# Patient Record
Sex: Female | Born: 1960 | Race: Black or African American | Hispanic: No | Marital: Single | Smoking: Light tobacco smoker
Health system: Southern US, Community
[De-identification: ages and names within clinical notes are randomized; demographics above are authoritative.]

## PROBLEM LIST (undated history)

## (undated) DIAGNOSIS — G8929 Other chronic pain: Secondary | ICD-10-CM

## (undated) DIAGNOSIS — M1990 Unspecified osteoarthritis, unspecified site: Secondary | ICD-10-CM

## (undated) DIAGNOSIS — M5136 Other intervertebral disc degeneration, lumbar region: Secondary | ICD-10-CM

## (undated) DIAGNOSIS — I10 Essential (primary) hypertension: Secondary | ICD-10-CM

## (undated) DIAGNOSIS — E119 Type 2 diabetes mellitus without complications: Secondary | ICD-10-CM

## (undated) DIAGNOSIS — K859 Acute pancreatitis without necrosis or infection, unspecified: Secondary | ICD-10-CM

## (undated) DIAGNOSIS — R350 Frequency of micturition: Secondary | ICD-10-CM

## (undated) DIAGNOSIS — G4730 Sleep apnea, unspecified: Secondary | ICD-10-CM

## (undated) DIAGNOSIS — F419 Anxiety disorder, unspecified: Secondary | ICD-10-CM

## (undated) DIAGNOSIS — K759 Inflammatory liver disease, unspecified: Secondary | ICD-10-CM

## (undated) DIAGNOSIS — I2510 Atherosclerotic heart disease of native coronary artery without angina pectoris: Secondary | ICD-10-CM

## (undated) DIAGNOSIS — E079 Disorder of thyroid, unspecified: Secondary | ICD-10-CM

## (undated) DIAGNOSIS — R32 Unspecified urinary incontinence: Secondary | ICD-10-CM

## (undated) DIAGNOSIS — F32A Depression, unspecified: Secondary | ICD-10-CM

## (undated) DIAGNOSIS — R079 Chest pain, unspecified: Secondary | ICD-10-CM

## (undated) DIAGNOSIS — J449 Chronic obstructive pulmonary disease, unspecified: Secondary | ICD-10-CM

## (undated) DIAGNOSIS — R600 Localized edema: Secondary | ICD-10-CM

## (undated) DIAGNOSIS — R011 Cardiac murmur, unspecified: Secondary | ICD-10-CM

## (undated) DIAGNOSIS — Q210 Ventricular septal defect: Secondary | ICD-10-CM

## (undated) DIAGNOSIS — A6000 Herpesviral infection of urogenital system, unspecified: Secondary | ICD-10-CM

## (undated) DIAGNOSIS — E785 Hyperlipidemia, unspecified: Secondary | ICD-10-CM

## (undated) DIAGNOSIS — R12 Heartburn: Secondary | ICD-10-CM

## (undated) DIAGNOSIS — F329 Major depressive disorder, single episode, unspecified: Secondary | ICD-10-CM

## (undated) DIAGNOSIS — R3129 Other microscopic hematuria: Secondary | ICD-10-CM

## (undated) DIAGNOSIS — I519 Heart disease, unspecified: Secondary | ICD-10-CM

## (undated) DIAGNOSIS — E11319 Type 2 diabetes mellitus with unspecified diabetic retinopathy without macular edema: Secondary | ICD-10-CM

## (undated) DIAGNOSIS — G629 Polyneuropathy, unspecified: Secondary | ICD-10-CM

## (undated) DIAGNOSIS — M549 Dorsalgia, unspecified: Secondary | ICD-10-CM

## (undated) DIAGNOSIS — M51369 Other intervertebral disc degeneration, lumbar region without mention of lumbar back pain or lower extremity pain: Secondary | ICD-10-CM

## (undated) DIAGNOSIS — N189 Chronic kidney disease, unspecified: Secondary | ICD-10-CM

## (undated) DIAGNOSIS — L0100 Impetigo, unspecified: Secondary | ICD-10-CM

## (undated) DIAGNOSIS — J45909 Unspecified asthma, uncomplicated: Secondary | ICD-10-CM

## (undated) DIAGNOSIS — Z9981 Dependence on supplemental oxygen: Secondary | ICD-10-CM

## (undated) HISTORY — DX: Heartburn: R12

## (undated) HISTORY — DX: Polyneuropathy, unspecified: G62.9

## (undated) HISTORY — DX: Disorder of thyroid, unspecified: E07.9

## (undated) HISTORY — DX: Cardiac murmur, unspecified: R01.1

## (undated) HISTORY — DX: Chronic kidney disease, unspecified: N18.9

## (undated) HISTORY — DX: Inflammatory liver disease, unspecified: K75.9

## (undated) HISTORY — DX: Depression, unspecified: F32.A

## (undated) HISTORY — PX: CARDIAC SURGERY: SHX584

## (undated) HISTORY — DX: Chest pain, unspecified: R07.9

## (undated) HISTORY — DX: Hyperlipidemia, unspecified: E78.5

## (undated) HISTORY — DX: Unspecified urinary incontinence: R32

## (undated) HISTORY — DX: Anxiety disorder, unspecified: F41.9

## (undated) HISTORY — DX: Other intervertebral disc degeneration, lumbar region: M51.36

## (undated) HISTORY — PX: WRIST SURGERY: SHX841

## (undated) HISTORY — DX: Type 2 diabetes mellitus without complications: E11.9

## (undated) HISTORY — DX: Other intervertebral disc degeneration, lumbar region without mention of lumbar back pain or lower extremity pain: M51.369

## (undated) HISTORY — DX: Ventricular septal defect: Q21.0

## (undated) HISTORY — DX: Major depressive disorder, single episode, unspecified: F32.9

## (undated) HISTORY — PX: CYST EXCISION: SHX5701

## (undated) HISTORY — DX: Impetigo, unspecified: L01.00

## (undated) HISTORY — DX: Heart disease, unspecified: I51.9

## (undated) HISTORY — DX: Localized edema: R60.0

## (undated) HISTORY — DX: Morbid (severe) obesity due to excess calories: E66.01

## (undated) HISTORY — DX: Dorsalgia, unspecified: M54.9

## (undated) HISTORY — DX: Other microscopic hematuria: R31.29

## (undated) HISTORY — DX: Frequency of micturition: R35.0

## (undated) HISTORY — DX: Unspecified osteoarthritis, unspecified site: M19.90

## (undated) HISTORY — PX: LUMBAR DISC SURGERY: SHX700

## (undated) HISTORY — DX: Herpesviral infection of urogenital system, unspecified: A60.00

## (undated) HISTORY — DX: Other chronic pain: G89.29

## (undated) HISTORY — DX: Type 2 diabetes mellitus with unspecified diabetic retinopathy without macular edema: E11.319

## (undated) HISTORY — DX: Atherosclerotic heart disease of native coronary artery without angina pectoris: I25.10

---

## 2001-06-01 DIAGNOSIS — I10 Essential (primary) hypertension: Secondary | ICD-10-CM | POA: Insufficient documentation

## 2003-10-05 ENCOUNTER — Other Ambulatory Visit: Payer: Self-pay

## 2003-11-02 ENCOUNTER — Other Ambulatory Visit: Payer: Self-pay

## 2003-11-03 ENCOUNTER — Other Ambulatory Visit: Payer: Self-pay

## 2004-02-09 ENCOUNTER — Emergency Department: Payer: Self-pay | Admitting: Emergency Medicine

## 2004-02-09 IMAGING — CT CT HEAD WITHOUT CONTRAST
1 series · 16 of 27 positions shown, 20 images · non-contrast
Comparison: none

REASON FOR EXAM: Headache
COMMENTS:

[Series 2: without · axial · non-contrast · 0.38mm/px · z∈[+830,+950]mm · 16 of 27 slices shown, 20 images]
[im 2/27  brain]
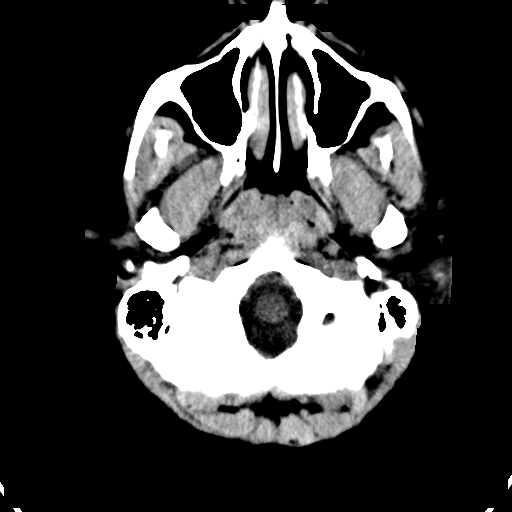
[im 2/27  bone]
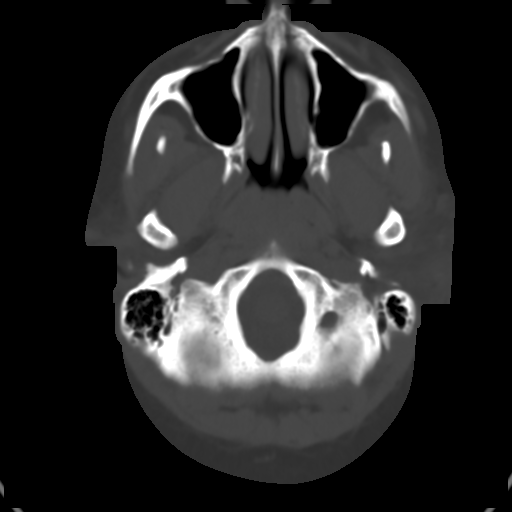
[im 4/27  brain]
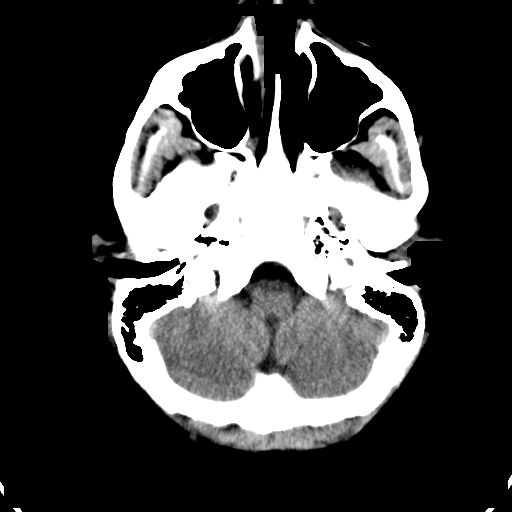
[im 5/27  brain]
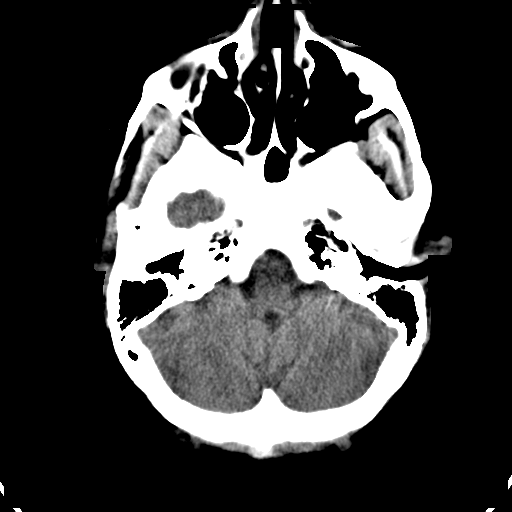
[im 7/27  brain]
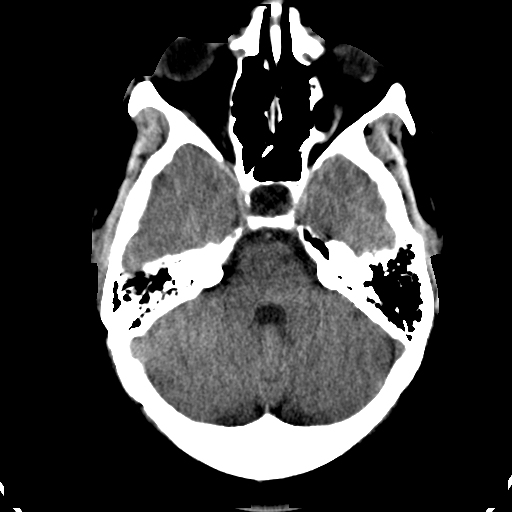
[im 9/27  brain]
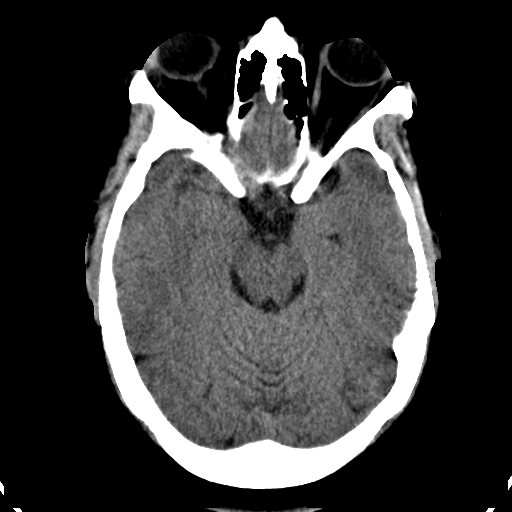
[im 9/27  bone]
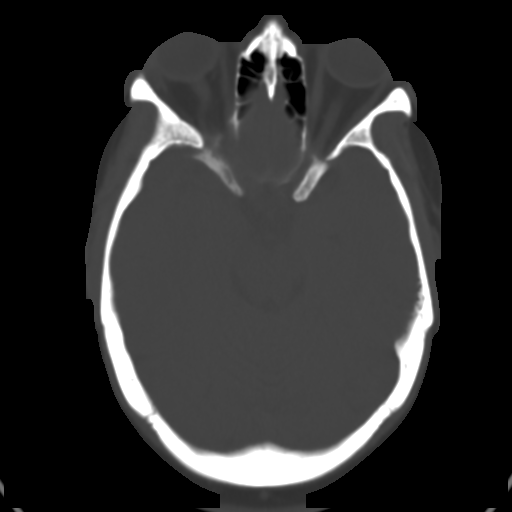
[im 10/27  brain]
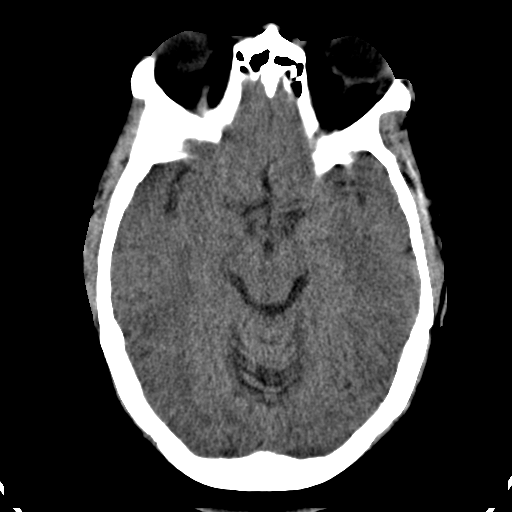
[im 12/27  brain]
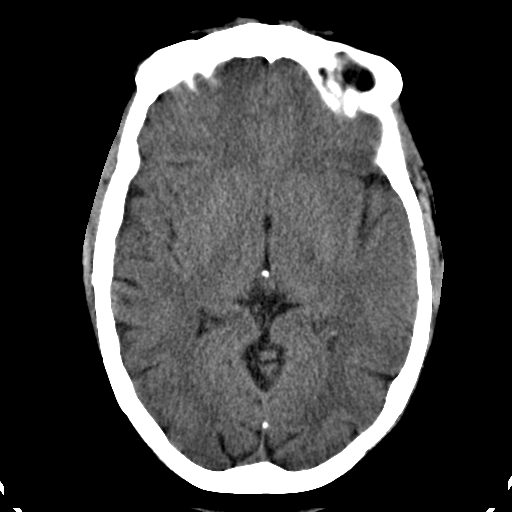
[im 13/27  brain]
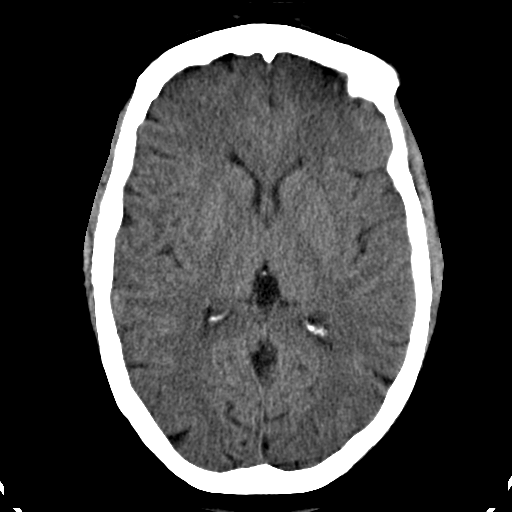
[im 15/27  brain]
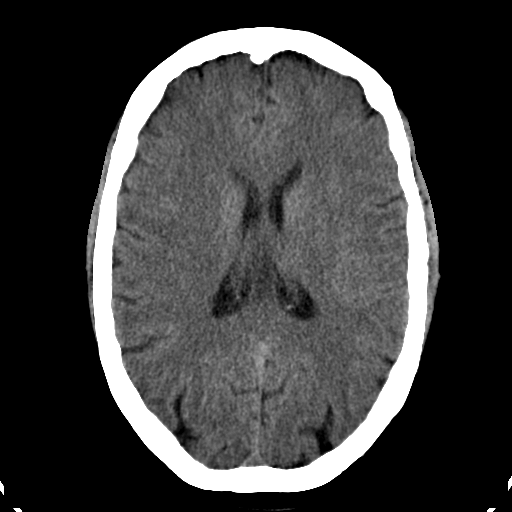
[im 15/27  bone]
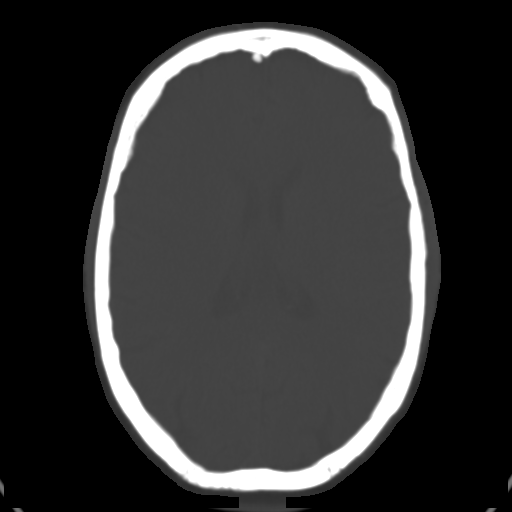
[im 16/27  brain]
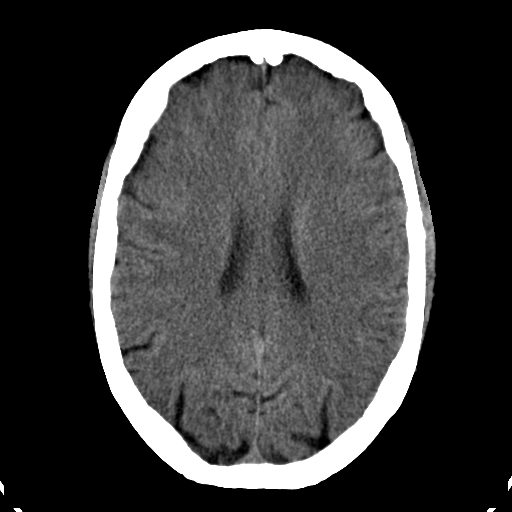
[im 18/27  brain]
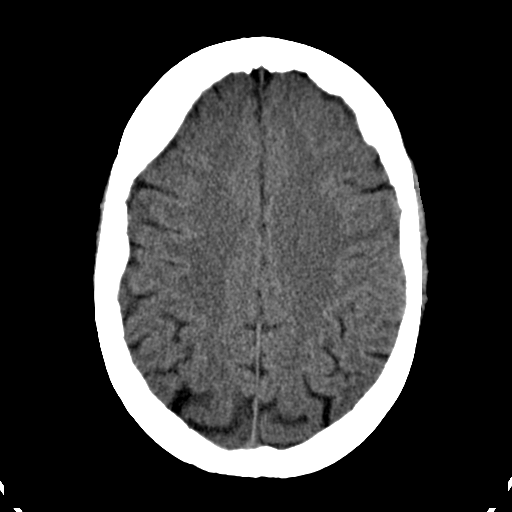
[im 19/27  brain]
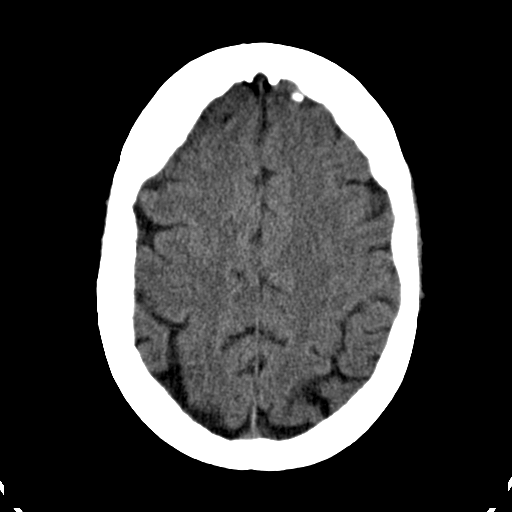
[im 21/27  brain]
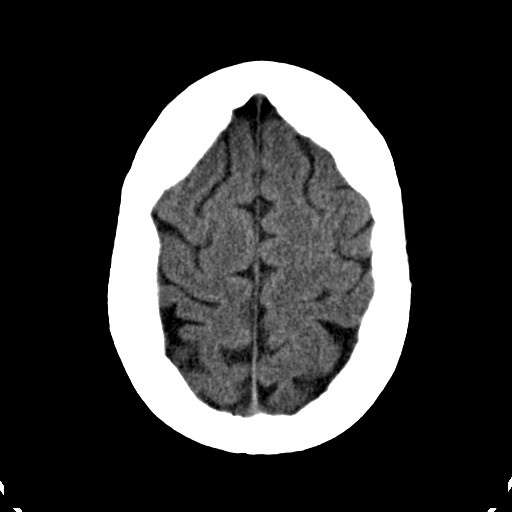
[im 21/27  bone]
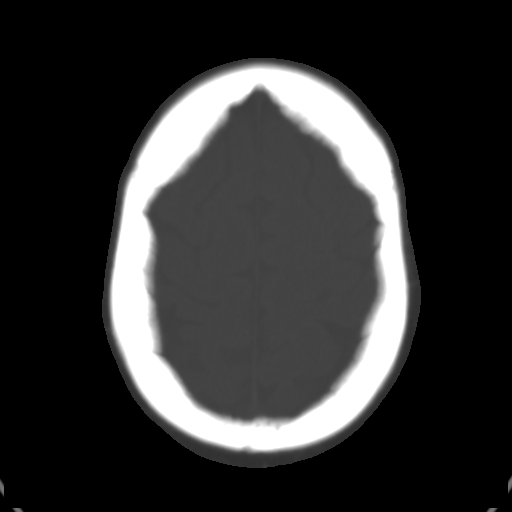
[im 23/27  brain]
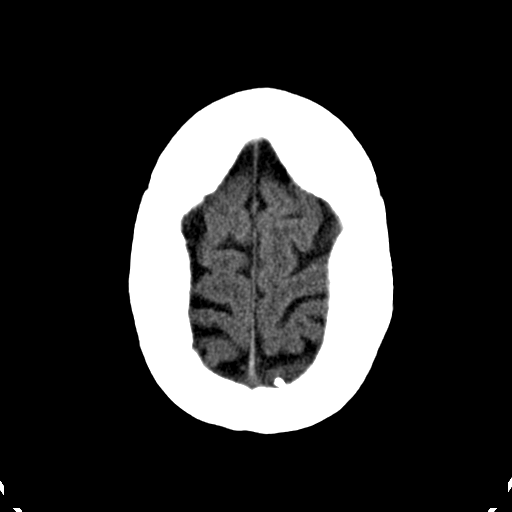
[im 24/27  brain]
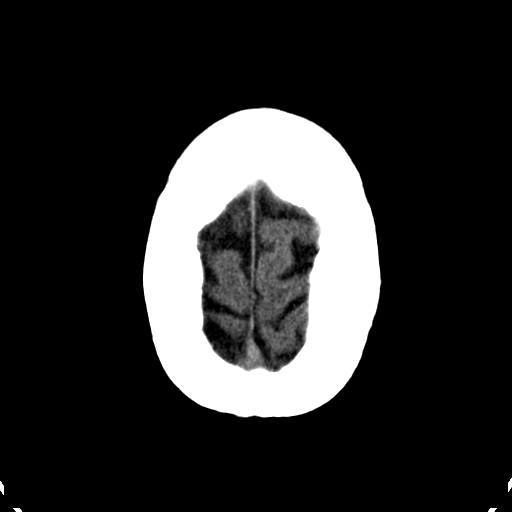
[im 26/27  brain]
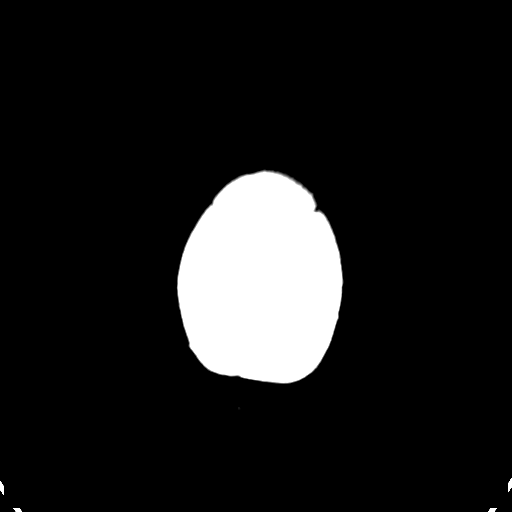

[16 of 27 positions shown; findings below may reference images not displayed]

PROCEDURE:     CT  - CT HEAD WITHOUT CONTRAST  - [DATE]  [DATE]

RESULT:     Noncontrast emergent CT scan of the brain is compared to the
prior similar study dated [DATE].  The ventricles and sulci appear to be
within normal limits.  There is no evidence of hemorrhage.  There is no mass
effect or midline shift.  There is no extraaxial hematoma.  The visualized
paranasal sinuses are normally aerated.
IMPRESSION: No CT evidence of an acute intracranial abnormality.  Findings were phoned
to the [HOSPITAL] the completion of the study.

## 2004-03-10 ENCOUNTER — Emergency Department: Payer: Self-pay | Admitting: Unknown Physician Specialty

## 2004-03-11 ENCOUNTER — Other Ambulatory Visit: Payer: Self-pay

## 2004-06-26 ENCOUNTER — Emergency Department: Payer: Self-pay | Admitting: Unknown Physician Specialty

## 2004-08-26 ENCOUNTER — Emergency Department: Payer: Self-pay | Admitting: Unknown Physician Specialty

## 2004-08-26 ENCOUNTER — Other Ambulatory Visit: Payer: Self-pay

## 2004-08-26 IMAGING — CR DG CHEST 2V
1 series · 2 of 2 positions shown · non-contrast
Comparison: none

REASON FOR EXAM: Facial numbness
COMMENTS:

[Series 1: view not recorded · 0.17mm/px · 2 of 2 slices shown]
[im 1/2]
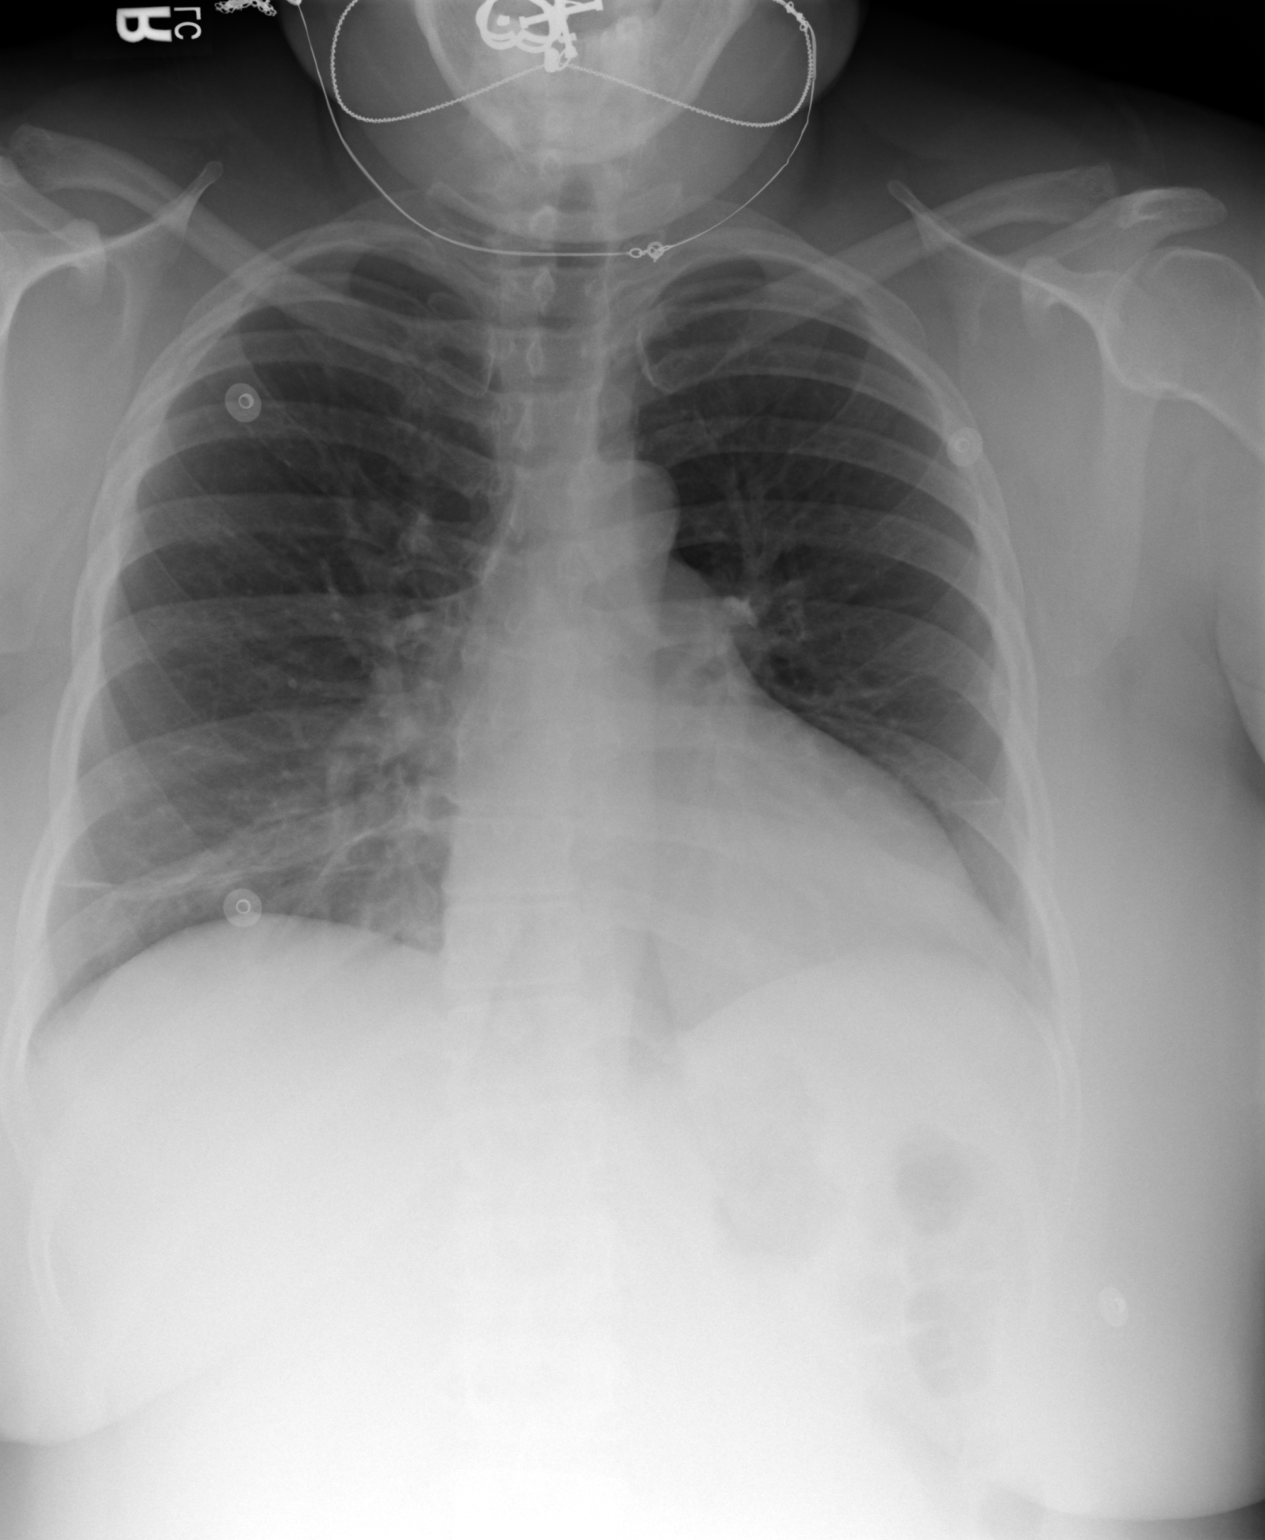
[im 2/2]
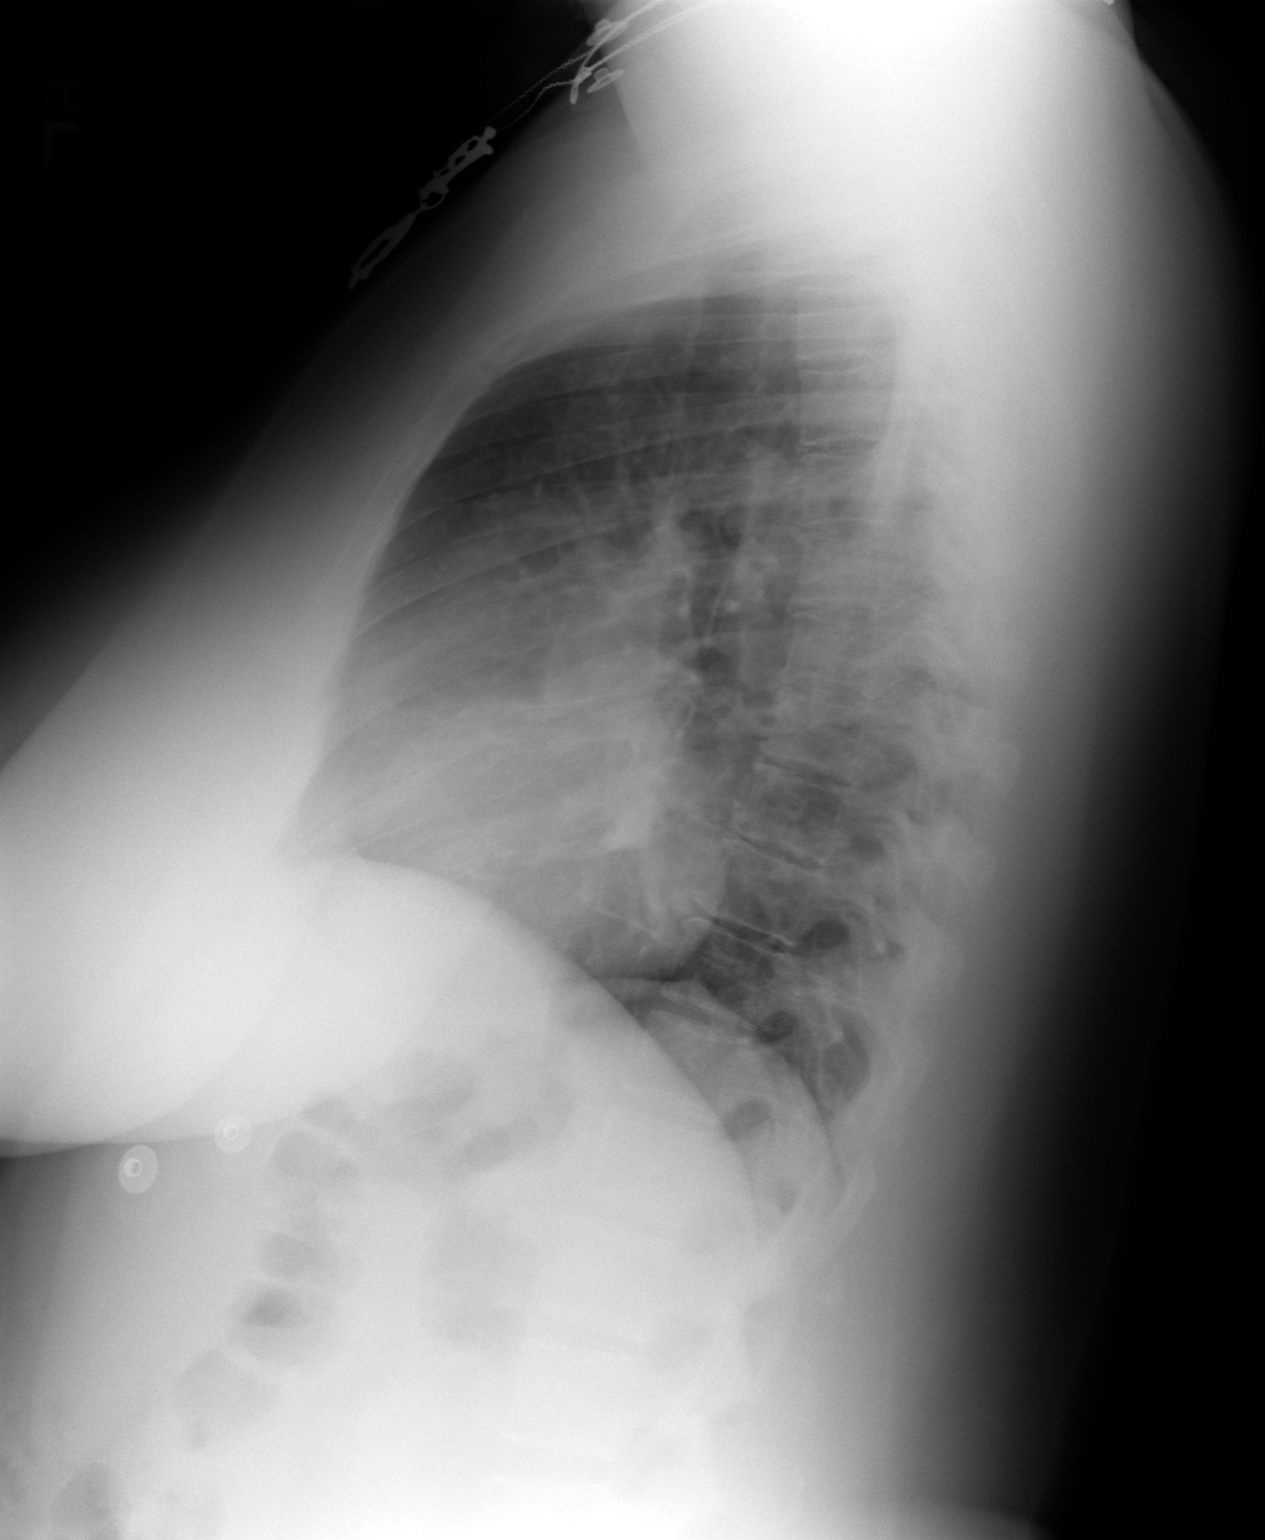

[2 of 2 positions shown; findings below may reference images not displayed]

PROCEDURE:     DXR - DXR CHEST PA (OR AP) AND LATERAL  - [DATE]  [DATE]

RESULT:     PA and lateral views of the chest show new subsegmental
atelectasis in the RIGHT lung base.  I do not see evidence of a focal
pneumonia or pleural effusion.  Lungs are otherwise clear and well expanded.
 Bony structures are normal.
IMPRESSION: When compared to the prior study of [DATE] there has been the development
of subsegmental atelectasis in the RIGHT lung base.  Follow-up study is
recommended.

No focal pneumonia or pleural effusion.

Normal bony structures.

## 2004-08-26 IMAGING — CT CT HEAD WITHOUT CONTRAST
1 series · 16 of 28 positions shown, 20 images · non-contrast
Comparison: none

REASON FOR EXAM: Facial numbness
COMMENTS:

PROCEDURE:     CT  - CT HEAD WITHOUT CONTRAST  - [DATE]  [DATE]
RESULT:     Unenhanced CT scan of the brain was performed and shows no
evidence of acute hemorrhage, midline shift, or mass effect.  There is no
skull fracture or subgaleal hematoma.

[Series 2: without · axial · non-contrast · 0.38mm/px · z∈[+858,+984]mm · 16 of 28 slices shown, 20 images]
[im 2/28  brain]
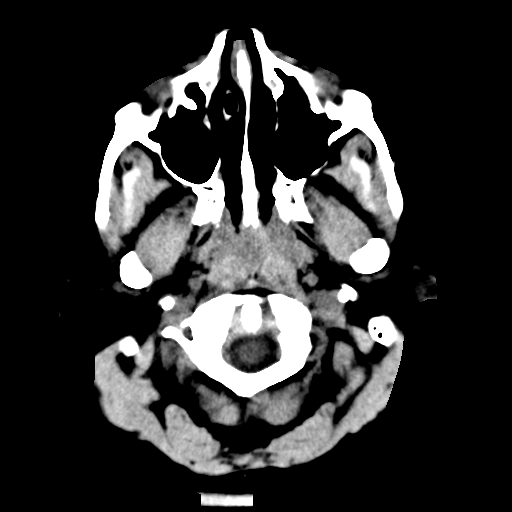
[im 2/28  bone]
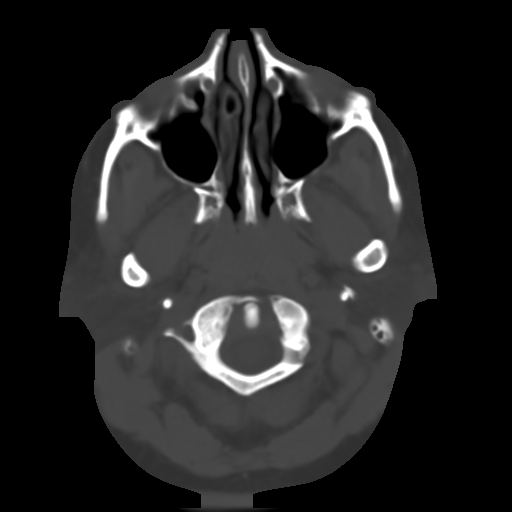
[im 4/28  brain]
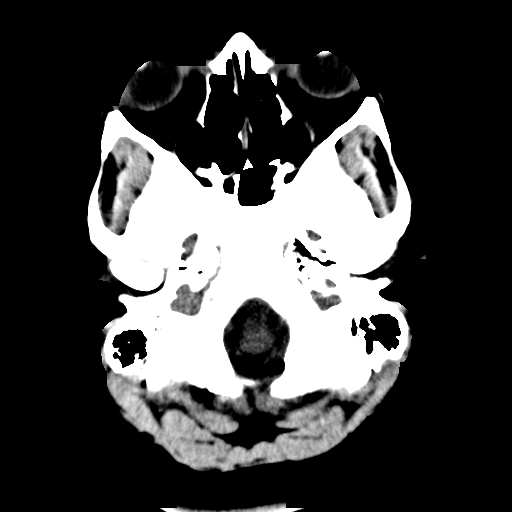
[im 6/28  brain]
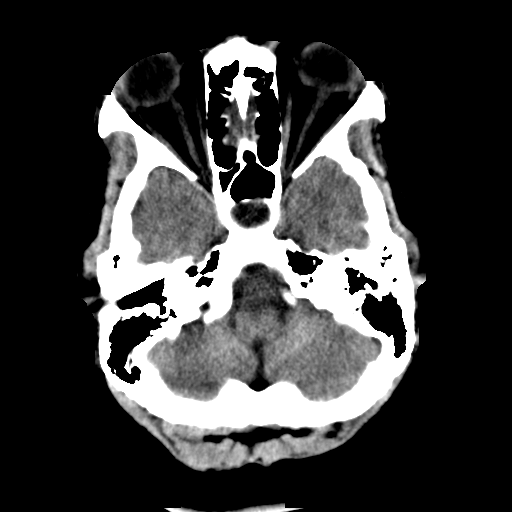
[im 7/28  brain]
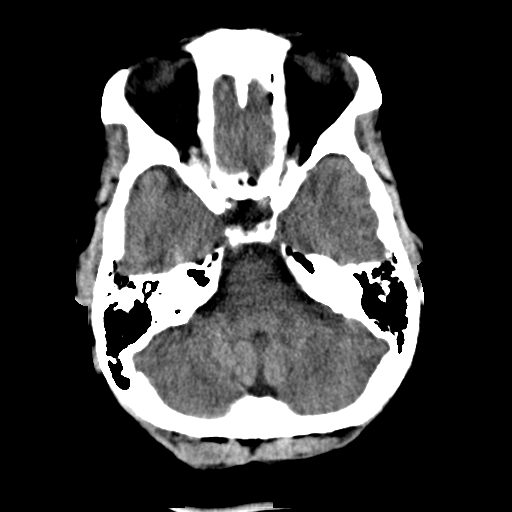
[im 9/28  brain]
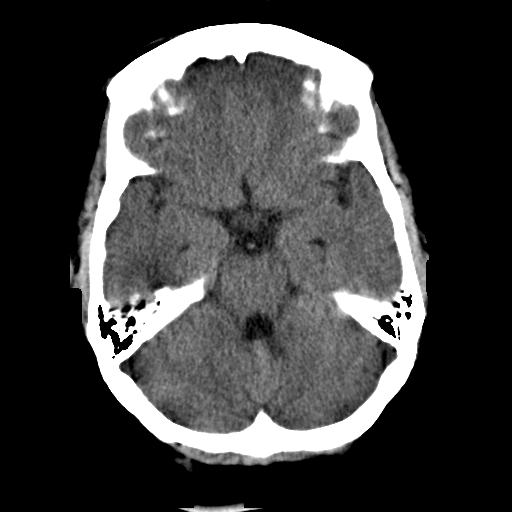
[im 9/28  bone]
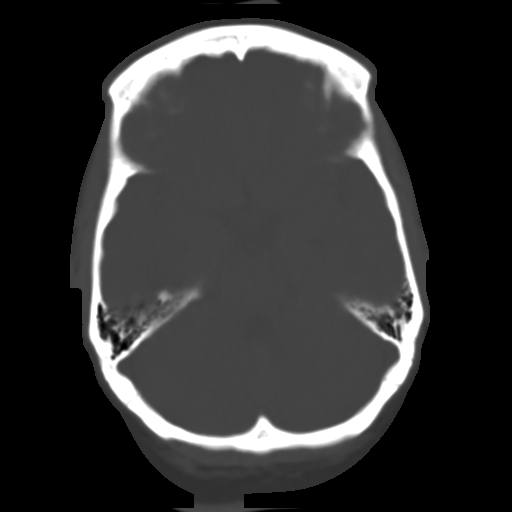
[im 10/28  brain]
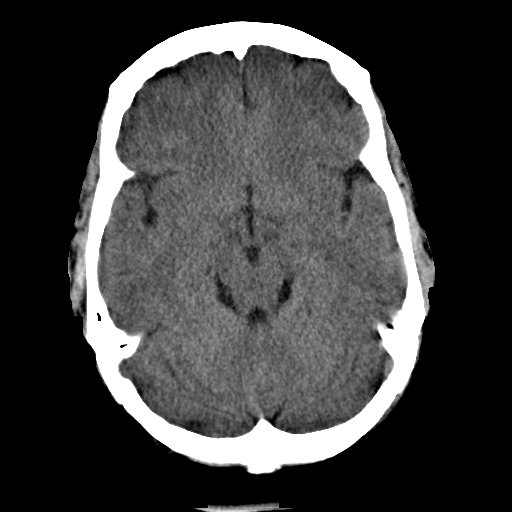
[im 12/28  brain]
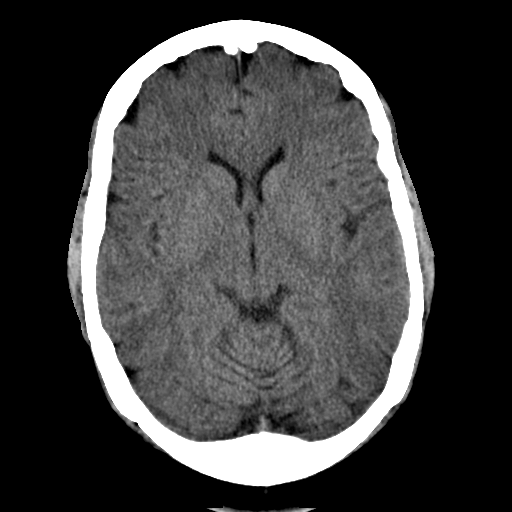
[im 14/28  brain]
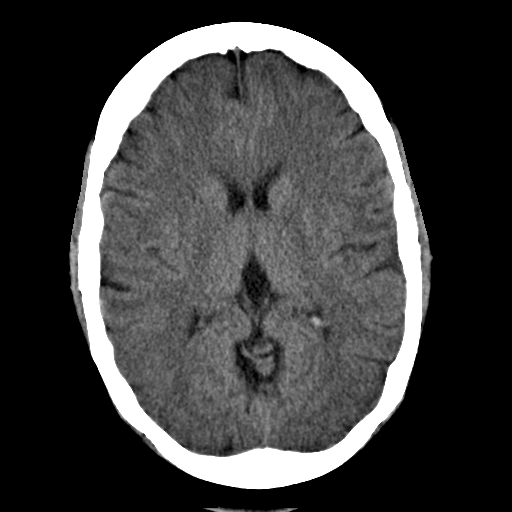
[im 15/28  brain]
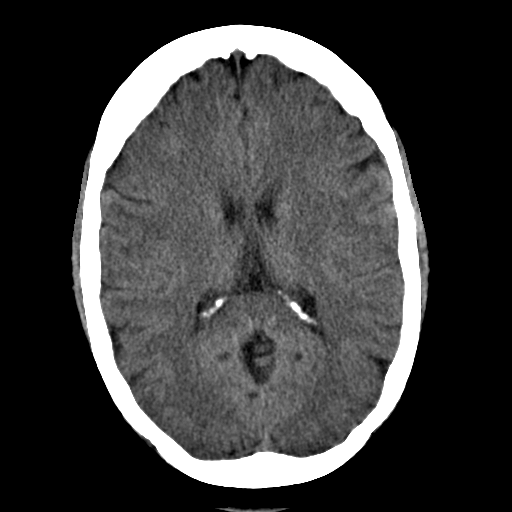
[im 15/28  bone]
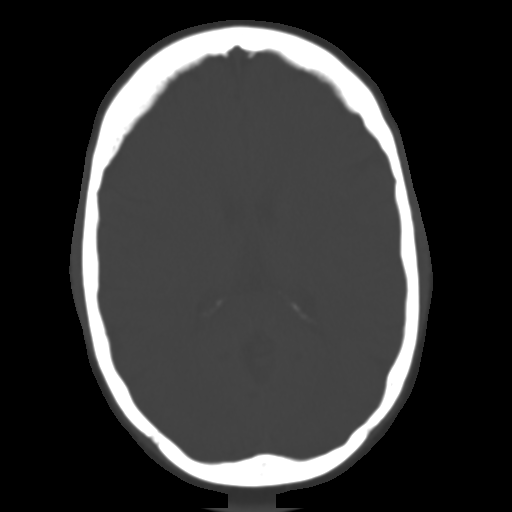
[im 17/28  brain]
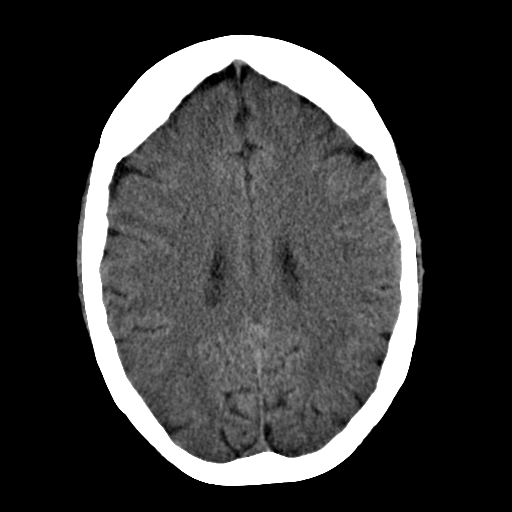
[im 19/28  brain]
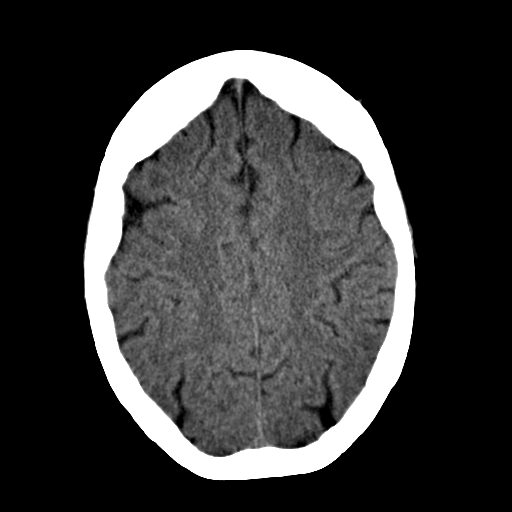
[im 20/28  brain]
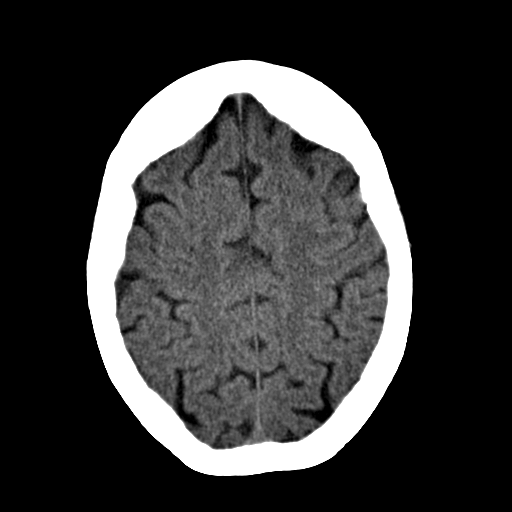
[im 22/28  brain]
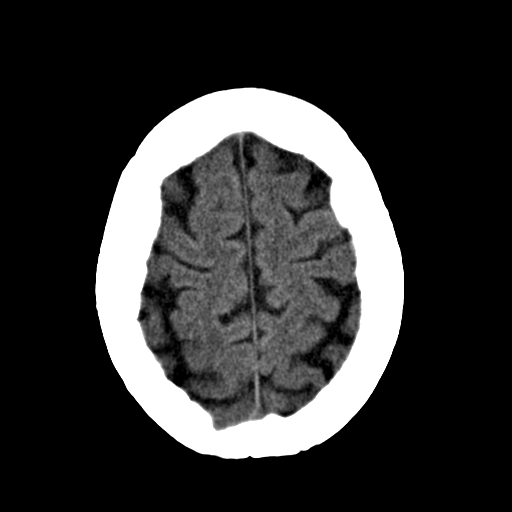
[im 22/28  bone]
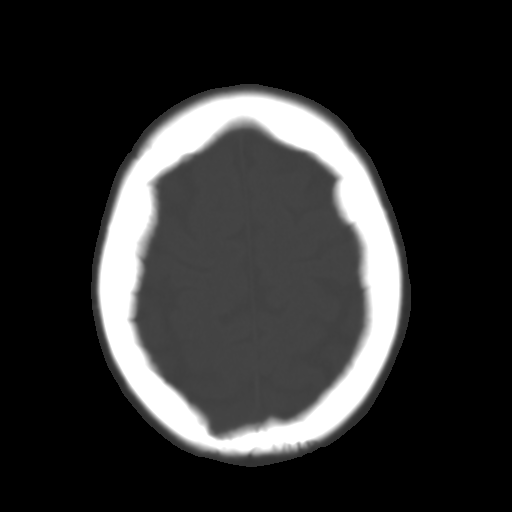
[im 23/28  brain]
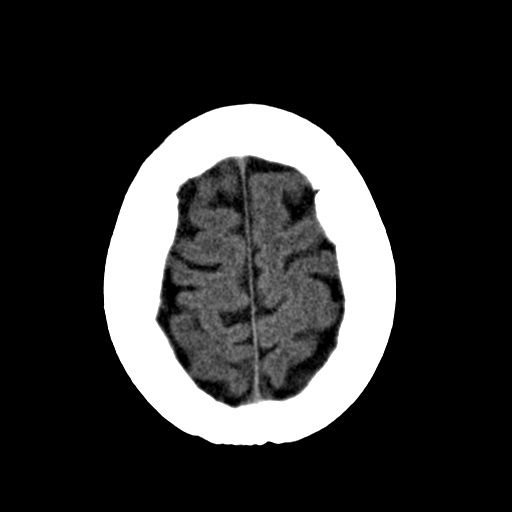
[im 25/28  brain]
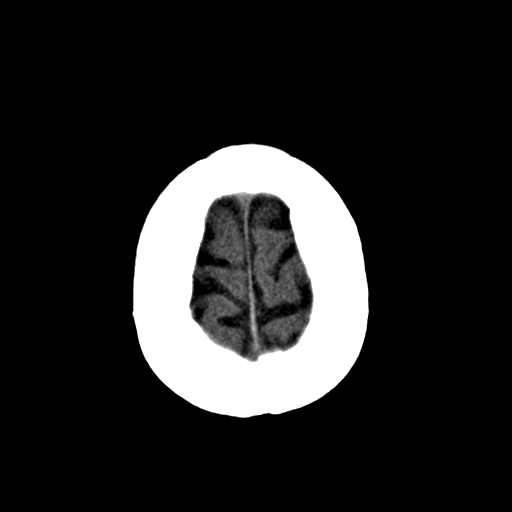
[im 27/28  brain]
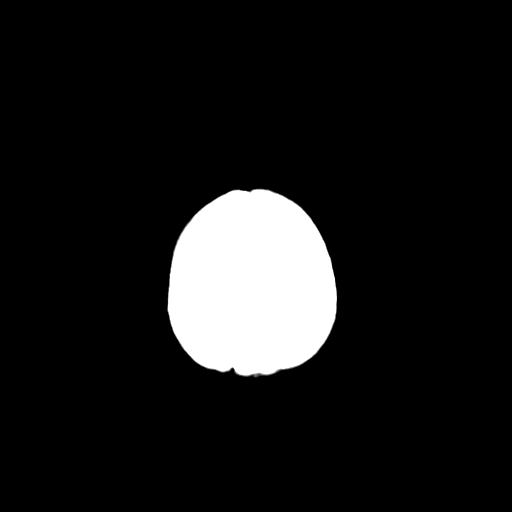

[16 of 28 positions shown; findings below may reference images not displayed]

IMPRESSION: Unremarkable unenhanced CT scan of the brain.  No change since [DATE].

## 2004-09-28 ENCOUNTER — Emergency Department: Payer: Self-pay | Admitting: Emergency Medicine

## 2004-09-28 IMAGING — CT CT ABD-PELV W/O CM
1 of 2 series · 15 of 32 positions shown, 19 images · non-contrast
Comparison: none

REASON FOR EXAM: Abdominal pain
COMMENTS:

[Series 2: stone · axial · 0.64mm/px · z∈[-628,-232]mm · 15 of 144 slices shown, 19 images]
[im 6/144  soft-tissue]
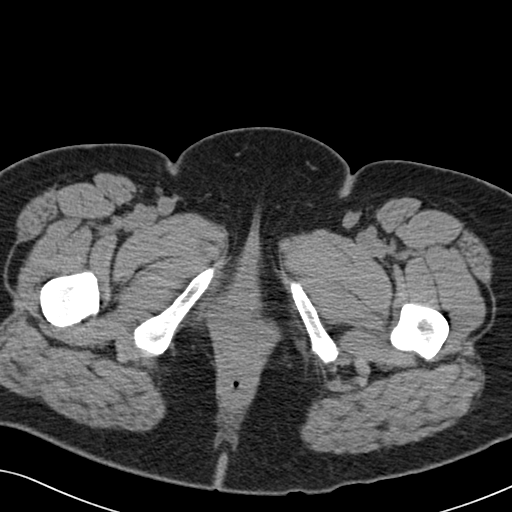
[im 6/144  bone]
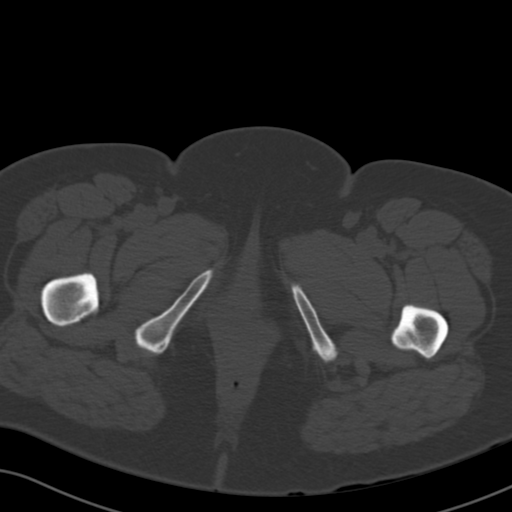
[im 18/144  soft-tissue]
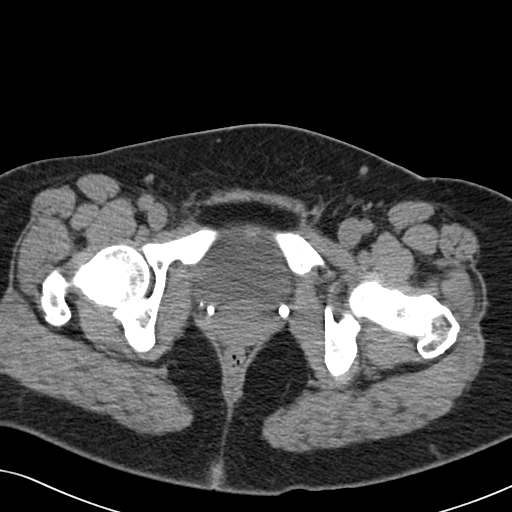
[im 29/144  soft-tissue]
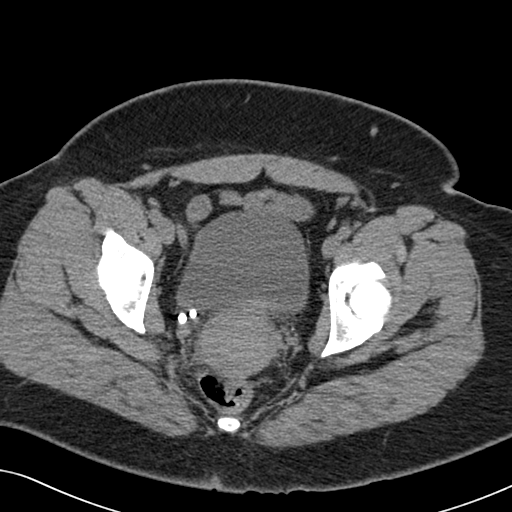
[im 41/144  soft-tissue]
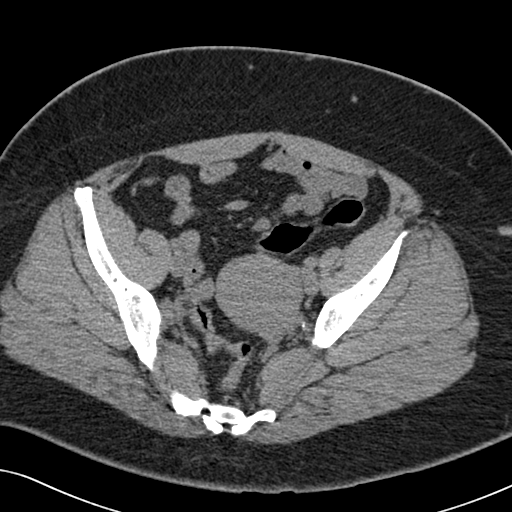
[im 52/144  soft-tissue]
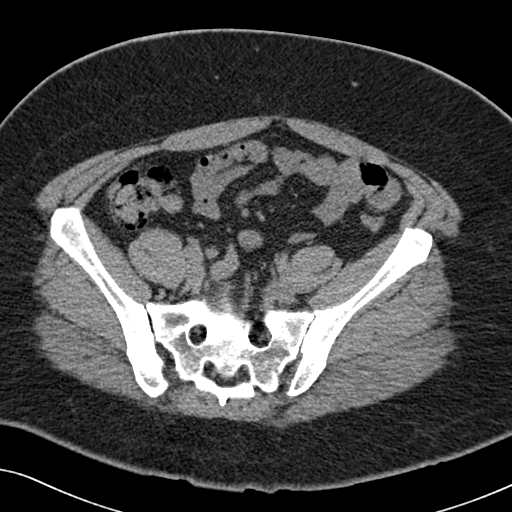
[im 63/144  soft-tissue]
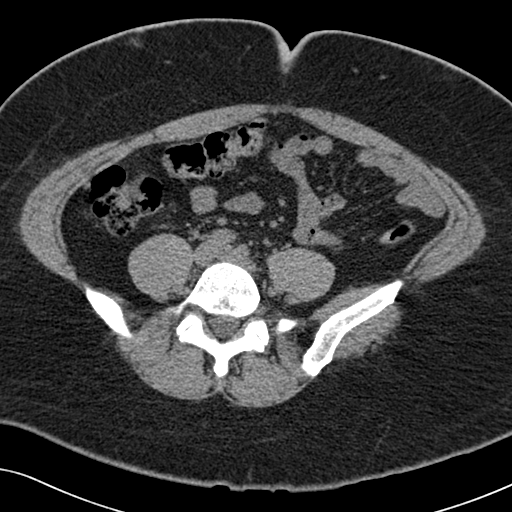
[im 75/144  soft-tissue]
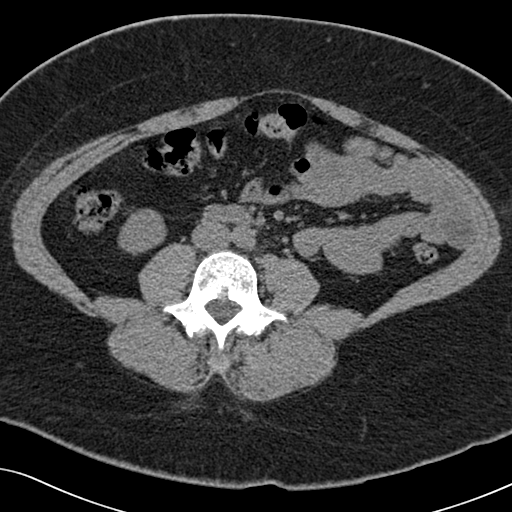
[im 81/144  soft-tissue]
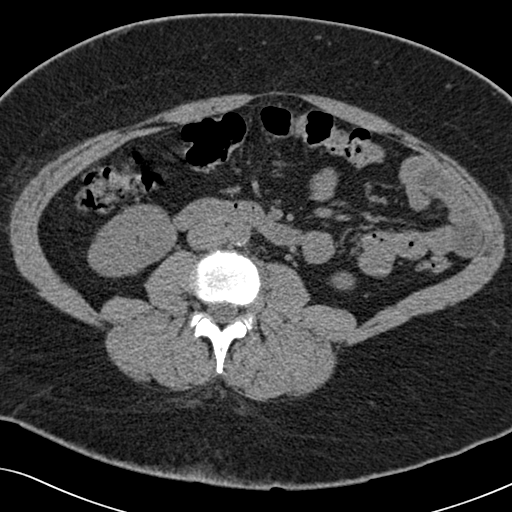
[im 92/144  soft-tissue]
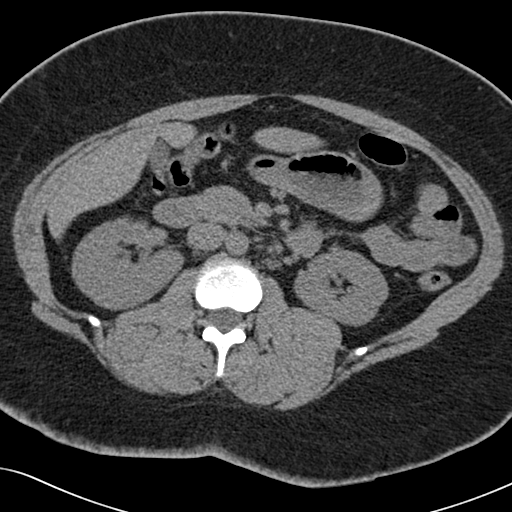
[im 92/144  bone]
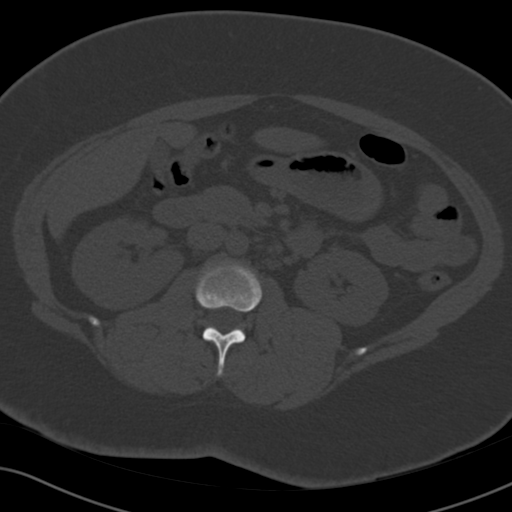
[im 103/144  soft-tissue]
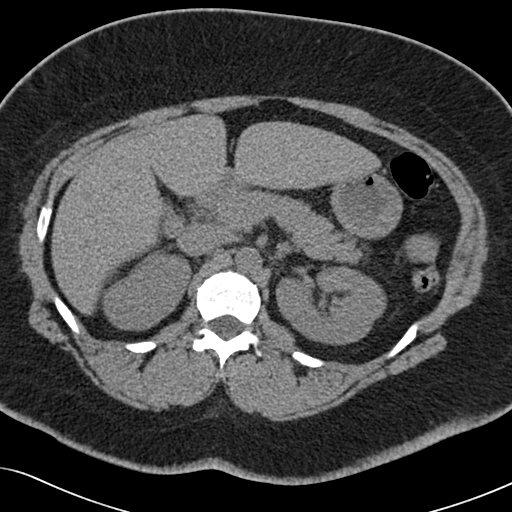
[im 115/144  soft-tissue]
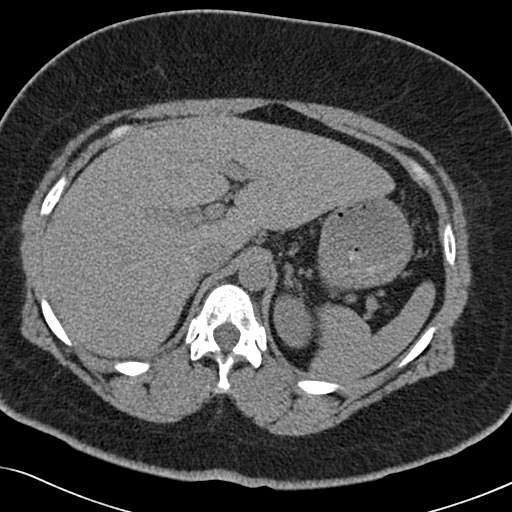
[im 121/144  lung]
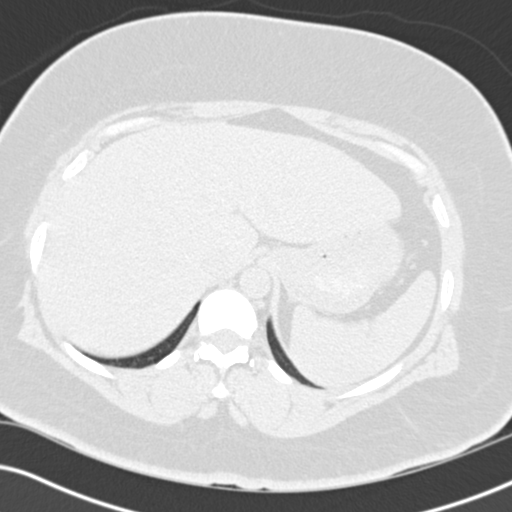
[im 126/144  soft-tissue]
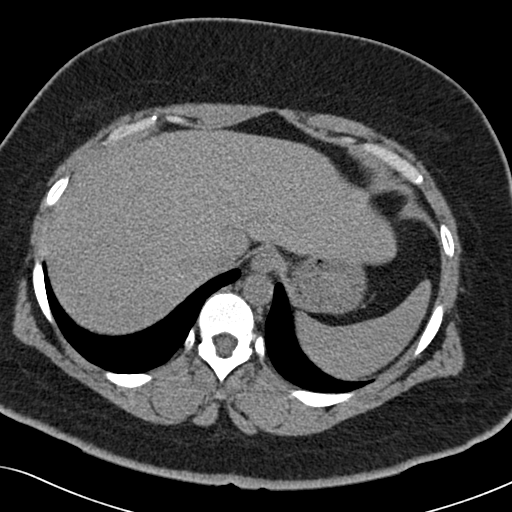
[im 126/144  lung]
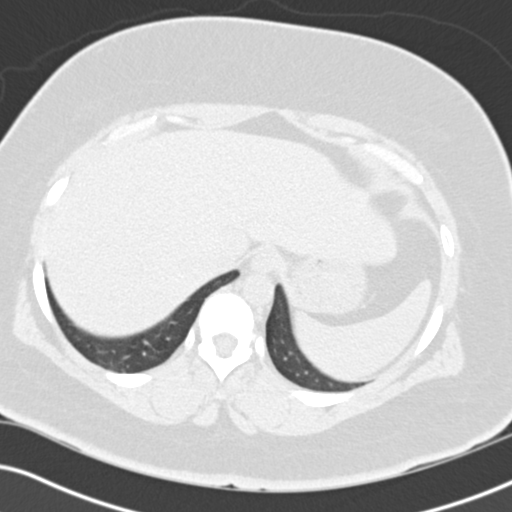
[im 132/144  lung]
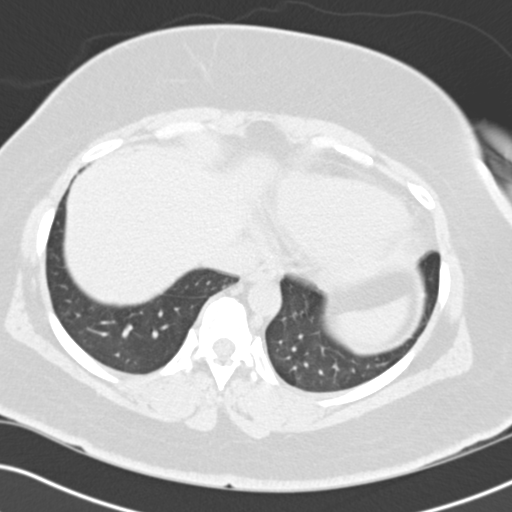
[im 138/144  soft-tissue]
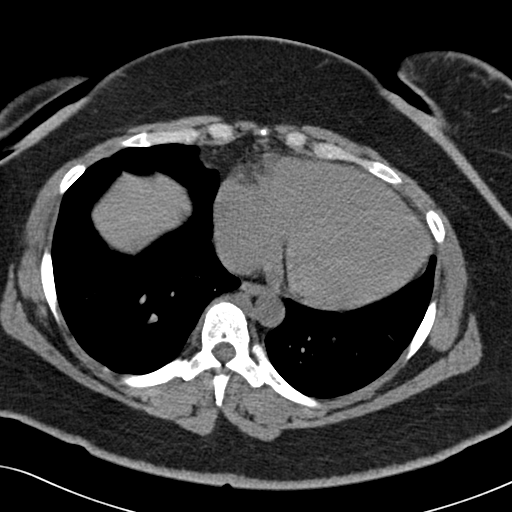
[im 138/144  lung]
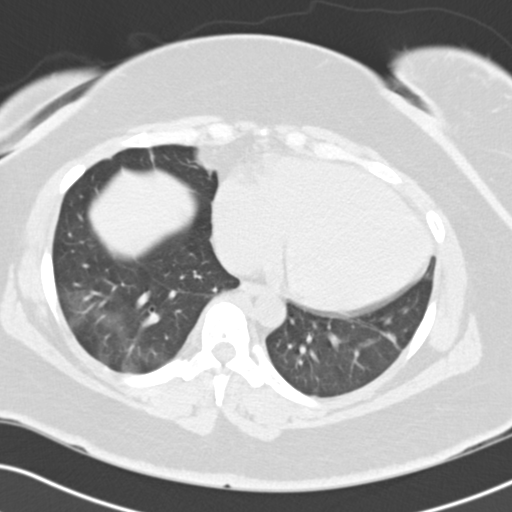

[15 of 32 positions shown; findings below may reference images not displayed]

PROCEDURE:     CT  - CT ABDOMEN AND PELVIS W[DATE] [DATE]

RESULT:     The patient is complaining of low back discomfort and has a
history of stones.

Both RIGHT and LEFT kidneys exhibit normal density and contour. There is no
evidence of hydronephrosis. The perinephric fat is normal in density. Along
the expected course of the ureters, I see no abnormal dilation or evidence
of stones. Within the pelvis numerous phleboliths are present. The urinary
bladder is normal in contour and is partially distended. The uterus and
adnexal structures are grossly normal for this noncontrast study. I see no
free fluid within the pelvis. The sigmoid colon and rectum exhibit no acute
abnormality. There is a small, periumbilical hernia containing peritoneal
fat but no bowel loops. The unopacified loops of small and large bowel
exhibit no acute abnormality.

The liver, gallbladder, nondistended stomach, spleen, pancreas and adrenal
glands exhibit no acute abnormality. The periaortic and pericaval regions
are normal in appearance. At the lung bases, the cardiac chambers are seen
to be top normal in size. The lungs themselves exhibit no acute abnormality
in the lower lobes where visualized.
IMPRESSION: 1.     I do not see evidence of calcified urinary tract stones nor evidence
of urinary tract obstruction. There is no indirect evidence of inflammatory
change of the urinary tracts.
2.     I do not see acute abnormality elsewhere within the abdomen or
pelvis. A small periumbilical hernia is present which contains mesenteric
fat but no bowel loops. No free fluid is identified within the abdomen or
pelvis.

The findings were called to the Emergency Room at the conclusion of the
study.

## 2005-01-27 ENCOUNTER — Emergency Department: Payer: Self-pay | Admitting: Emergency Medicine

## 2005-02-11 ENCOUNTER — Emergency Department: Payer: Self-pay | Admitting: Emergency Medicine

## 2005-02-24 ENCOUNTER — Other Ambulatory Visit: Payer: Self-pay

## 2005-02-24 ENCOUNTER — Emergency Department: Payer: Self-pay | Admitting: Emergency Medicine

## 2005-03-17 ENCOUNTER — Emergency Department: Payer: Self-pay | Admitting: Emergency Medicine

## 2005-04-05 ENCOUNTER — Emergency Department: Payer: Self-pay | Admitting: Emergency Medicine

## 2005-04-05 IMAGING — CR DG KNEE COMPLETE 4+V*L*
1 series · 4 of 4 positions shown · non-contrast
Comparison: none

REASON FOR EXAM: Injury
COMMENTS:

[Series 1: view not recorded · 0.17mm/px · 4 of 4 slices shown]
[im 1/4]
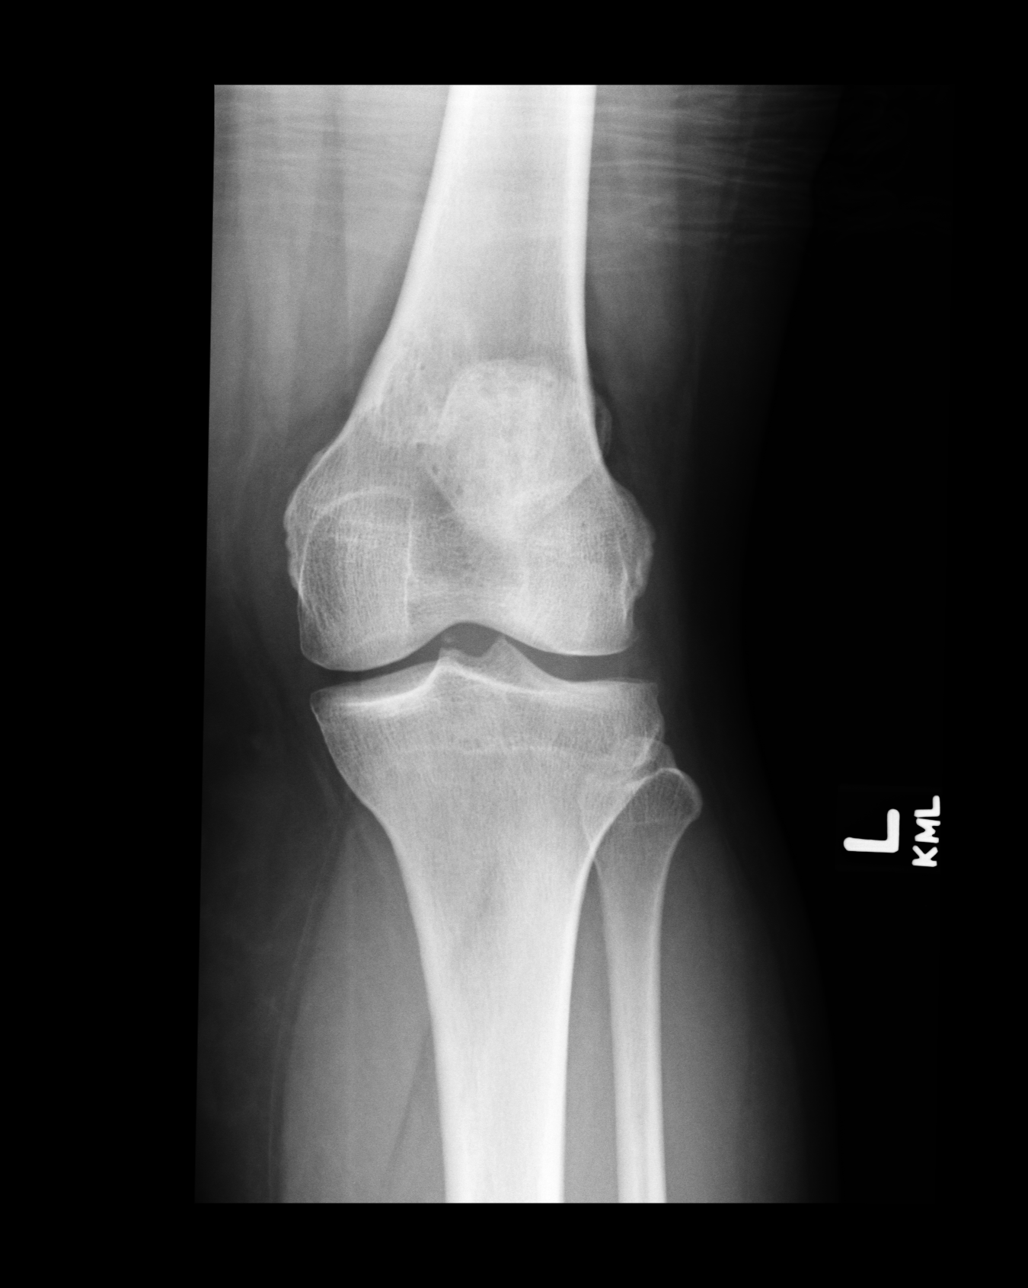
[im 2/4]
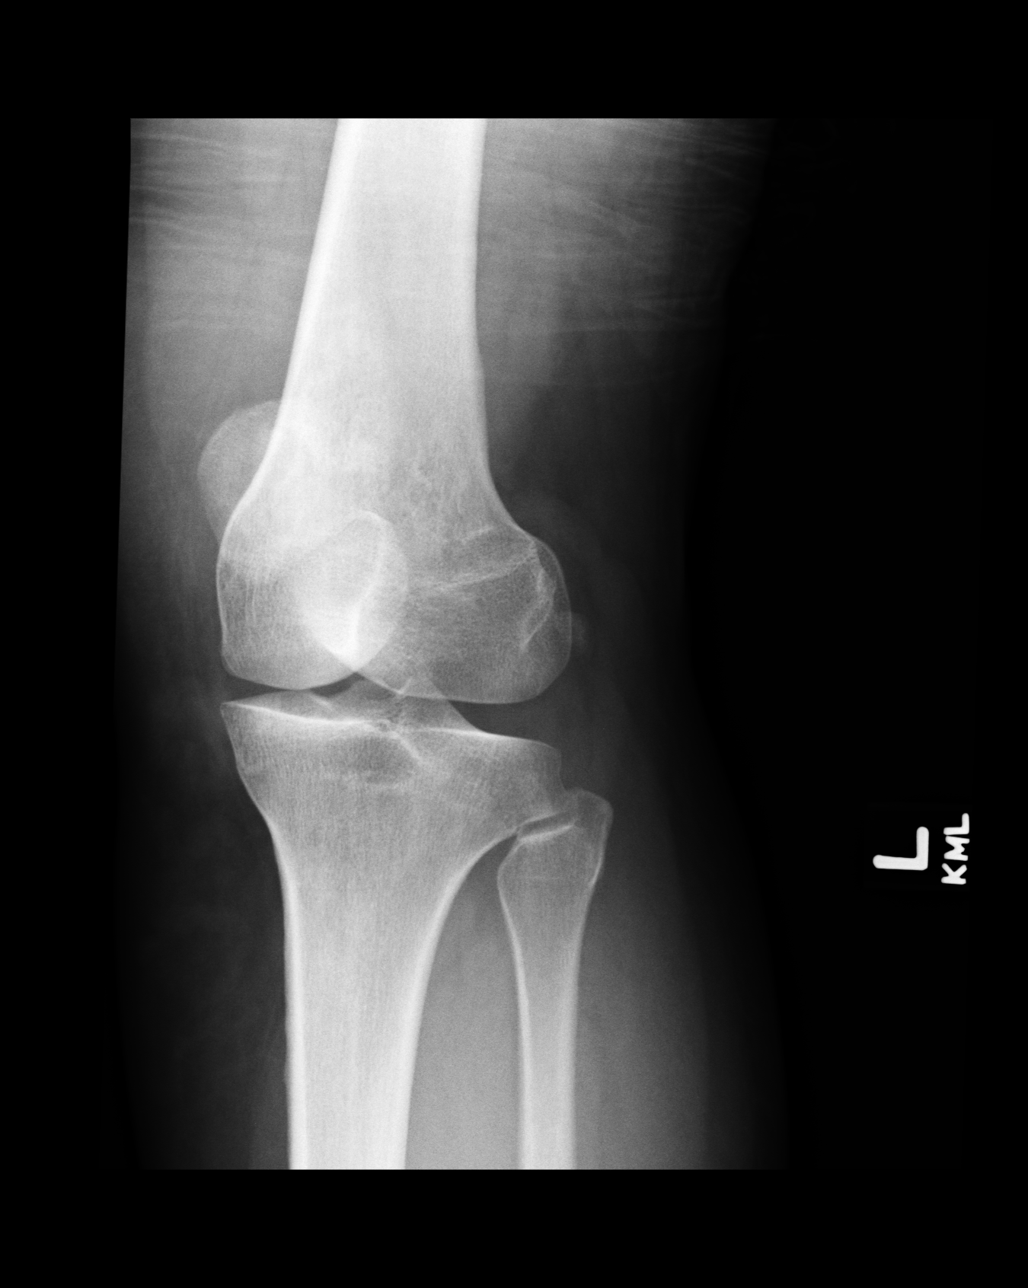
[im 3/4]
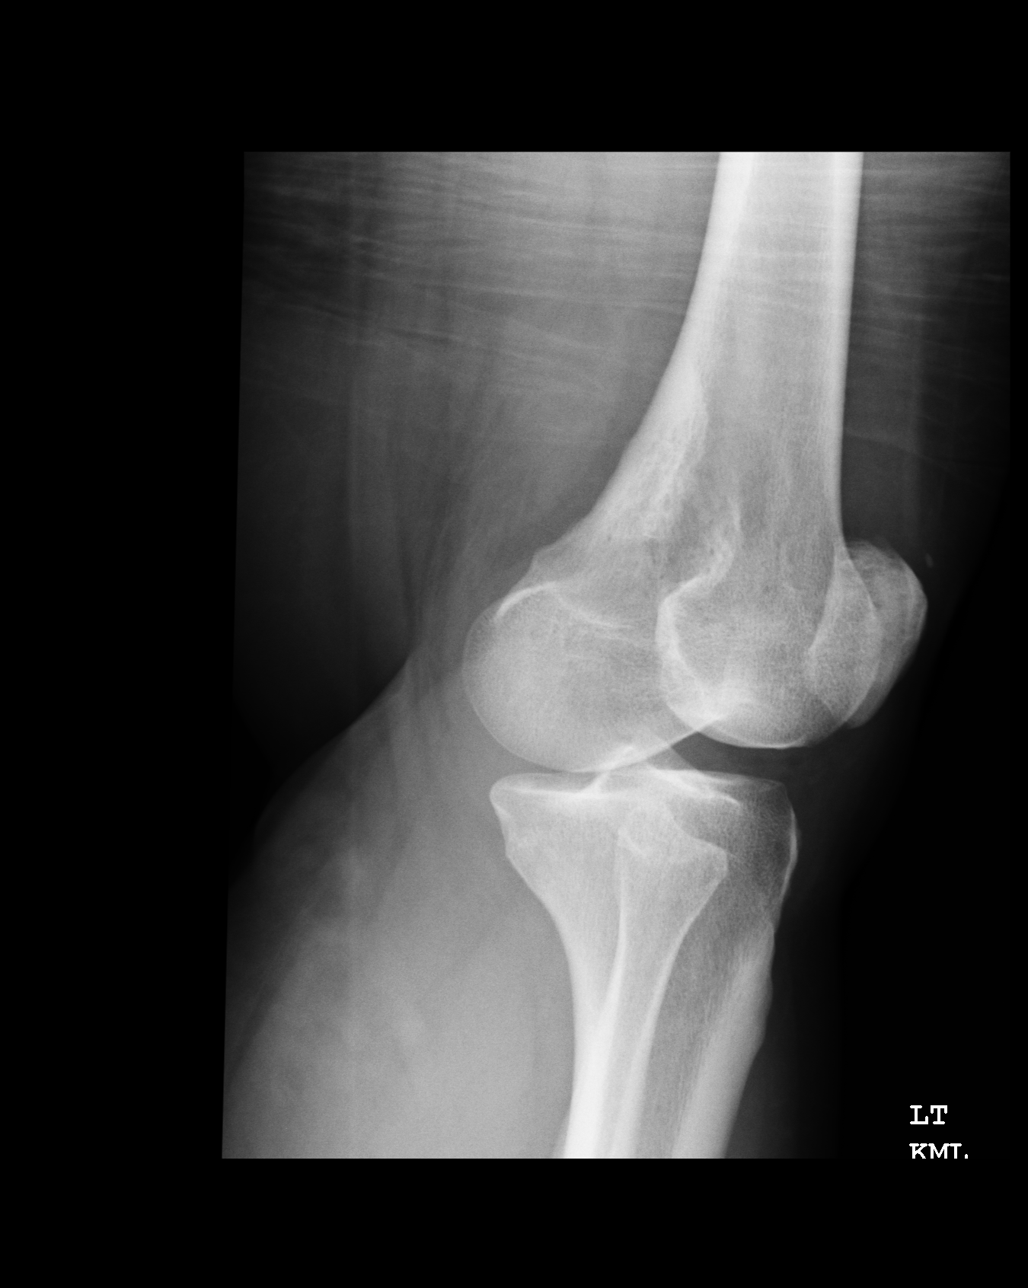
[im 4/4]
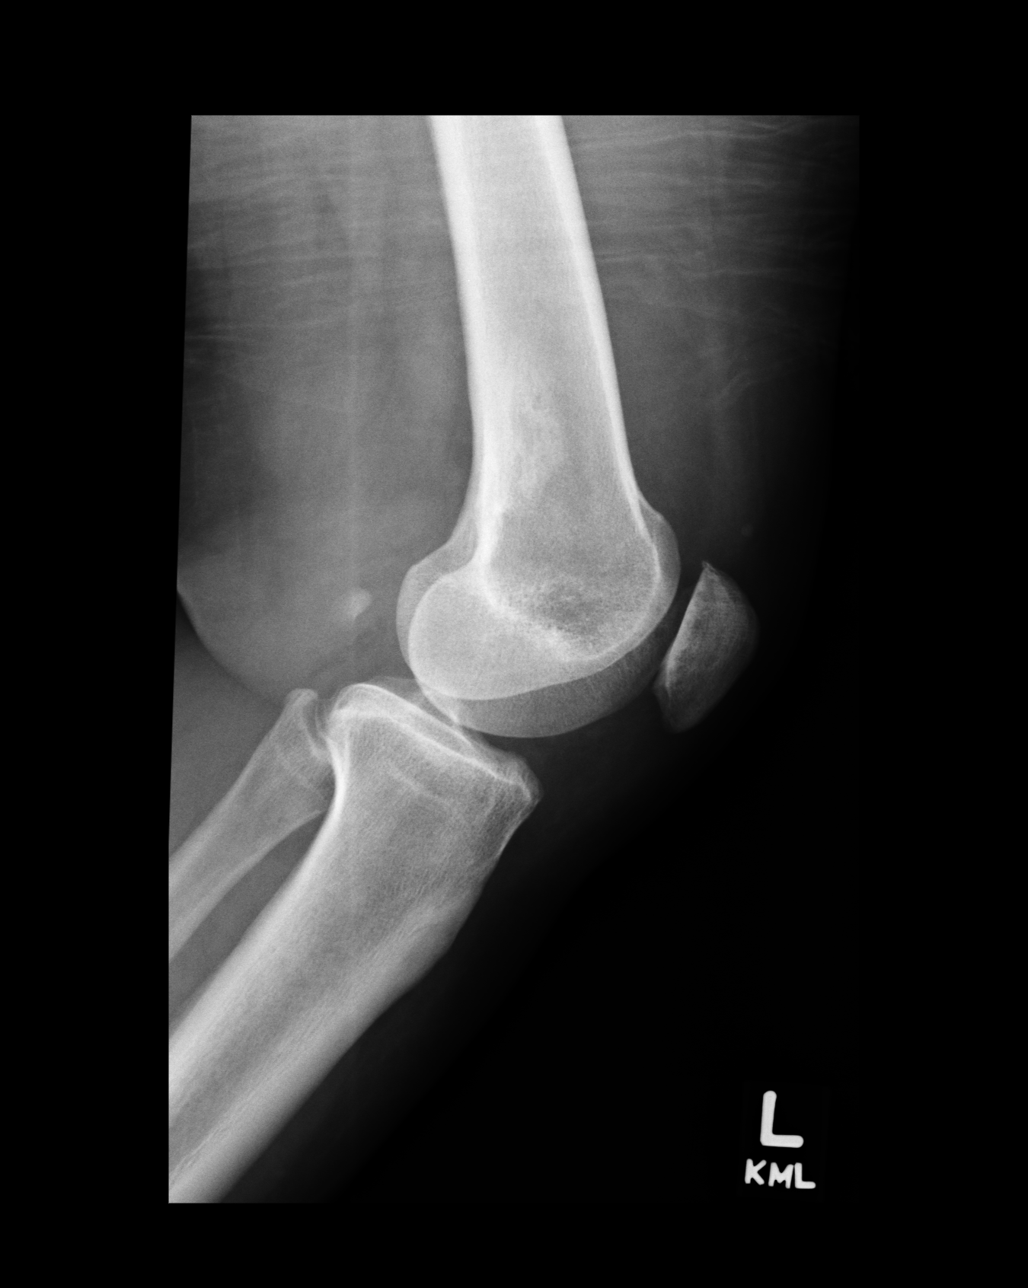

[4 of 4 positions shown; findings below may reference images not displayed]

PROCEDURE:     DXR - DXR KNEE LT COMP WITH OBLIQUES  - [DATE]  [DATE]

RESULT:       Four views of the LEFT knee show no fracture, dislocation or
other acute bony abnormality.   The knee joint space is well maintained.
The patella is intact.  Incidental note is made of a sclerotic area in the
posterior aspect of the distal femur.  This most likely represents residual
change from a nonossifying fibroma that is now filled in.
IMPRESSION: No acute changes are identified.

## 2005-05-29 ENCOUNTER — Emergency Department: Payer: Self-pay | Admitting: Emergency Medicine

## 2005-05-29 IMAGING — CR DG KNEE COMPLETE 4+V*L*
1 series · 5 of 5 positions shown · non-contrast
Comparison: none

REASON FOR EXAM: Fall, pain
COMMENTS:  LMP: N/A

[Series 1: view not recorded · 0.17mm/px · 5 of 5 slices shown]
[im 1/5]
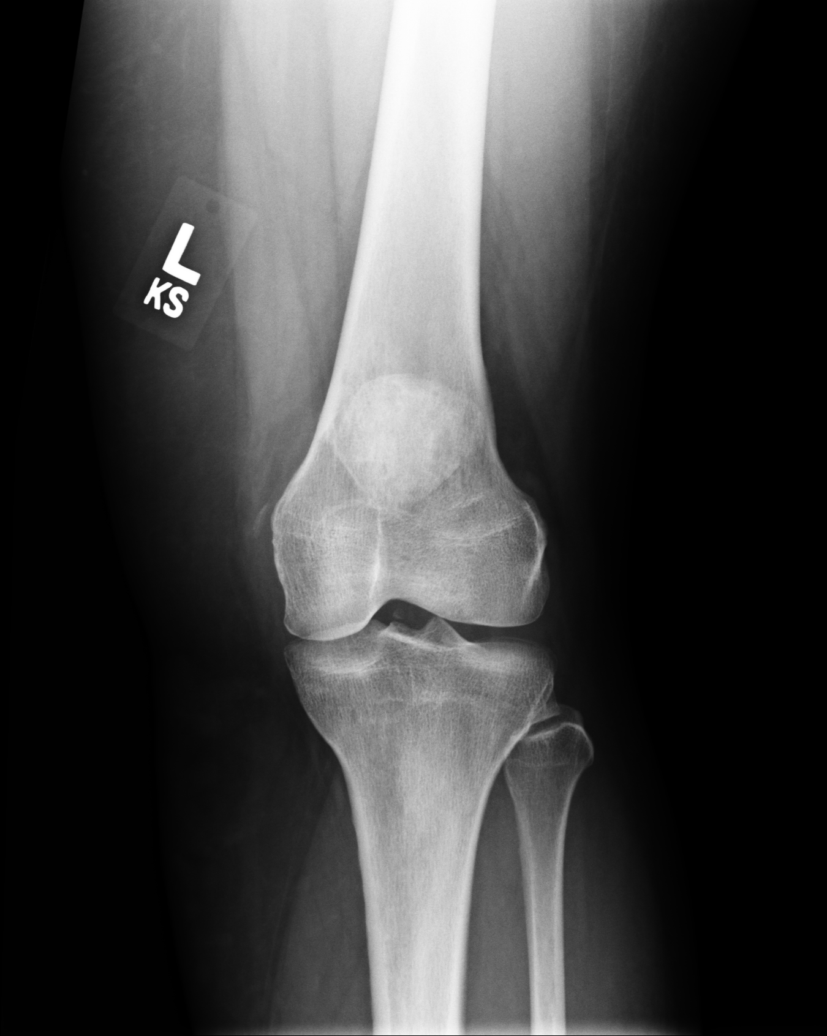
[im 2/5]
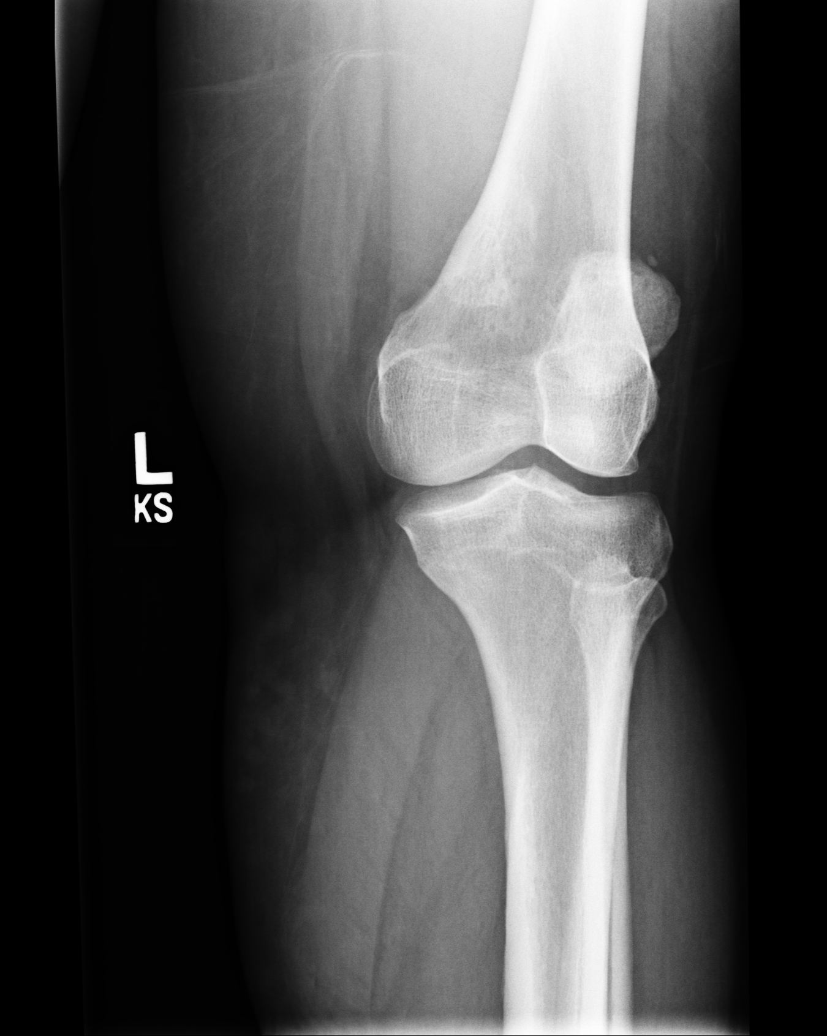
[im 3/5]
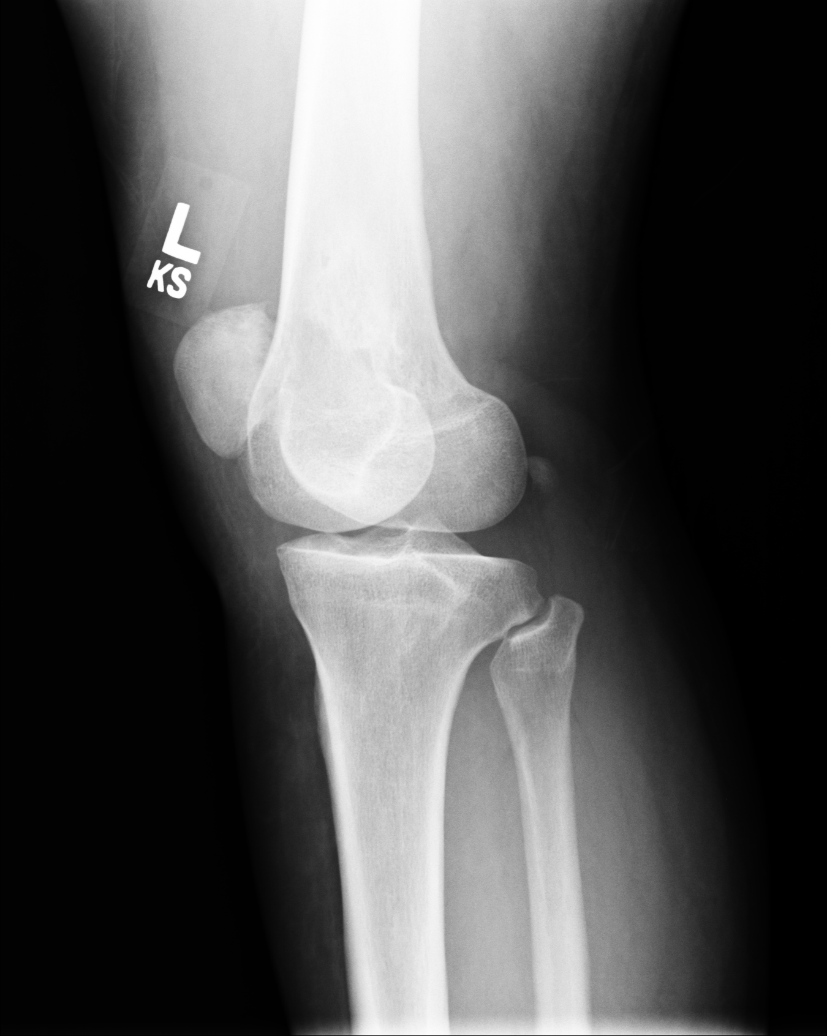
[im 4/5]
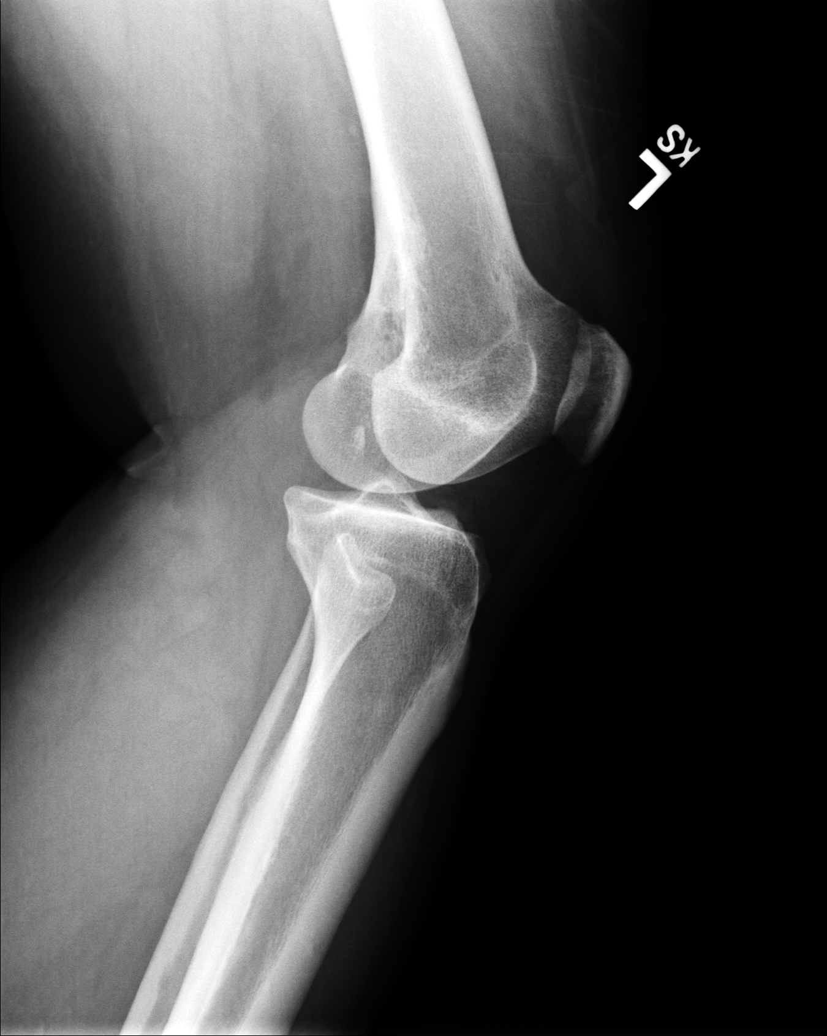
[im 5/5]
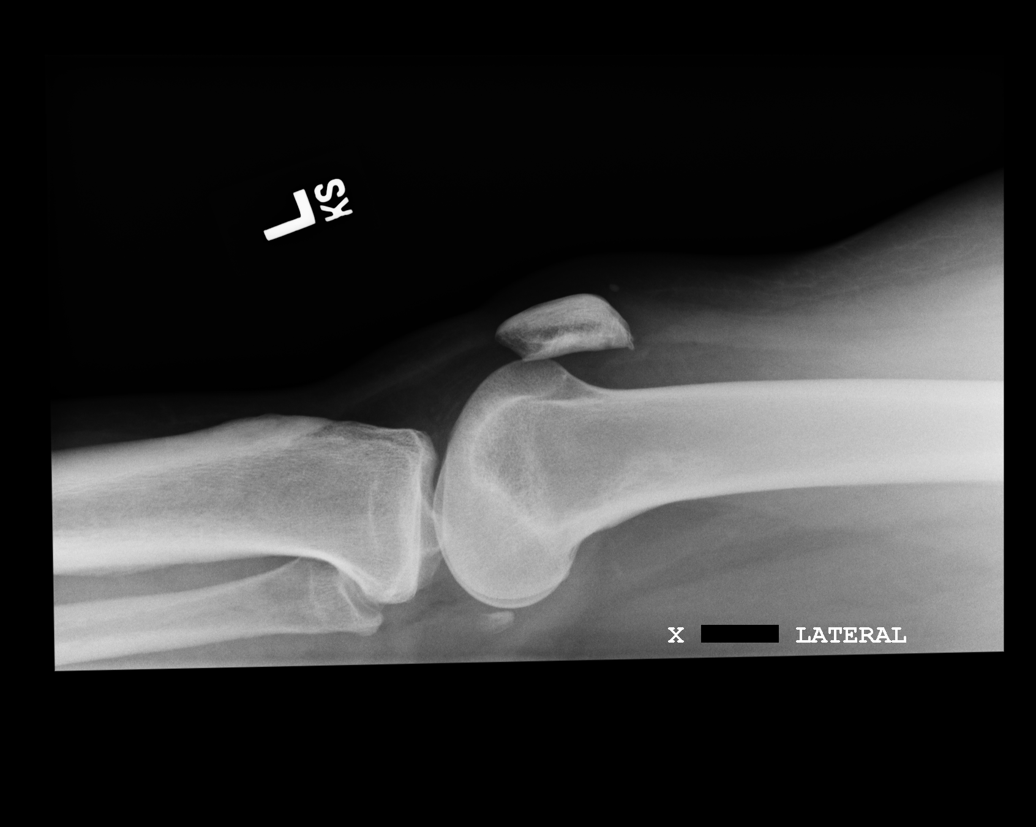

[5 of 5 positions shown; findings below may reference images not displayed]

PROCEDURE:     DXR - DXR KNEE LT COMP WITH OBLIQUES  - [DATE]  [DATE]

RESULT:          Multiple views of the LEFT knee are compared to the study
of [DATE].

There appears to be a joint effusion.  There is some degenerative change,
especially in the patellofemoral joint.  A small fabella is present.  No
definite fracture is identified.  Should the patient's symptoms persist, MRI
could be obtained to evaluate for internal derangement.  Follow up films
could be obtained to evaluate for occult fracture.
IMPRESSION: Please see above.

## 2005-06-07 ENCOUNTER — Inpatient Hospital Stay: Payer: Self-pay | Admitting: Internal Medicine

## 2005-06-07 ENCOUNTER — Other Ambulatory Visit: Payer: Self-pay

## 2005-06-07 IMAGING — CR DG CHEST 1V PORT
1 series · 1 of 1 positions shown · non-contrast
Comparison: none

REASON FOR EXAM: Chest pain
COMMENTS:

[view not recorded]
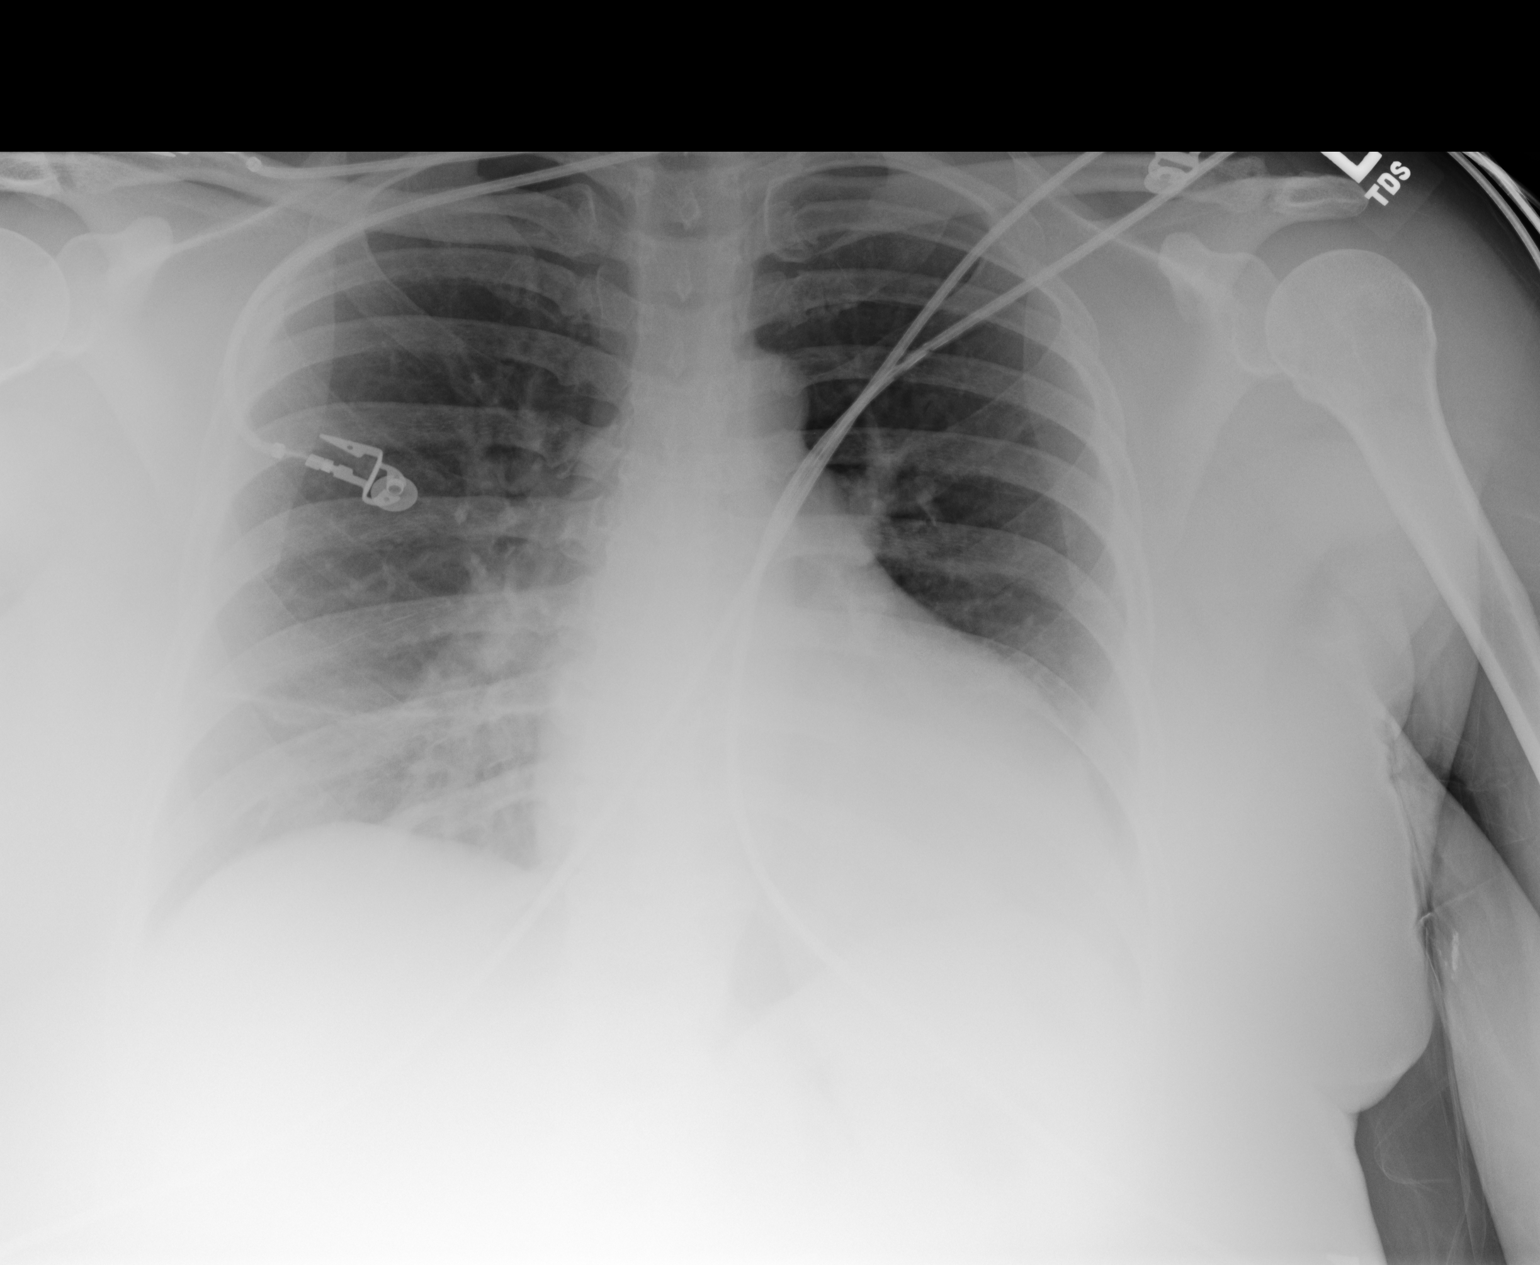

[1 of 1 positions shown; findings below may reference images not displayed]

PROCEDURE:     DXR - DXR PORTABLE CHEST SINGLE VIEW  - [DATE] [DATE]

RESULT:       Portable AP view of the chest is compared to the prior exam of
[DATE].  There is again noted prominence of the minor fissure compatible
with fibrosis or a trace of inner lobar fluid.  Since this was present
previously the finding likely is chronic.  The previously noted atelectasis
at the RIGHT base is no longer definitely seen.  No pneumonia, pneumothorax
or pleural effusion is seen.  The heart size is normal.  Monitoring
electrodes are present.
IMPRESSION: No acute changes are identified.

## 2005-09-04 ENCOUNTER — Emergency Department: Payer: Self-pay | Admitting: Emergency Medicine

## 2005-09-04 IMAGING — CR DG ABDOMEN 3V
1 series · 4 of 4 positions shown · non-contrast
Comparison: none

REASON FOR EXAM: abdominal pain
COMMENTS:

[Series 1: view not recorded · 0.17mm/px · 4 of 4 slices shown]
[im 1/4]
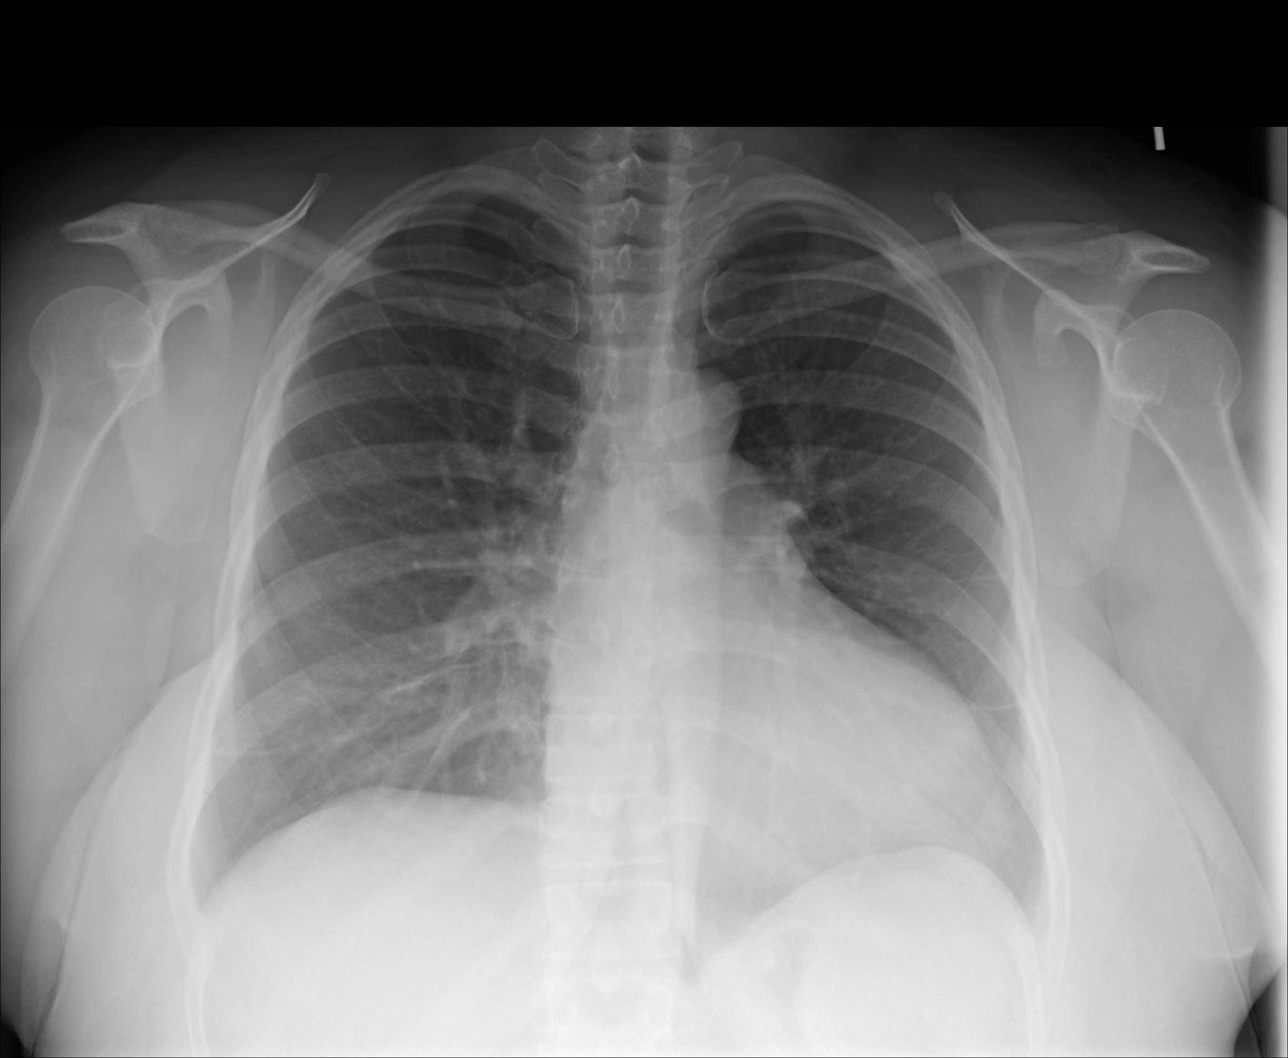
[im 2/4]
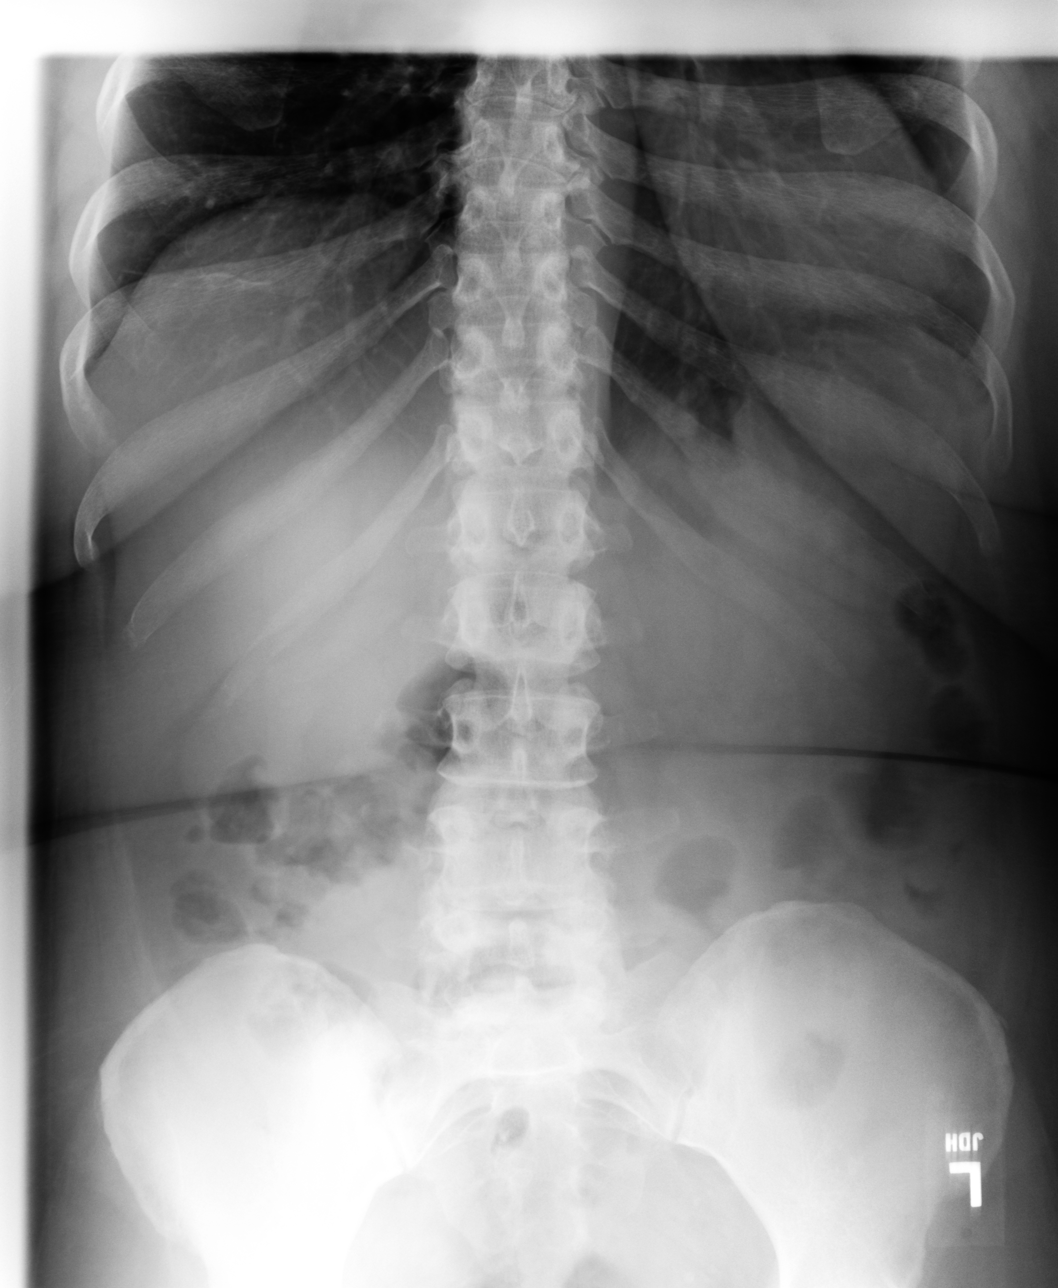
[im 3/4]
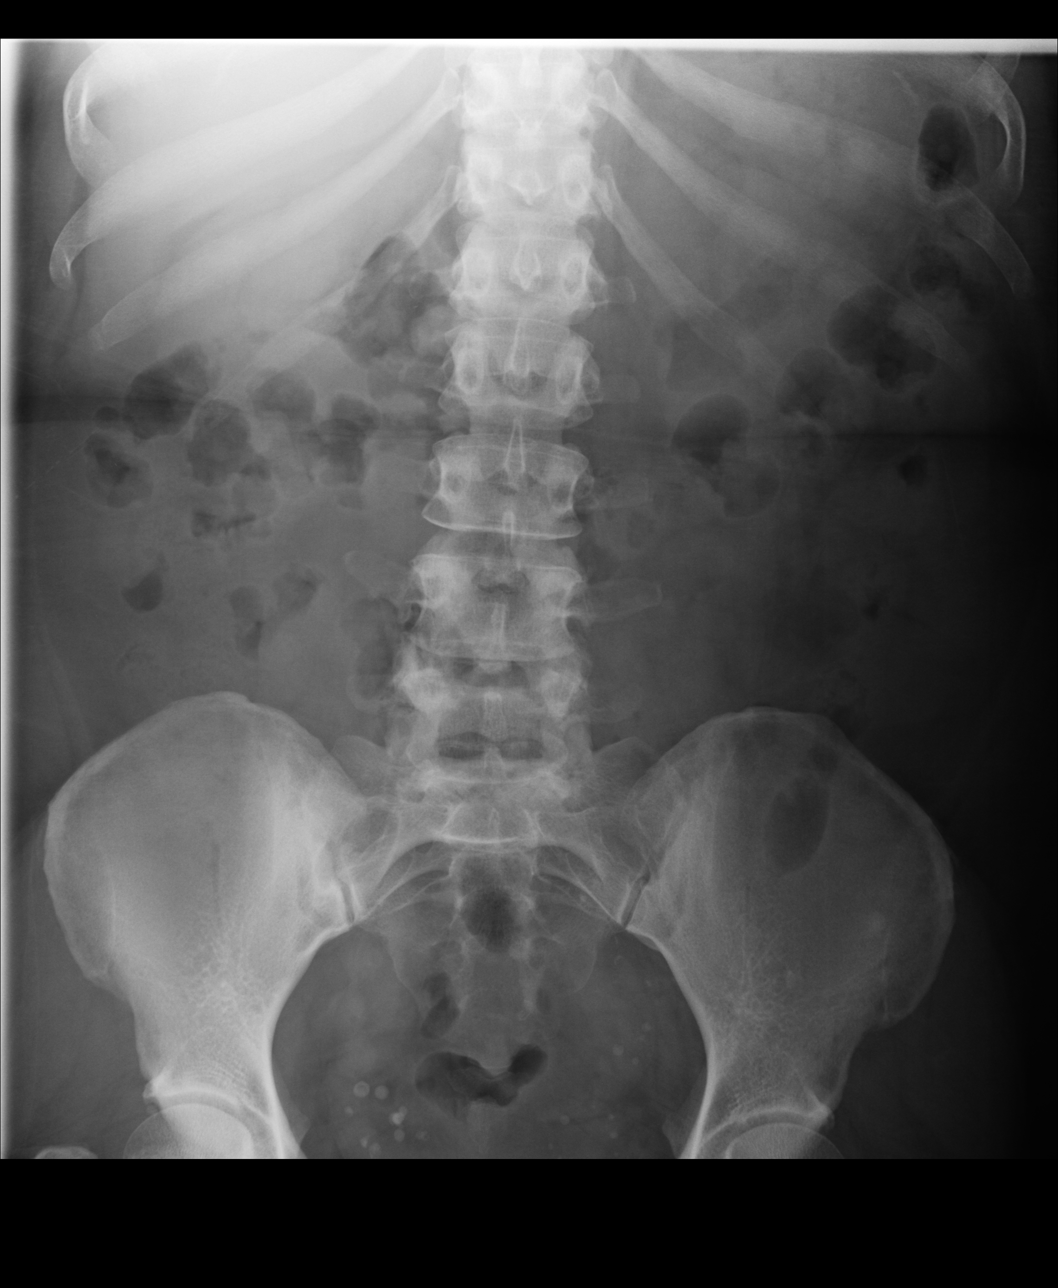
[im 4/4]
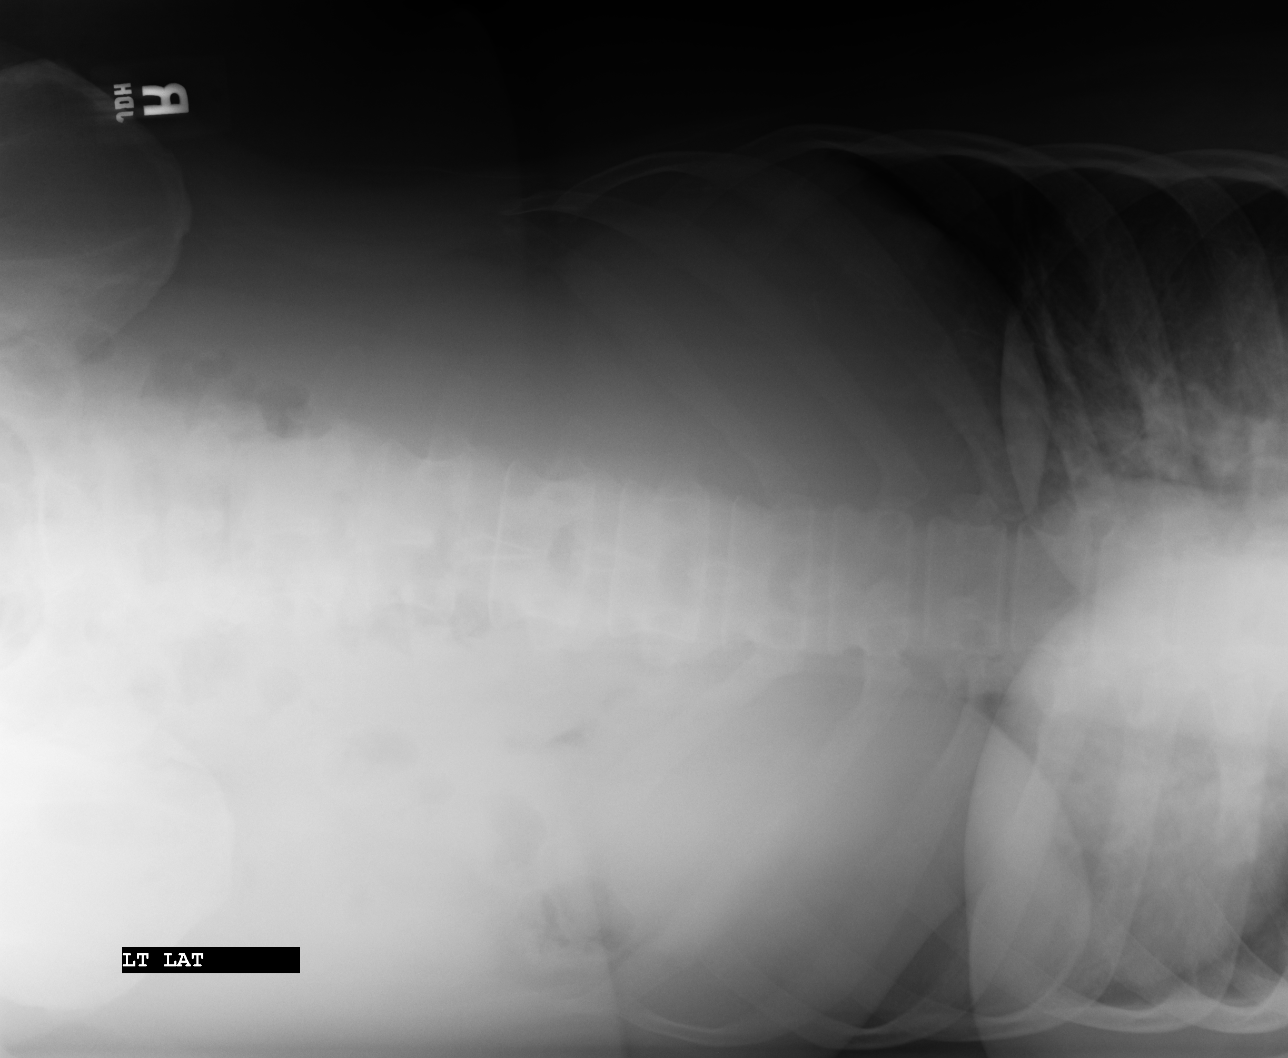

[4 of 4 positions shown; findings below may reference images not displayed]

PROCEDURE:     DXR - DXR ABDOMEN 3-WAY (INCL PA CXR)  - [DATE]  [DATE]

RESULT:          The gas pattern is nonspecific with no abnormal distention
of large or small bowel.  No free intraperitoneal air is identified.
Multiple pelvic phleboliths are noted.

The accompanying chest film reveals mild cardiomegaly.  The lung fields are
clear.  The vascularity is within normal limits with no effusions.
IMPRESSION: Nonspecific gas pattern.

## 2005-10-29 ENCOUNTER — Emergency Department: Payer: Self-pay | Admitting: Unknown Physician Specialty

## 2005-10-29 ENCOUNTER — Other Ambulatory Visit: Payer: Self-pay

## 2005-10-29 IMAGING — CR DG CHEST 1V PORT
1 series · 1 of 1 positions shown · non-contrast
Comparison: none

REASON FOR EXAM: chest pain
COMMENTS:

[view not recorded]
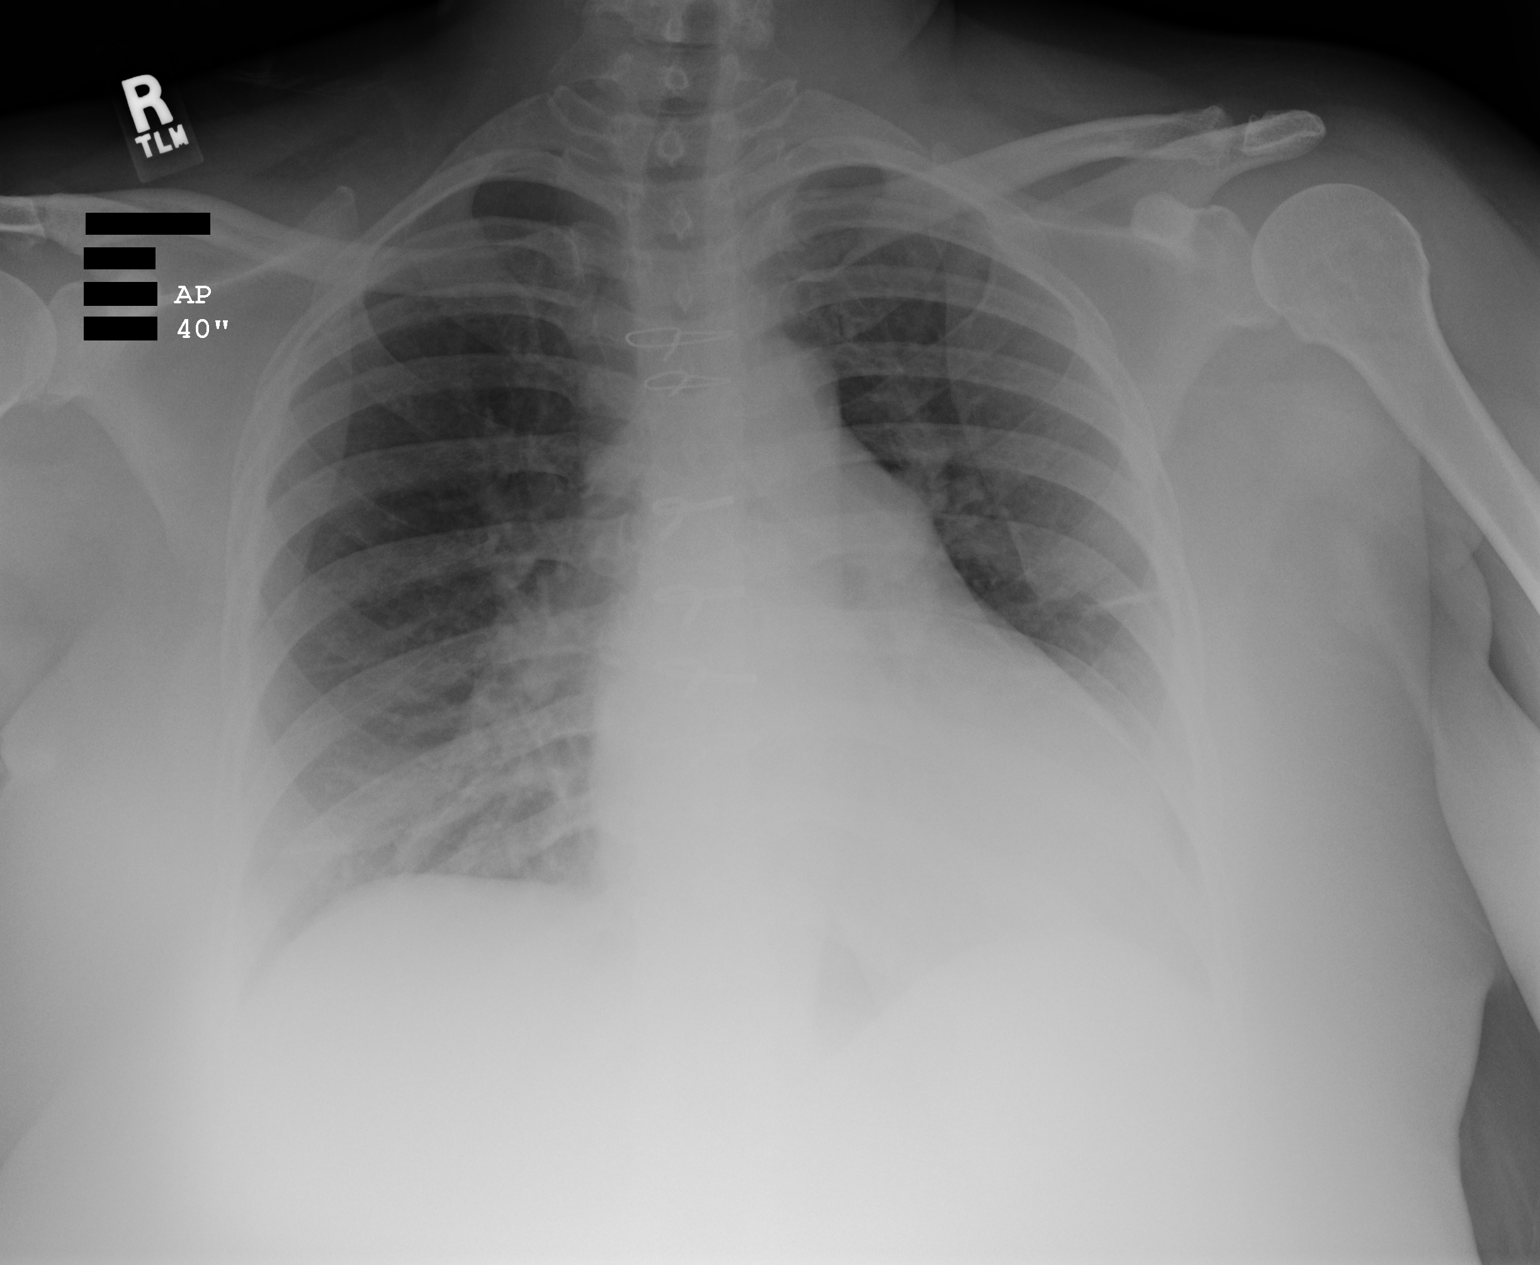

[1 of 1 positions shown; findings below may reference images not displayed]

PROCEDURE:     DXR - DXR PORTABLE CHEST SINGLE VIEW  - [DATE]  [DATE]

RESULT:          Comparison is made to a prior study dated [DATE].

The patient is taking a shallow inspiration.  With technique taken into
consideration, there does not appear to be evidence of focal infiltrates,
effusions or edema.  The cardiac silhouette is enlarged.  The patient is
status post median sternotomy, which appears to have occurred in the interim
when compared to the previous study.
IMPRESSION: Shallow inspiration without focal regions of
consolidation or focal infiltrates.

## 2005-11-25 ENCOUNTER — Emergency Department: Payer: Self-pay | Admitting: Emergency Medicine

## 2005-11-25 ENCOUNTER — Other Ambulatory Visit: Payer: Self-pay

## 2005-11-25 IMAGING — CR DG CHEST 2V
1 series · 2 of 2 positions shown · non-contrast
Comparison: none

REASON FOR EXAM: chest pain
COMMENTS:

[Series 1: view not recorded · 0.17mm/px · 2 of 2 slices shown]
[im 1/2]
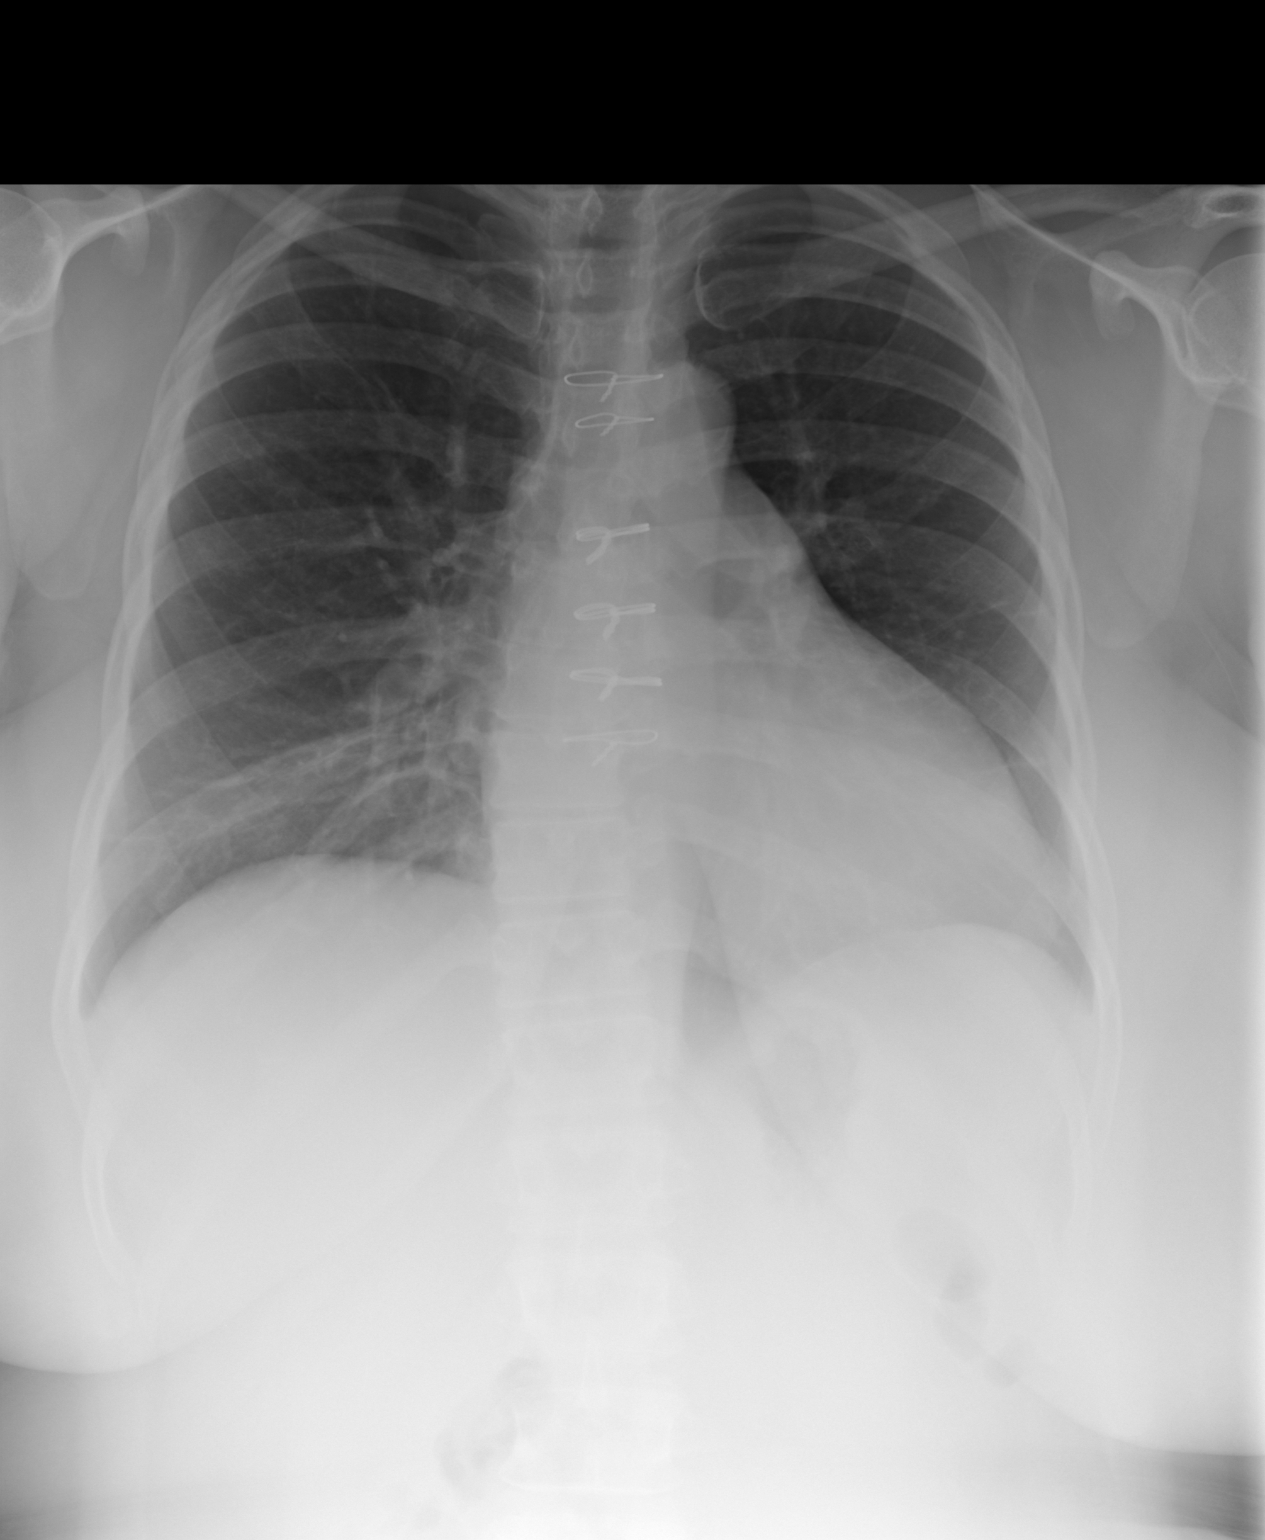
[im 2/2]
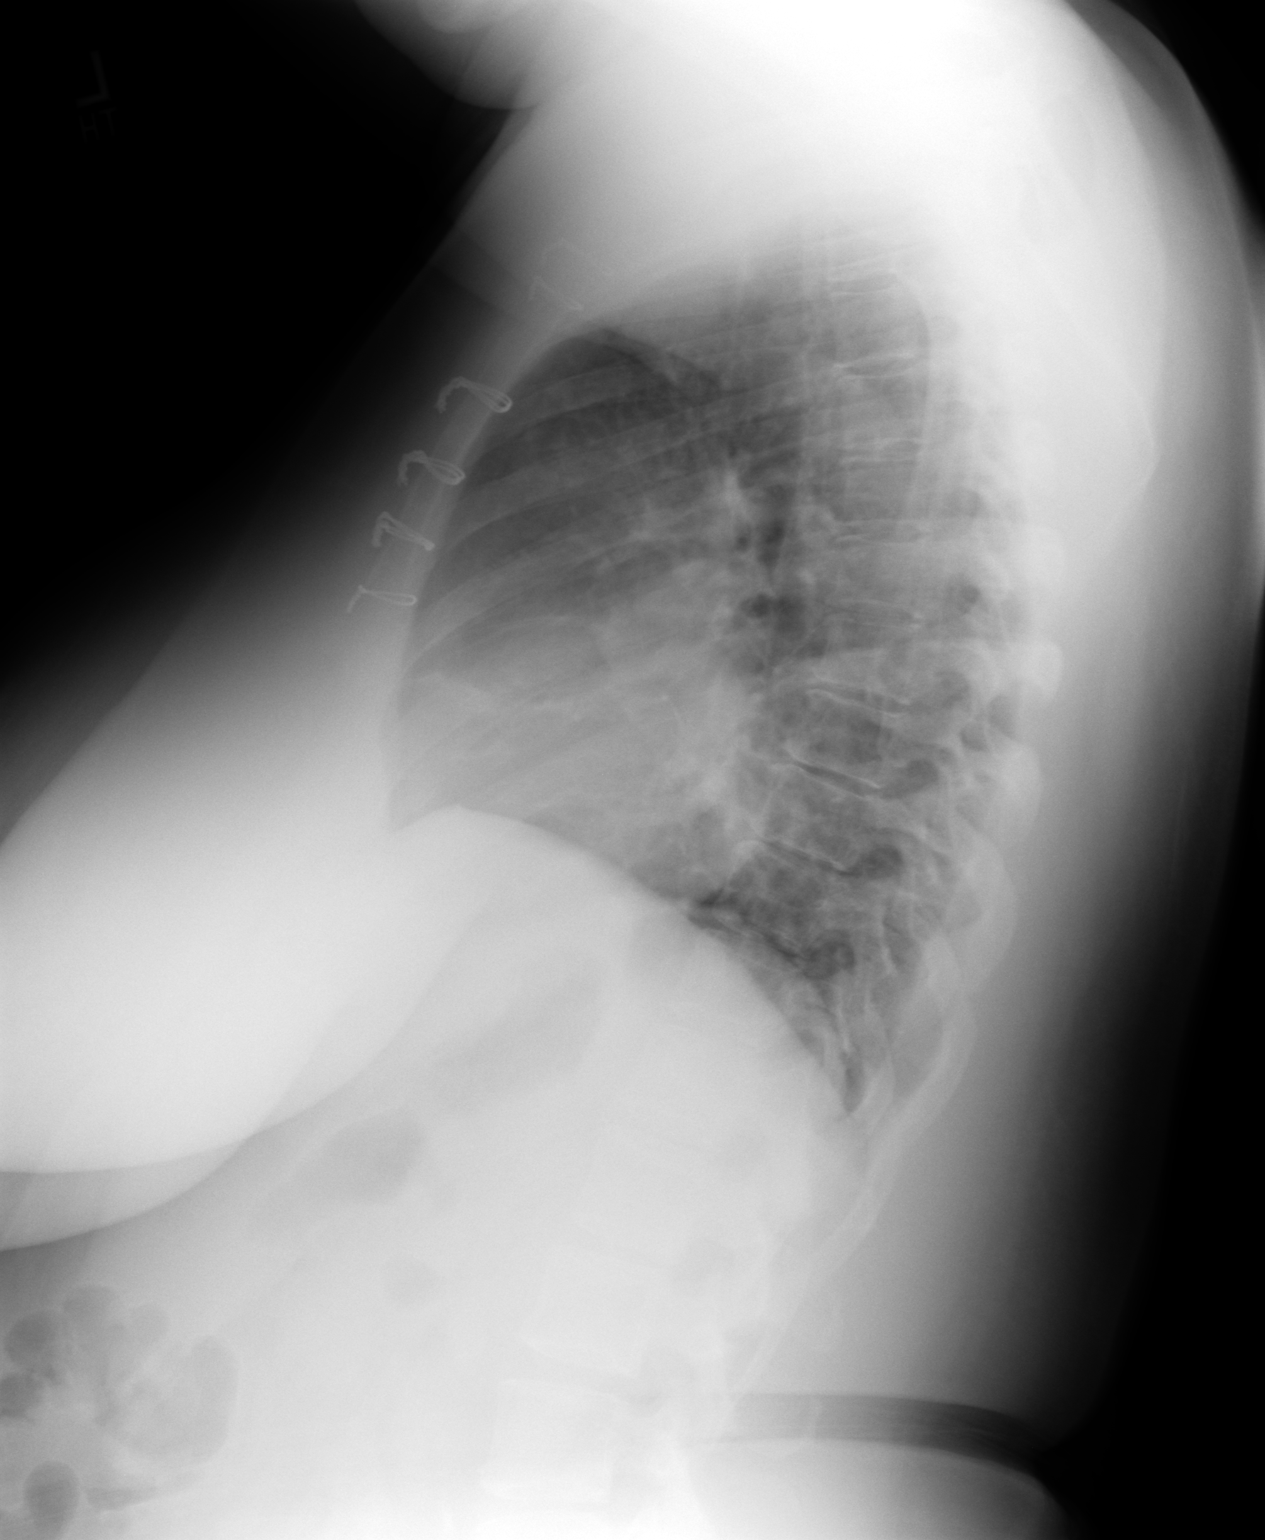

[2 of 2 positions shown; findings below may reference images not displayed]

PROCEDURE:     DXR - DXR CHEST PA (OR AP) AND LATERAL  - [DATE]  [DATE]

RESULT:          The lung fields are clear.  No pneumonia, pneumothorax, or
pleural effusion is seen.  The heart is mildly enlarged but stable as
compared to the prior exam.  Postoperative changes compatible with prior
CABG are noted.
IMPRESSION: 1.     Stable-appearing chest as compared to the exam of [DATE].
[DATE].     The lung fields are clear.
3.     Mild cardiomegaly.

## 2006-02-01 ENCOUNTER — Emergency Department: Payer: Self-pay | Admitting: Emergency Medicine

## 2006-03-06 ENCOUNTER — Emergency Department: Payer: Self-pay | Admitting: Emergency Medicine

## 2006-03-06 ENCOUNTER — Other Ambulatory Visit: Payer: Self-pay

## 2006-03-09 ENCOUNTER — Inpatient Hospital Stay: Payer: Self-pay | Admitting: Internal Medicine

## 2006-03-09 IMAGING — CT CT ABD-PELV W/ CM
1 of 3 series · 13 of 32 positions shown, 19 images · non-contrast
Comparison: none

REASON FOR EXAM: abdominal and pelvic pain
COMMENTS:

[Series 2: abdomen · axial · 0.73mm/px · z∈[-502,-118]mm · 13 of 56 slices shown, 19 images]
[im 4/56  soft-tissue]
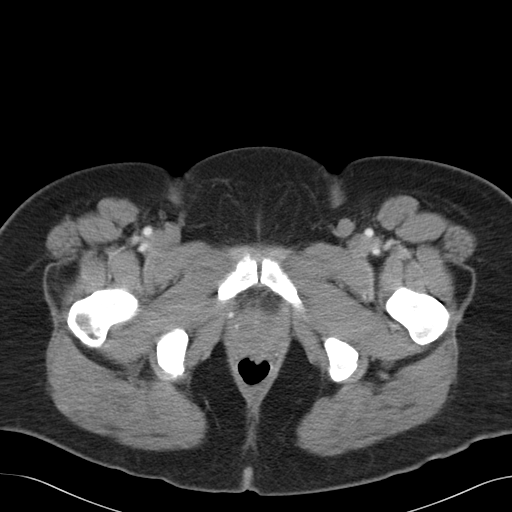
[im 4/56  bone]
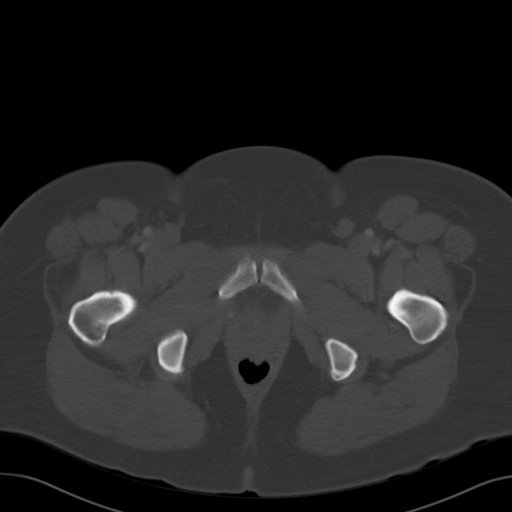
[im 8/56  soft-tissue]
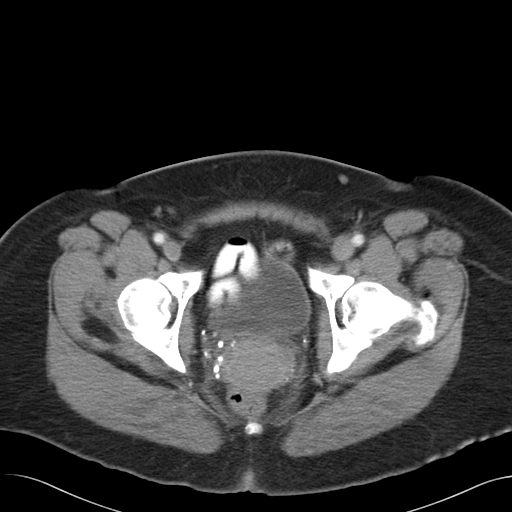
[im 12/56  soft-tissue]
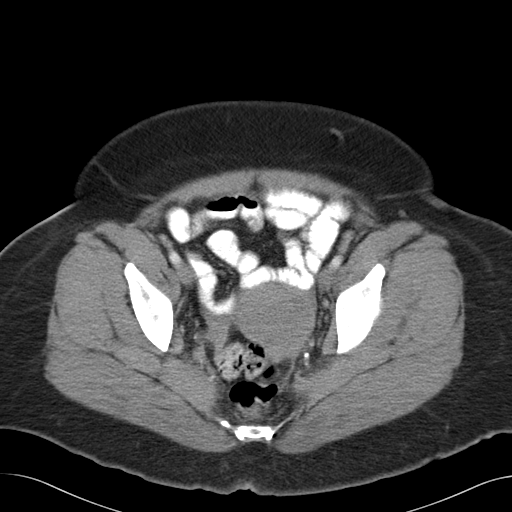
[im 16/56  soft-tissue]
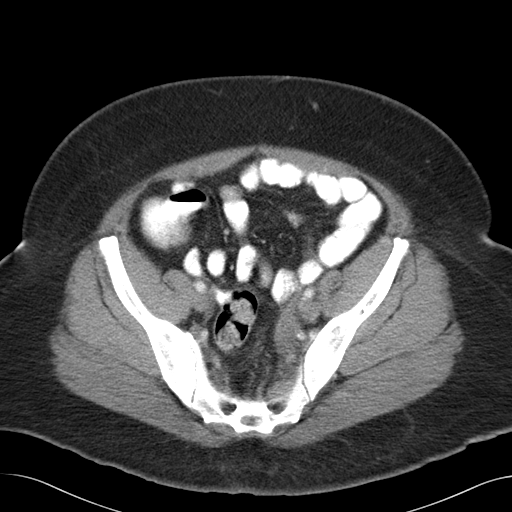
[im 20/56  soft-tissue]
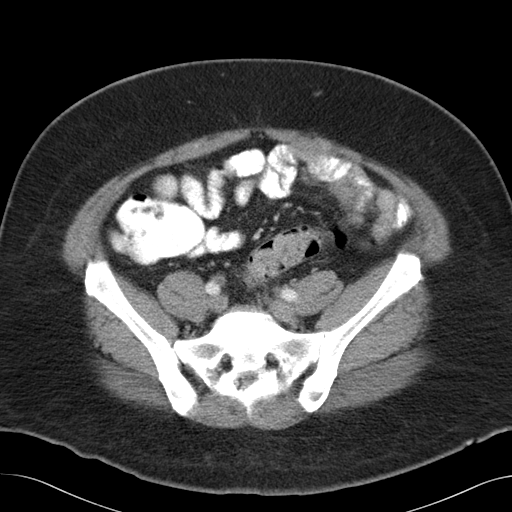
[im 24/56  soft-tissue]
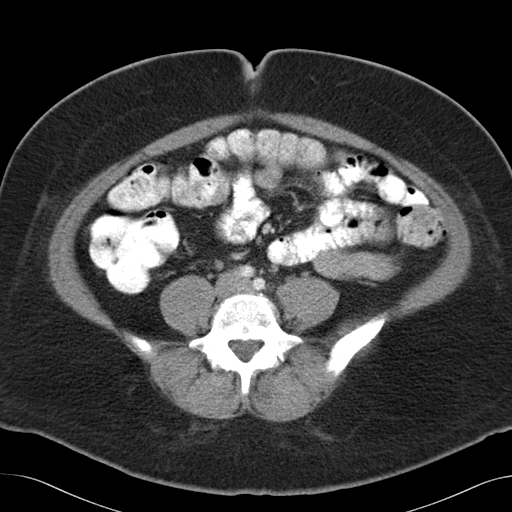
[im 28/56  soft-tissue]
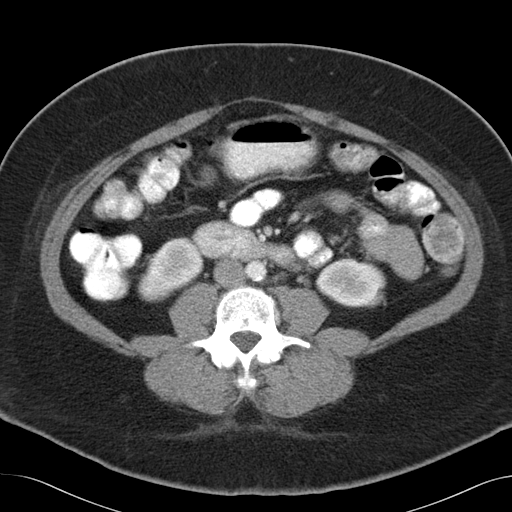
[im 32/56  soft-tissue]
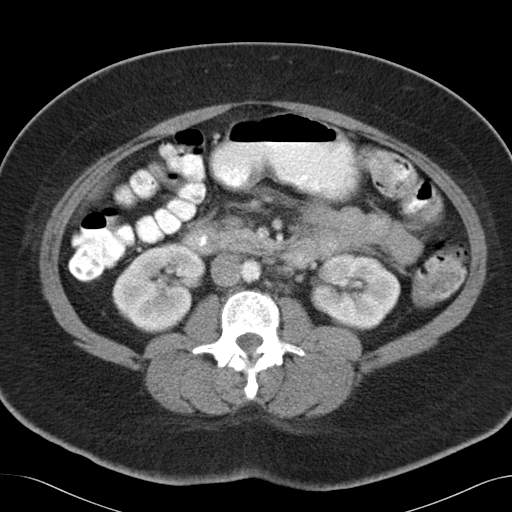
[im 36/56  soft-tissue]
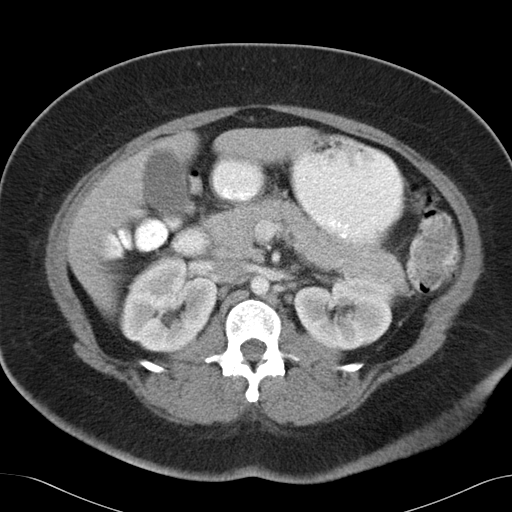
[im 36/56  bone]
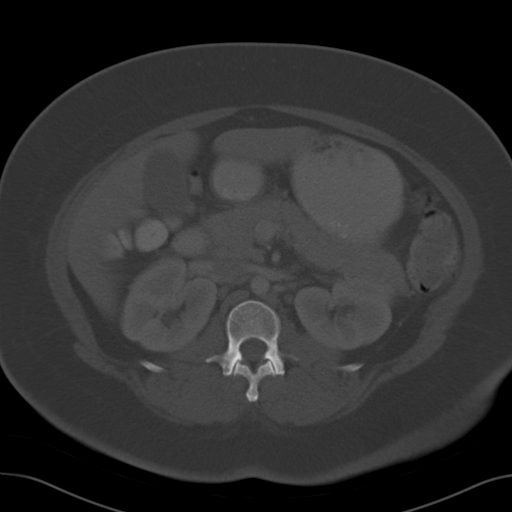
[im 40/56  soft-tissue]
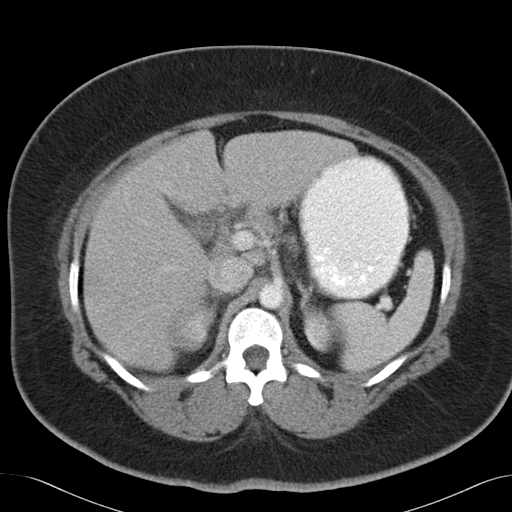
[im 40/56  lung]
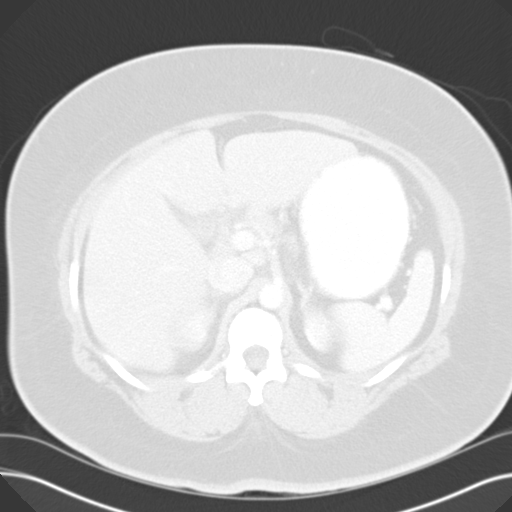
[im 44/56  soft-tissue]
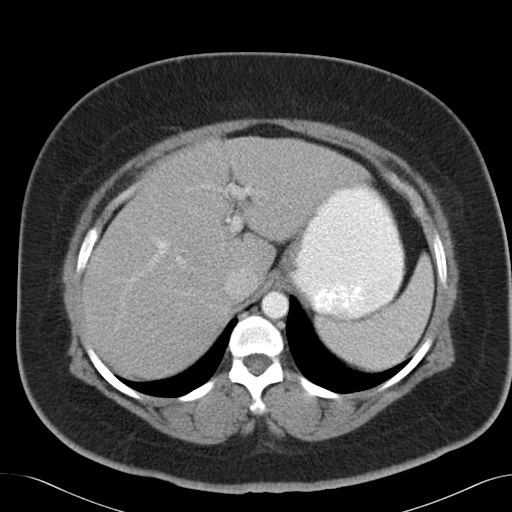
[im 44/56  lung]
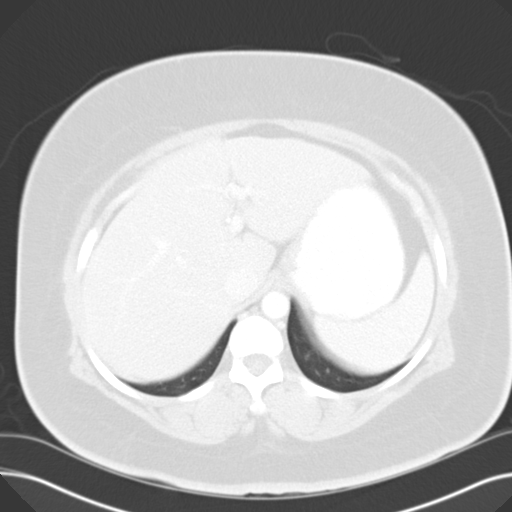
[im 48/56  soft-tissue]
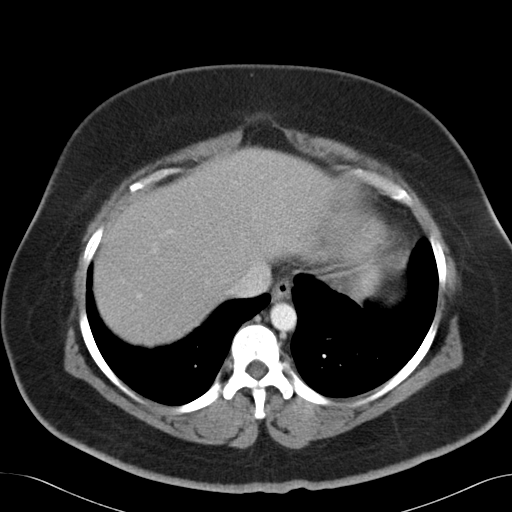
[im 48/56  lung]
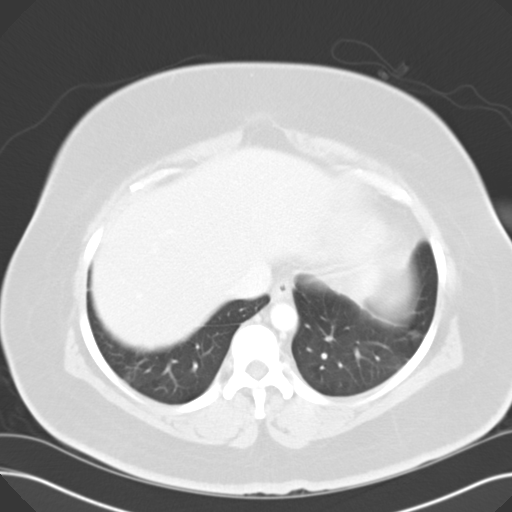
[im 52/56  soft-tissue]
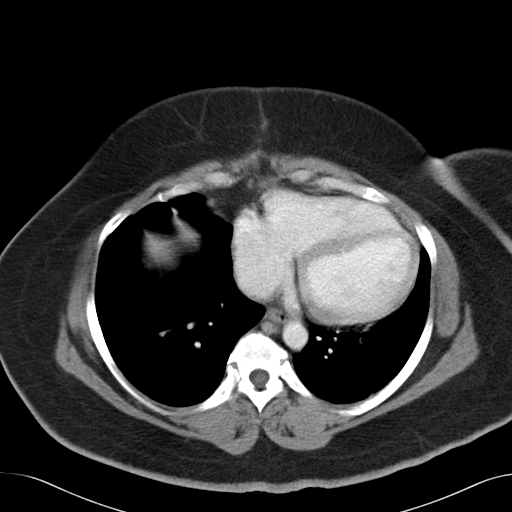
[im 52/56  lung]
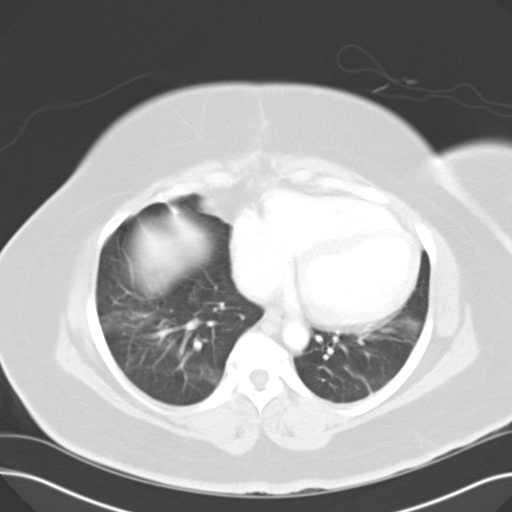

[13 of 32 positions shown; findings below may reference images not displayed]

PROCEDURE:     CT  - CT ABDOMEN / PELVIS  W  - [DATE]  [DATE]

RESULT:          Helical 8 mm sections were obtained from the lung bases
through the pubic symphysis status post intravenous administration of 100 ml
of [EG] and oral contrast.

There appears to be hypoventilation within the lung bases.

Evaluation of the liver, adrenals, kidneys, and spleen is unremarkable.

Evaluation of the pancreas demonstrates loss of the normal undulating
pancreatic border, and a small amount of fluid projects in the region of the
junction of the head and tail of the pancreas.  No drainable loculated fluid
collections are identified nor evidence of a pseudocyst. There does not
appear to be evidence of a pancreatic mass.  There also appears to be
inflammatory change along the inferior aspect of the uncinate process.
Evaluation of the remaining abdomen and pelvis demonstrates no further
evidence of drainable loculated fluid collections.  A small amount of free
fluid is demonstrated within the cul-de-sac.  The celiac, SMA, IMA, SMV and
portal vein are patent.
IMPRESSION: Findings possibly representing an element of early or
mild pancreatitis.  Correlation with pancreatic function tests is
recommended.

Dr. RUDI, of the emergency department, was informed of these findings at
the time of the initial interpretation.

## 2006-03-10 IMAGING — US ABDOMEN ULTRASOUND
1 series · 17 of 25 positions shown · non-contrast
Comparison: none

REASON FOR EXAM: Rule out galbladder disease
COMMENTS:

[Series 1: abdomen ultrasound · 17 of 40 slices shown]
[im 1/40]
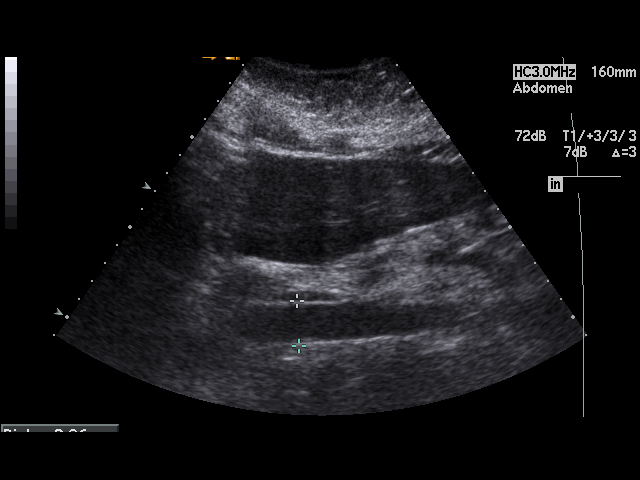
[im 4/40]
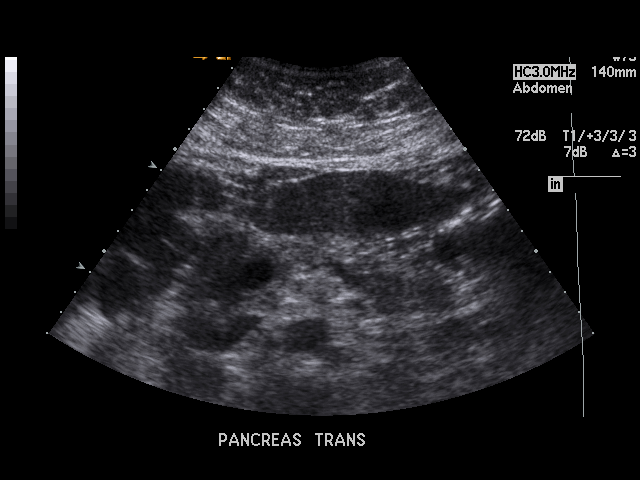
[im 5/40]
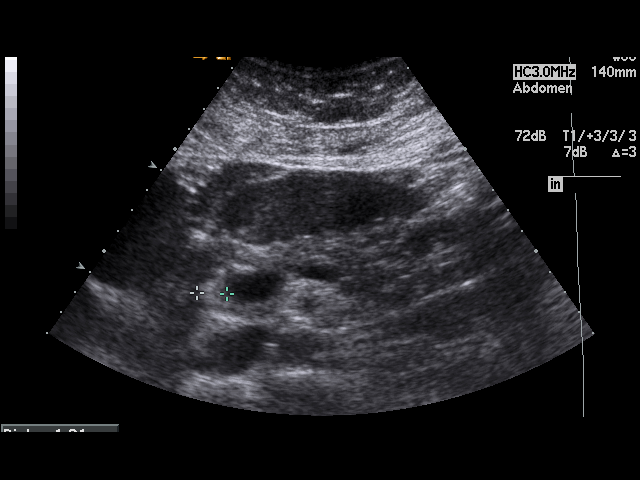
[im 9/40]
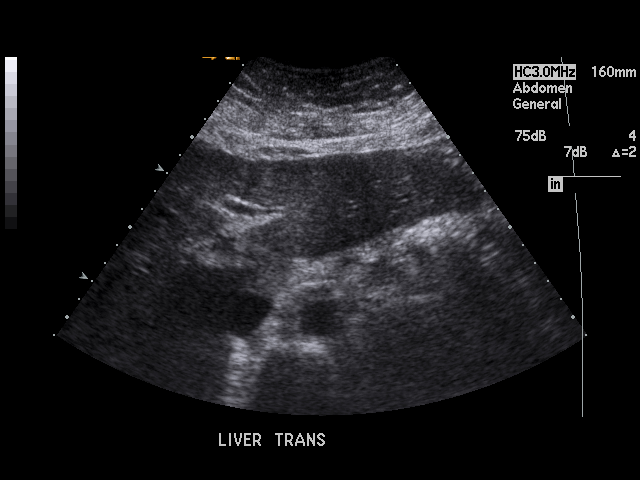
[im 10/40]
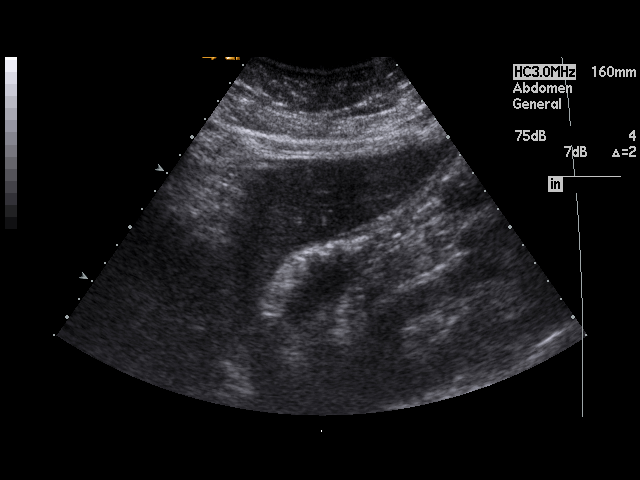
[im 14/40]
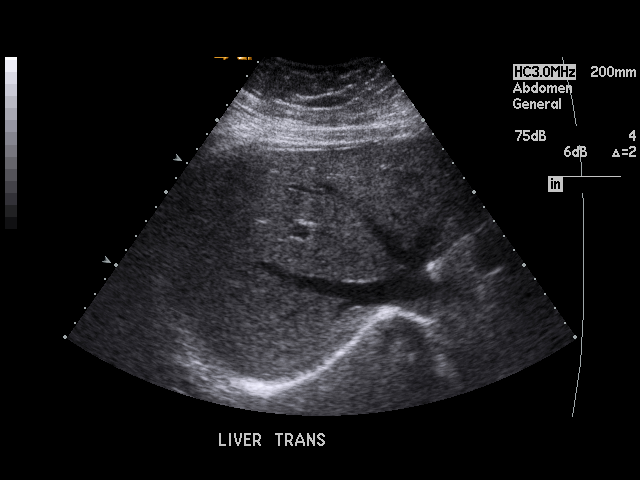
[im 15/40]
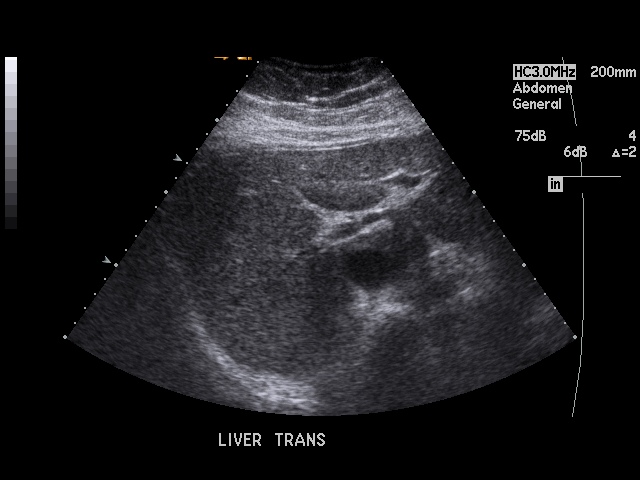
[im 18/40]
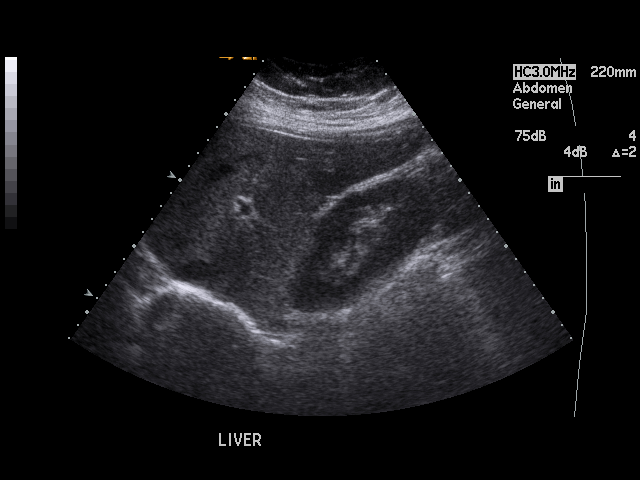
[im 20/40]
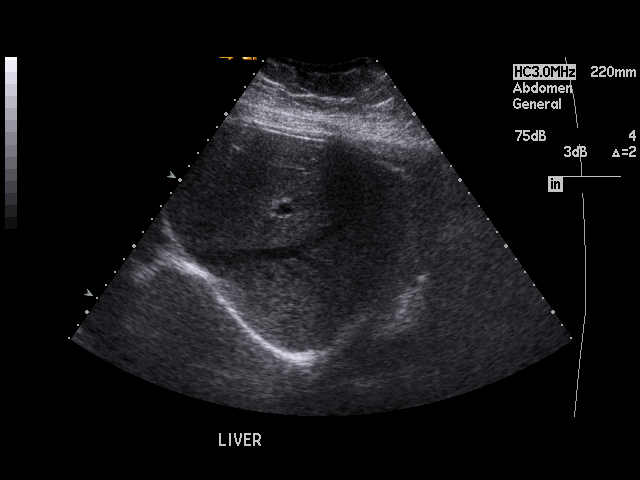
[im 22/40]
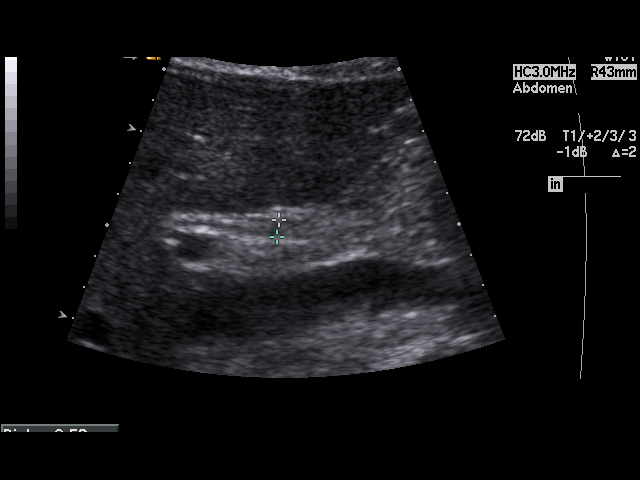
[im 25/40]
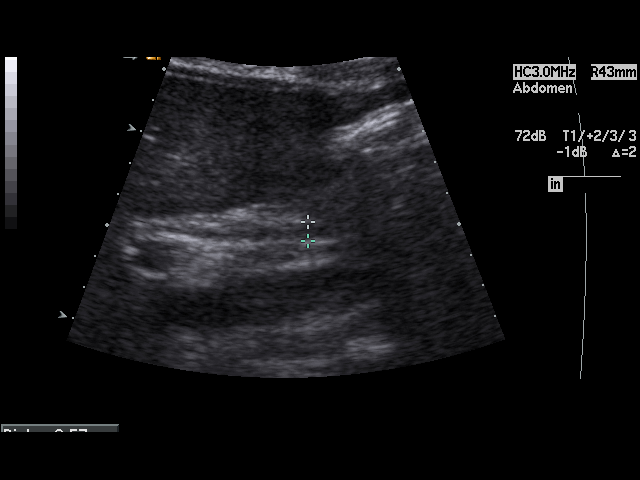
[im 27/40]
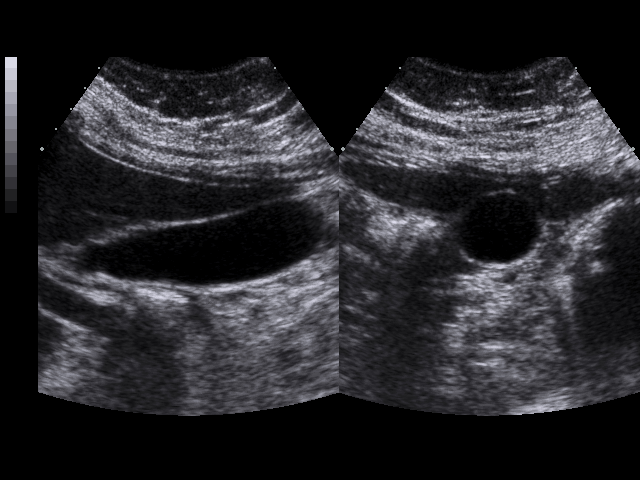
[im 30/40]
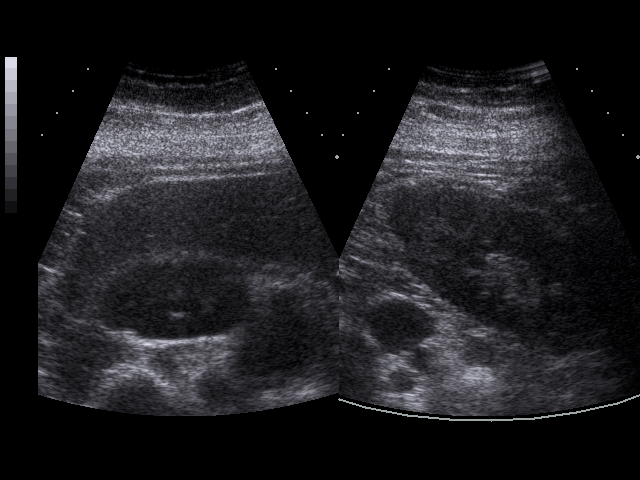
[im 31/40]
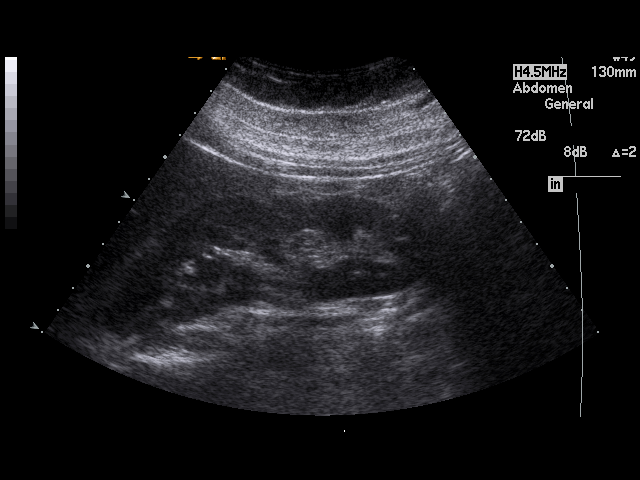
[im 35/40]
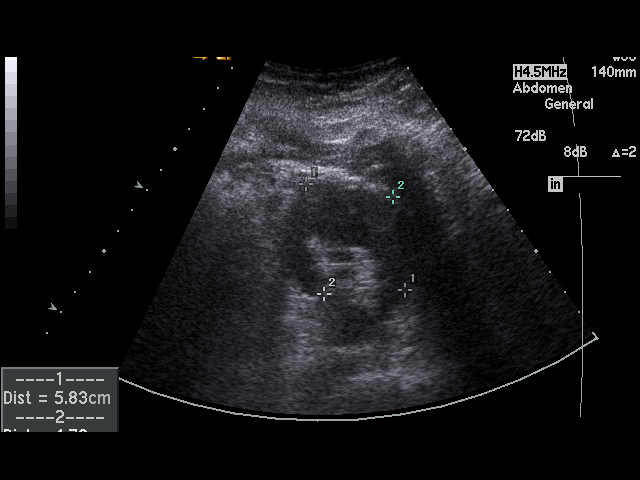
[im 36/40]
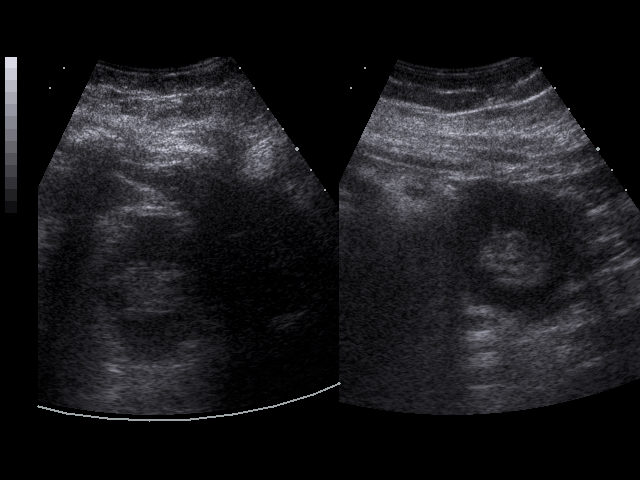
[im 40/40]
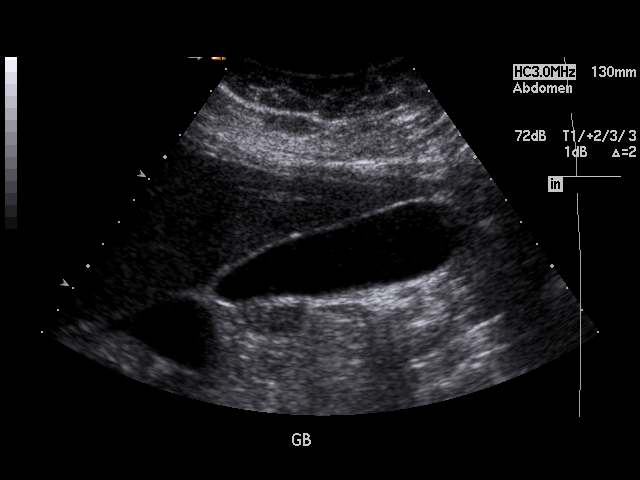

[17 of 25 positions shown; findings below may reference images not displayed]

PROCEDURE:     US  - US ABDOMEN GENERAL SURVEY  - [DATE]  [DATE]

RESULT:     The liver, spleen and abdominal aorta are normal in appearance.
The pancreas is not optimally visualized but the visualized portions are
normal in appearance. No gallstones are seen. There is no thickening of the
gallbladder wall. The common bile duct measures 6.0 mm in diameter which is
within the limits of normal. The kidneys show no hydronephrosis. There is no
ascites.
IMPRESSION: No significant abnormalities are noted.

## 2006-04-27 ENCOUNTER — Emergency Department: Payer: Self-pay | Admitting: Emergency Medicine

## 2006-04-27 ENCOUNTER — Other Ambulatory Visit: Payer: Self-pay

## 2006-06-07 ENCOUNTER — Emergency Department: Payer: Self-pay | Admitting: Emergency Medicine

## 2006-07-04 ENCOUNTER — Emergency Department: Payer: Self-pay | Admitting: Emergency Medicine

## 2006-09-04 ENCOUNTER — Emergency Department: Payer: Self-pay | Admitting: Emergency Medicine

## 2006-12-29 ENCOUNTER — Emergency Department: Payer: Self-pay | Admitting: Emergency Medicine

## 2006-12-29 ENCOUNTER — Other Ambulatory Visit: Payer: Self-pay

## 2006-12-29 IMAGING — CR DG CHEST 2V
1 series · 2 of 2 positions shown · non-contrast
Comparison: none

REASON FOR EXAM: cough
COMMENTS:

[Series 1: view not recorded · 0.17mm/px · 2 of 2 slices shown]
[im 1/2]
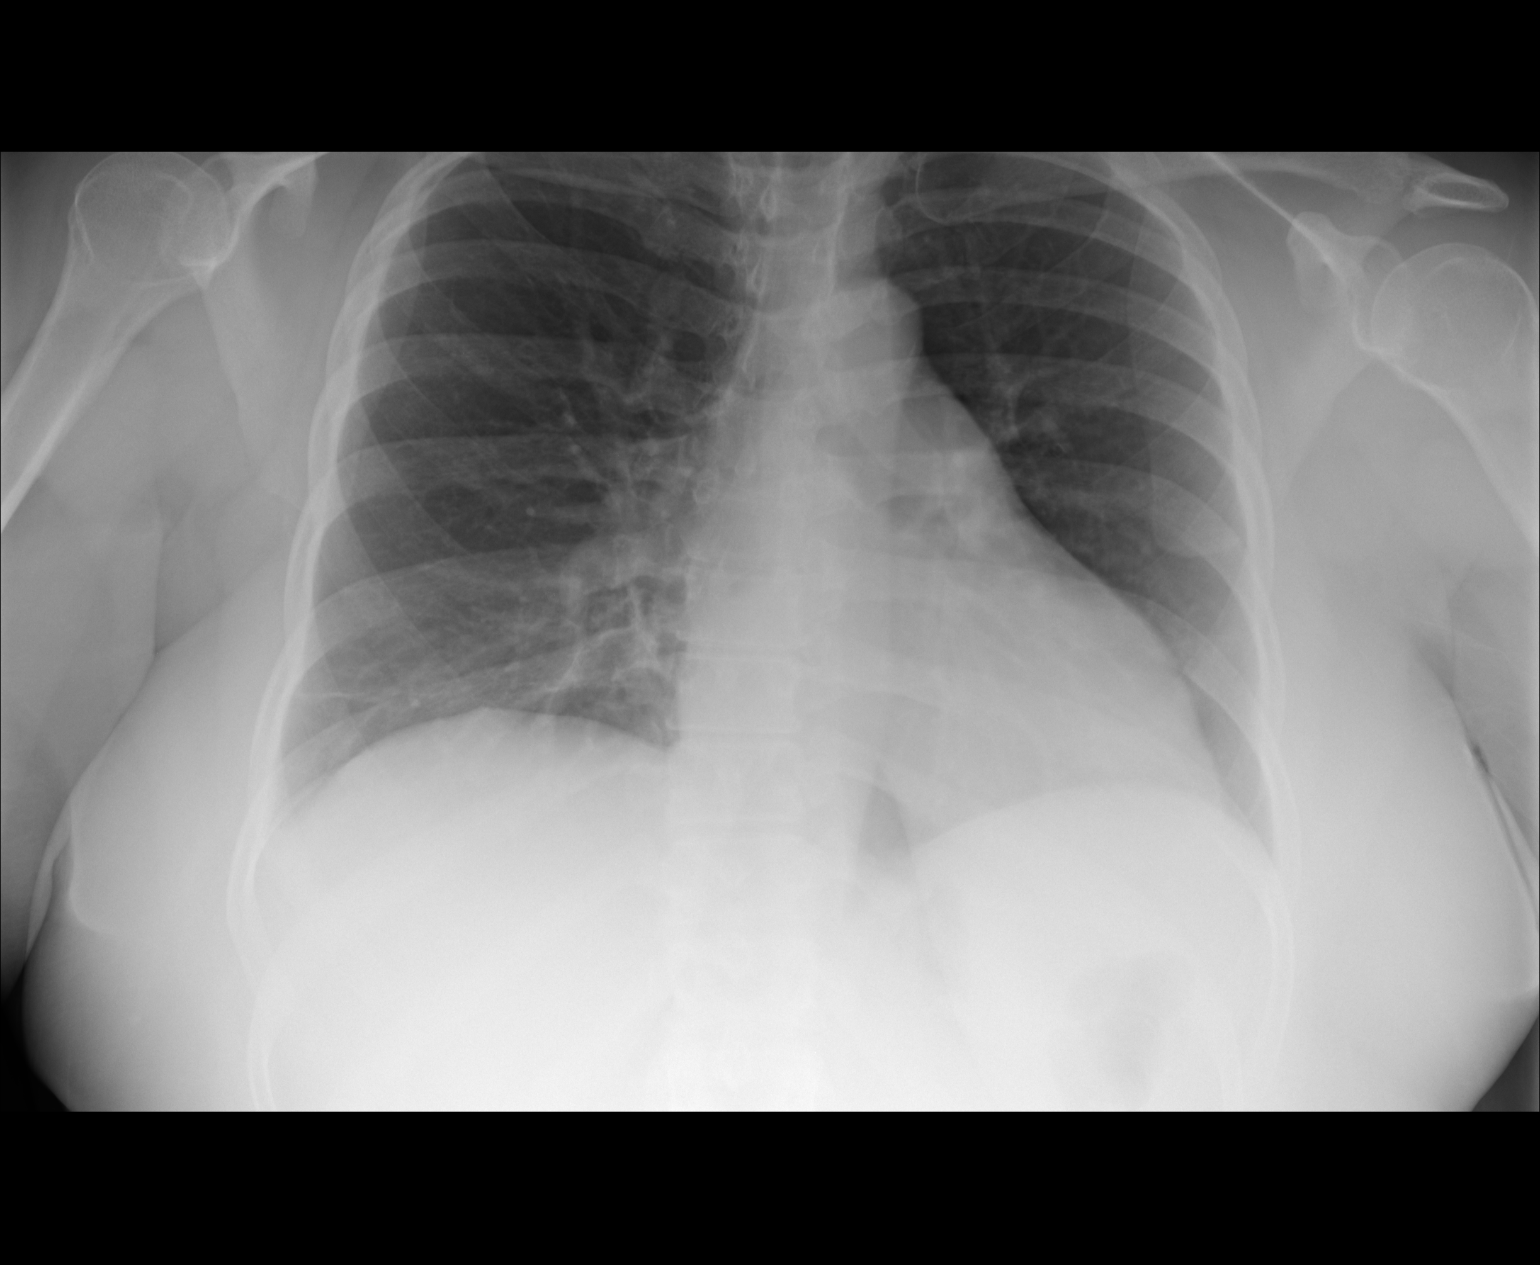
[im 2/2]
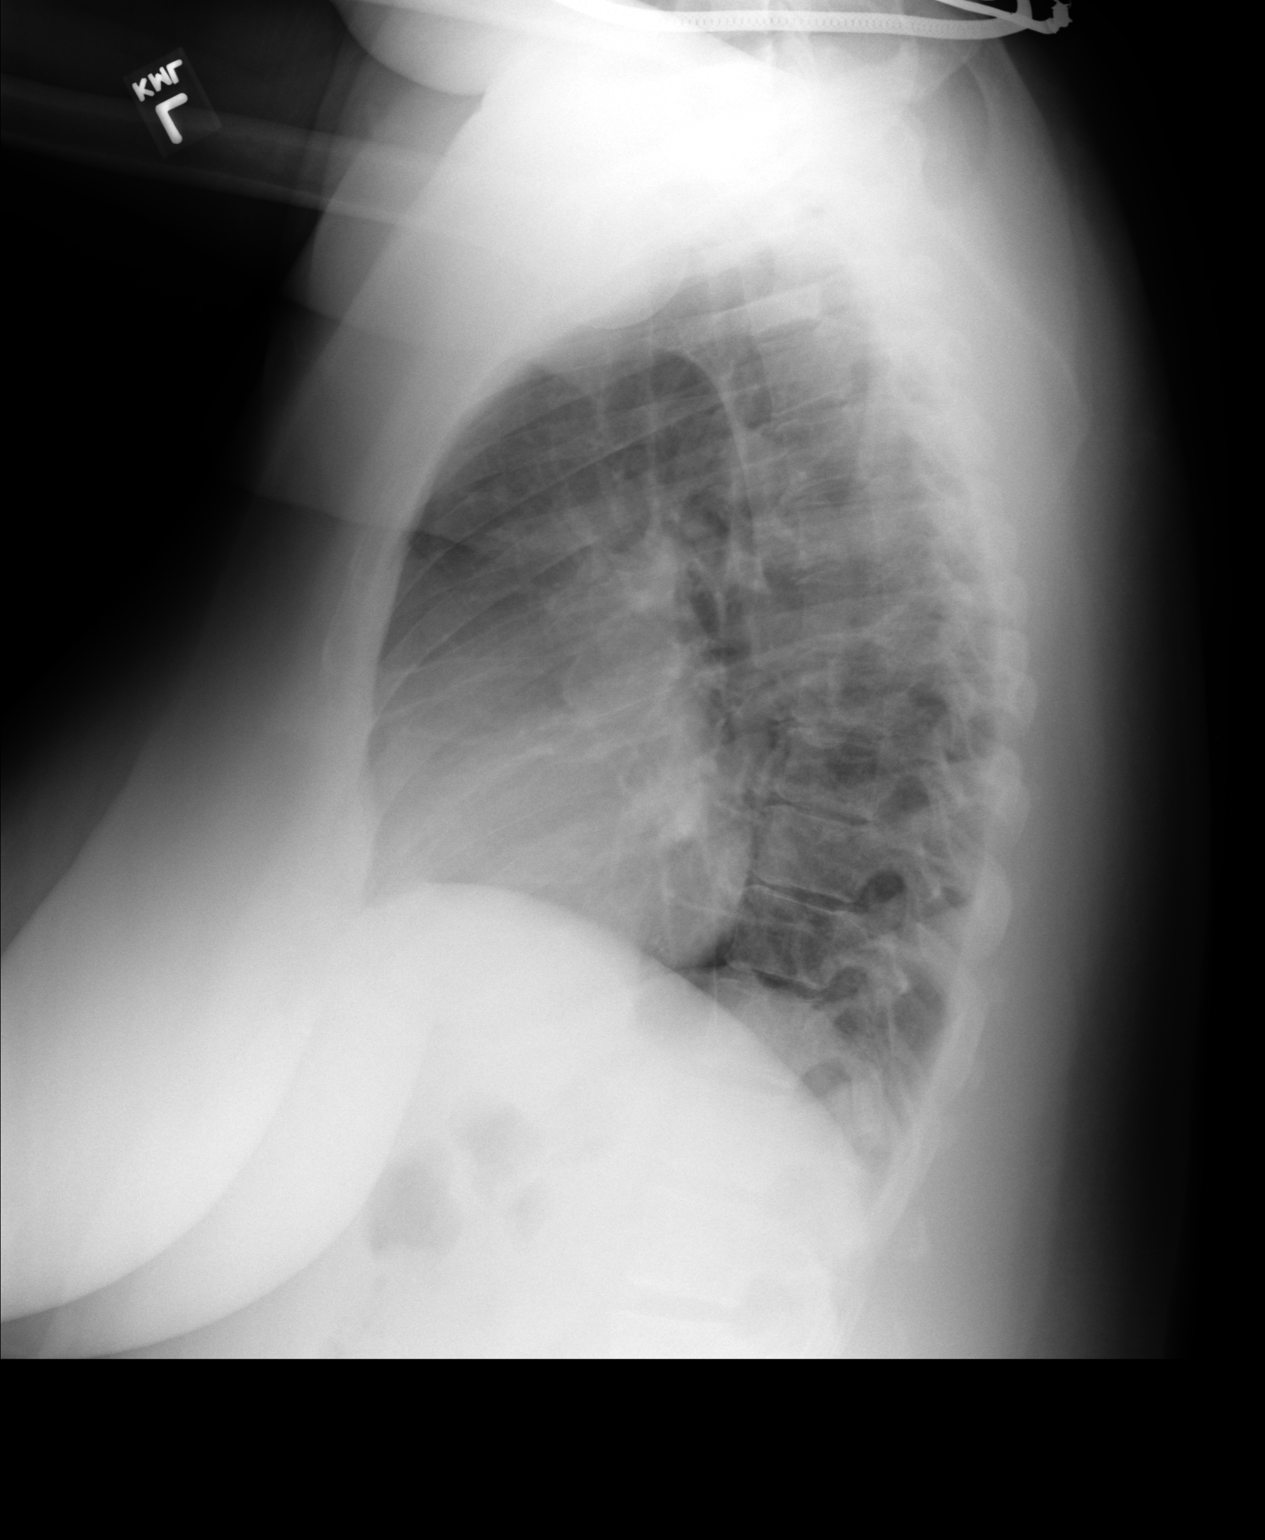

[2 of 2 positions shown; findings below may reference images not displayed]

PROCEDURE:     DXR - DXR CHEST PA (OR AP) AND LATERAL  - [DATE]  [DATE]

RESULT:     Comparison is made to the prior exam of [DATE].  There is a
linear density at the RIGHT base compatible with a fibrotic strand or
minimal discoid atelectasis. The lung fields otherwise are clear. No
pneumonia, pneumothorax or pleural effusion is seen. Heart size is within
normal limits. No acute bony abnormalities are noted.
IMPRESSION: 1. No acute changes are identified.
2. Possible minimal atelectasis at the RIGHT base.

## 2007-01-29 ENCOUNTER — Emergency Department: Payer: Self-pay | Admitting: Emergency Medicine

## 2007-03-17 ENCOUNTER — Other Ambulatory Visit: Payer: Self-pay

## 2007-03-17 ENCOUNTER — Emergency Department: Payer: Self-pay | Admitting: Emergency Medicine

## 2007-03-17 IMAGING — CR DG CHEST 1V PORT
1 series · 1 of 1 positions shown · non-contrast
Comparison: none

REASON FOR EXAM: cp
COMMENTS:

PROCEDURE:     DXR - DXR PORTABLE CHEST SINGLE VIEW  - [DATE] [DATE]
RESULT:     Comparison is made to a prior study dated [DATE].
The lungs are clear.  The cardiac silhouette and visualized bony skeleton
are unremarkable.

[view not recorded]
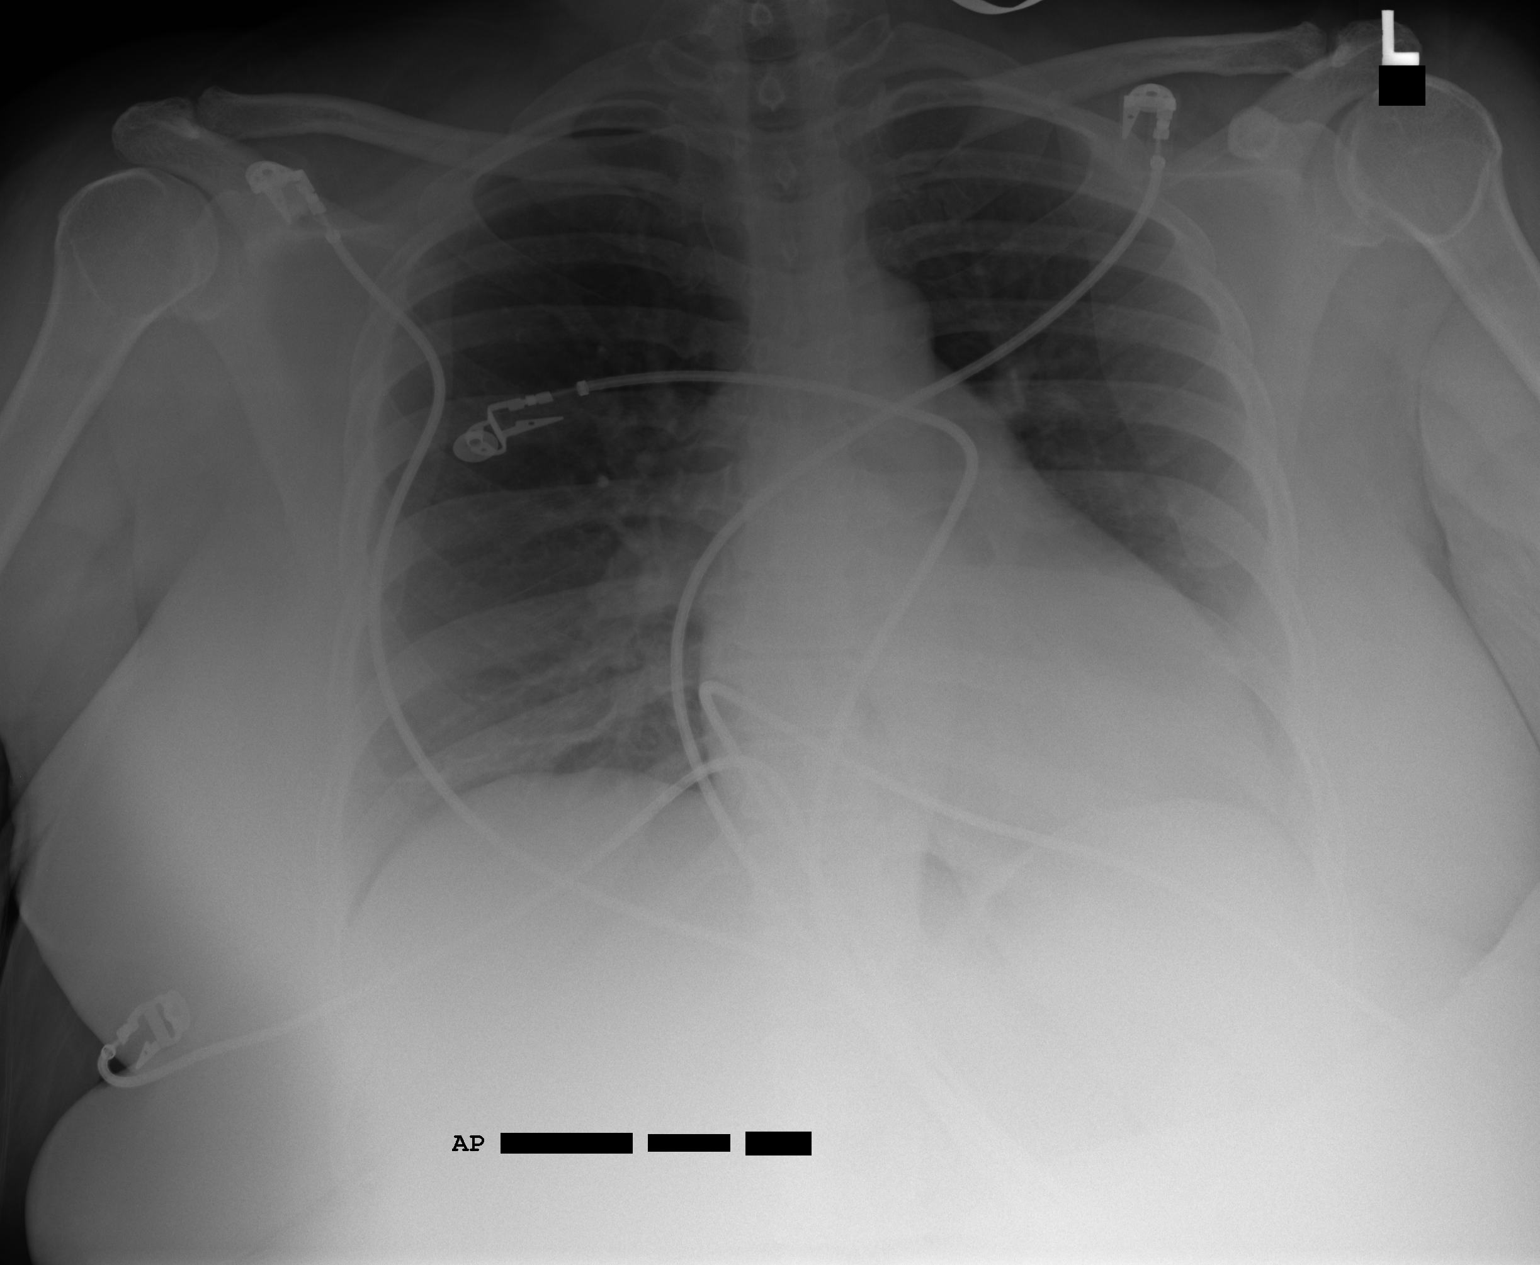

[1 of 1 positions shown; findings below may reference images not displayed]

IMPRESSION: Chest radiograph without evidence of acute cardiopulmonary disease.

## 2007-03-29 ENCOUNTER — Other Ambulatory Visit: Payer: Self-pay

## 2007-03-29 ENCOUNTER — Emergency Department: Payer: Self-pay | Admitting: Emergency Medicine

## 2007-03-29 IMAGING — CR DG CHEST 1V PORT
1 series · 1 of 1 positions shown · non-contrast
Comparison: none

REASON FOR EXAM: Chest Pain
COMMENTS:

[view not recorded]
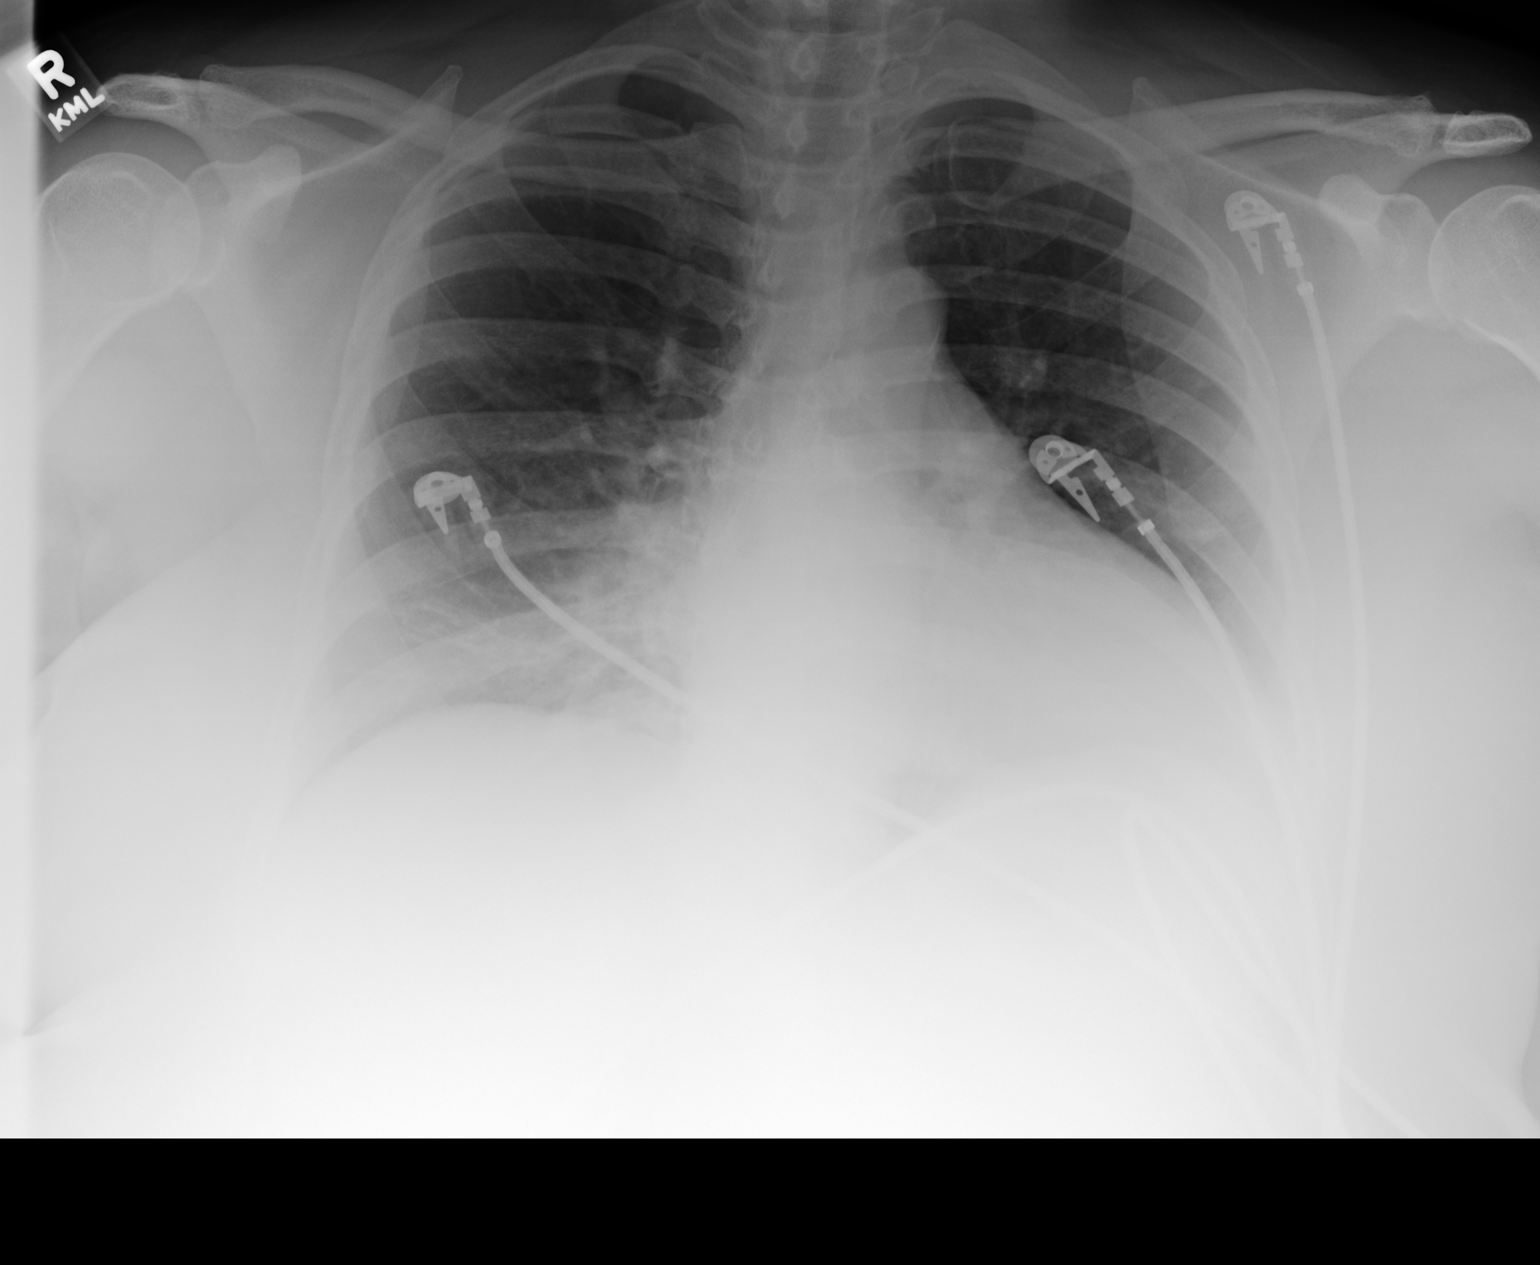

[1 of 1 positions shown; findings below may reference images not displayed]

PROCEDURE:     DXR - DXR PORTABLE CHEST SINGLE VIEW  - [DATE]  [DATE]

RESULT:     Comparison is made to the prior exam of [DATE]. There is
increased density at the lung bases compatible for lack of deep inspiration.
No pneumonia, pneumothorax or pleural effusion is seen. Heart size is a
within normal limits. Monitoring electrodes are present.
IMPRESSION: 1. No acute changes are identified.

## 2007-05-15 ENCOUNTER — Emergency Department: Payer: Self-pay | Admitting: Emergency Medicine

## 2007-08-18 ENCOUNTER — Other Ambulatory Visit: Payer: Self-pay

## 2007-08-18 ENCOUNTER — Emergency Department: Payer: Self-pay | Admitting: Internal Medicine

## 2007-09-05 ENCOUNTER — Emergency Department: Payer: Self-pay | Admitting: Emergency Medicine

## 2007-09-14 ENCOUNTER — Emergency Department: Payer: Self-pay | Admitting: Emergency Medicine

## 2007-09-14 IMAGING — US US PELV - US TRANSVAGINAL
1 series · 17 of 25 positions shown · non-contrast
Comparison: none

REASON FOR EXAM: BLQ  tenderness  h/o fibroids
COMMENTS:

[Series 1: us pelv - us transvaginal · 17 of 30 slices shown]
[im 1/30]
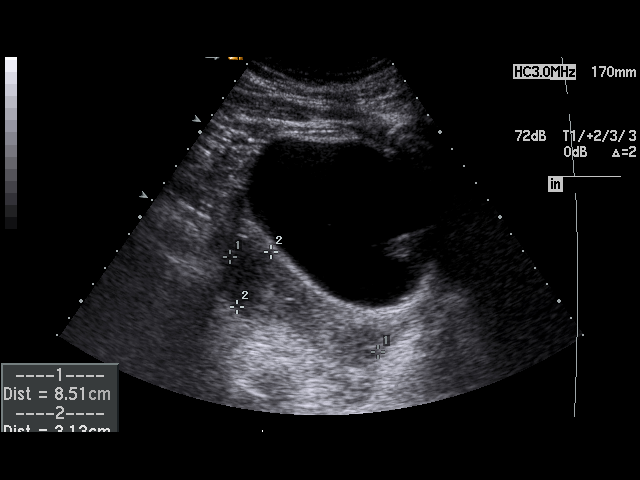
[im 3/30]
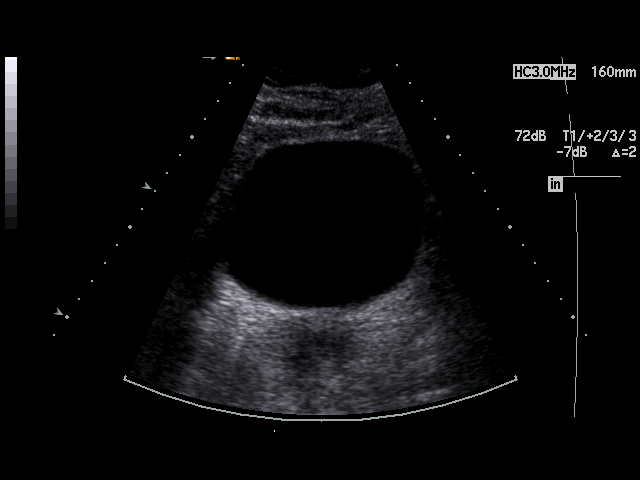
[im 4/30]
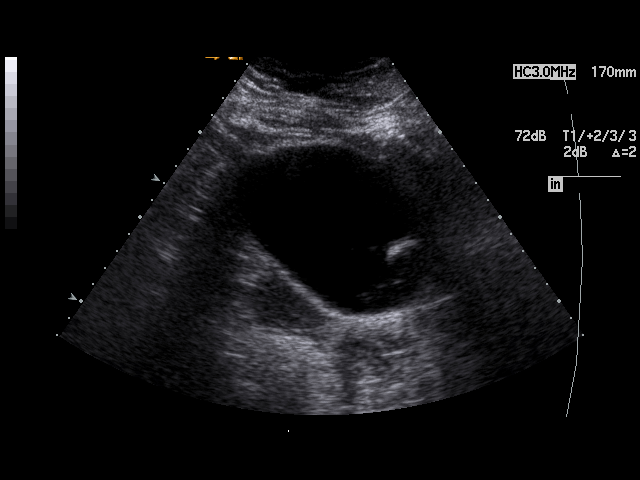
[im 7/30]
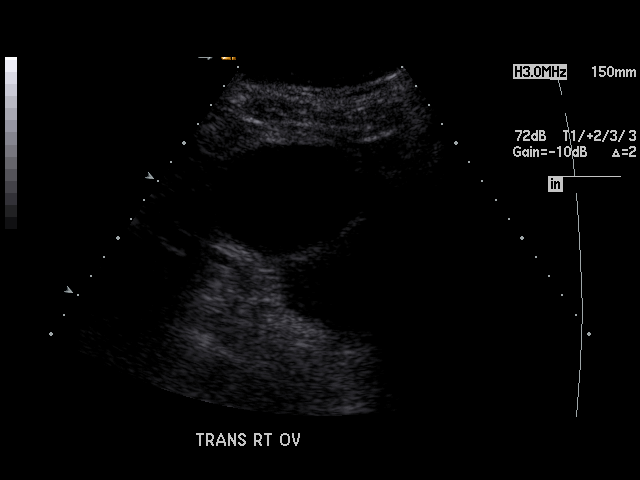
[im 8/30]
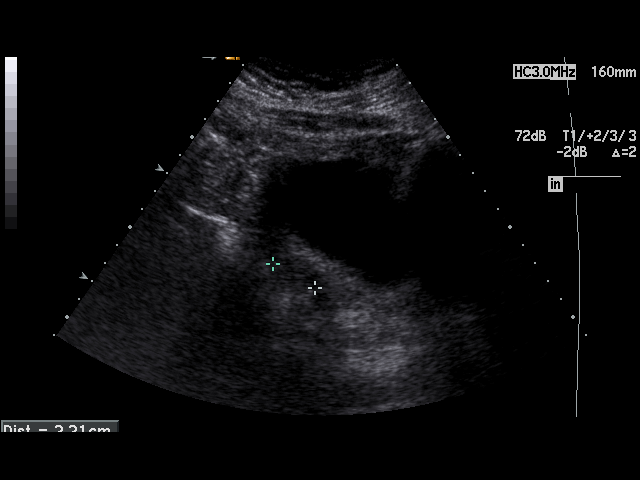
[im 10/30]
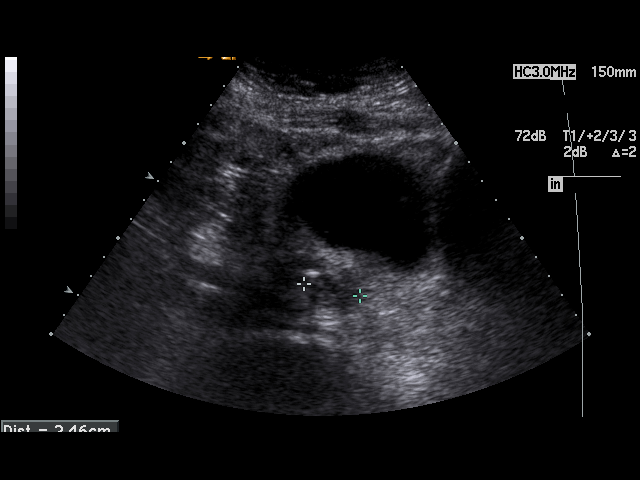
[im 11/30]
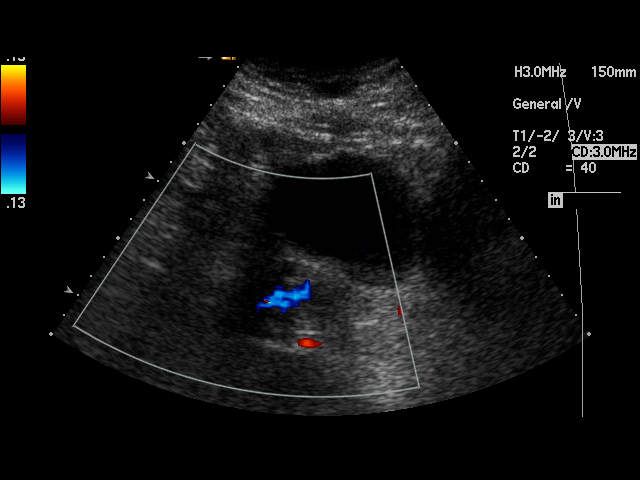
[im 14/30]
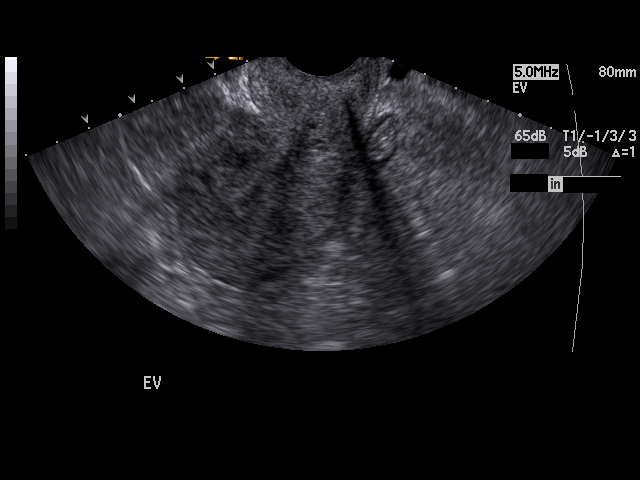
[im 15/30]
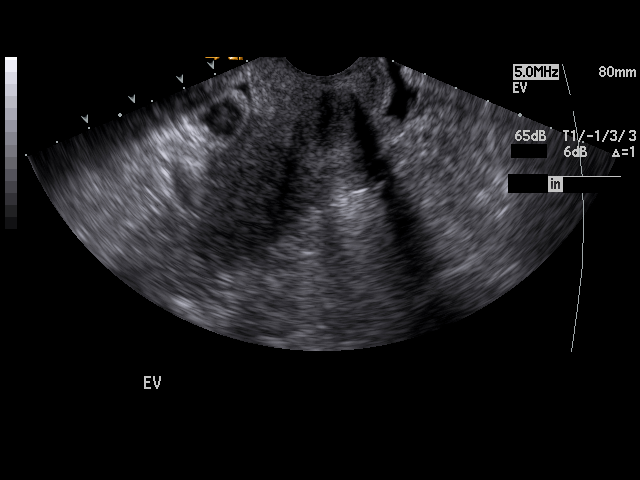
[im 16/30]
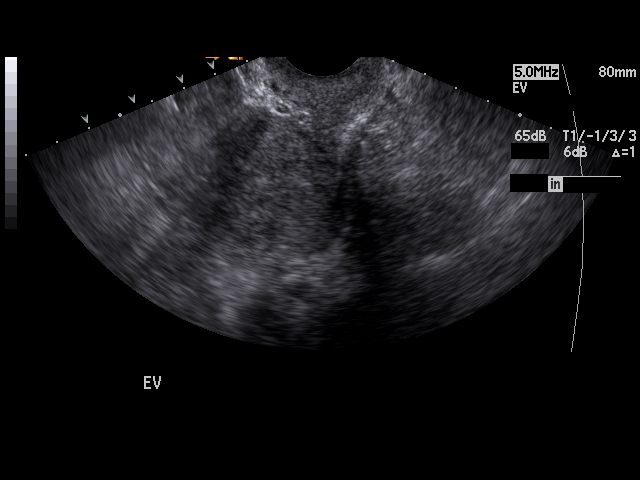
[im 19/30]
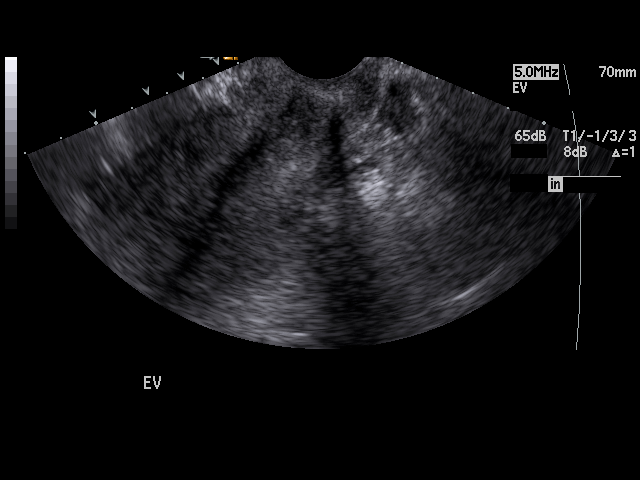
[im 20/30]
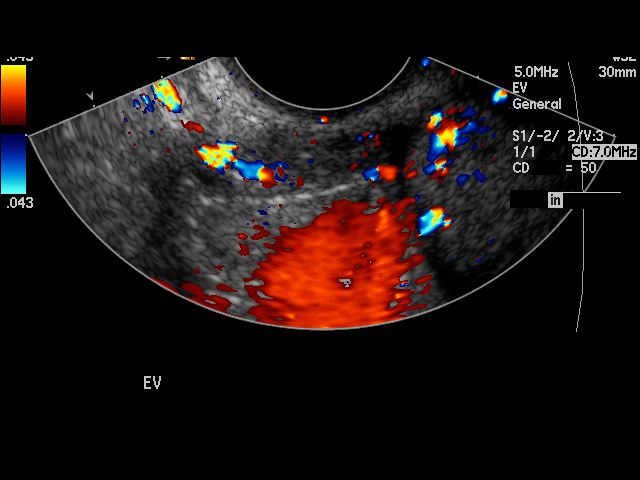
[im 22/30]
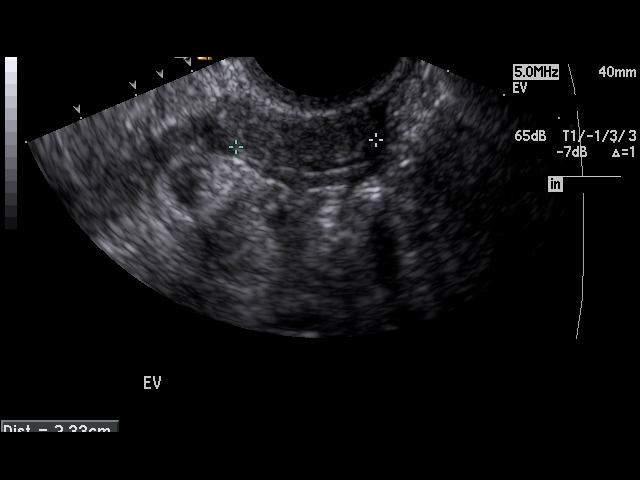
[im 23/30]
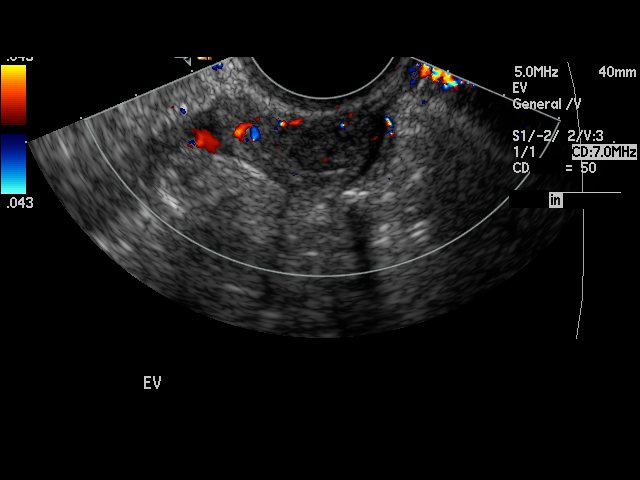
[im 26/30]
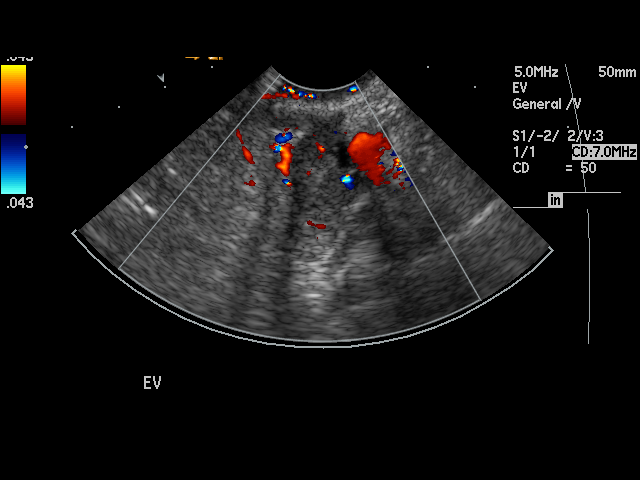
[im 27/30]
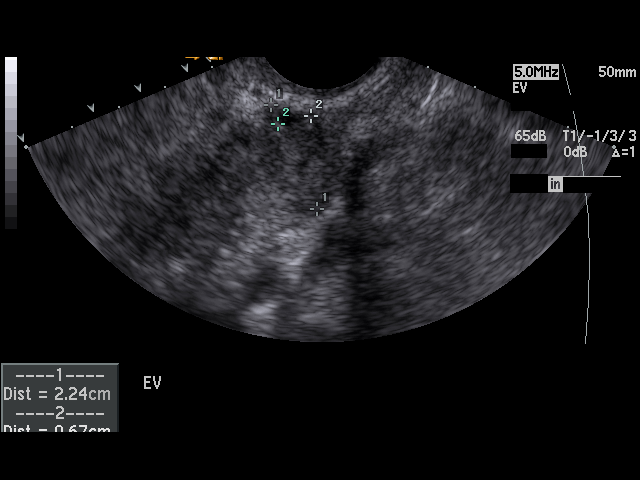
[im 30/30]
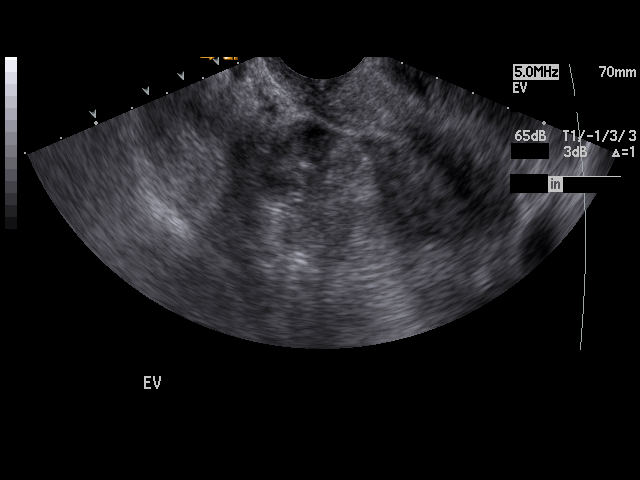

[17 of 25 positions shown; findings below may reference images not displayed]

PROCEDURE:     US  - US PELVIS MASS EXAM W/TRANSVAGI  - [DATE] [DATE]

RESULT:     Transabdominal and endovaginal ultrasound was performed.  The
uterus measures 8.51 cm x 3.13 cm x 4.16 cm. The endometrium measures 4.7 mm
in thickness. No focal uterine mass lesions are seen. The RIGHT and LEFT
ovaries are visualized. The RIGHT ovary measures 2.33 cm at maximum diameter
and the LEFT ovary measures 2.45 cm at maximum diameter. There is a 6.7 mm
cyst in the LEFT ovary. No abnormal adnexal masses. There is a nonspecific
trace of free fluid in the cul-de-sac. The visualized portion of the urinary
bladder is normal appearance. The kidneys show no hydronephrosis.
IMPRESSION: No specific abnormalities are identified.

## 2007-10-07 ENCOUNTER — Emergency Department: Payer: Self-pay | Admitting: Emergency Medicine

## 2007-10-07 ENCOUNTER — Other Ambulatory Visit: Payer: Self-pay

## 2007-10-07 IMAGING — CR DG CHEST 1V PORT
1 series · 1 of 1 positions shown · non-contrast
Comparison: none

REASON FOR EXAM: Chest pain
COMMENTS:

[view not recorded]
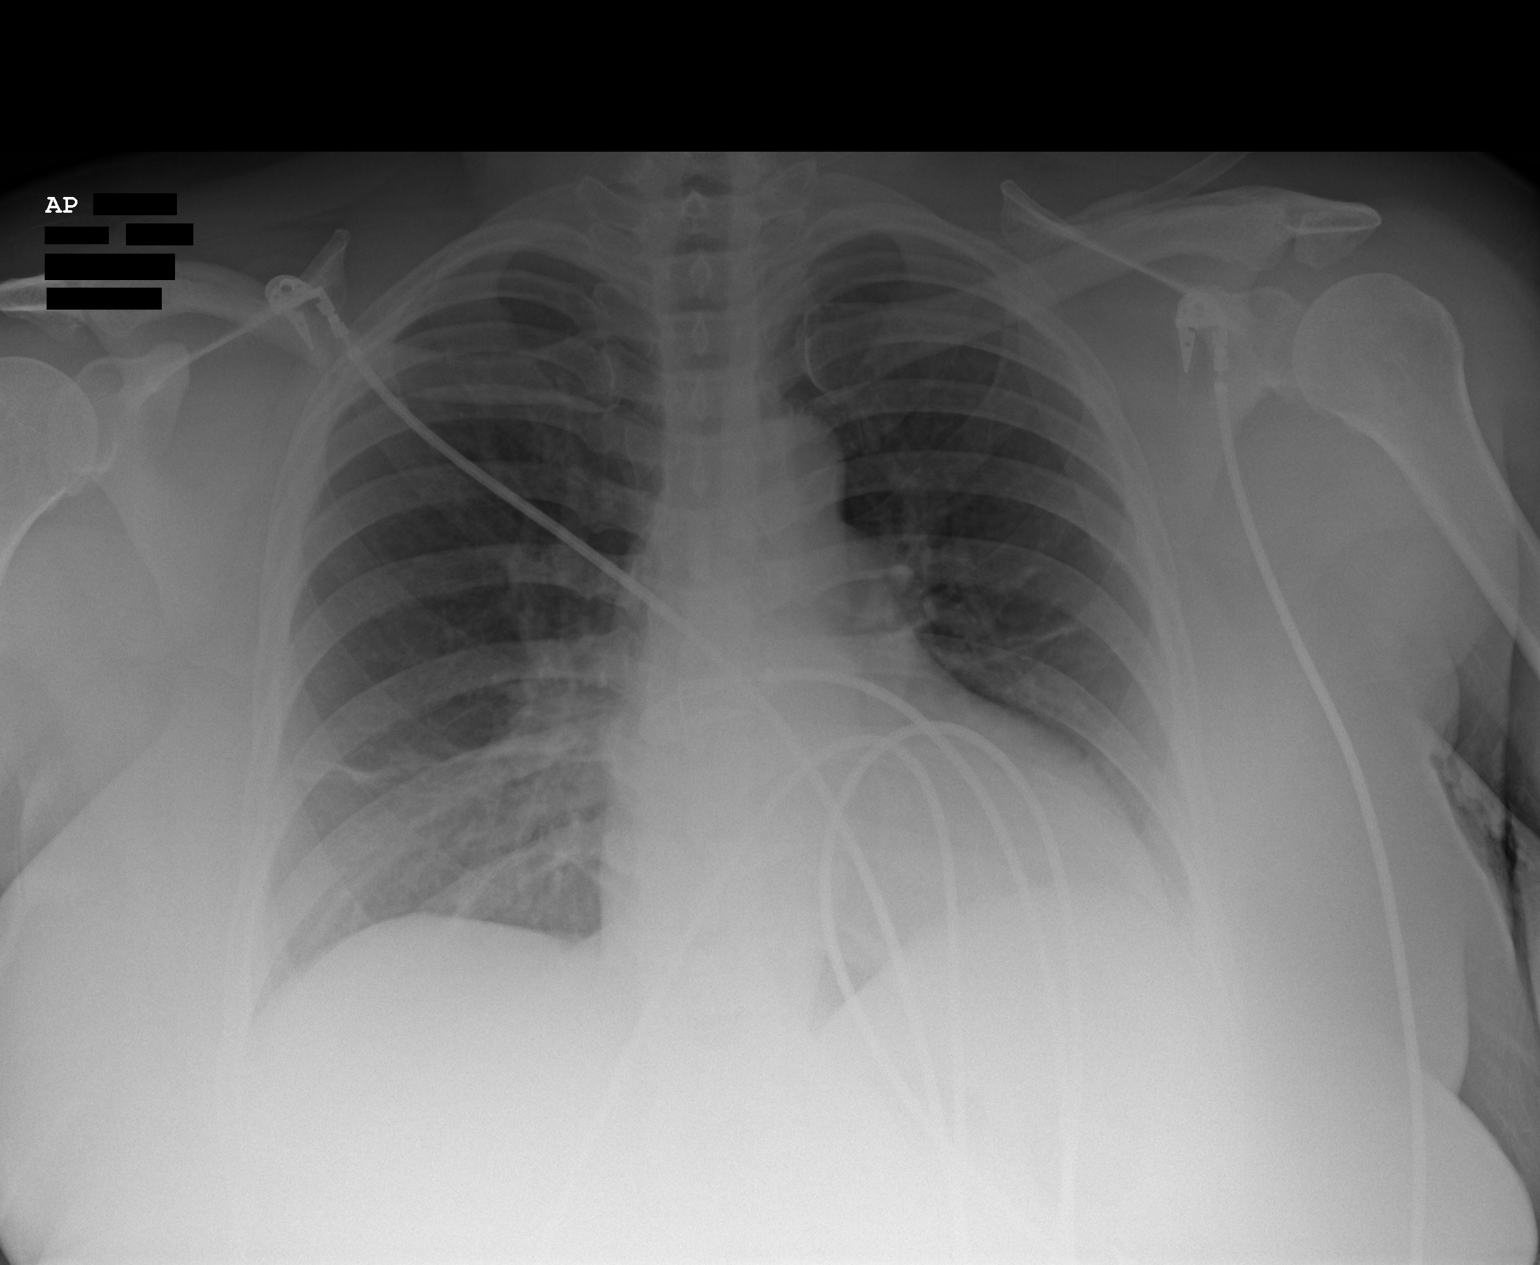

[1 of 1 positions shown; findings below may reference images not displayed]

PROCEDURE:     DXR - DXR PORTABLE CHEST SINGLE VIEW  - [DATE] [DATE]

RESULT:     Comparison is made to the most recent exam of [DATE].

There is some linear density at the RIGHT lung base which may be in the
RIGHT middle lobe or RIGHT lower lobe and is suggestive of atelectasis of a
subsegmental nature or linear fibrosis. There is no focal infiltrate, edema
or effusion. There is no pneumothorax. Cardiac monitoring electrodes are
present. The heart is at the upper limits of normal in size.
IMPRESSION: Linear density at the RIGHT lung base suggestive of
atelectasis or fibrosis. Follow-up PA and lateral views may be beneficial
for clarification.

## 2007-10-08 IMAGING — CT CT CHEST W/ CM
1 series · 16 of 33 positions shown, 20 images · IV contrast (APPLIED)
Comparison: none

REASON FOR EXAM: Shortness of breath, elevated D-Dimer
COMMENTS:

PROCEDURE:     CT  - CT CHEST (FOR PE) W  - [DATE]  [DATE]
RESULT:     The patient has a history of shortness of breath and elevated
D-Dimer.
TECHNIQUE: IV contrast enhanced chest CT is obtained.

[Series 5: soft tissue · axial · 0.74mm/px · z∈[-160,+80]mm · 16 of 88 slices shown, 20 images]
[im 4/88  mediastinal]
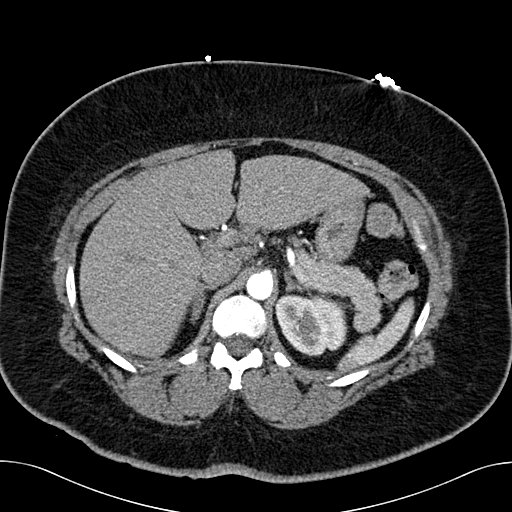
[im 4/88  lung]
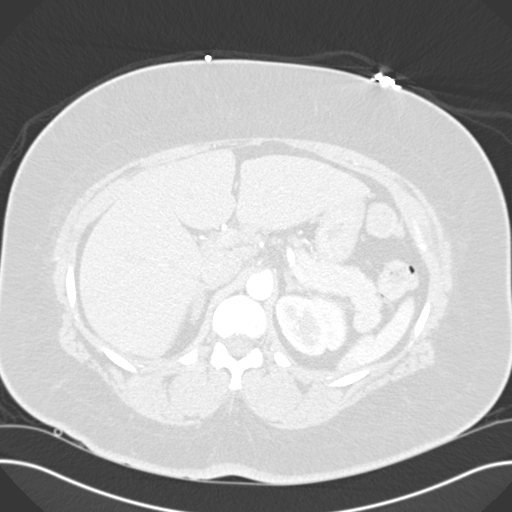
[im 10/88  lung]
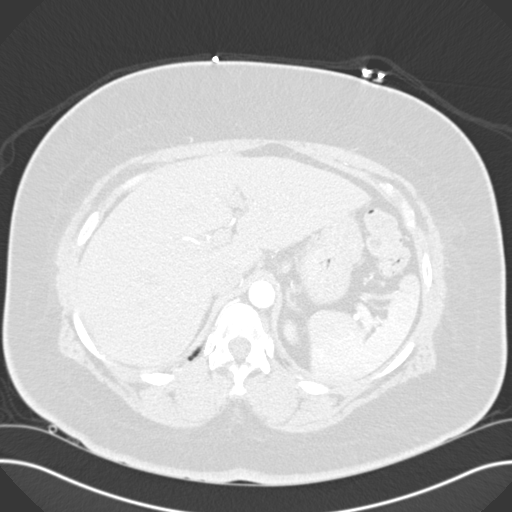
[im 17/88  lung]
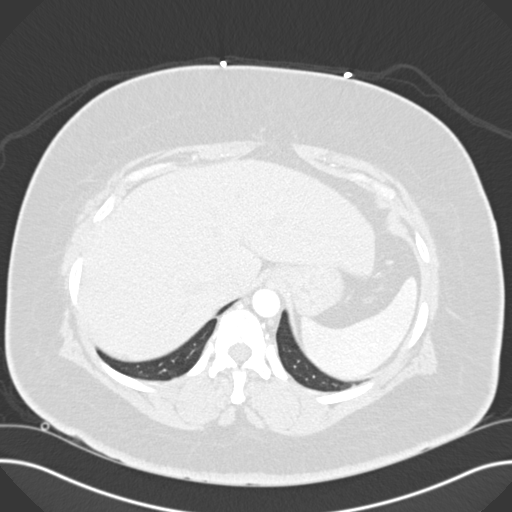
[im 20/88  lung]
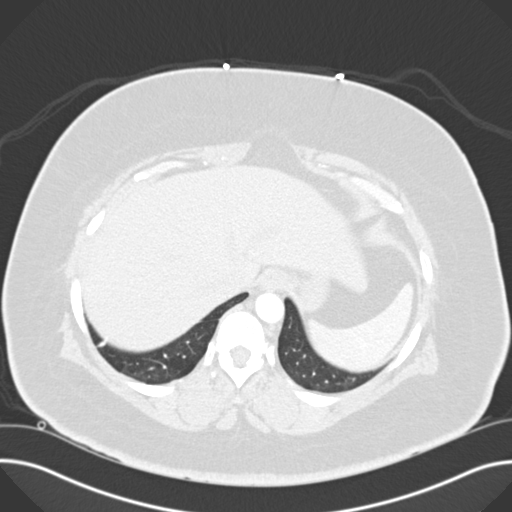
[im 26/88  mediastinal]
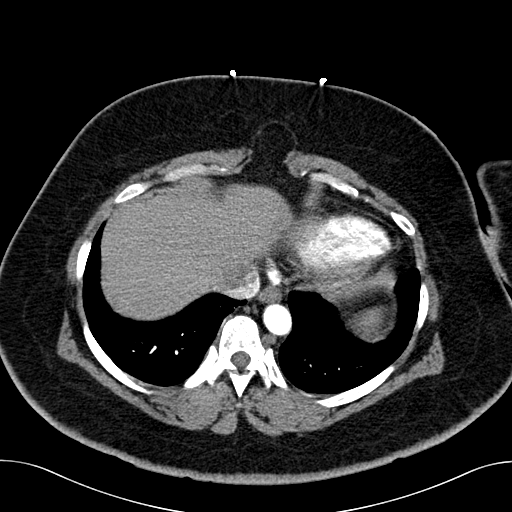
[im 26/88  lung]
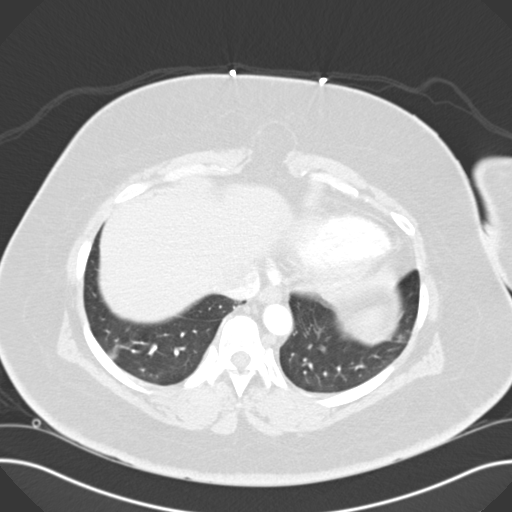
[im 33/88  lung]
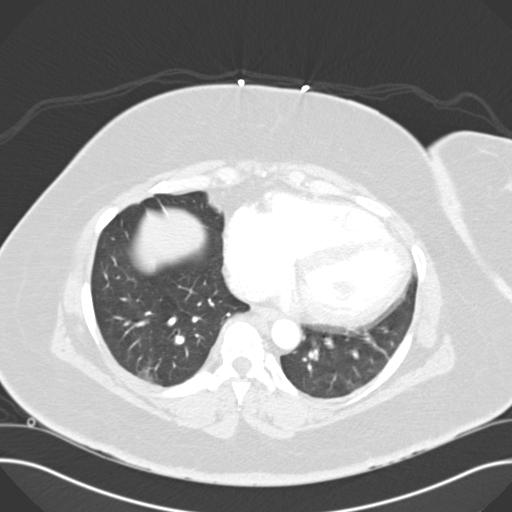
[im 36/88  lung]
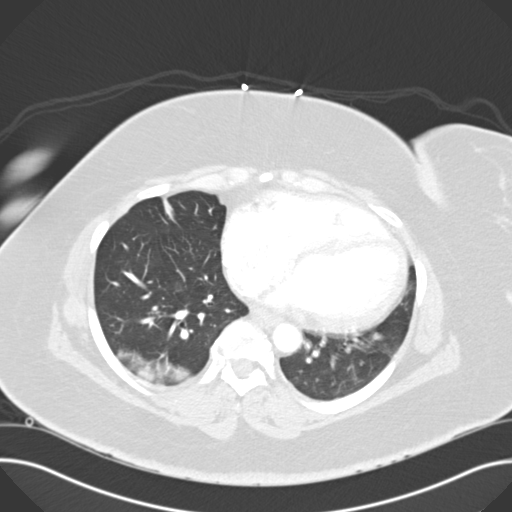
[im 42/88  lung]
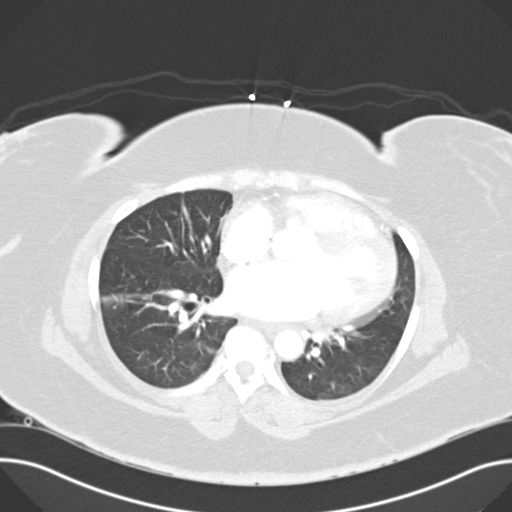
[im 47/88  mediastinal]
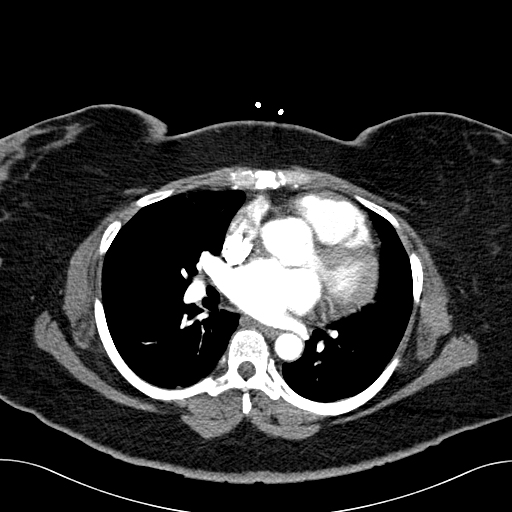
[im 47/88  lung]
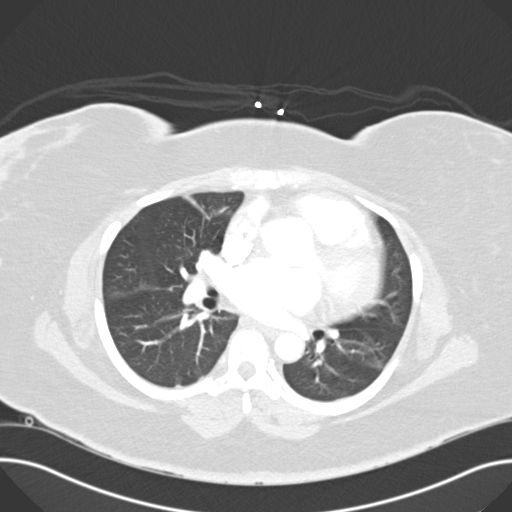
[im 52/88  lung]
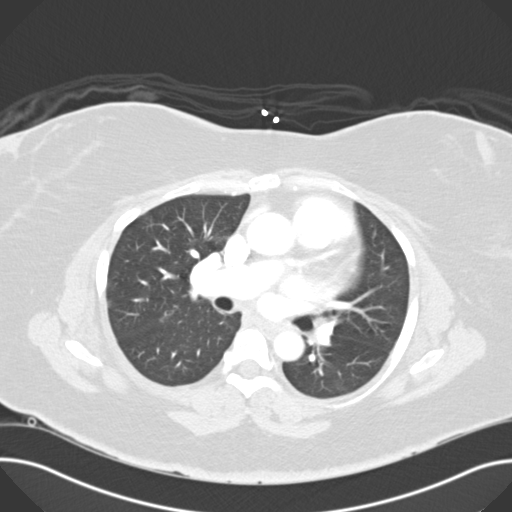
[im 55/88  lung]
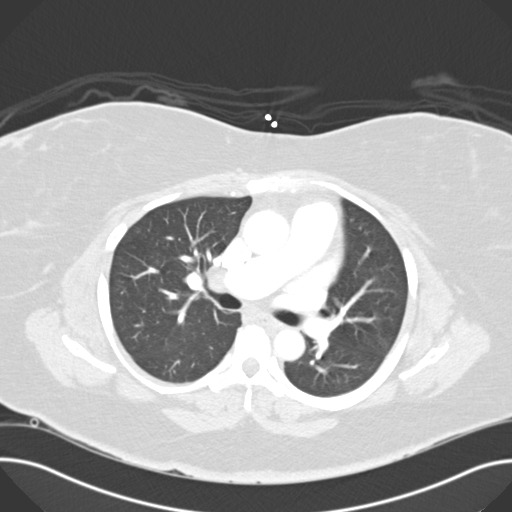
[im 62/88  lung]
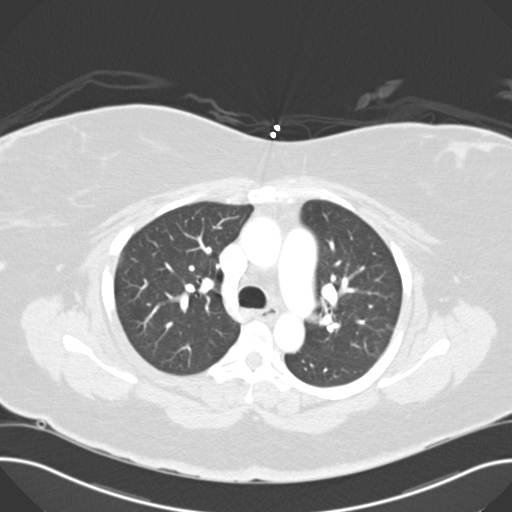
[im 68/88  mediastinal]
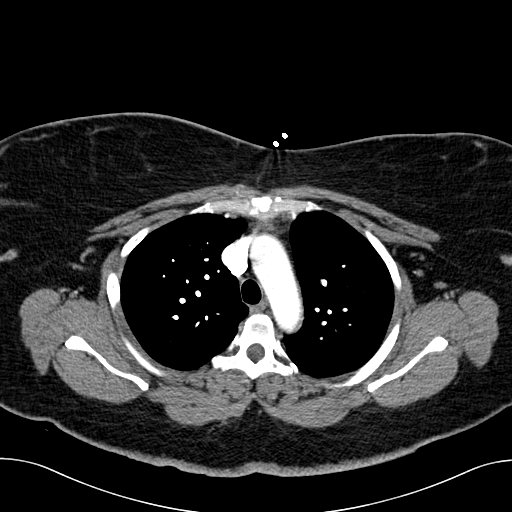
[im 68/88  lung]
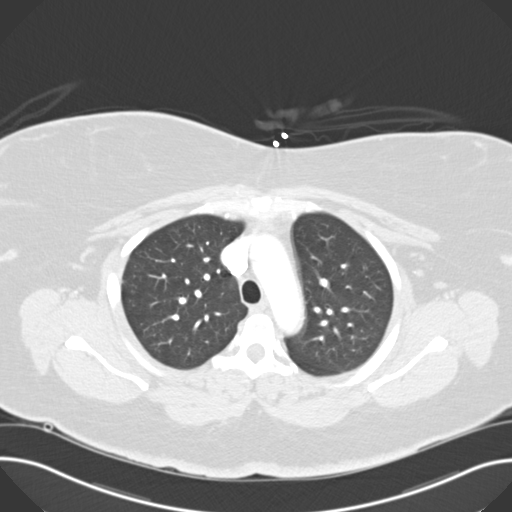
[im 71/88  lung]
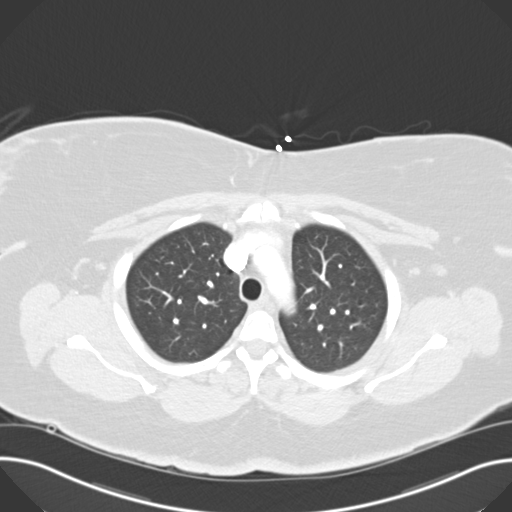
[im 78/88  lung]
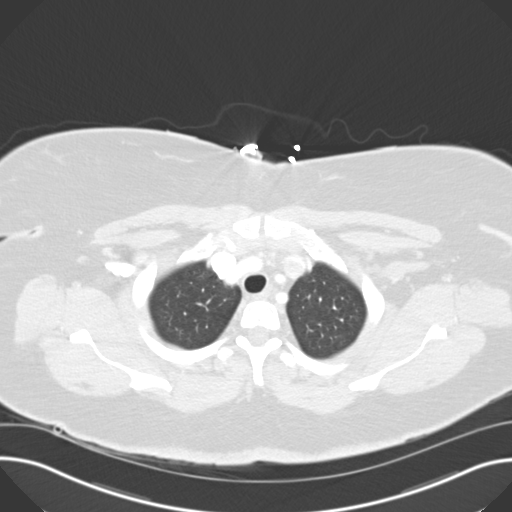
[im 84/88  lung]
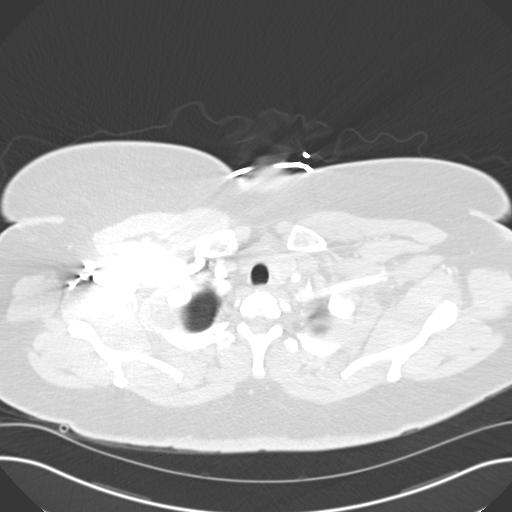

[16 of 33 positions shown; findings below may reference images not displayed]

FINDINGS: The thoracic aorta is normal. The pulmonary arteries are normal.
Tiny, questionable filling defect is noted in a LEFT lower lobe pulmonary
vein. This may be related to venous bifurcation. This is doubtful of
representing pulmonary embolus. There is poor enhancement of several lower
segmental branches and pulmonary embolus cannot be excluded. No prominent
pulmonary embolus is noted. There is cardiomegaly. The adrenals are normal.
The large airways are patent. Basilar atelectasis and/or pneumonia is noted.
IMPRESSION: 1.     No definite evidence of pulmonary embolus.
2.     No acute abnormality identified.
3.     Coronary artery disease appears to be present incidentally.

## 2007-11-04 ENCOUNTER — Emergency Department: Payer: Self-pay | Admitting: Emergency Medicine

## 2007-12-04 ENCOUNTER — Emergency Department: Payer: Self-pay

## 2007-12-04 ENCOUNTER — Other Ambulatory Visit: Payer: Self-pay
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
## 2007-12-19 ENCOUNTER — Other Ambulatory Visit: Payer: Self-pay

## 2007-12-19 ENCOUNTER — Emergency Department: Payer: Self-pay | Admitting: Emergency Medicine

## 2007-12-19 IMAGING — CR DG CHEST 1V PORT
1 series · 1 of 1 positions shown · non-contrast
Comparison: none

REASON FOR EXAM: shortness of breath, cough, leg swelling
COMMENTS:

[view not recorded]
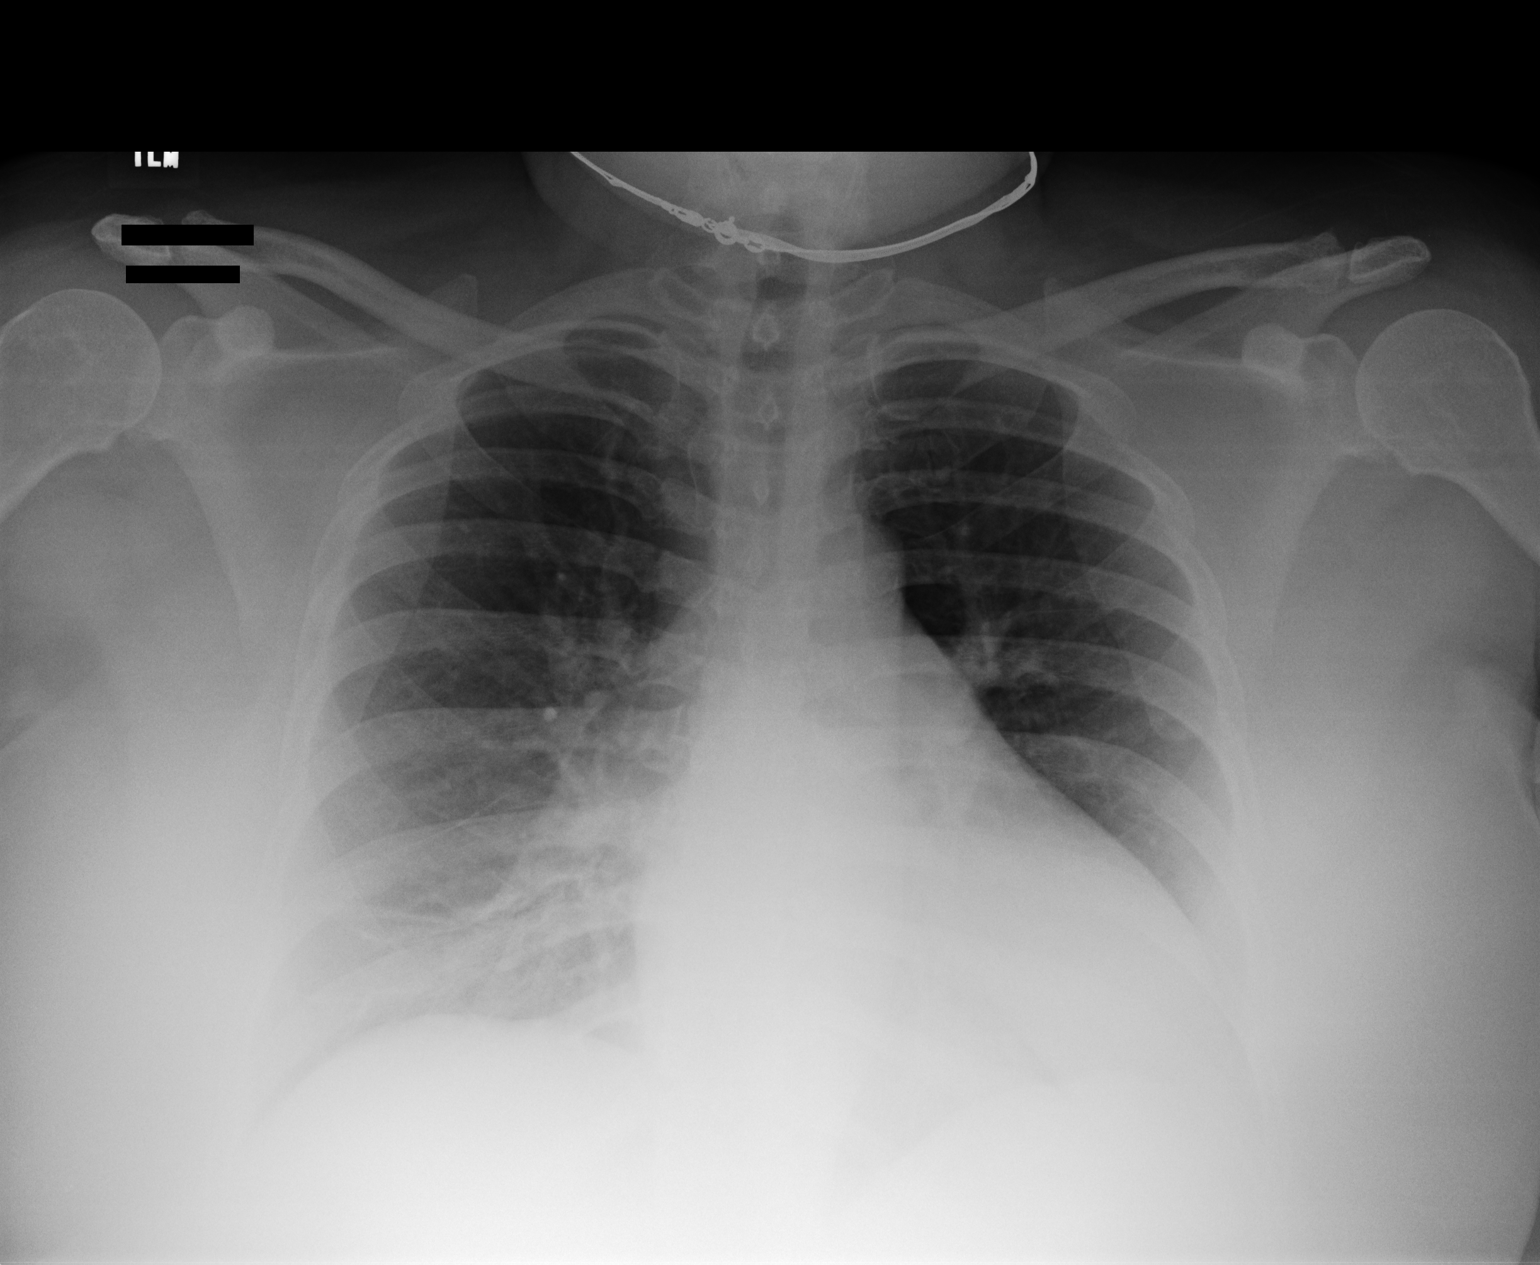

[1 of 1 positions shown; findings below may reference images not displayed]

PROCEDURE:     DXR - DXR PORTABLE CHEST SINGLE VIEW  - [DATE] [DATE]

RESULT:     Comparison is made to the prior exam of [DATE]. The
inspiratory level is less than optimal. The lung fields are clear except for
a few linear densities at the RIGHT base compatible with fibrotic strands.
The heart, mediastinal and osseous structures show no significant
abnormalities.
IMPRESSION: No acute changes are identified.

## 2008-01-31 ENCOUNTER — Inpatient Hospital Stay: Payer: Self-pay | Admitting: Internal Medicine

## 2008-01-31 IMAGING — CR DG CHEST 1V PORT
1 series · 1 of 1 positions shown · non-contrast
Comparison: none

REASON FOR EXAM: pancreatis, chest pain
COMMENTS:

PROCEDURE:     DXR - DXR PORTABLE CHEST SINGLE VIEW  - [DATE]  [DATE]
RESULT:     Comparison: [DATE]

[view not recorded]
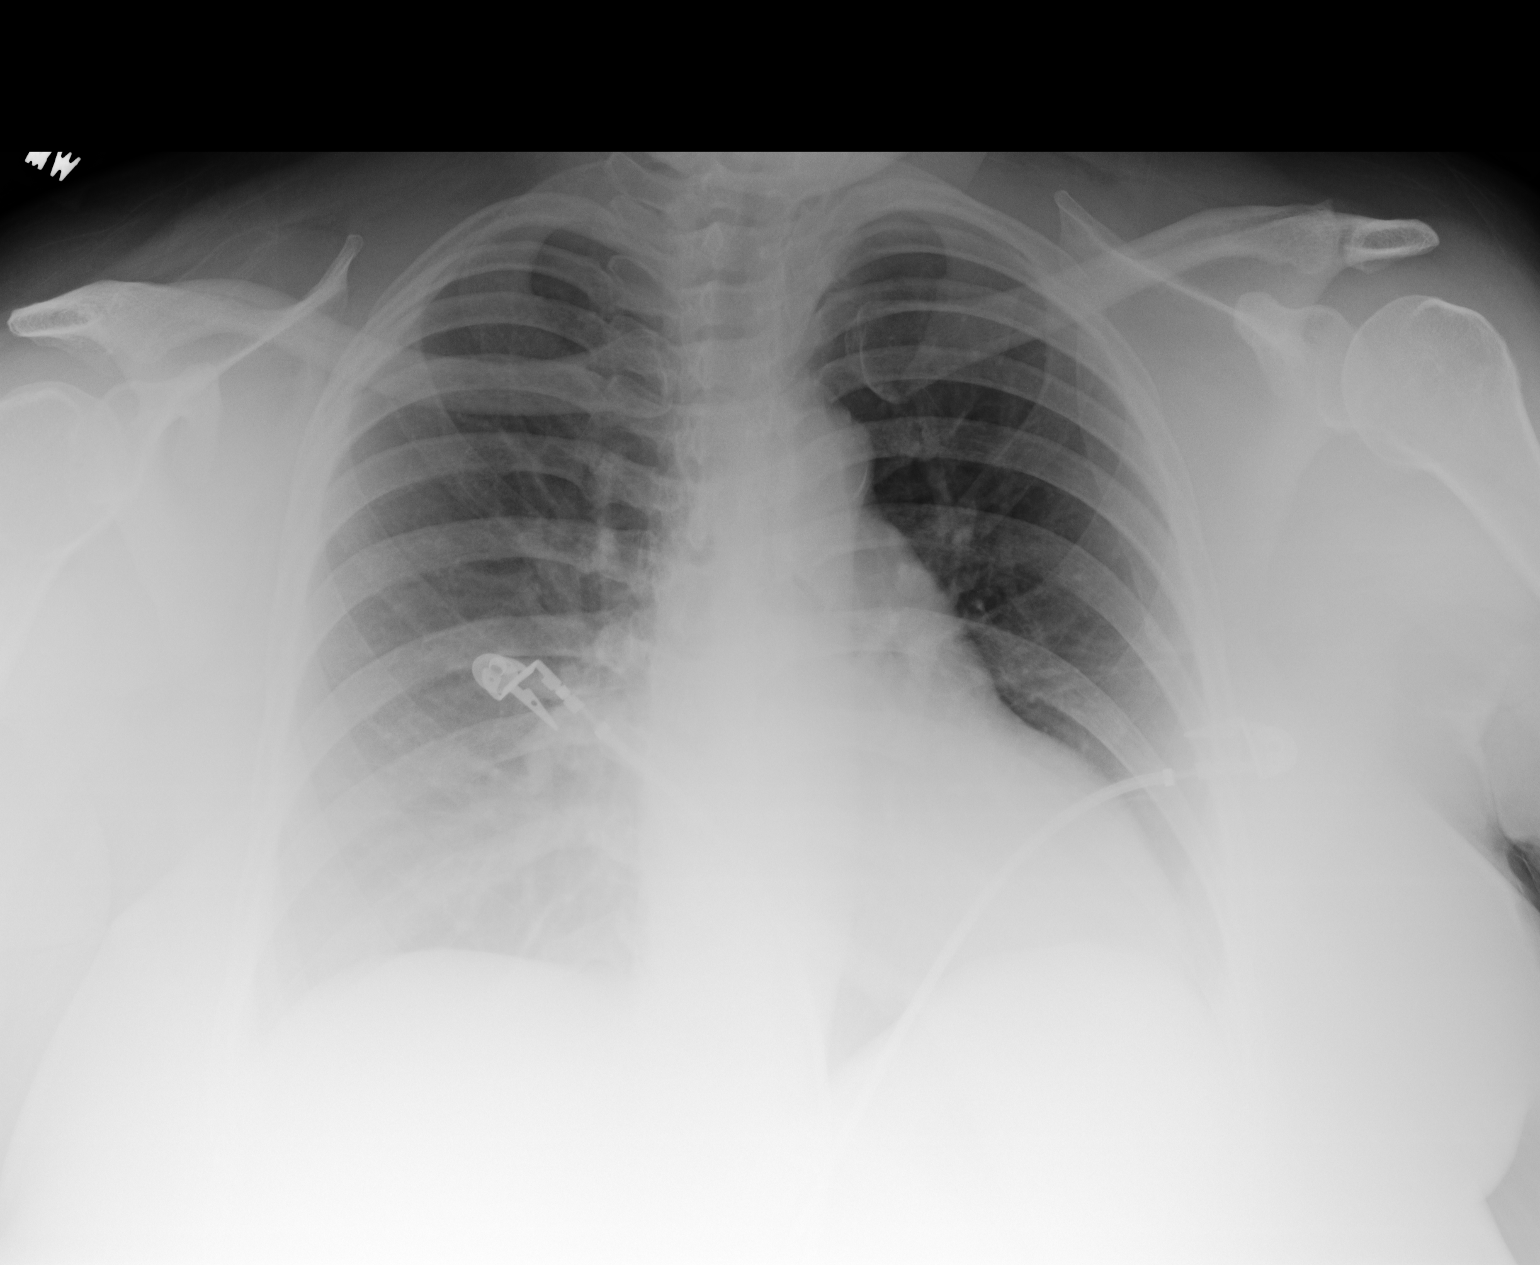

[1 of 1 positions shown; findings below may reference images not displayed]

FINDINGS: Single portable AP chest radiograph is provided. There is no focal
parenchymal opacity, pleural effusion, or pneumothorax. Normal
cardiomediastinal silhouette. The osseous structures are unremarkable.
IMPRESSION: No acute disease of the chest.

## 2008-02-01 IMAGING — US ABDOMEN ULTRASOUND
1 series · 17 of 25 positions shown · non-contrast
Comparison: none

REASON FOR EXAM: Pancreatitis
COMMENTS:

[Series 1: abdomen ultrasound · 17 of 48 slices shown]
[im 1/48]
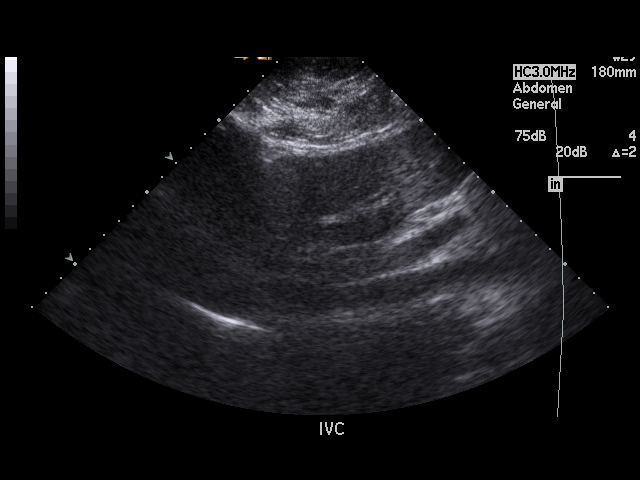
[im 4/48]
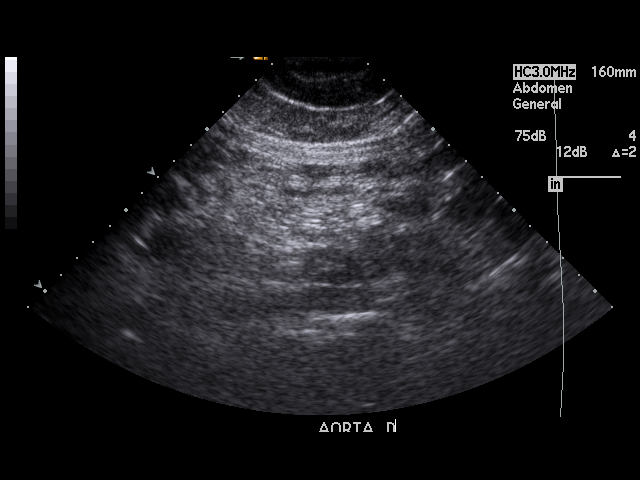
[im 6/48]
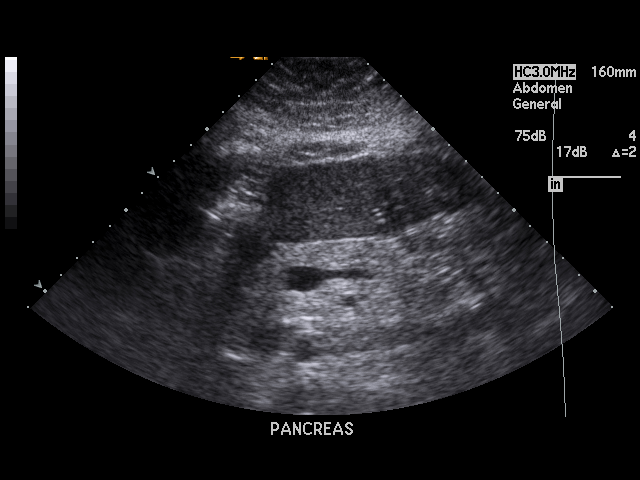
[im 10/48]
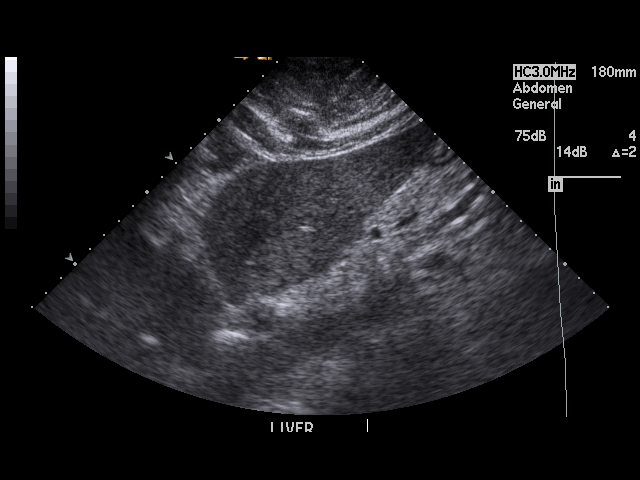
[im 12/48]
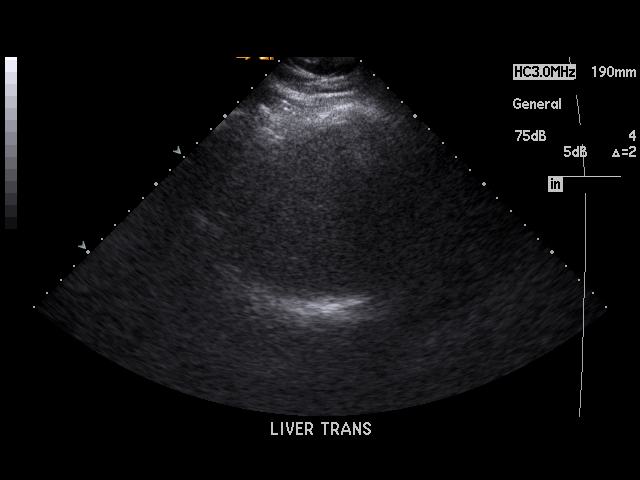
[im 16/48]
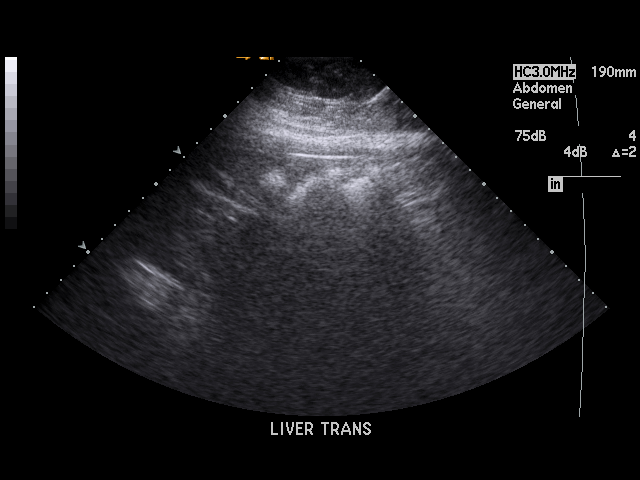
[im 18/48]
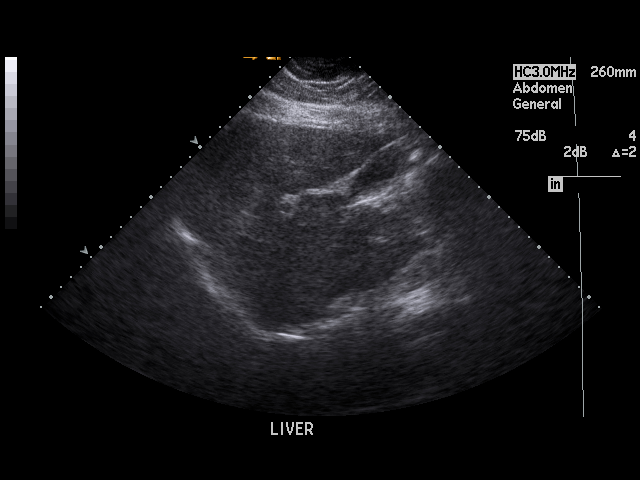
[im 22/48]
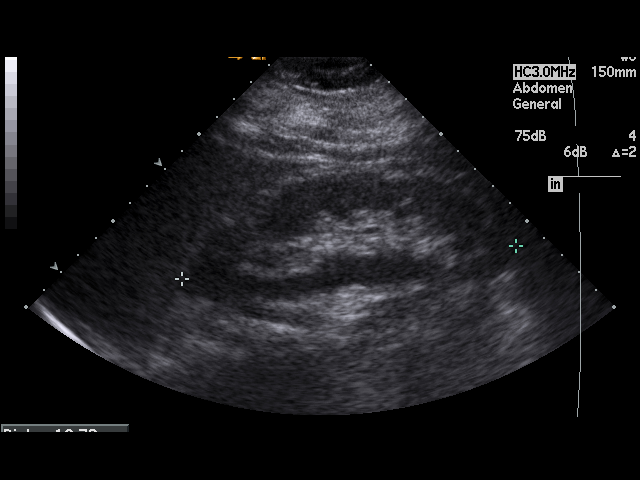
[im 24/48]
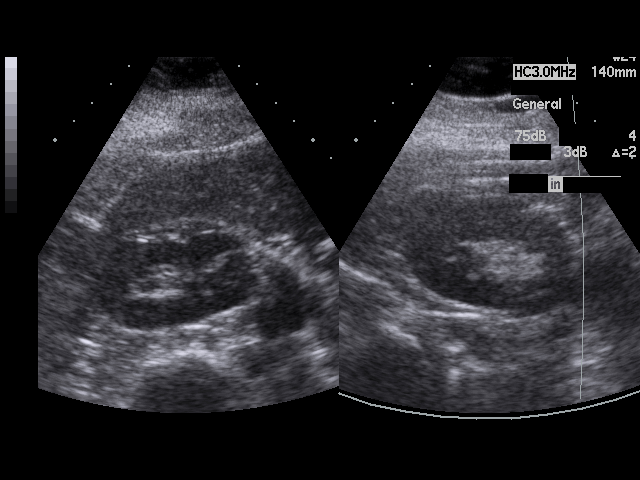
[im 26/48]
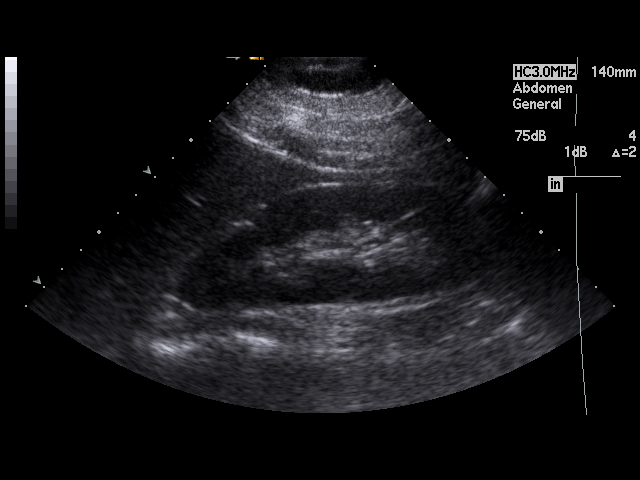
[im 30/48]
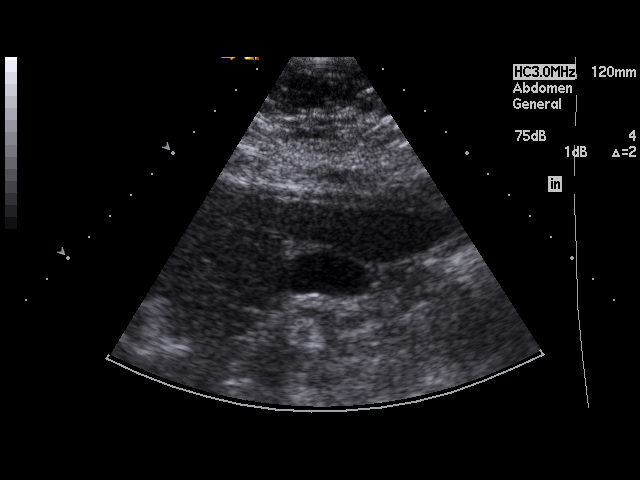
[im 32/48]
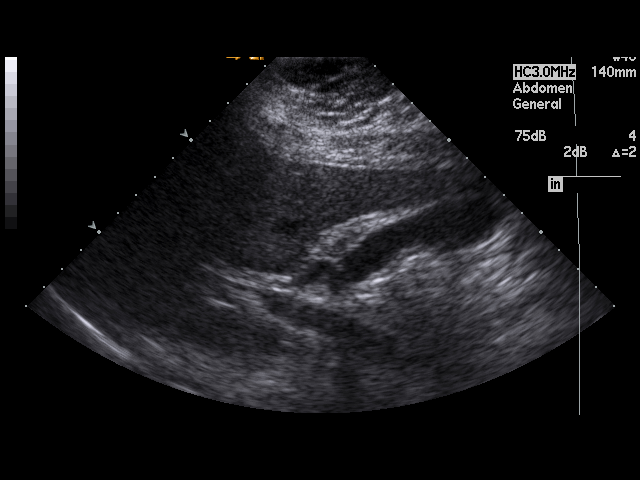
[im 36/48]
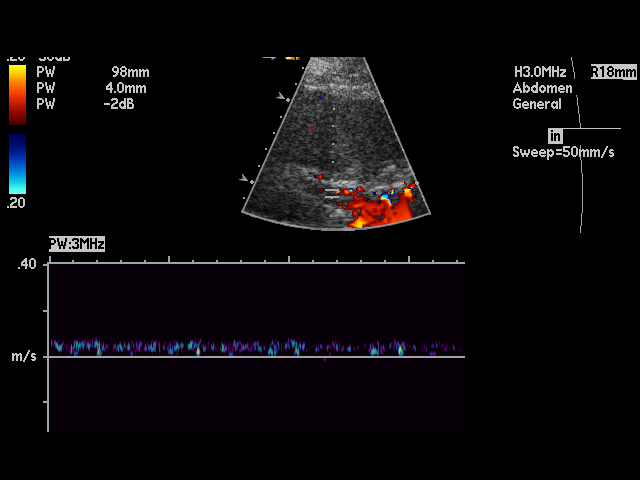
[im 38/48]
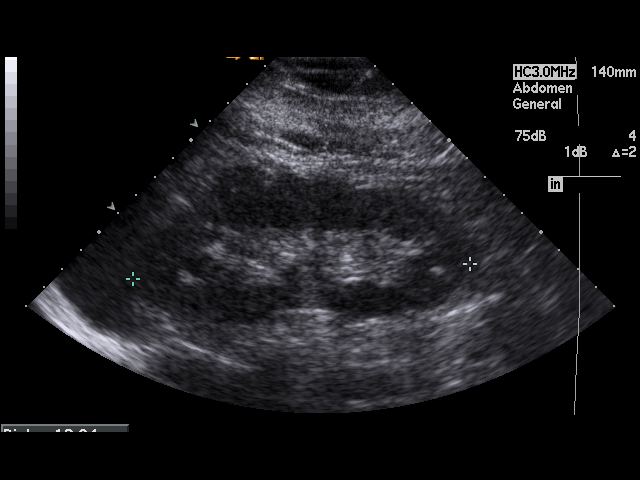
[im 42/48]
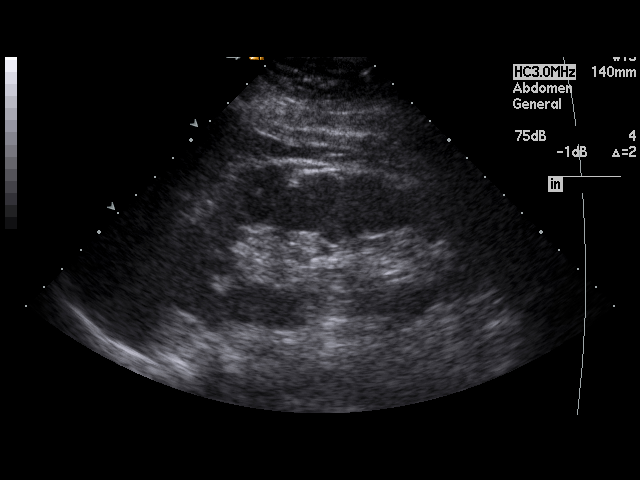
[im 44/48]
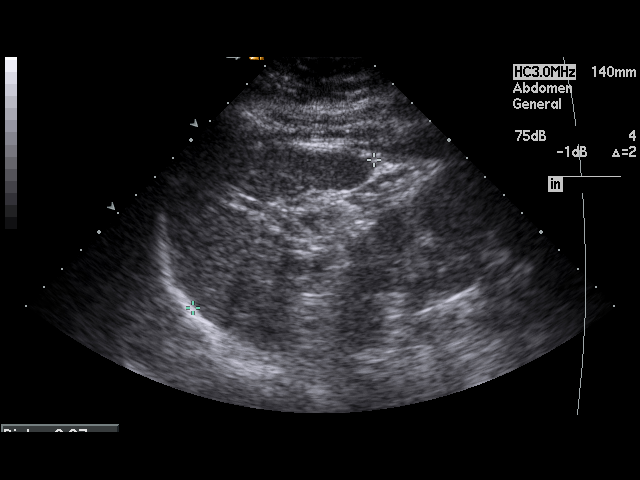
[im 48/48]
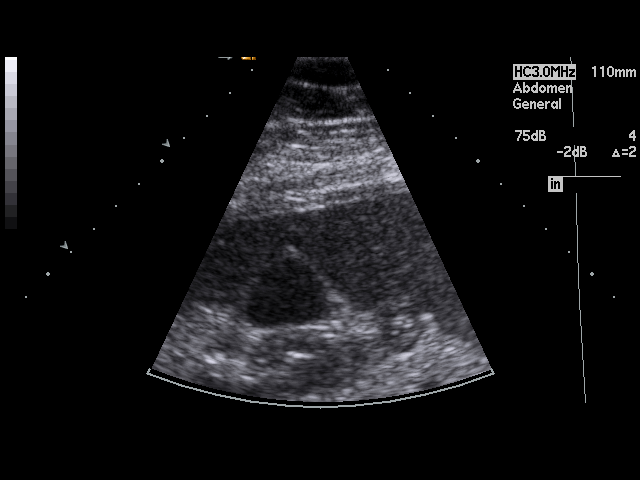

[17 of 25 positions shown; findings below may reference images not displayed]

PROCEDURE:     US  - US ABDOMEN GENERAL SURVEY  - [DATE]  [DATE]

RESULT:     The liver, spleen, abdominal aorta and inferior vena cava show
no significant abnormalities. The pancreas is not optimally seen but the
visualized portions are normal in appearance. No gallstones are seen. There
is no thickening of the gallbladder wall. The common bile duct measures
mm in diameter which is within normal limits. The kidneys show no
hydronephrosis. There is no ascites.
IMPRESSION: 1.  No significant abnormalities are identified.
2.  The pancreas is not optimally visualized on this exam but the visualized
portion shows no significant abnormalities. No pseudocyst formation is seen.
3.  There is no ascites.

## 2008-03-31 ENCOUNTER — Emergency Department: Payer: Self-pay | Admitting: Emergency Medicine

## 2008-04-07 ENCOUNTER — Emergency Department: Payer: Self-pay | Admitting: Emergency Medicine

## 2008-04-23 IMAGING — US US OUTSIDE FILMS BREAST
1 series · 10 of 10 positions shown · non-contrast
Comparison: none

[Series 1: us outside films breast · 10 of 10 slices shown]
[im 1/10]
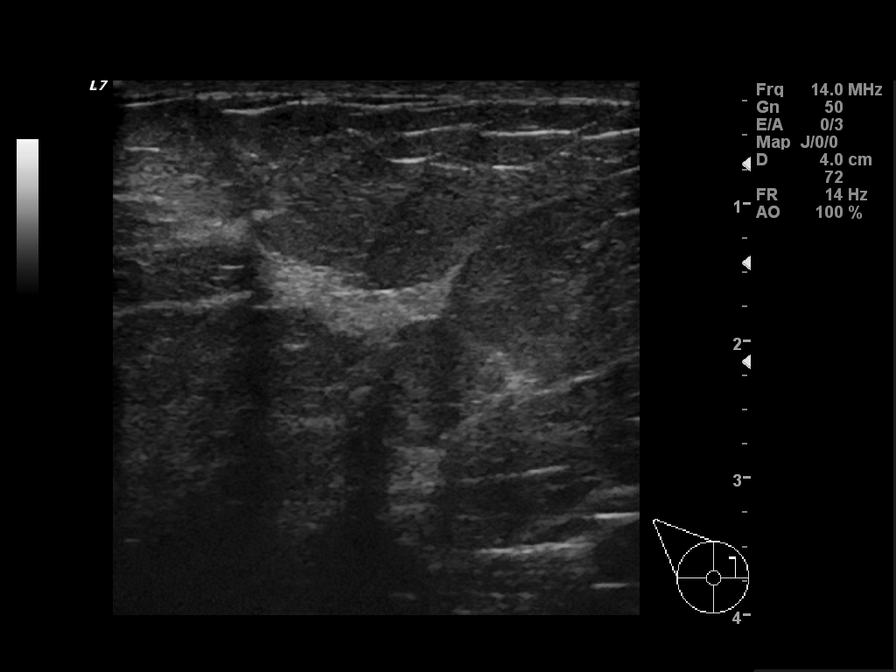
[im 2/10]
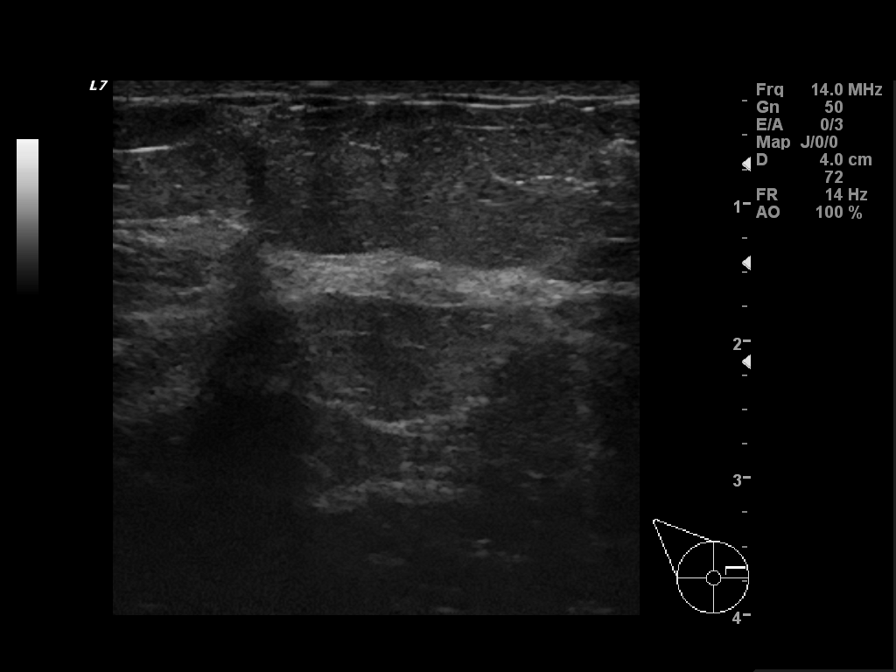
[im 3/10]
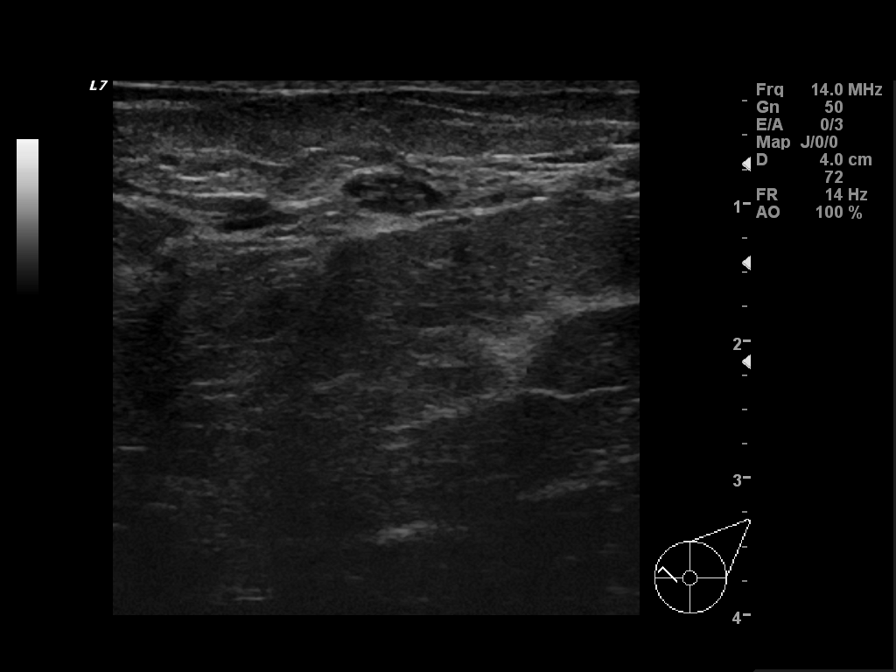
[im 4/10]
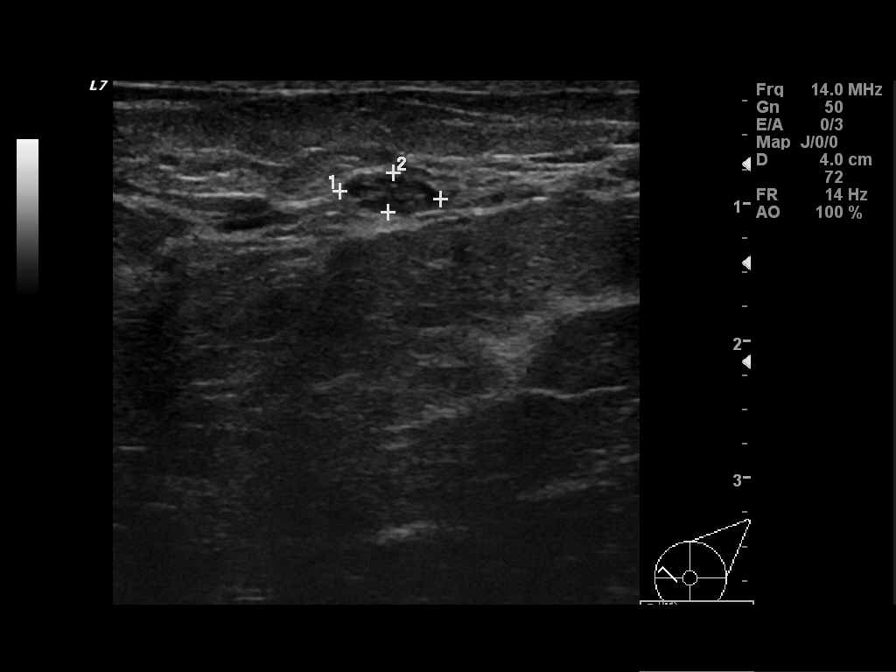
[im 5/10]
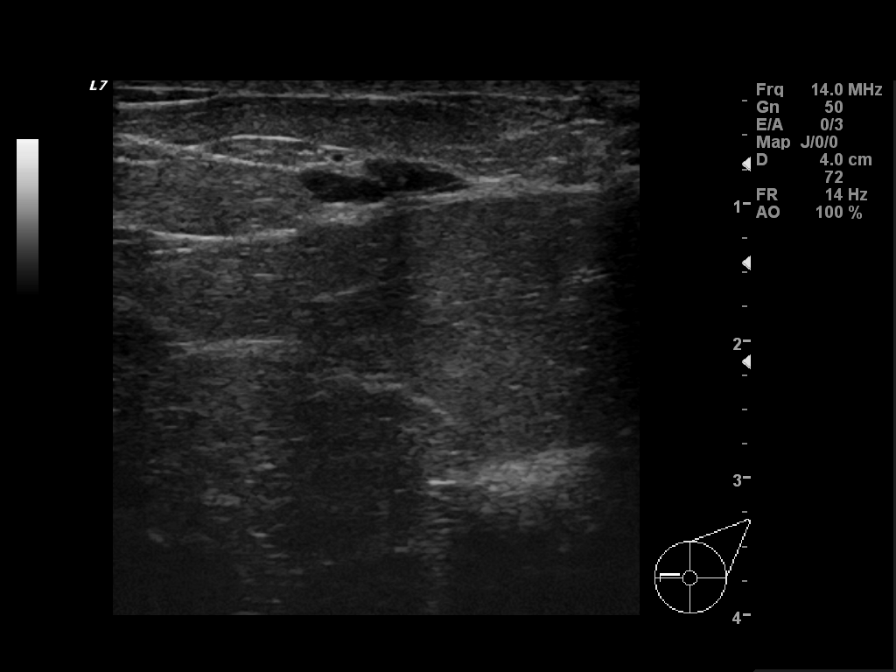
[im 6/10]
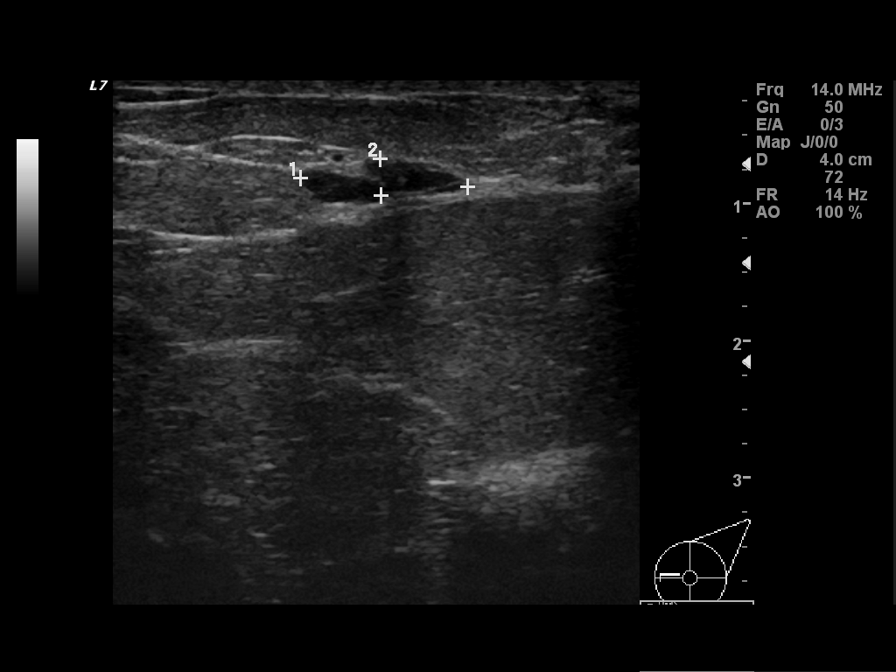
[im 7/10]
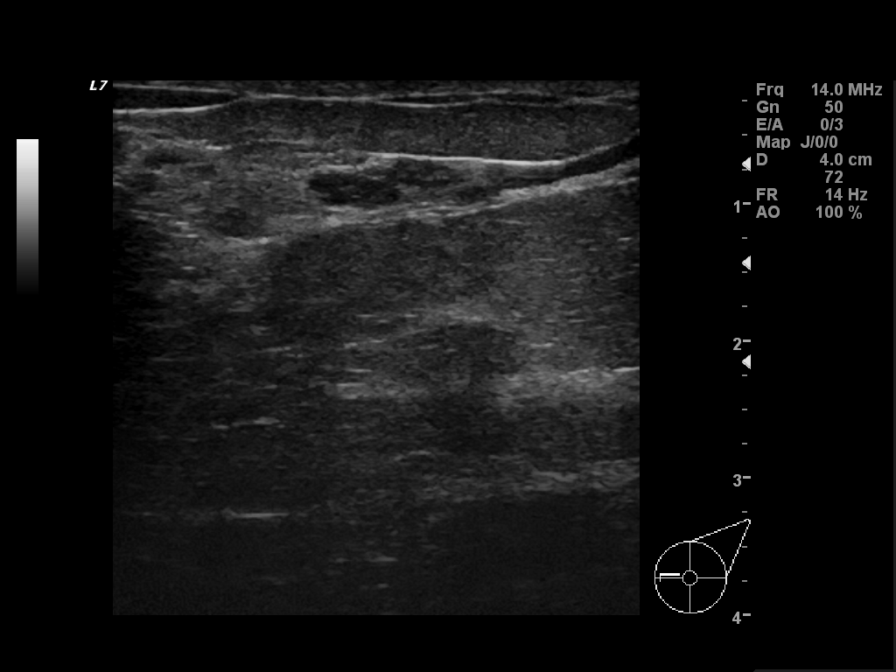
[im 8/10]
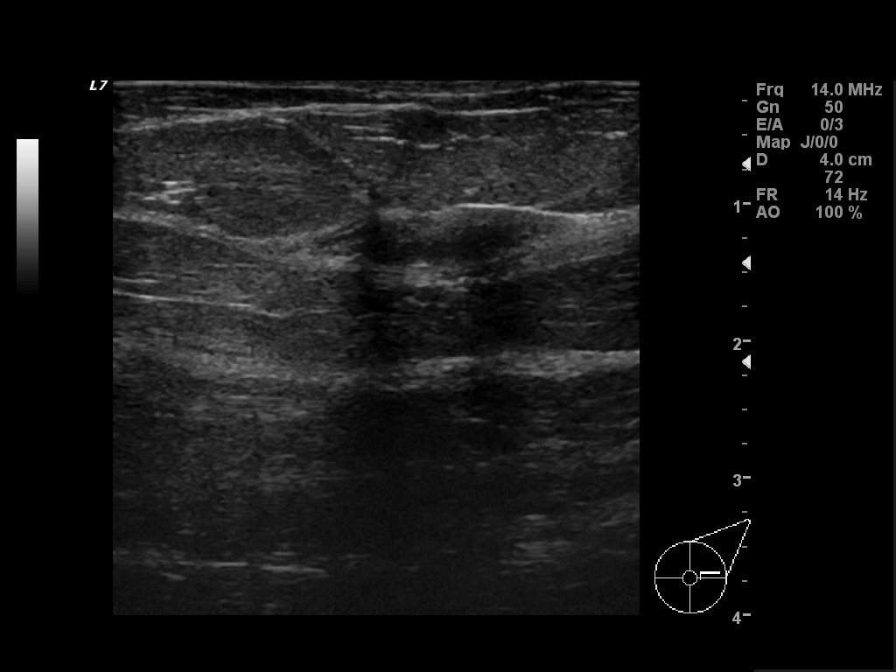
[im 9/10]
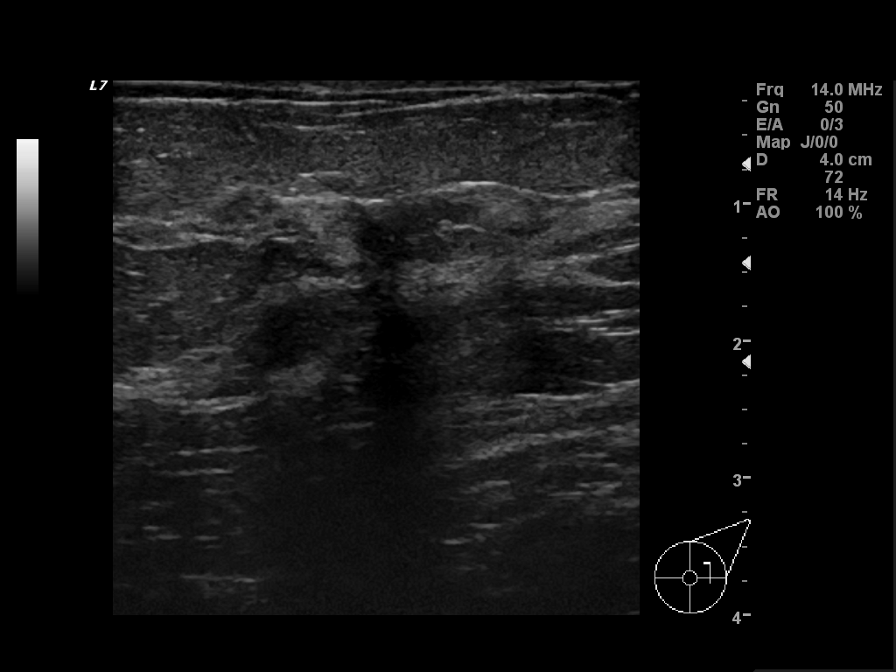
[im 10/10]
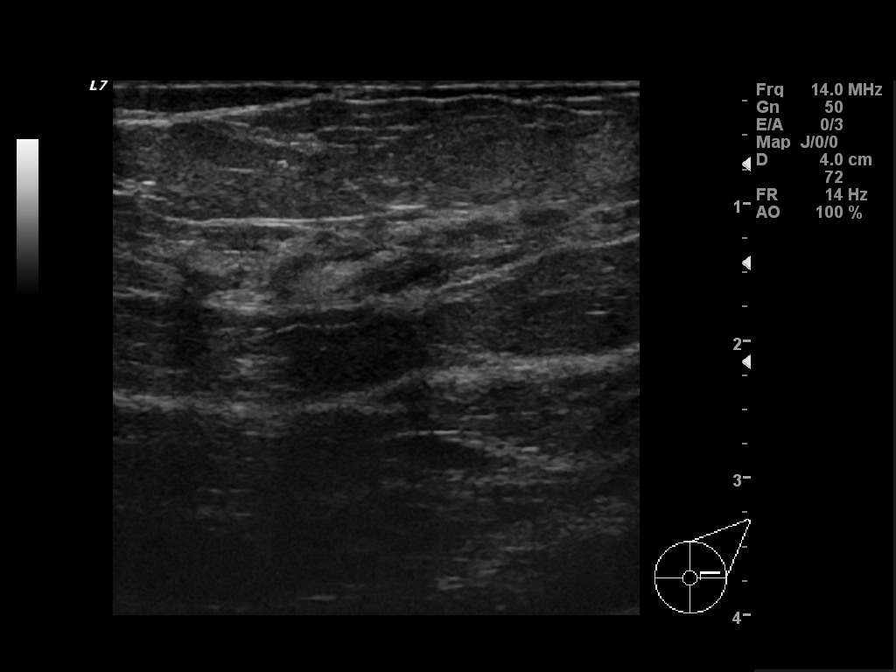

[10 of 10 positions shown; findings below may reference images not displayed]

IMAGES IMPORTED FROM THE [employer] WORK[employer]OW SYSTEM
NO DICTATION FOR STUDY

## 2008-07-15 ENCOUNTER — Emergency Department: Payer: Self-pay | Admitting: Emergency Medicine

## 2008-07-15 IMAGING — CR DG CHEST 2V
1 series · 2 of 2 positions shown · non-contrast
Comparison: none

REASON FOR EXAM: cough and wheezing
COMMENTS:

[Series 1: view not recorded · 0.17mm/px · 2 of 2 slices shown]
[im 1/2]
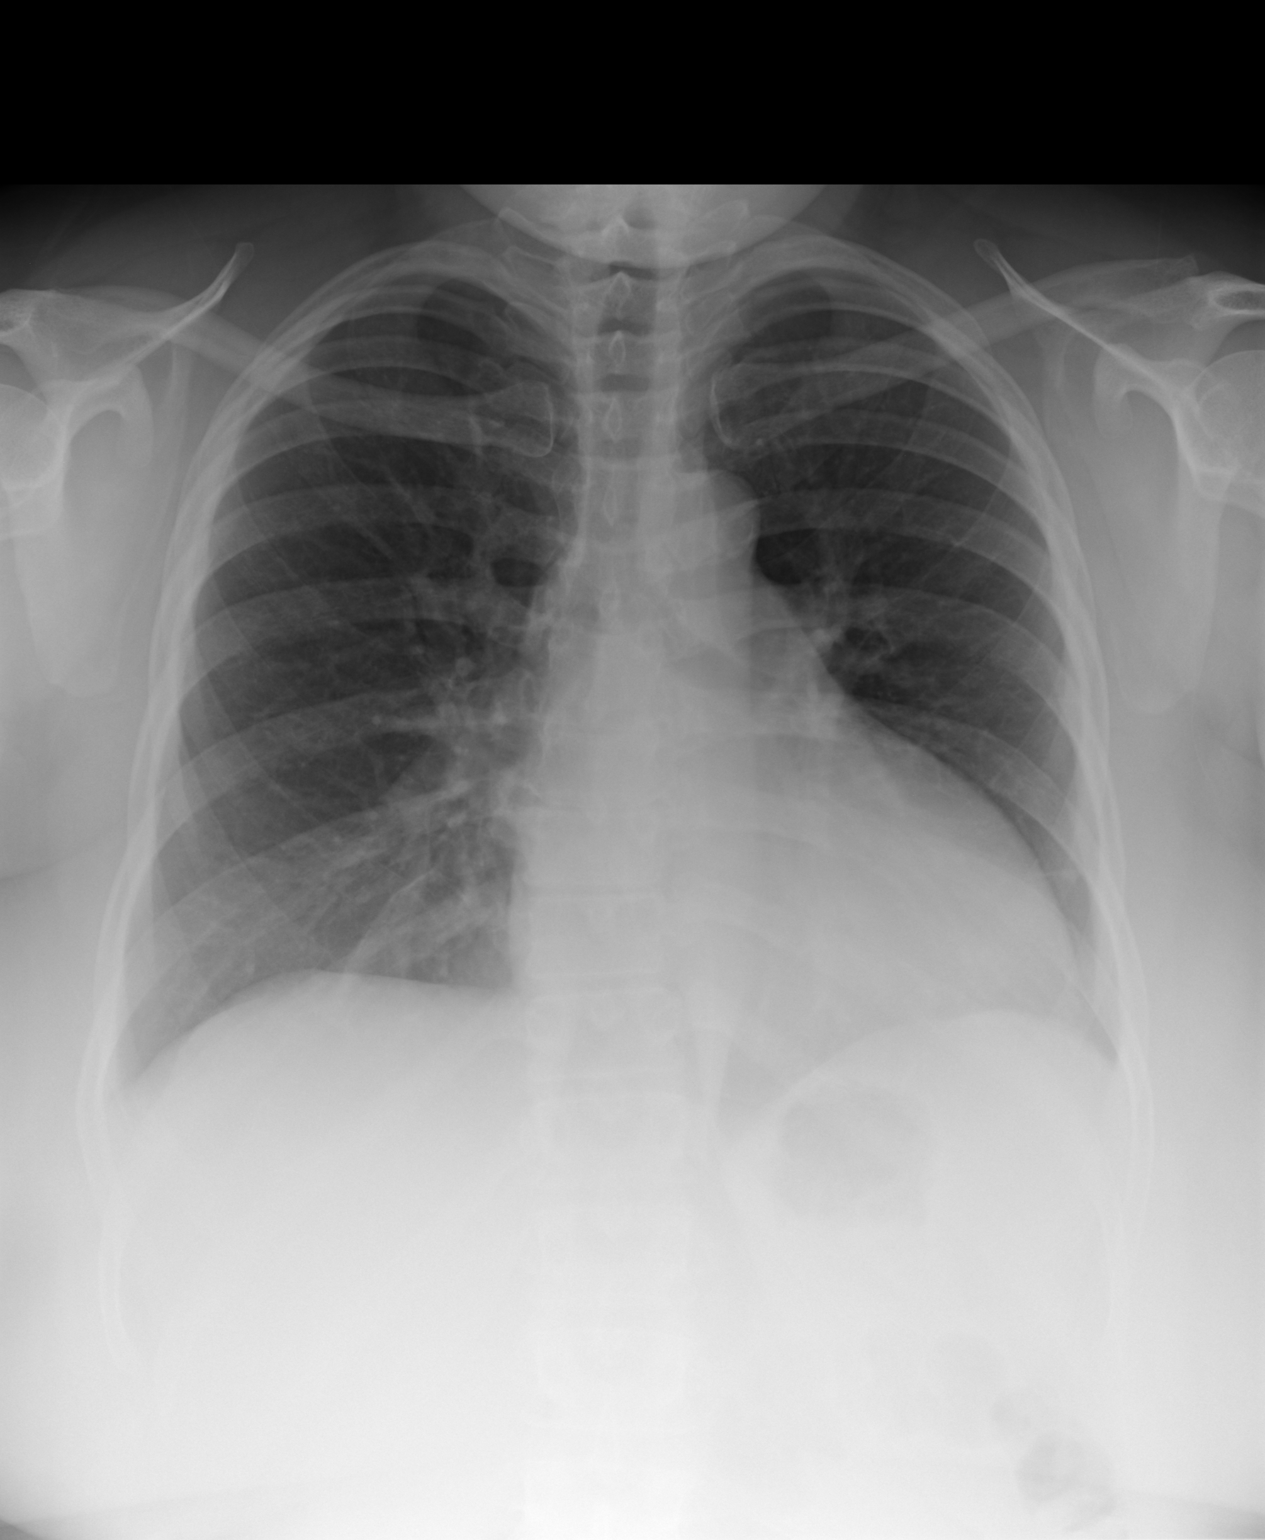
[im 2/2]
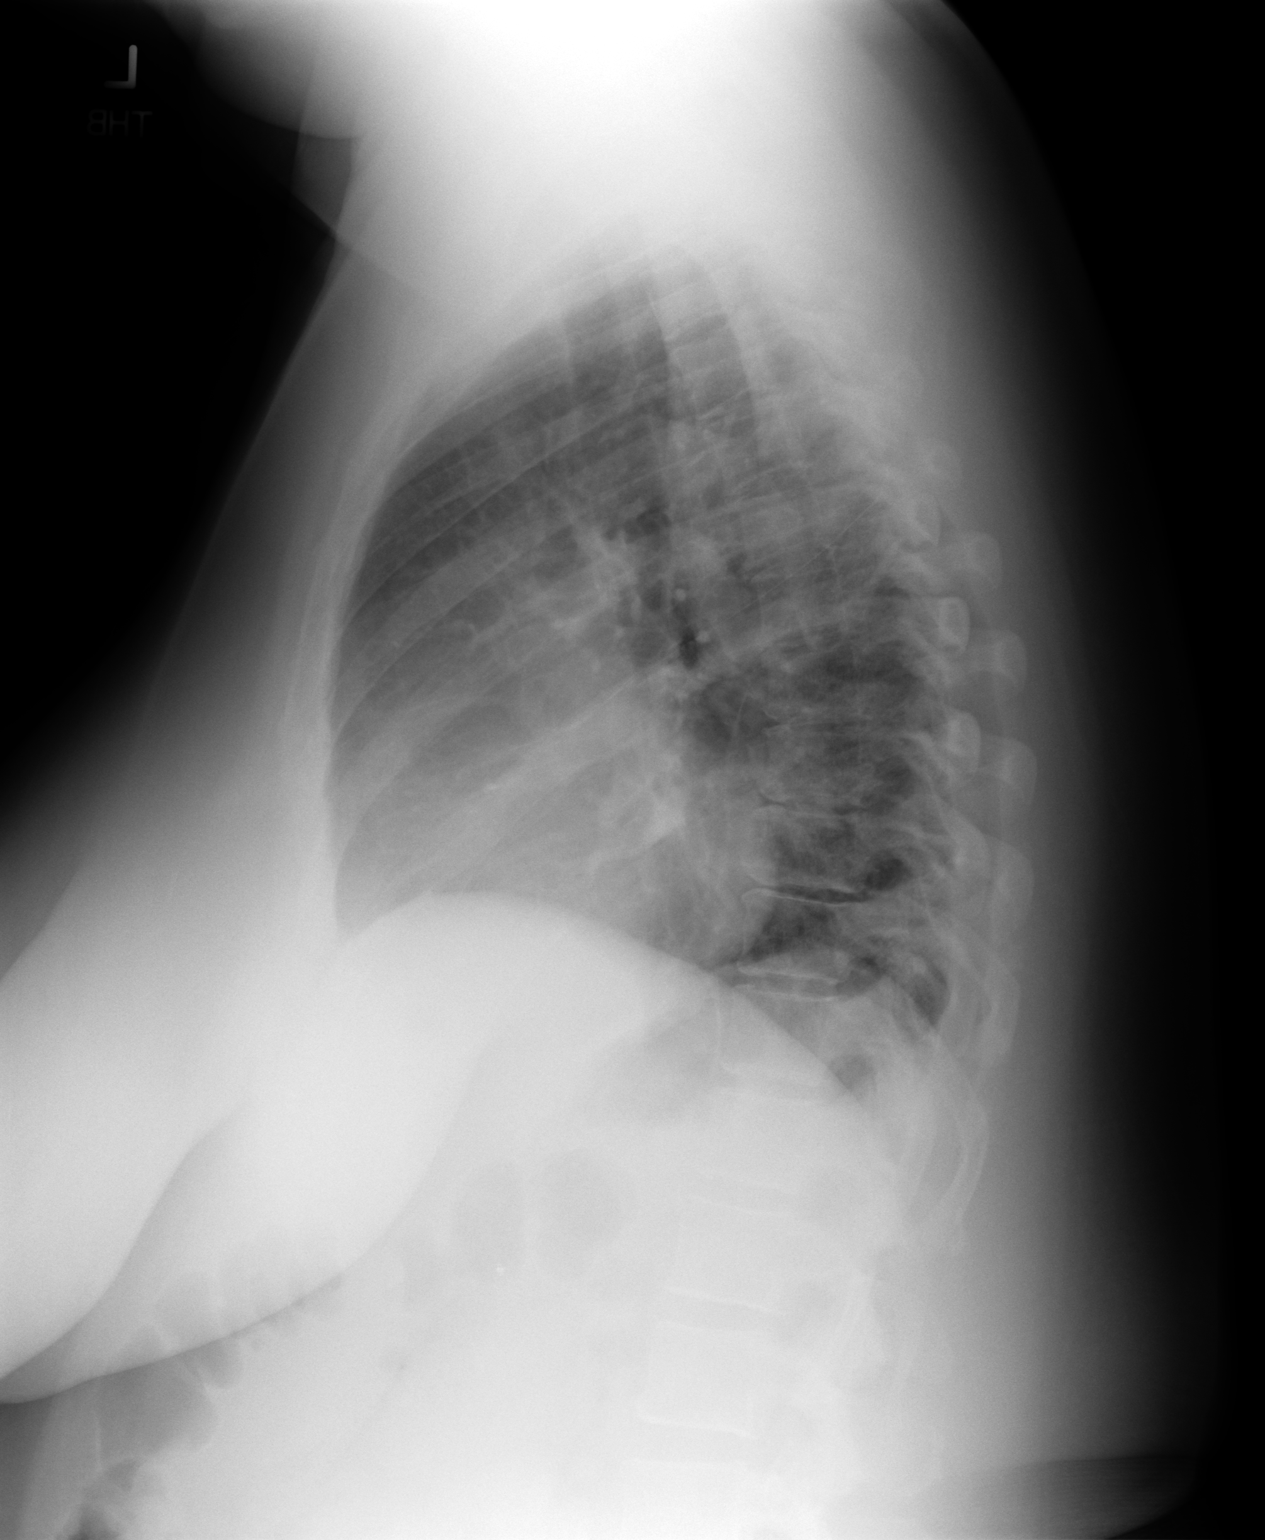

[2 of 2 positions shown; findings below may reference images not displayed]

PROCEDURE:     DXR - DXR CHEST PA (OR AP) AND LATERAL  - [DATE] [DATE]

RESULT:     Comparison is made to the prior exam of [DATE]. The lung
fields are clear. No pneumonia, pneumothorax or pleural effusion is seen.
The heart appears mildly enlarged. No pulmonary edema or pleural effusion is
noted. The osseous structures show no acute changes.
IMPRESSION: 1. The lung fields are clear.
2. Mild cardiomegaly.

## 2008-09-19 ENCOUNTER — Emergency Department: Payer: Self-pay | Admitting: Emergency Medicine

## 2008-09-19 IMAGING — CT CT HEAD WITHOUT CONTRAST
2 series · 16 of 30 positions shown, 20 images · non-contrast
Comparison: none

REASON FOR EXAM: return visit for severe headache "thunderstorm in my
head" now on R - eval for b
COMMENTS:

[Series 2: without · axial · non-contrast · 0.42mm/px · z∈[+386,+500]mm · 13 of 27 slices shown, 17 images]
[im 2/27  brain]
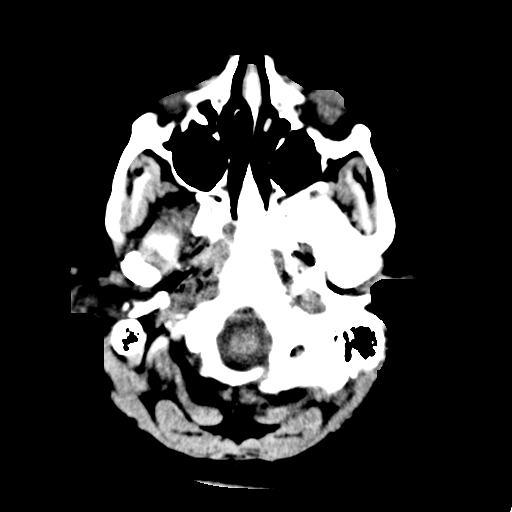
[im 2/27  bone]
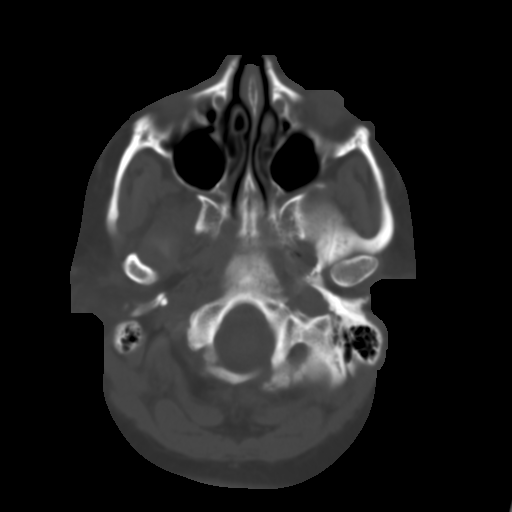
[im 4/27  brain]
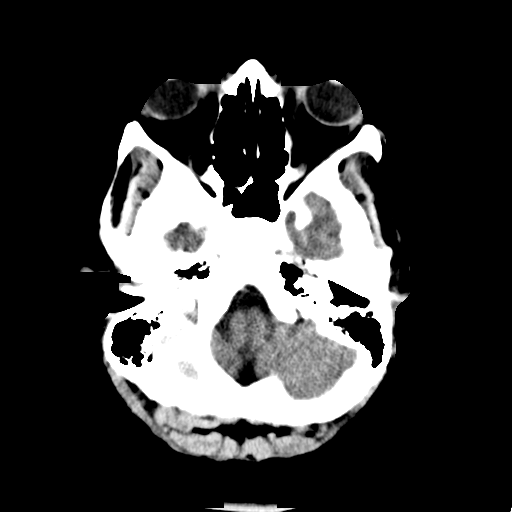
[im 6/27  brain]
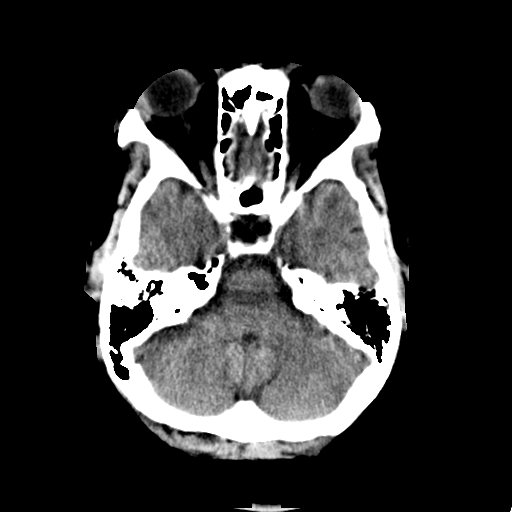
[im 8/27  brain]
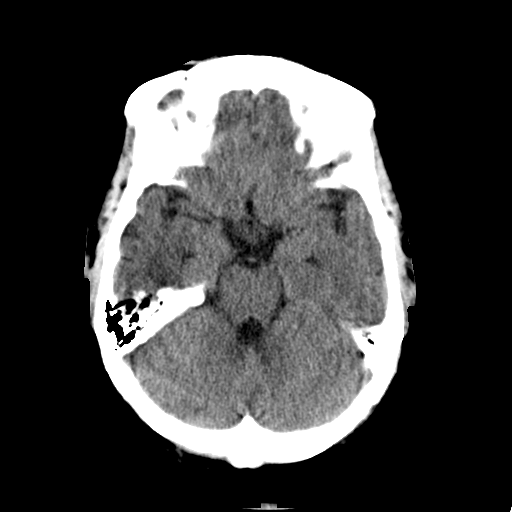
[im 10/27  brain]
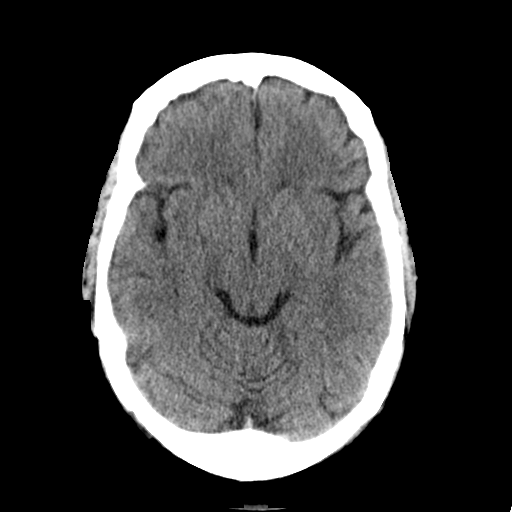
[im 10/27  bone]
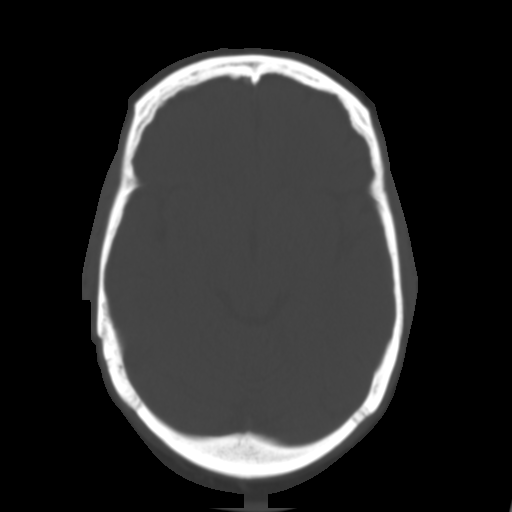
[im 12/27  brain]
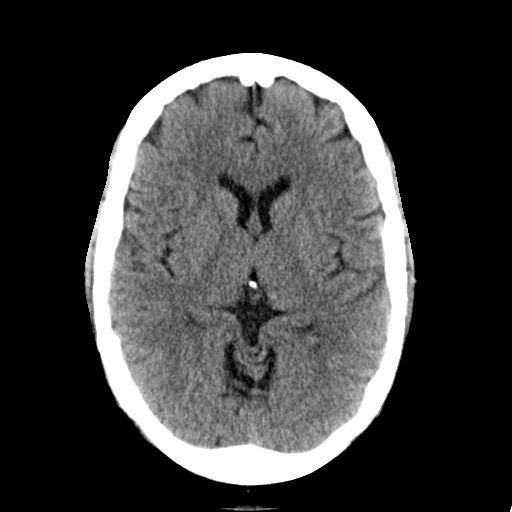
[im 14/27  brain]
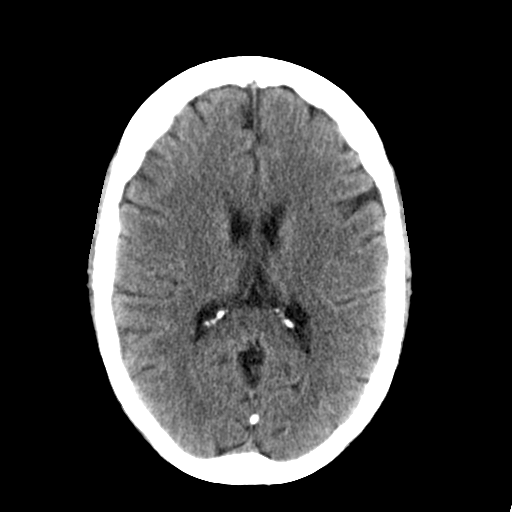
[im 15/27  brain]
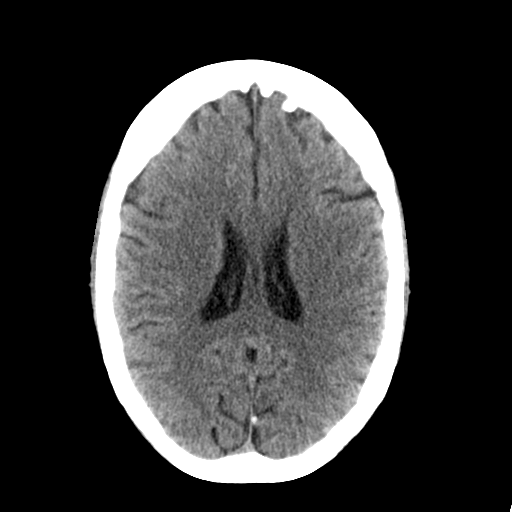
[im 17/27  brain]
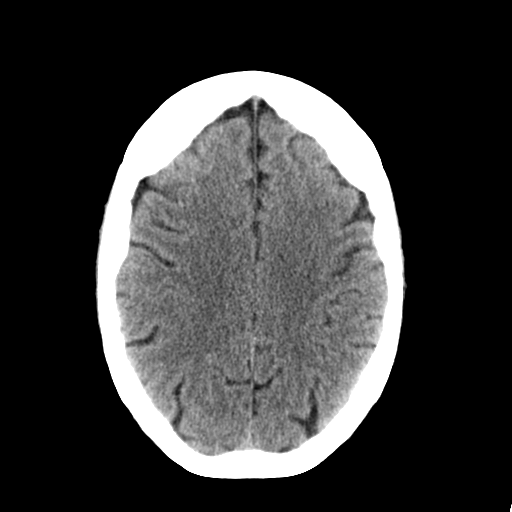
[im 17/27  bone]
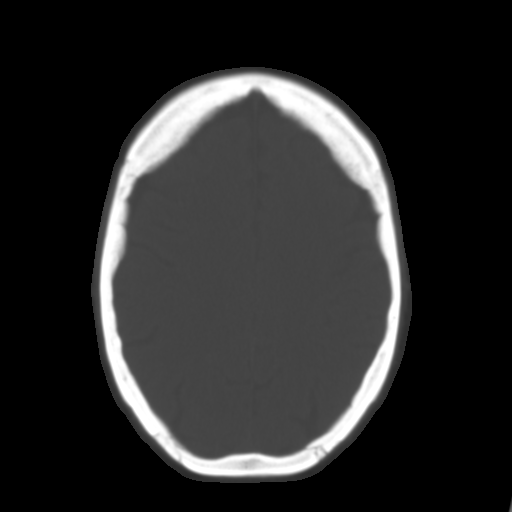
[im 19/27  brain]
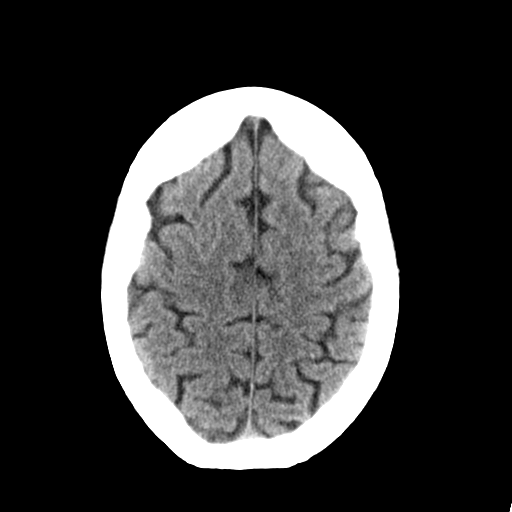
[im 21/27  brain]
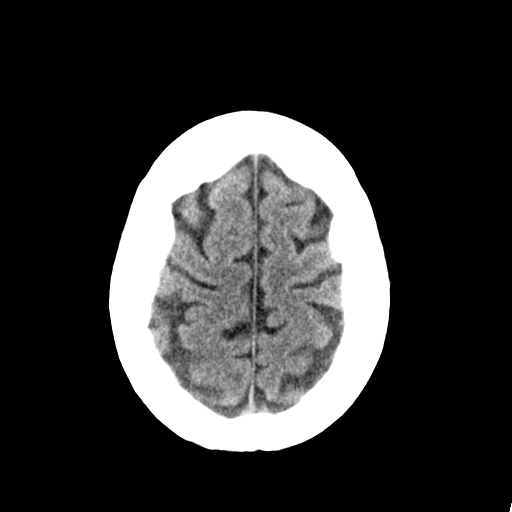
[im 23/27  brain]
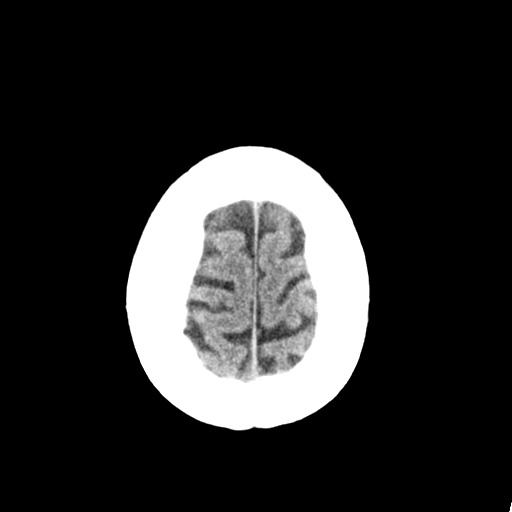
[im 25/27  brain]
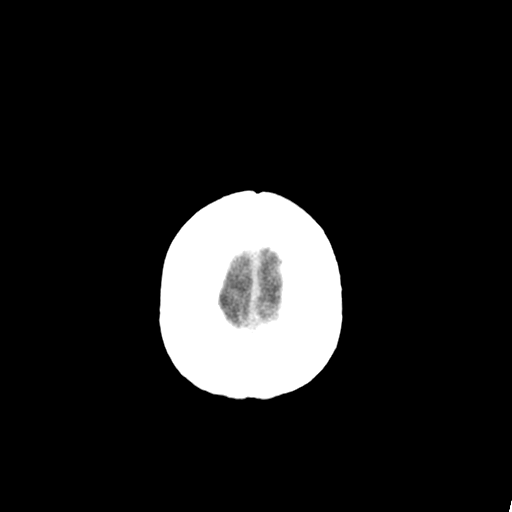
[im 25/27  bone]
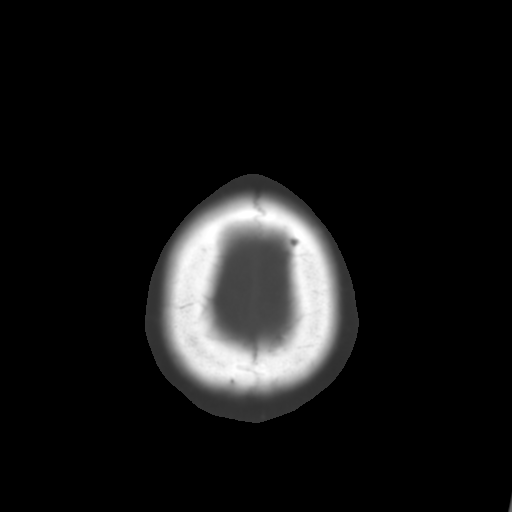

[Series 3: bone · axial · 0.42mm/px · z∈[+386,+426]mm · 3 of 27 slices shown]
[im 2/27  bone]
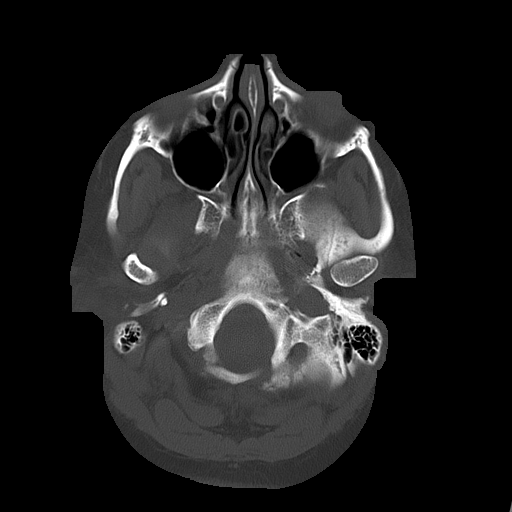
[im 6/27  bone]
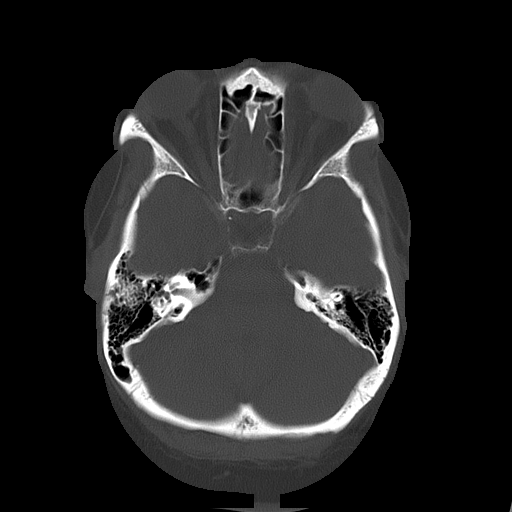
[im 10/27  bone]
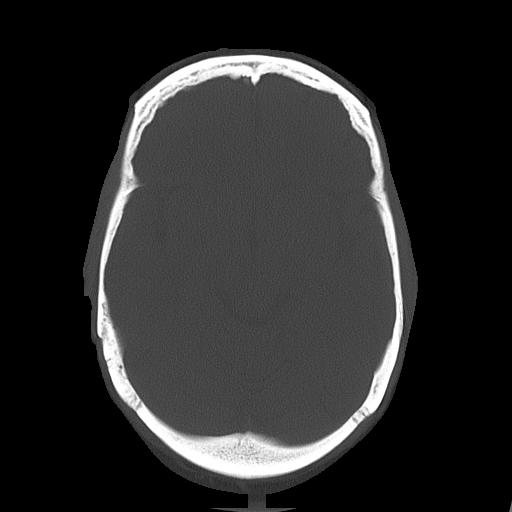

[16 of 30 positions shown; findings below may reference images not displayed]

PROCEDURE:     CT  - CT HEAD WITHOUT CONTRAST  - [DATE]  [DATE]

RESULT:     Axial noncontrast CT scanning was performed through the brain at
5 mm intervals and slice thicknesses.

The ventricles are normal in size and position. There is no evidence of an
intracranial hemorrhage nor of intracranial mass effect. There are no
findings to suggest an acute evolving ischemic infarction. The cerebellum
and brainstem are normal in density. At bone window settings the observed
portions of the paranasal sinuses are clear. The mastoid air cells appear
well pneumatized.
IMPRESSION: I see no acute intracranial abnormality. Followup imaging
may be useful if the patient's symptoms persist and remain unexplained.

## 2008-12-26 ENCOUNTER — Emergency Department: Payer: Self-pay | Admitting: Emergency Medicine

## 2008-12-26 IMAGING — CR DG CHEST 2V
1 series · 2 of 2 positions shown · non-contrast
Comparison: none

REASON FOR EXAM: cough , cp , sob
COMMENTS:

[Series 1: view not recorded · 0.17mm/px · 2 of 2 slices shown]
[im 1/2]
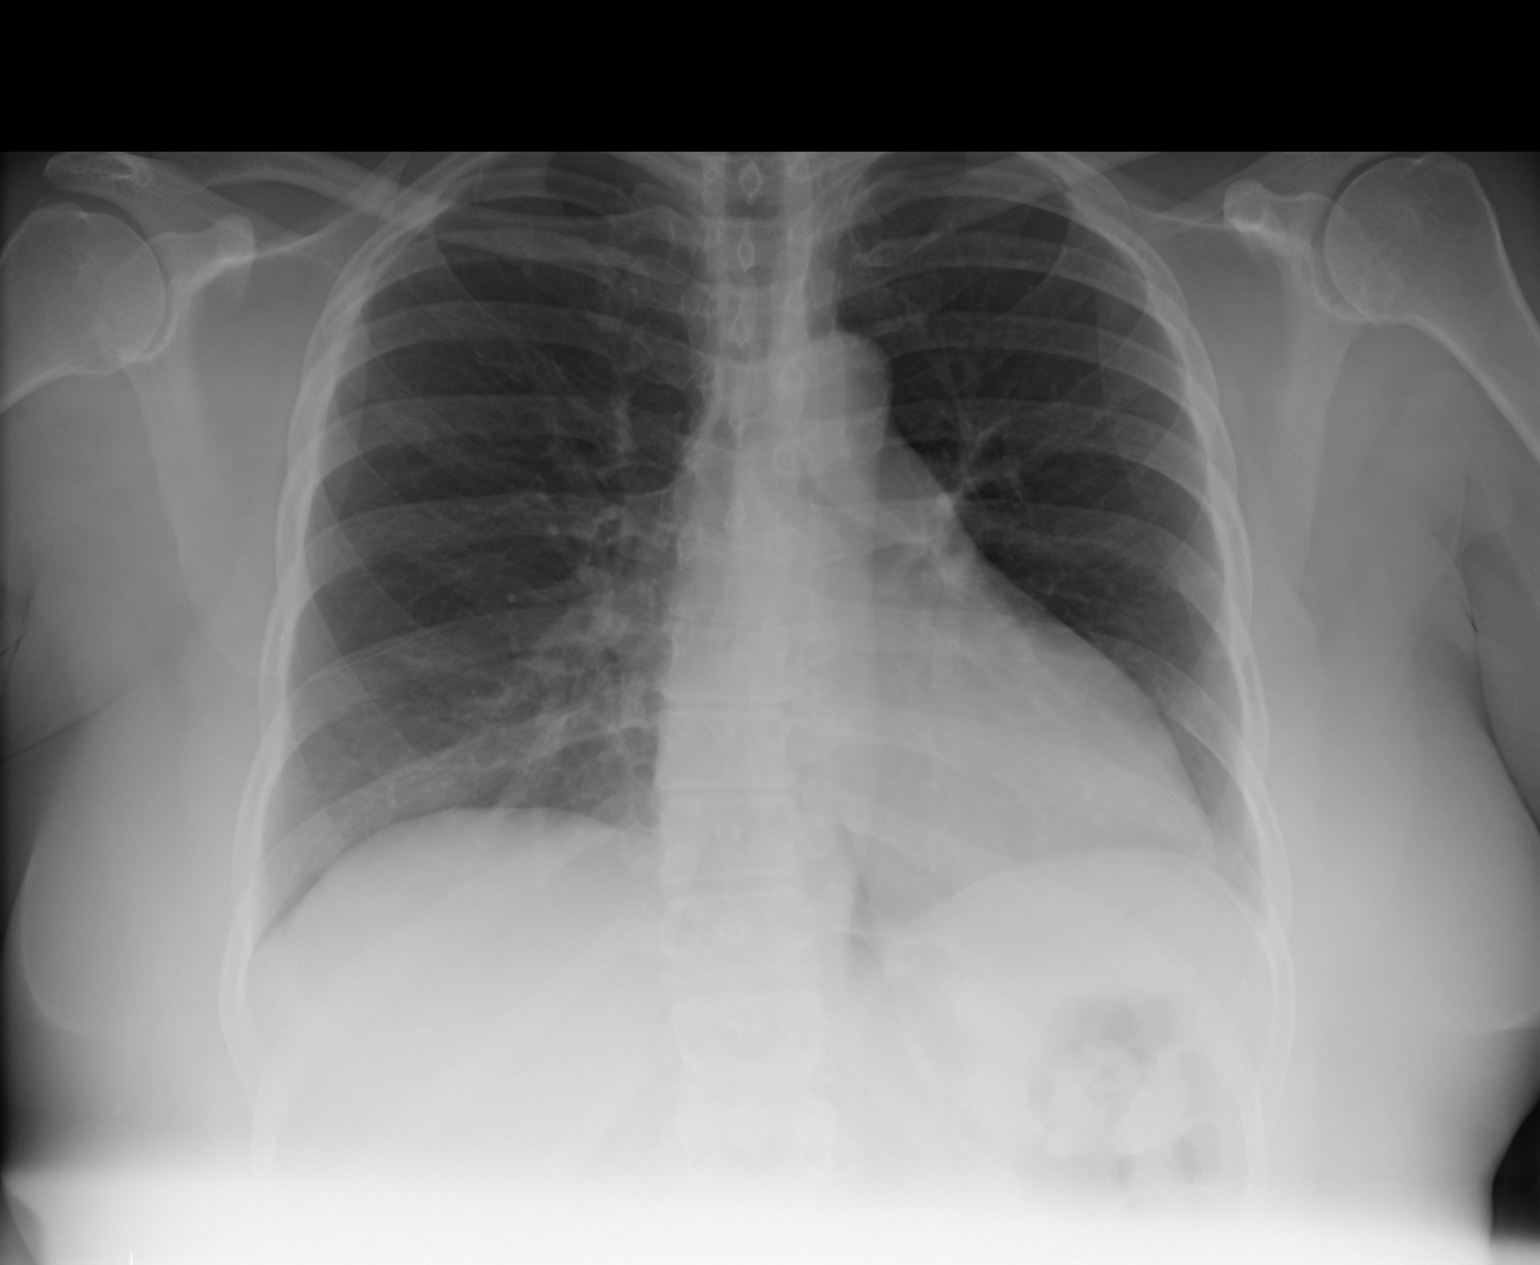
[im 2/2]
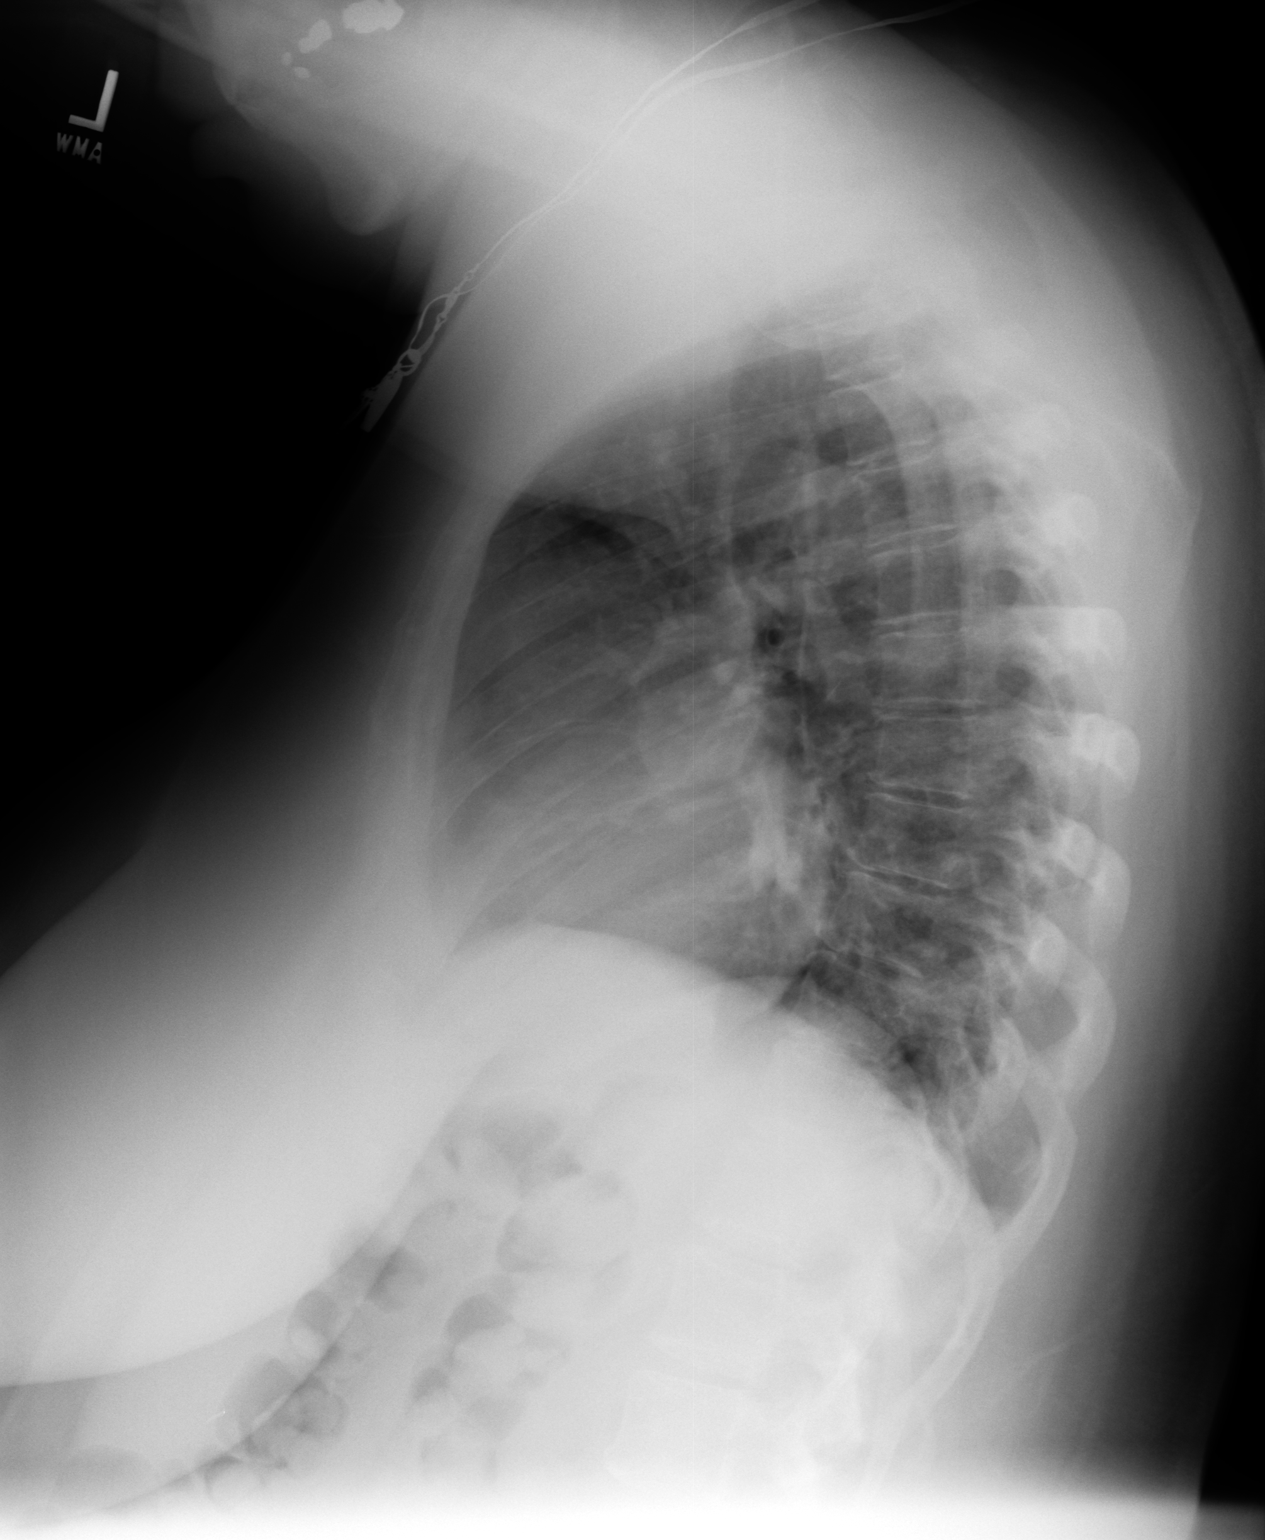

[2 of 2 positions shown; findings below may reference images not displayed]

PROCEDURE:     DXR - DXR CHEST PA (OR AP) AND LATERAL  - [DATE]  [DATE]

RESULT:     Comparison is made to the examination of [DATE].

The lungs are clear. The heart and pulmonary vessels are normal. The bony
and mediastinal structures are unremarkable. There is no effusion. There is
no pneumothorax or evidence of congestive failure.
IMPRESSION: No acute cardiopulmonary disease. Stable appearance.

## 2008-12-26 IMAGING — US ABDOMEN ULTRASOUND
1 series · 17 of 25 positions shown · non-contrast
Comparison: none

REASON FOR EXAM: pain ruq history of pancreatitis
COMMENTS:

[Series 1: abdomen ultrasound · 17 of 52 slices shown]
[im 1/52]
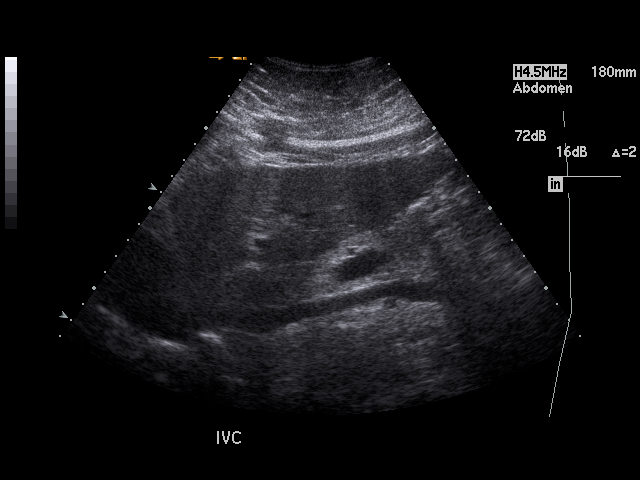
[im 5/52]
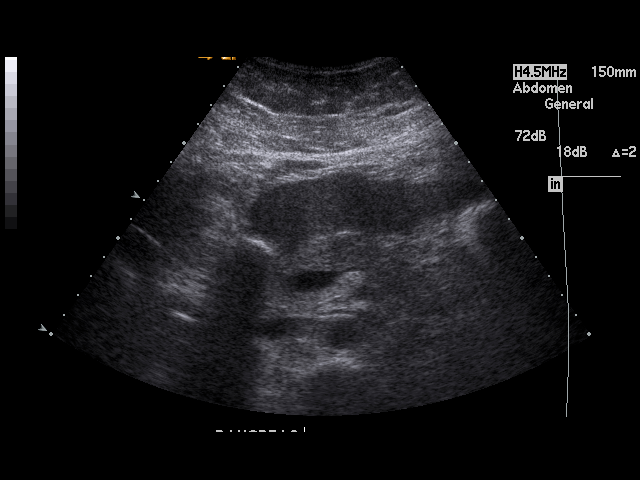
[im 7/52]
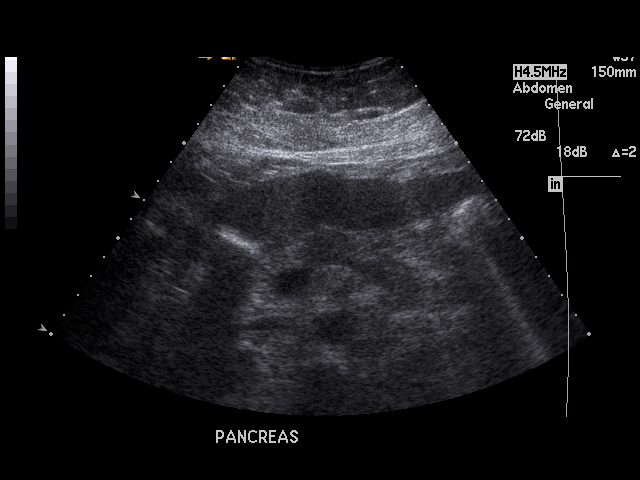
[im 11/52]
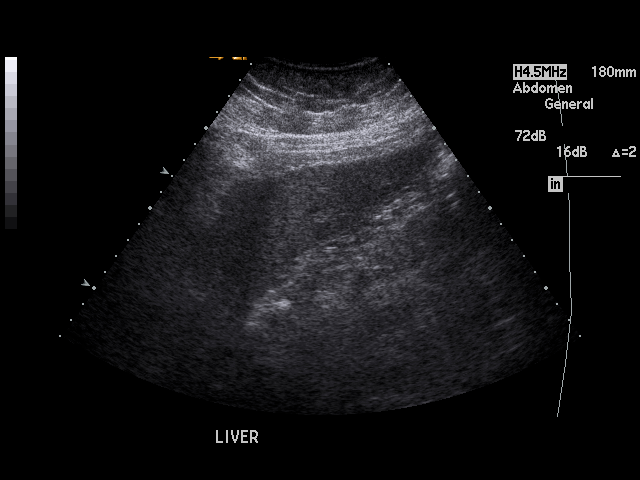
[im 13/52]
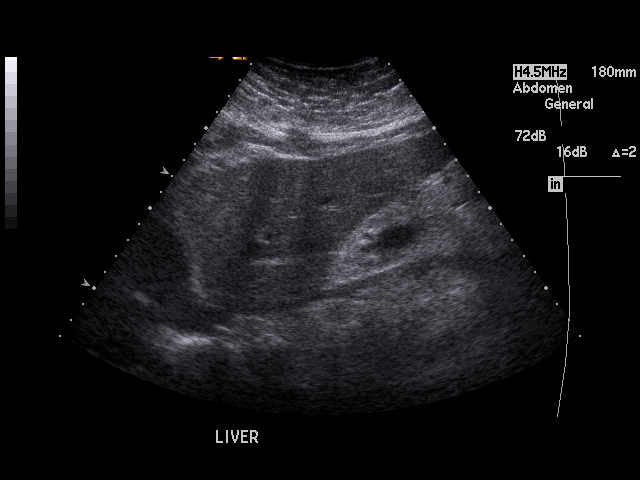
[im 18/52]
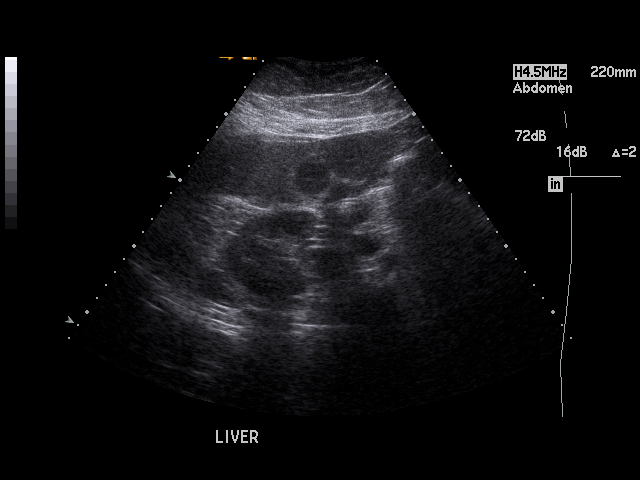
[im 20/52]
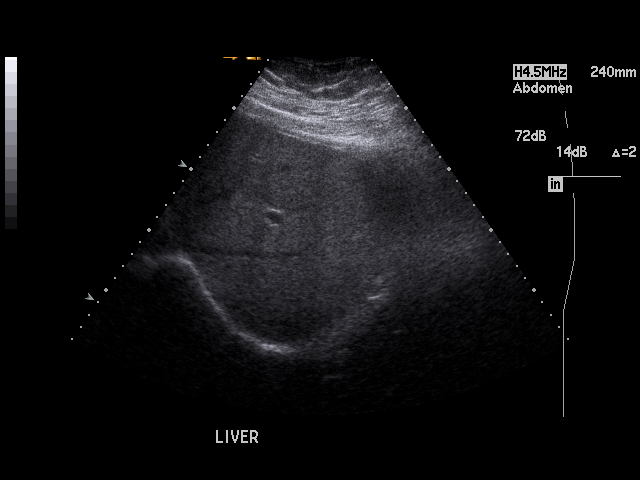
[im 24/52]
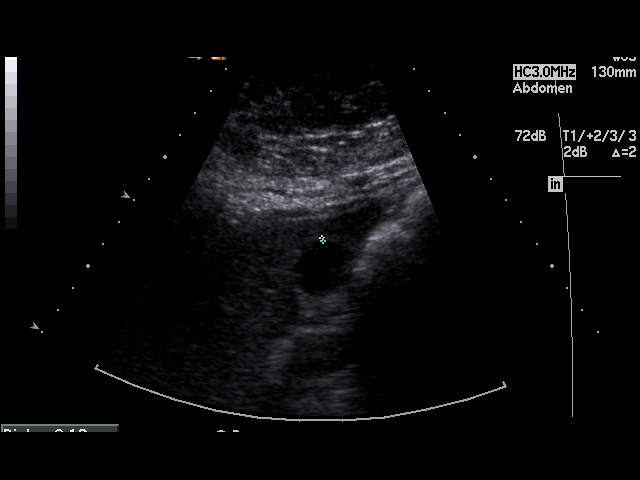
[im 26/52]
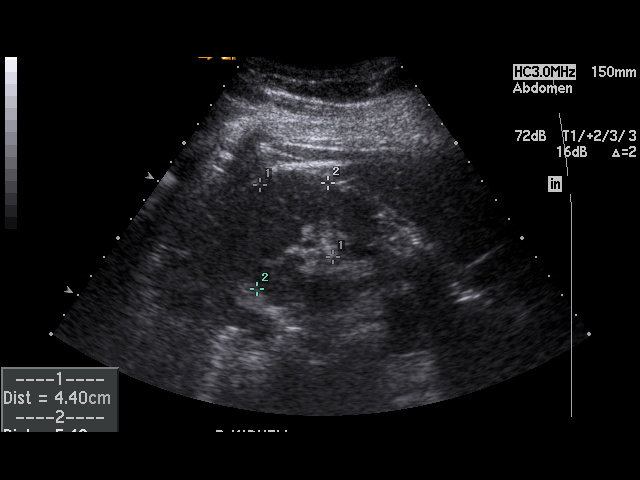
[im 28/52]
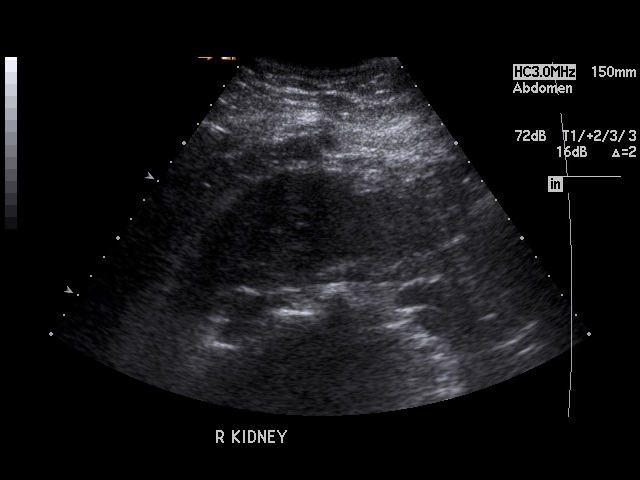
[im 32/52]
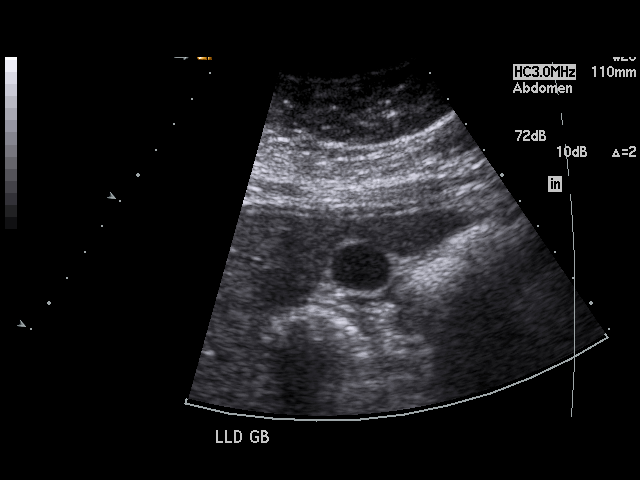
[im 35/52]
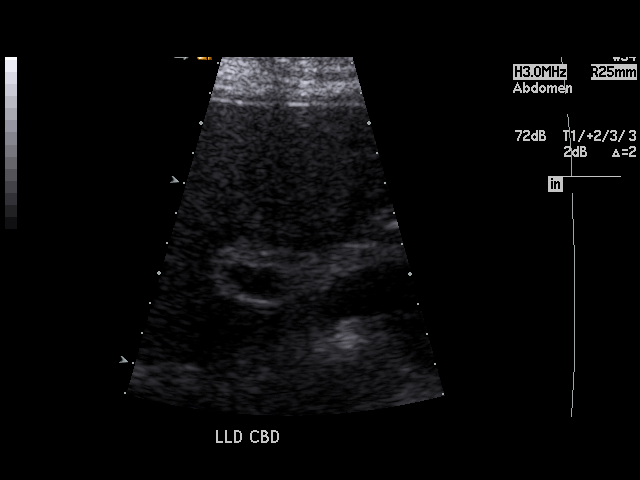
[im 39/52]
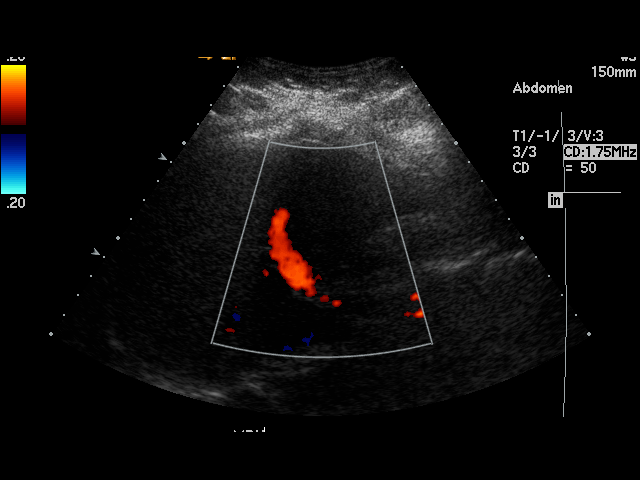
[im 41/52]
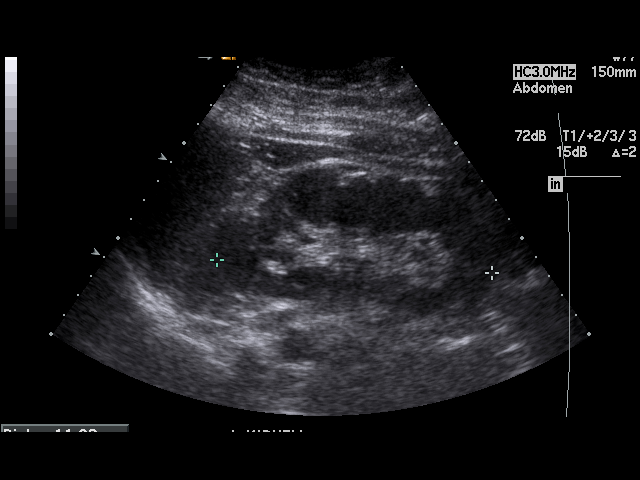
[im 45/52]
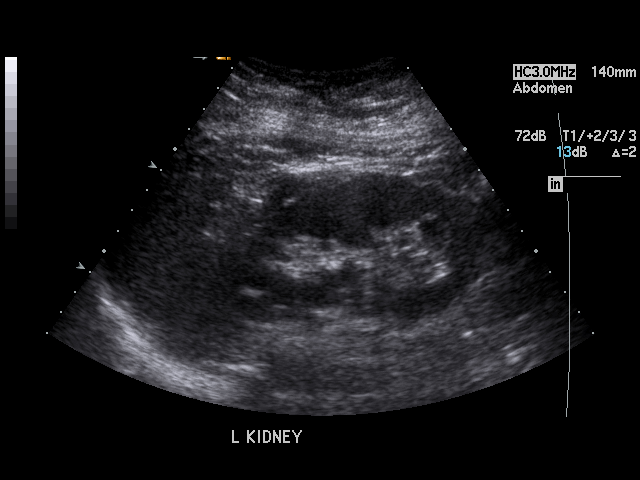
[im 47/52]
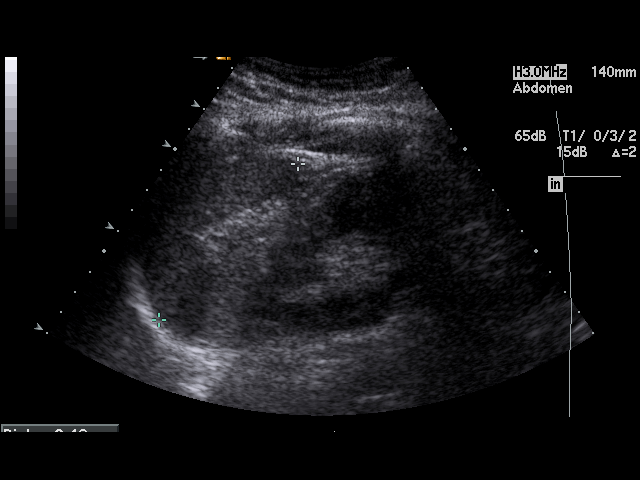
[im 52/52]
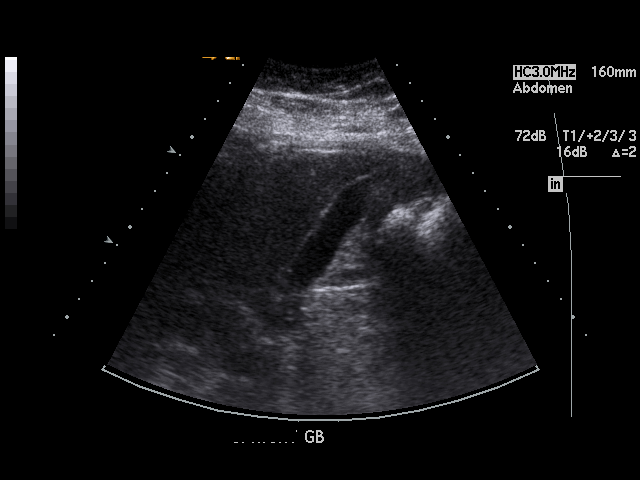

[17 of 25 positions shown; findings below may reference images not displayed]

PROCEDURE:     US  - US ABDOMEN GENERAL SURVEY  - [DATE]  [DATE]

RESULT:     Abdominal sonogram demonstrates the inferior vena cava,
abdominal aorta, pancreas, liver, gallbladder, common bile duct, spleen and
kidneys are unremarkable. Common bile duct diameter 4.5 mm. The gallbladder
wall thickness is 1.2 mm. The portal venous flow is unremarkable.
IMPRESSION: 1. Normal-appearing abdominal sonogram.

## 2009-04-13 ENCOUNTER — Emergency Department: Payer: Self-pay

## 2009-05-25 ENCOUNTER — Emergency Department: Payer: Self-pay | Admitting: Emergency Medicine

## 2009-05-25 IMAGING — CR DG CHEST 1V PORT
1 series · 1 of 1 positions shown · non-contrast
Comparison: none

REASON FOR EXAM: cp
COMMENTS:

[view not recorded]
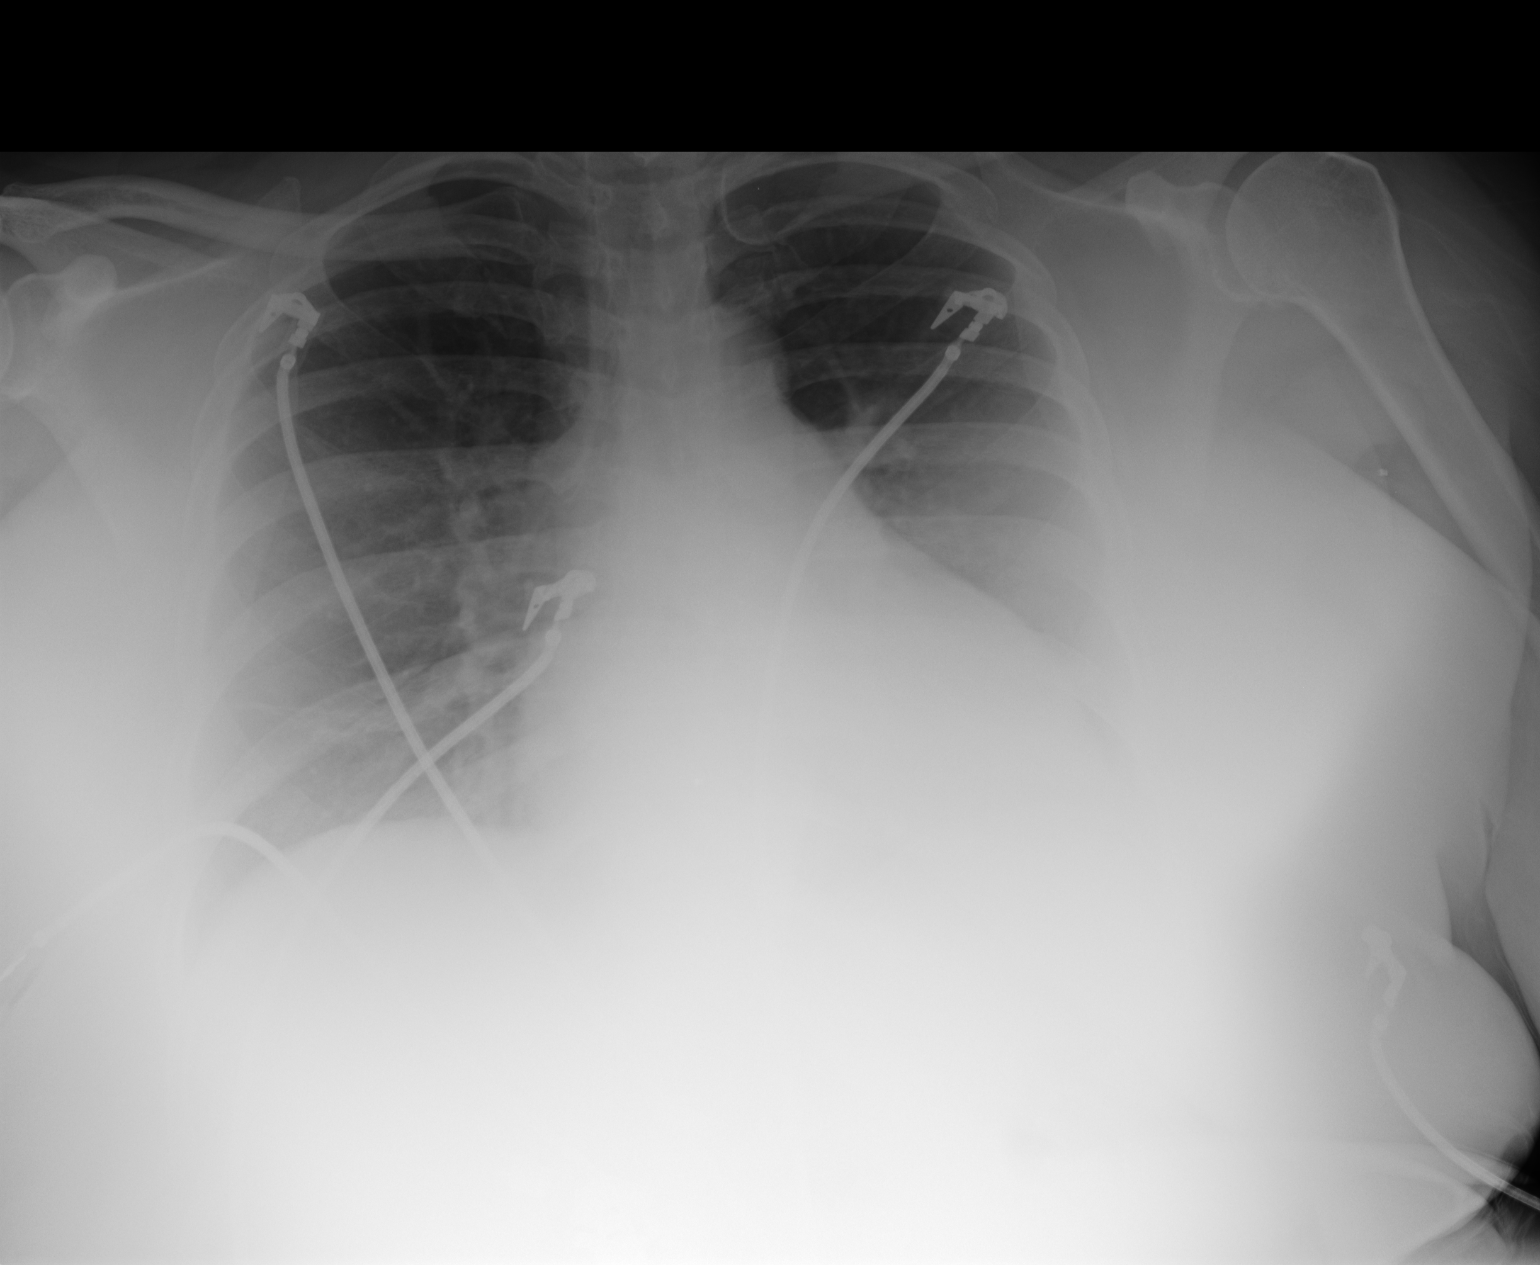

[1 of 1 positions shown; findings below may reference images not displayed]

PROCEDURE:     DXR - DXR PORTABLE CHEST SINGLE VIEW  - [DATE]  [DATE]

RESULT:     Comparison is made to a prior exam of [DATE].

The current exam shows a hazy increase in density over the left lower lung
field. This is thought to likely be secondary to overlying soft tissue as
opposed to an infiltrate. Nonetheless, follow-up examination including PA
and lateral views is recommended when clinically feasible. No acute changes
of the heart or pulmonary vasculature are seen. Monitoring electrodes are
present.
IMPRESSION: There is a hazy increase in density over the left lung base.
As noted above, this may be secondary to overlying soft tissue. Follow-up PA
and lateral views of the chest are recommended when clinically feasible.

## 2009-08-23 ENCOUNTER — Emergency Department: Payer: Self-pay | Admitting: Emergency Medicine

## 2009-09-02 DIAGNOSIS — F329 Major depressive disorder, single episode, unspecified: Secondary | ICD-10-CM | POA: Insufficient documentation

## 2009-09-02 DIAGNOSIS — F32A Depression, unspecified: Secondary | ICD-10-CM | POA: Insufficient documentation

## 2009-09-19 ENCOUNTER — Emergency Department: Payer: Self-pay | Admitting: Emergency Medicine

## 2009-10-23 ENCOUNTER — Ambulatory Visit: Payer: Self-pay | Admitting: Internal Medicine

## 2009-10-23 IMAGING — CR DG CHEST 2V
1 series · 2 of 2 positions shown · non-contrast
Comparison: none

REASON FOR EXAM: SOB
COMMENTS:

[Series 1: view not recorded · 0.17mm/px · 2 of 2 slices shown]
[im 1/2]
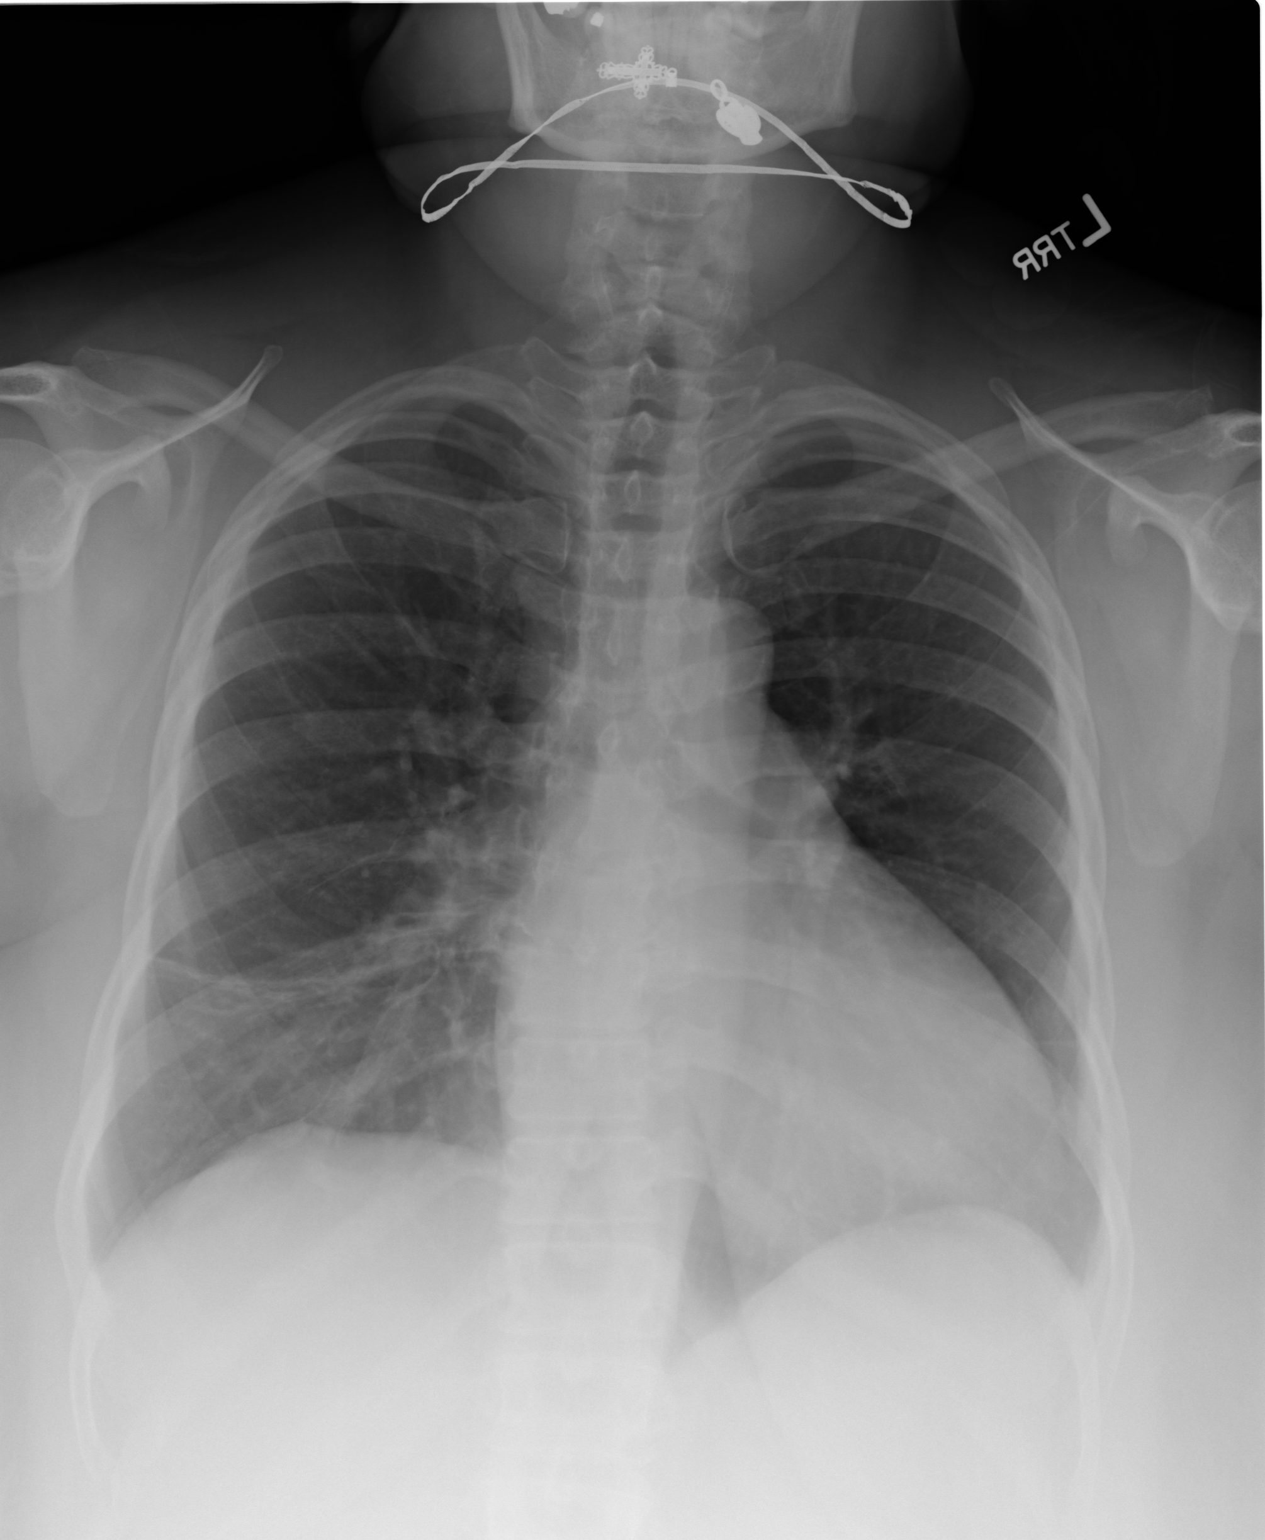
[im 2/2]
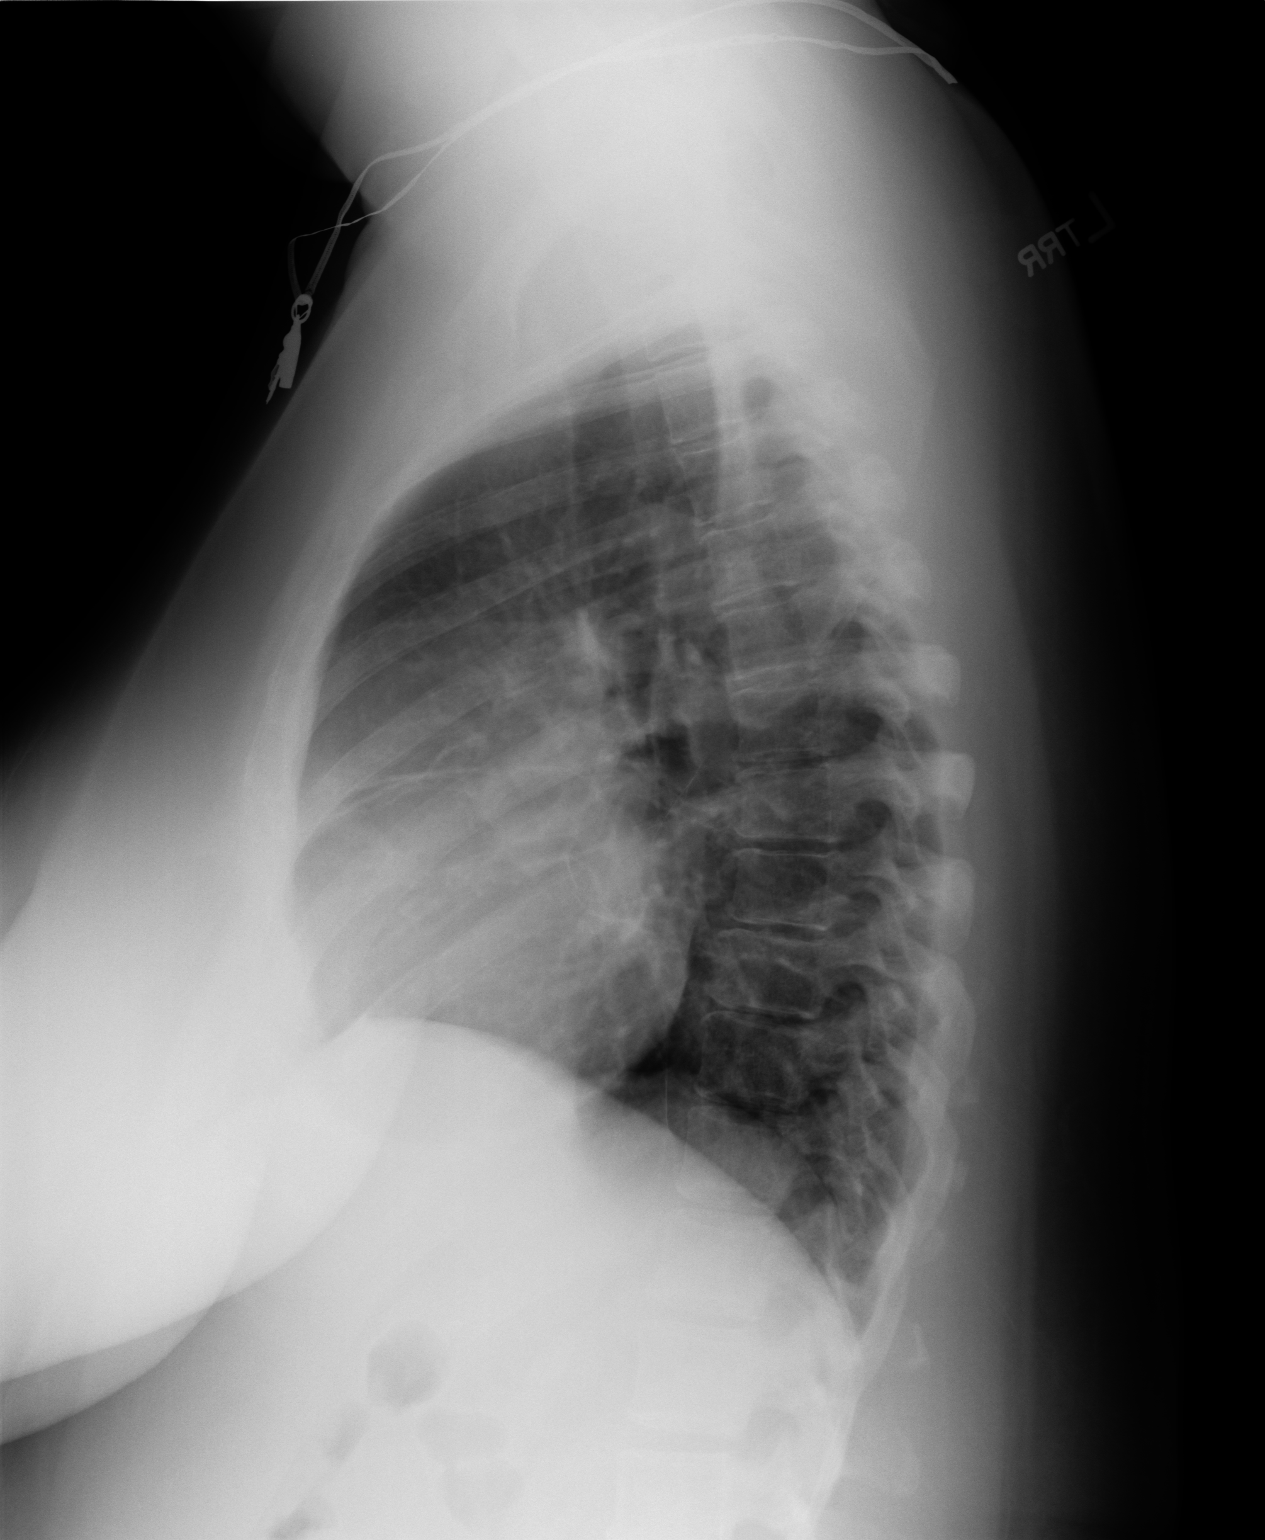

[2 of 2 positions shown; findings below may reference images not displayed]

PROCEDURE:     DXR - DXR CHEST PA (OR AP) AND LATERAL  - [DATE]  [DATE]

RESULT:     Comparison is made to the prior exam of [DATE].

The lung fields are clear of infiltrate. No pneumonia or pneumothorax is
seen. There is thickening of the minor fissure suspicious for a trace of
interlobar fluid. The etiology for this is not evident. There is no frank
pulmonary edema or definite interstitial edema. The heart is mildly enlarged
but does not appear appreciably changed in size as compared to prior
examinations. The mediastinal and osseous structures show no acute changes.
IMPRESSION: 1.  The lung fields are clear of infiltrate.
2.  There is slight thickening of the minor fissure compatible with a trace
of interlobar fluid. The etiology for this is not evident.
3.  The cardiac silhouette is mildly prominent but stable in size as
compared to prior examinations.
4.  No pulmonary edema is seen.

## 2010-02-02 ENCOUNTER — Emergency Department: Payer: Self-pay | Admitting: Emergency Medicine

## 2010-02-02 IMAGING — CT CT ABD-PELV W/O CM
1 of 2 series · 15 of 32 positions shown, 19 images · non-contrast
Comparison: none

REASON FOR EXAM: (1) left flank pain; (2) left pelvic pain
COMMENTS:   LMP: One week ago

[Series 5: stone · axial · 0.77mm/px · z∈[-38,+374]mm · 15 of 155 slices shown, 19 images]
[im 12/155  soft-tissue]
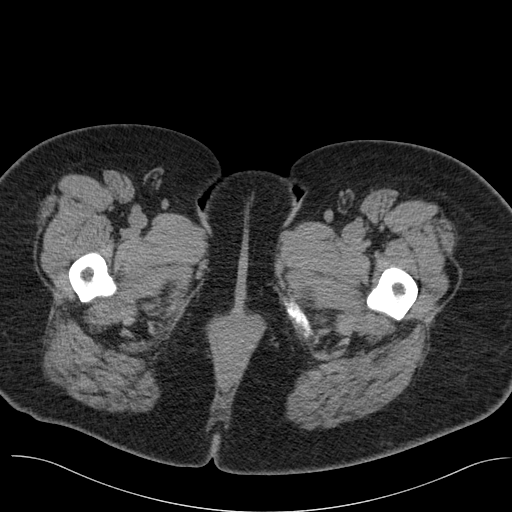
[im 12/155  bone]
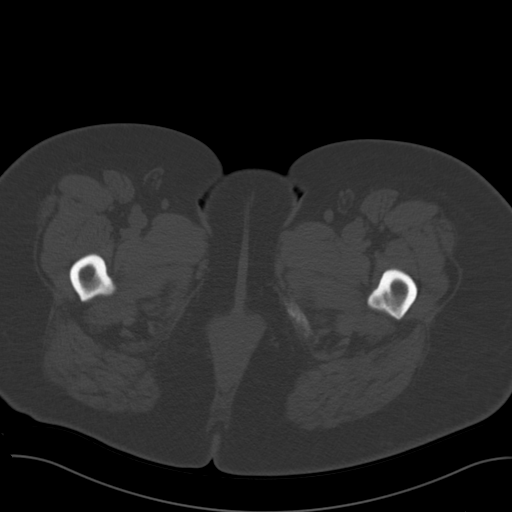
[im 23/155  soft-tissue]
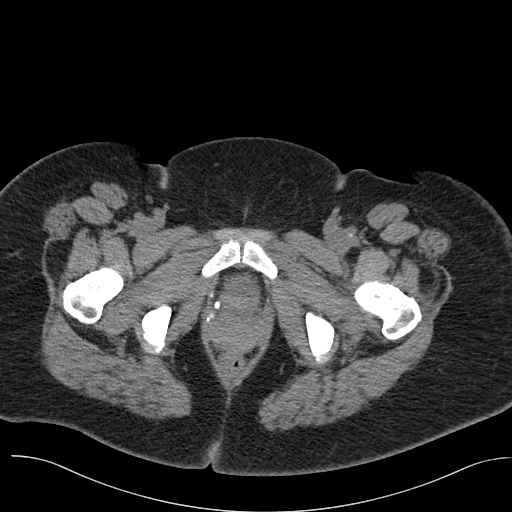
[im 35/155  soft-tissue]
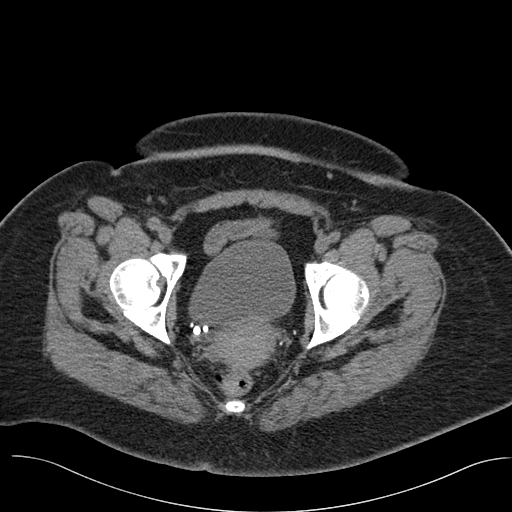
[im 46/155  soft-tissue]
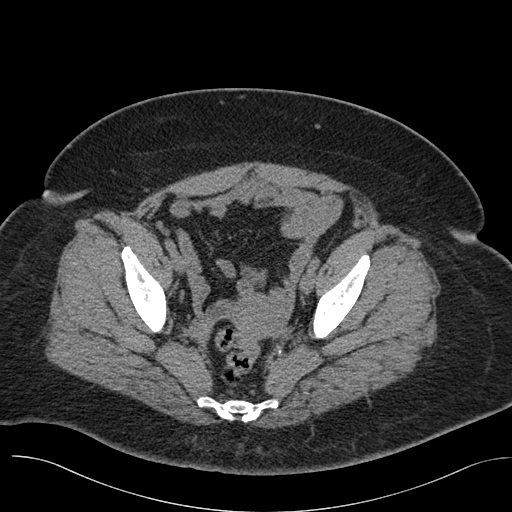
[im 58/155  soft-tissue]
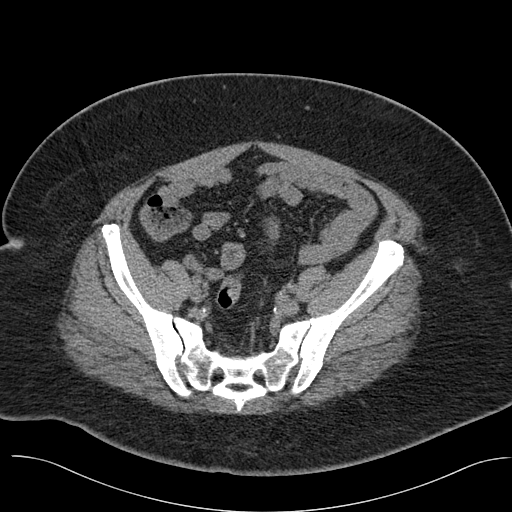
[im 69/155  soft-tissue]
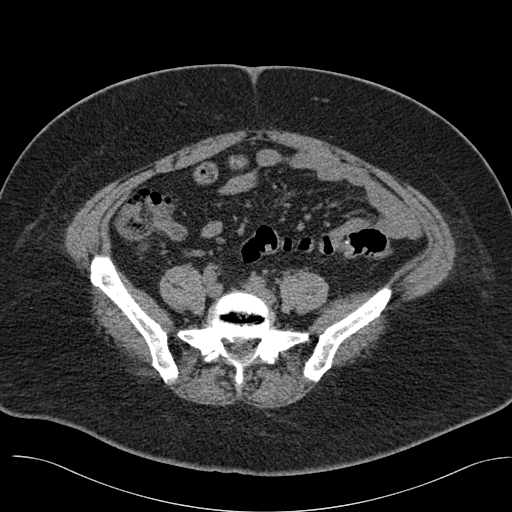
[im 80/155  soft-tissue]
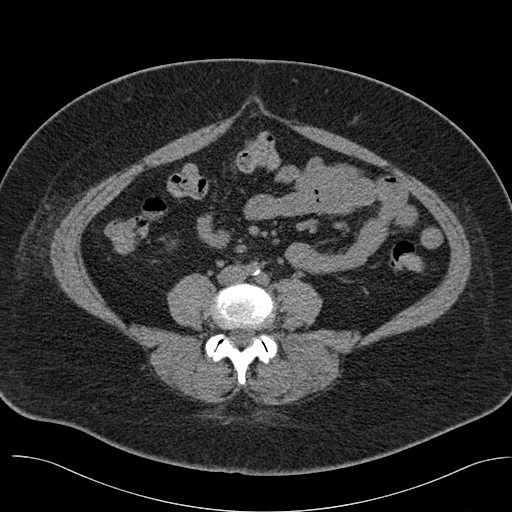
[im 92/155  soft-tissue]
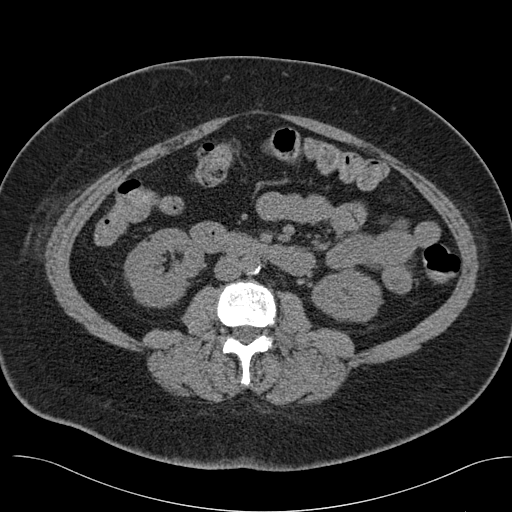
[im 103/155  soft-tissue]
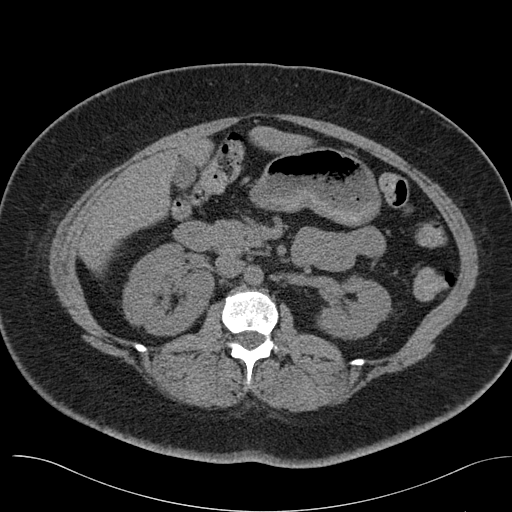
[im 103/155  bone]
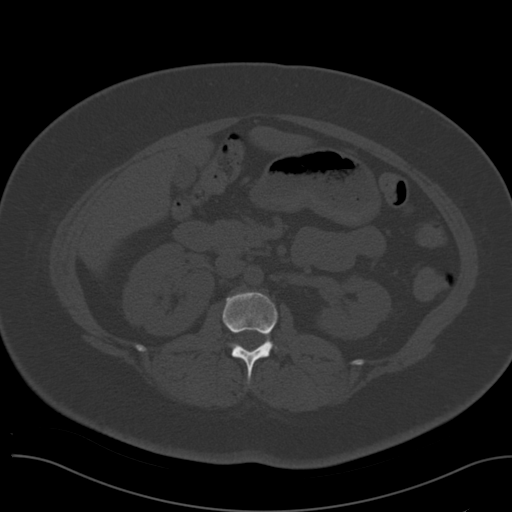
[im 115/155  soft-tissue]
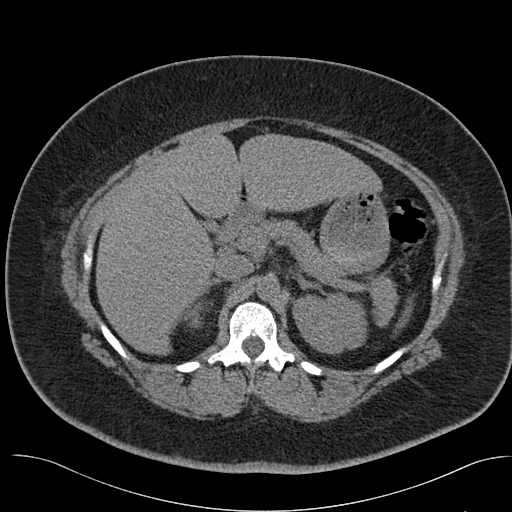
[im 126/155  soft-tissue]
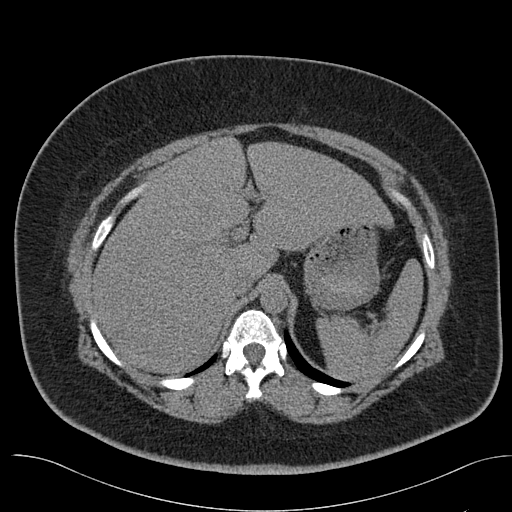
[im 132/155  lung]
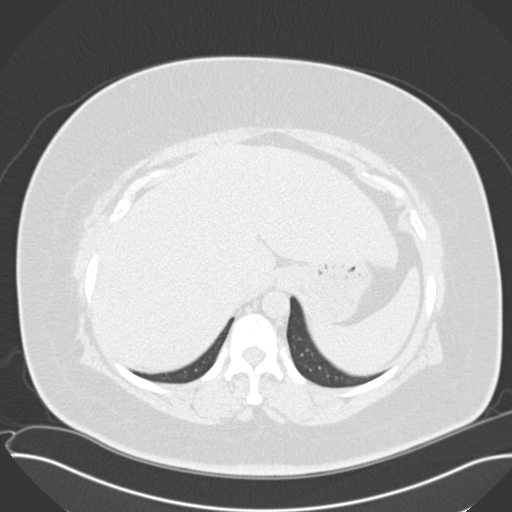
[im 137/155  soft-tissue]
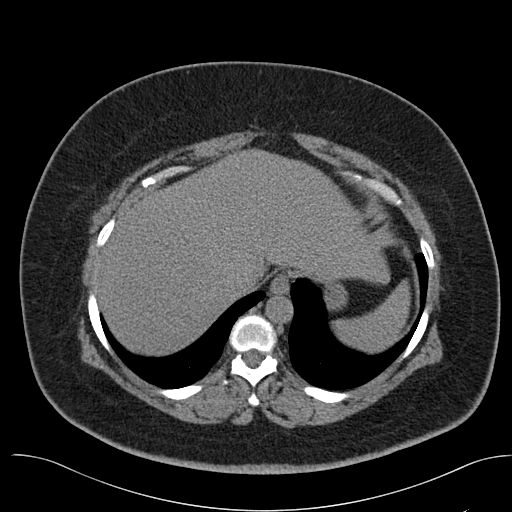
[im 137/155  lung]
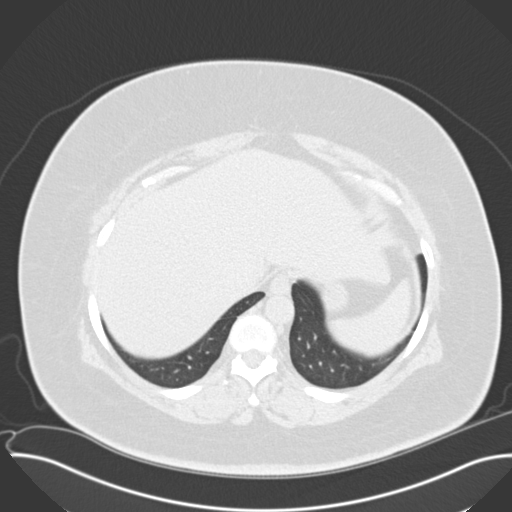
[im 143/155  lung]
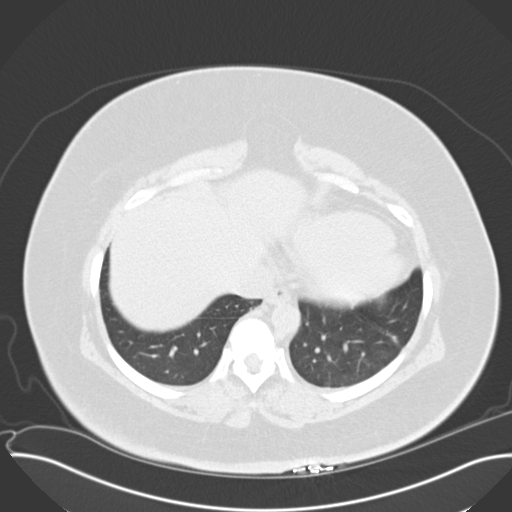
[im 149/155  soft-tissue]
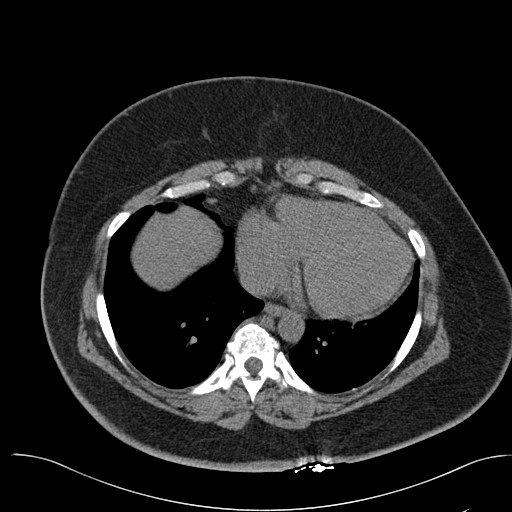
[im 149/155  lung]
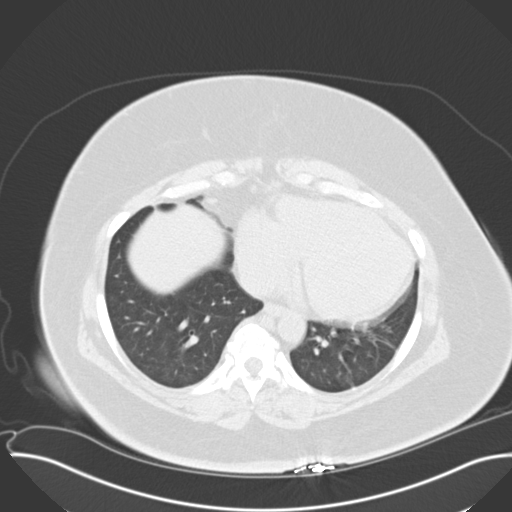

[15 of 32 positions shown; findings below may reference images not displayed]

PROCEDURE:     CT  - CT ABDOMEN AND PELVIS W[DATE]  [DATE]

RESULT:     Noncontrast emergent CT of the abdomen and pelvis is compared to
previous contrast enhanced images of [DATE].

A fat filled ventral hernia is present in the epigastric region on images 11
through 17 with a smaller hernia inferiorly on image 19 through 23 which is
also fat filled. A tiny fat filled umbilical hernia is present.

The aorta is normal in calcification without aneurysm or exsanguination.
Scattered atherosclerotic calcification is present. The appendix appears
normal. Numerous phleboliths are present in the pelvic region. The uterus is
present without enlargement or mass. No adnexal mass is appreciated. No
hydroureteronephrosis or nephrolithiasis is evident. The kidneys show some
mild lobulation which cannot be further assessed on this noncontrast exam.
The liver and spleen appear normal in size. The pancreas is unremarkable.
The gallbladder is nondistended without evidence of radiopaque stones. The
adrenal glands appear normal in size. No abnormal bowel distention is
evident. The urinary bladder is not significantly distended. No definite
stones are evident within the bladder. Images through the lung bases show
some dependent atelectasis bilaterally.
IMPRESSION: 1. Small fat filled hernias as described.
2. No urinary obstruction or definite nephrolithiasis.

## 2010-06-10 ENCOUNTER — Emergency Department: Payer: Self-pay | Admitting: Emergency Medicine

## 2010-09-12 ENCOUNTER — Emergency Department: Payer: Self-pay | Admitting: Unknown Physician Specialty

## 2010-11-04 ENCOUNTER — Emergency Department: Payer: Self-pay | Admitting: Emergency Medicine

## 2010-11-04 IMAGING — CT CT ABD-PELV W/O CM
1 of 2 series · 15 of 32 positions shown, 19 images · non-contrast
Comparison: [DATE]

REASON FOR EXAM: (1) pain diffuse with urinary symptoms; (2) pain
COMMENTS:

PROCEDURE:     CT  - CT ABDOMEN AND PELVIS W[DATE]  [DATE]
RESULT:     Indication: Diffuse abdominal pain
TECHNIQUE: Multiple axial images from the lung bases to the symphysis pubis
were obtained without oral and without intravenous contrast.

[Series 2: stone · axial · 0.88mm/px · z∈[-498,-80]mm · 15 of 153 slices shown, 19 images]
[im 7/153  soft-tissue]
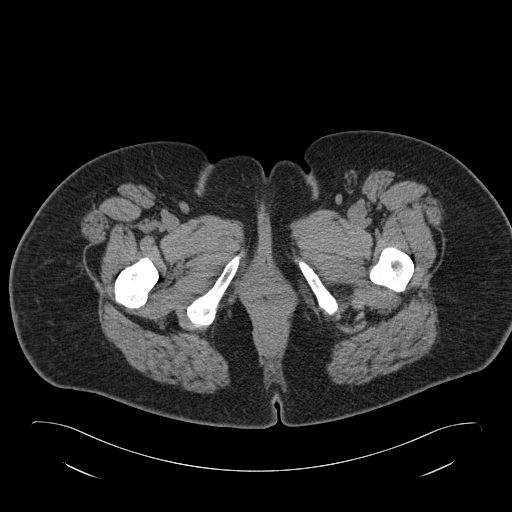
[im 7/153  bone]
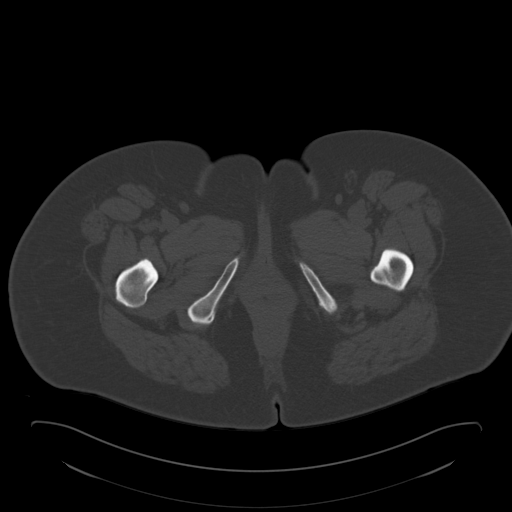
[im 20/153  soft-tissue]
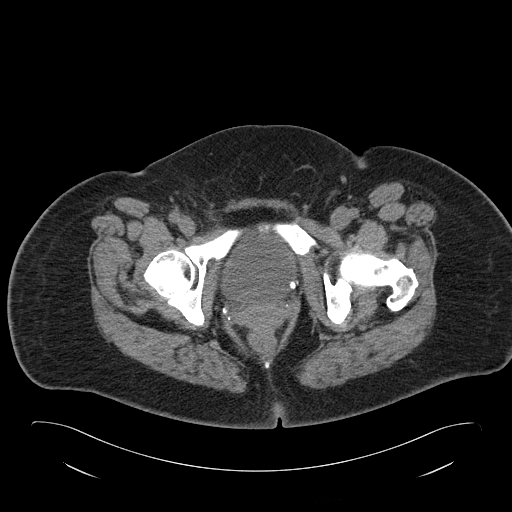
[im 32/153  soft-tissue]
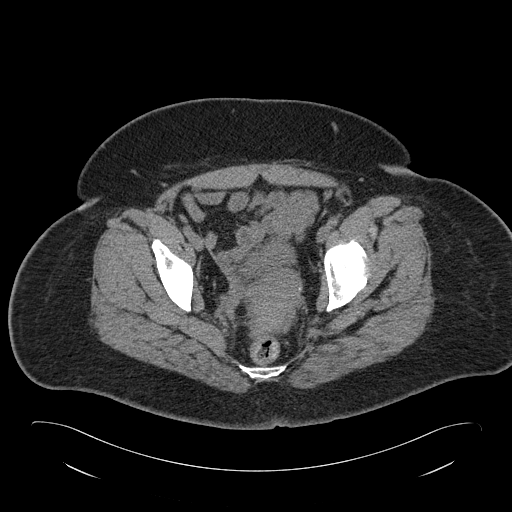
[im 45/153  soft-tissue]
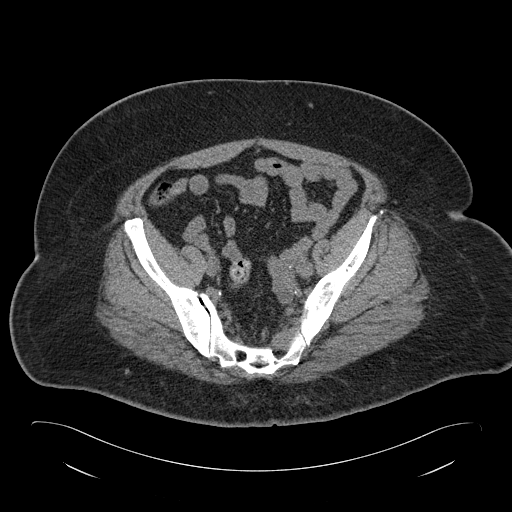
[im 51/153  soft-tissue]
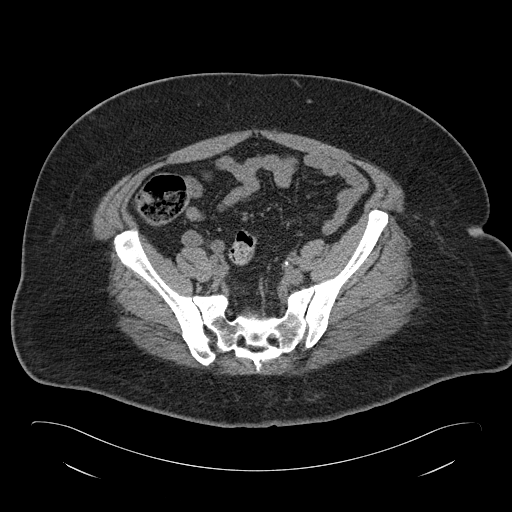
[im 64/153  soft-tissue]
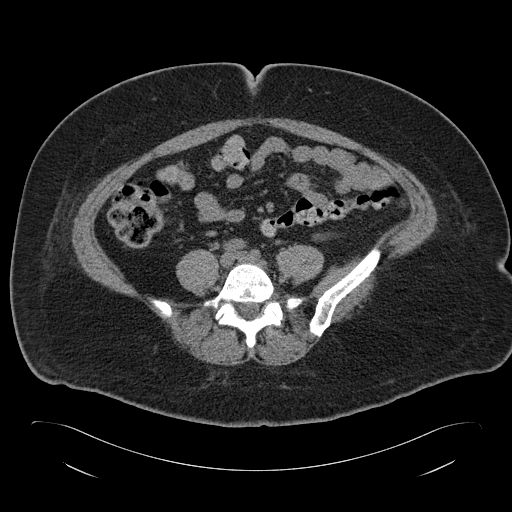
[im 77/153  soft-tissue]
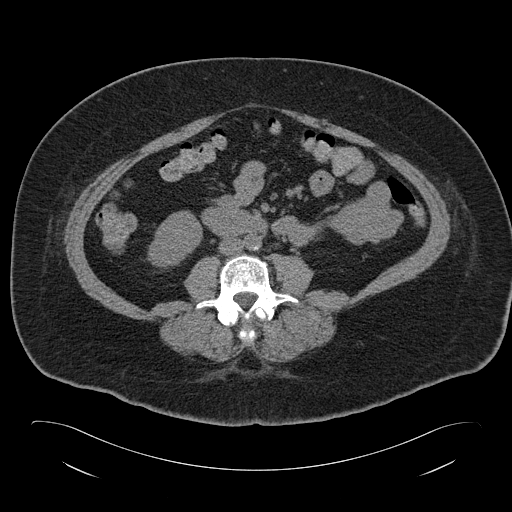
[im 89/153  soft-tissue]
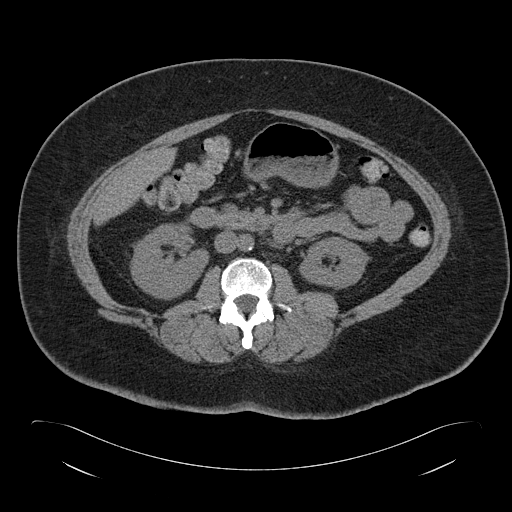
[im 102/153  soft-tissue]
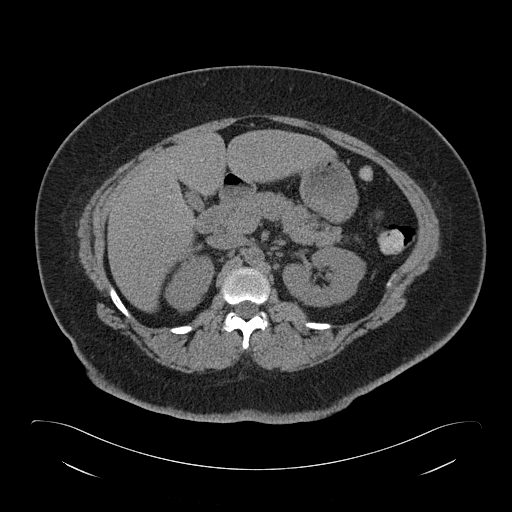
[im 102/153  bone]
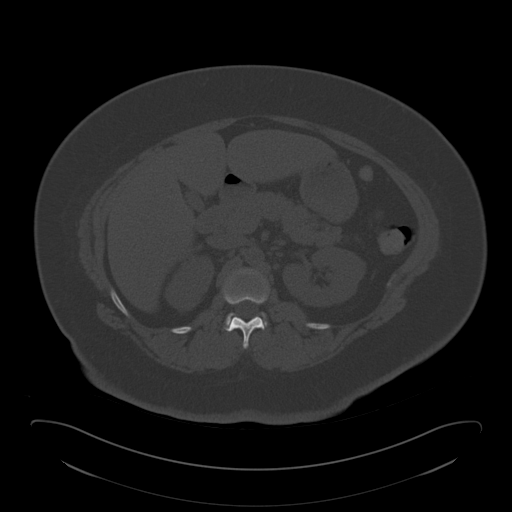
[im 108/153  soft-tissue]
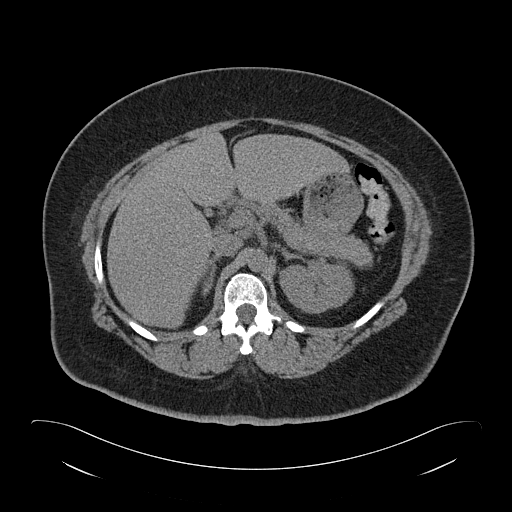
[im 121/153  soft-tissue]
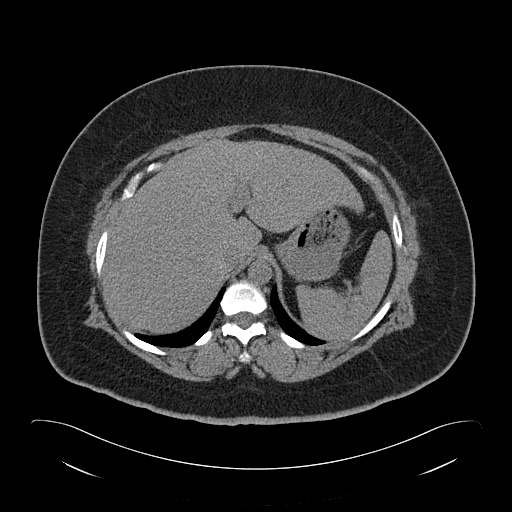
[im 127/153  lung]
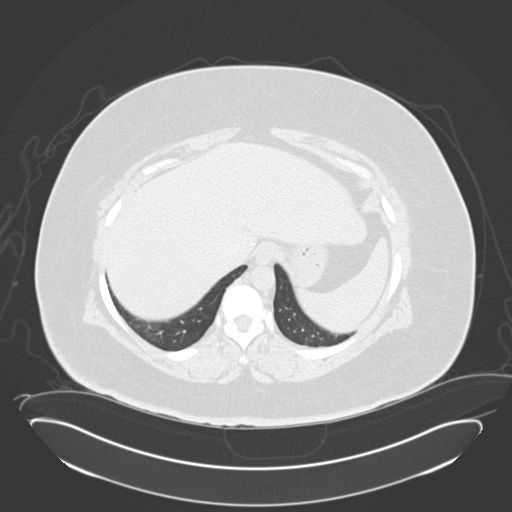
[im 134/153  soft-tissue]
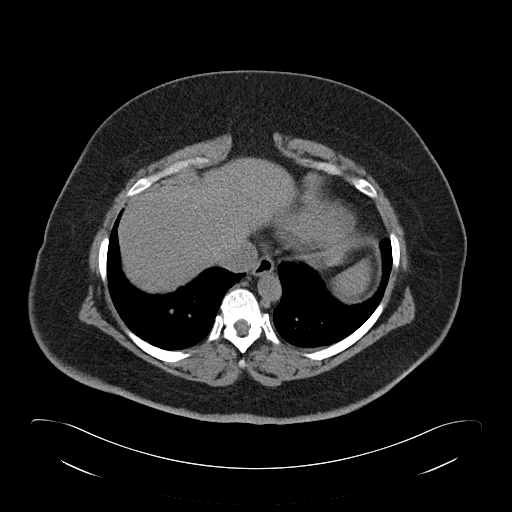
[im 134/153  lung]
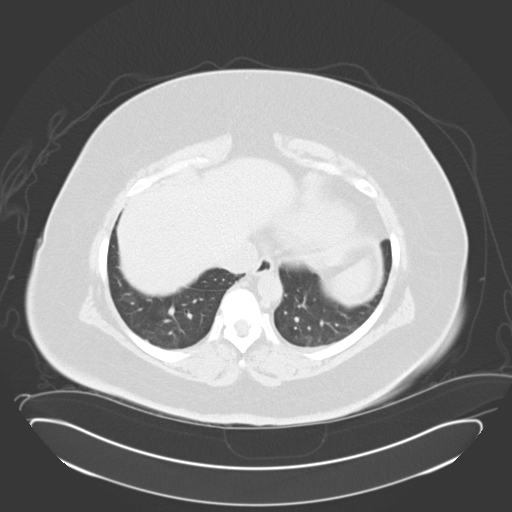
[im 140/153  lung]
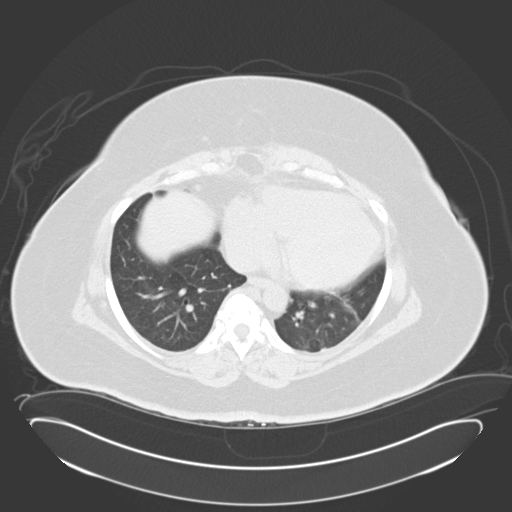
[im 146/153  soft-tissue]
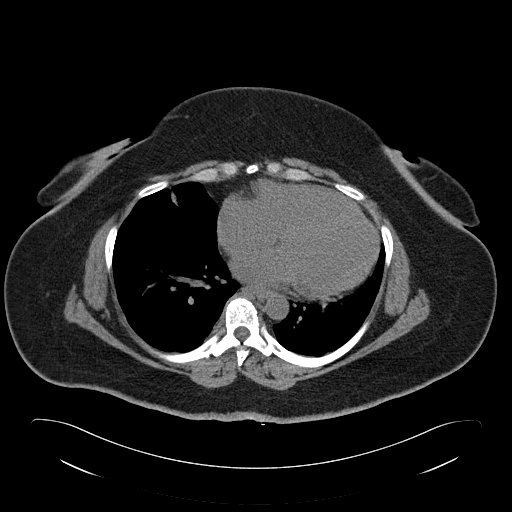
[im 146/153  lung]
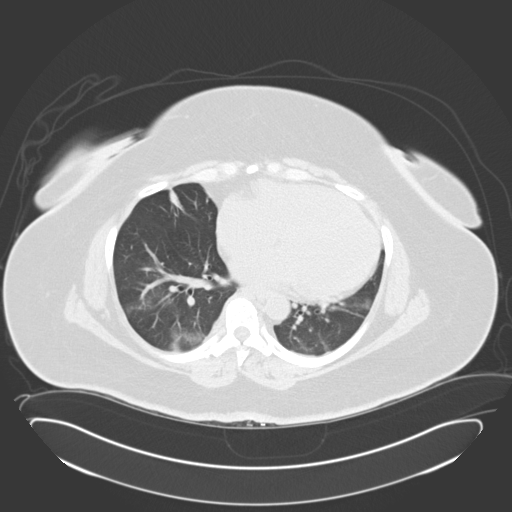

[15 of 32 positions shown; findings below may reference images not displayed]

FINDINGS: The lung bases are clear. There is no pleural or pericardial effusions.

No renal, ureteral, or bladder calculi. No obstructive uropathy. No
perinephric stranding is seen. The kidneys are symmetric in size without
evidence for exophytic mass. The bladder is unremarkable.

The liver demonstrates no focal abnormality. The gallbladder is
unremarkable. The spleen demonstrates no focal abnormality. The adrenal
glands and pancreas are normal.

The unopacified stomach, duodenum, small intestine, and large intestine are
unremarkable, but evaluation is limited by lack of oral contrast. There is a
normal caliber appendix in the right lower quadrant without periappendiceal
inflammatory changes. There is no pneumoperitoneum, pneumatosis, or portal
venous gas. There is no abdominal or pelvic free fluid. There is no
lymphadenopathy.

The abdominal aorta is normal in caliber .

The osseous structures are unremarkable.
IMPRESSION: 1. No urolithiasis or obstructive uropathy.

## 2011-01-13 ENCOUNTER — Emergency Department: Payer: Self-pay | Admitting: Emergency Medicine

## 2011-02-28 ENCOUNTER — Emergency Department: Payer: Self-pay | Admitting: Emergency Medicine

## 2011-02-28 IMAGING — CR DG CHEST 2V
1 series · 2 of 2 positions shown · non-contrast
Comparison: none

REASON FOR EXAM: cough, wheeze
COMMENTS:

[Series 1: view not recorded · 0.17mm/px · 2 of 2 slices shown]
[im 1/2]
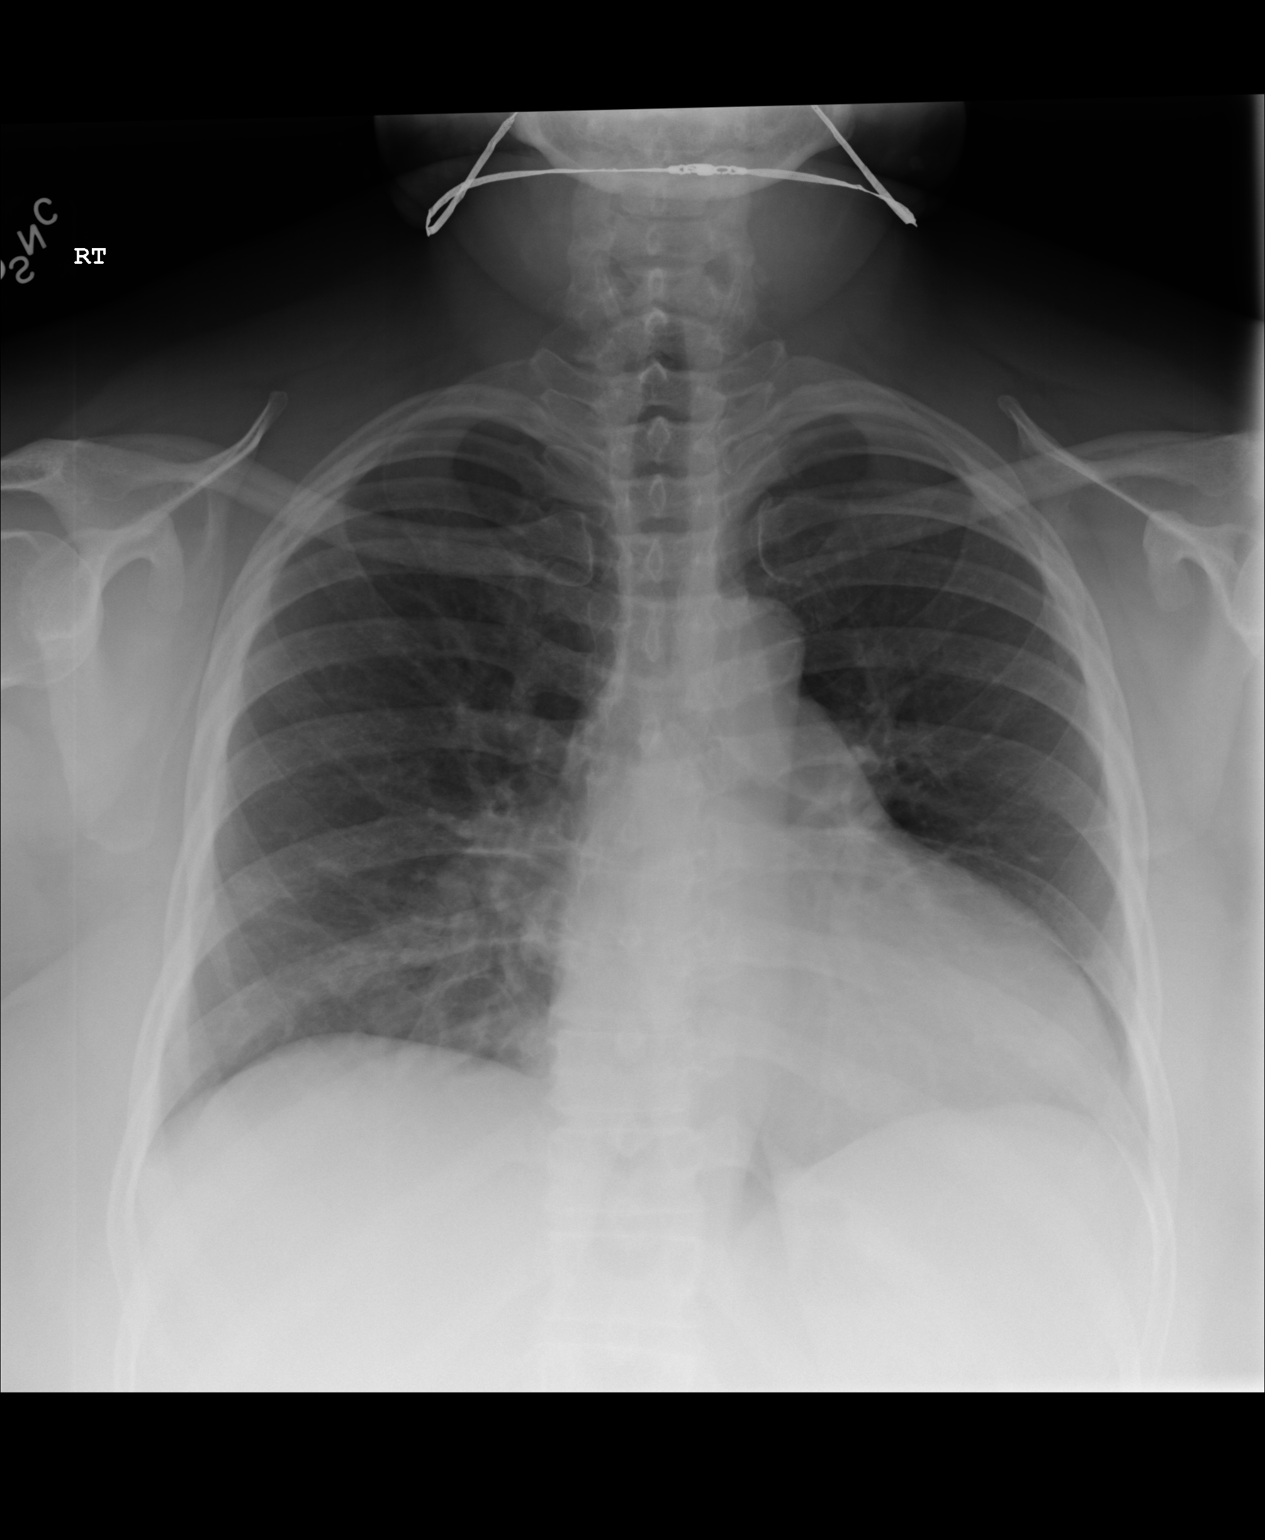
[im 2/2]
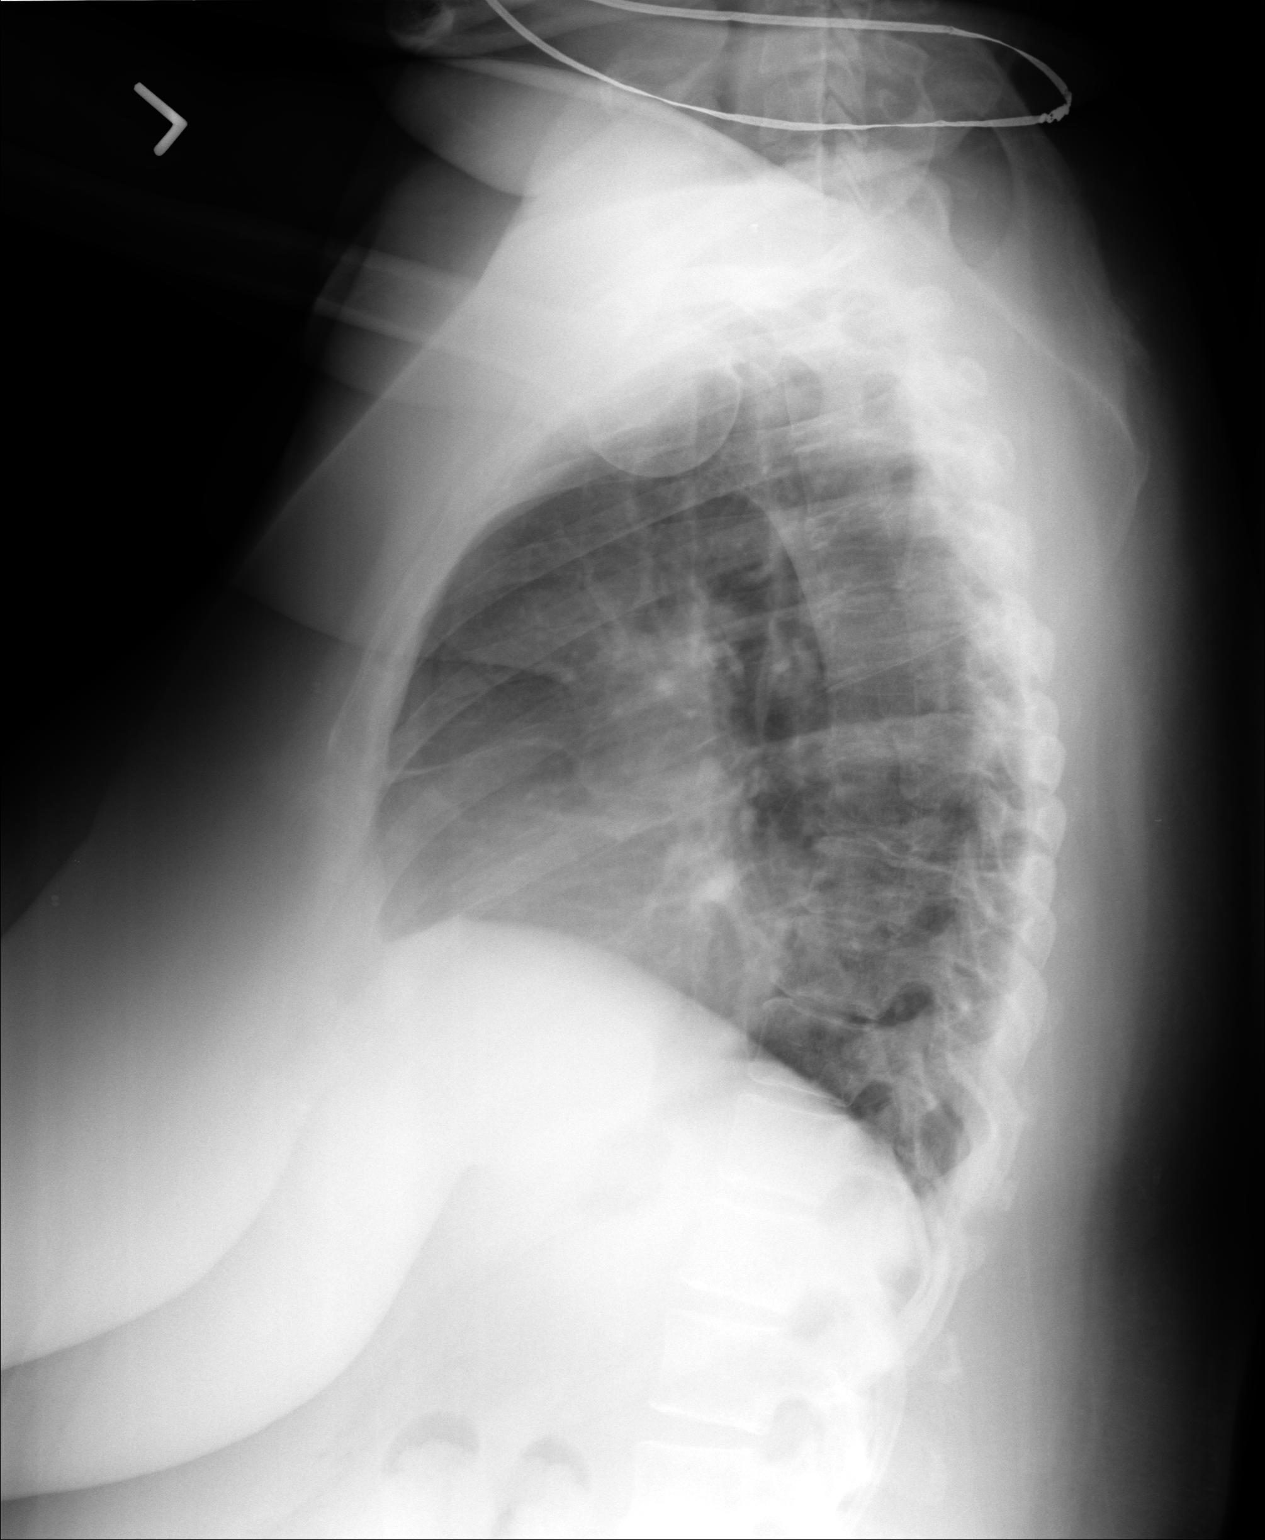

[2 of 2 positions shown; findings below may reference images not displayed]

PROCEDURE:     DXR - DXR CHEST PA (OR AP) AND LATERAL  - [DATE] [DATE]

RESULT:     Comparison is made to the prior exam of [DATE].

The lung fields are clear. No pneumonia, pneumothorax or pleural effusion is
seen. The heart is upper limits for normal in size or slightly enlarged but
stable in appearance as compared to a prior exam of [DATE]. No acute bony
abnormalities are seen.
IMPRESSION: 1.  No acute changes are identified.
2.  There is mild stable prominence of the cardiac silhouette.

## 2011-03-10 ENCOUNTER — Inpatient Hospital Stay: Payer: Self-pay | Admitting: Internal Medicine

## 2011-03-10 IMAGING — US ABDOMEN ULTRASOUND LIMITED
1 series · 17 of 25 positions shown · non-contrast
Comparison: none

REASON FOR EXAM: RUQ pain, lipase [PG]
COMMENTS:   Body Site: GB and Fossa, CBD, Head of Pancreas

PROCEDURE:     US  - US ABDOMEN LIMITED SURVEY  - [DATE]  [DATE]
RESULT:     Frontal quadrant ultrasound dated [DATE].
TECHNIQUE: Real-time transabdominal imaging was obtained.

[Series 1: abdomen ultrasound limited · 17 of 47 slices shown]
[im 1/47]
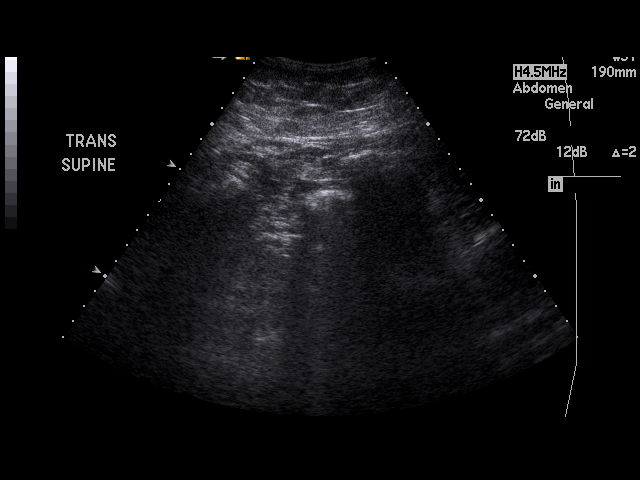
[im 4/47]
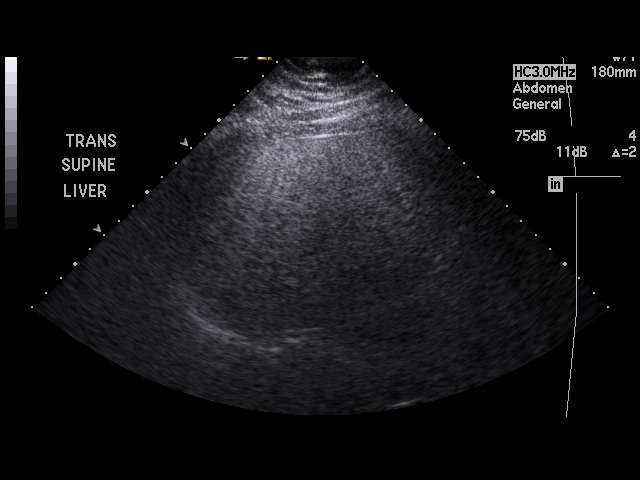
[im 6/47]
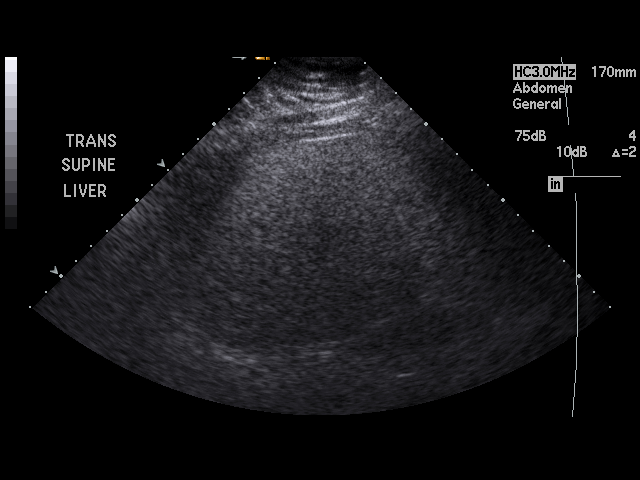
[im 10/47]
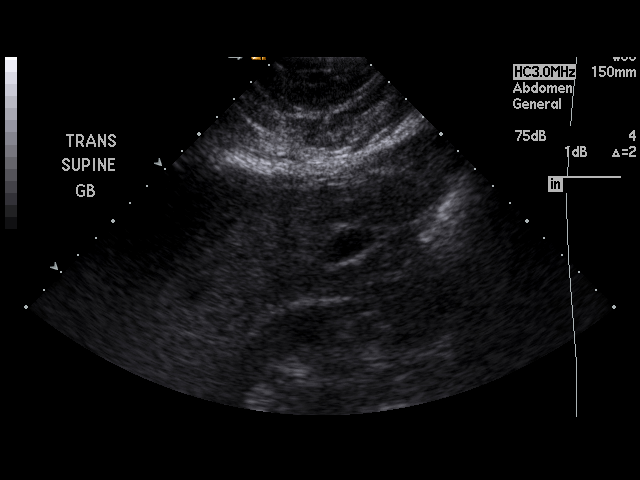
[im 12/47]
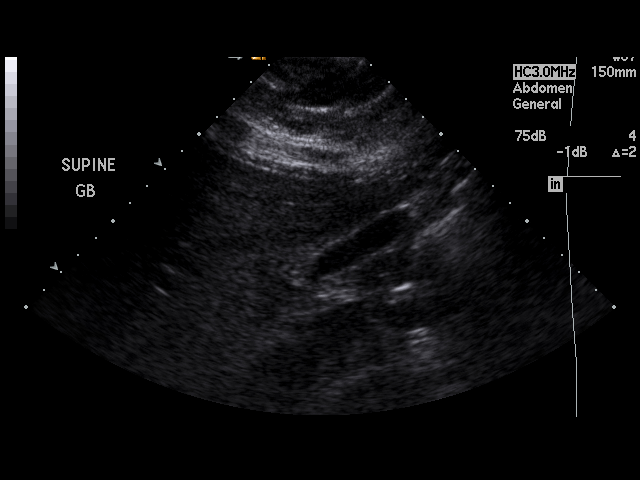
[im 16/47]
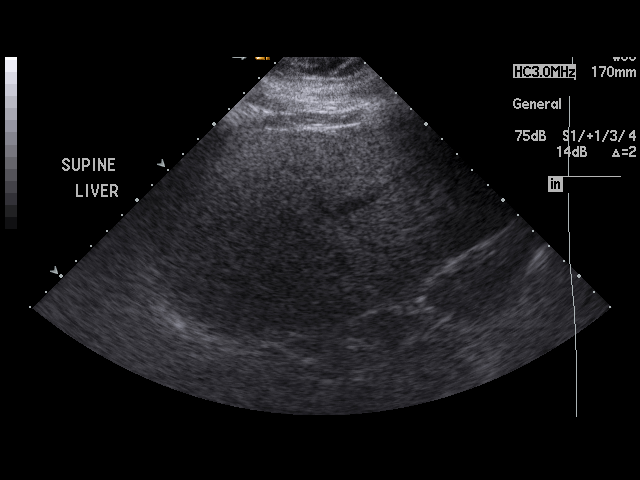
[im 18/47]
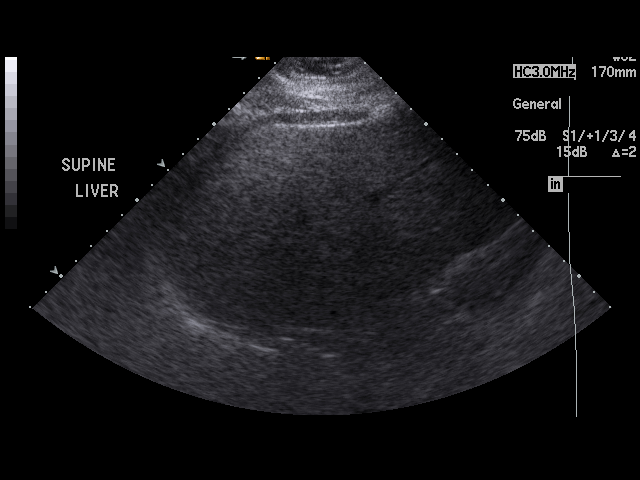
[im 22/47]
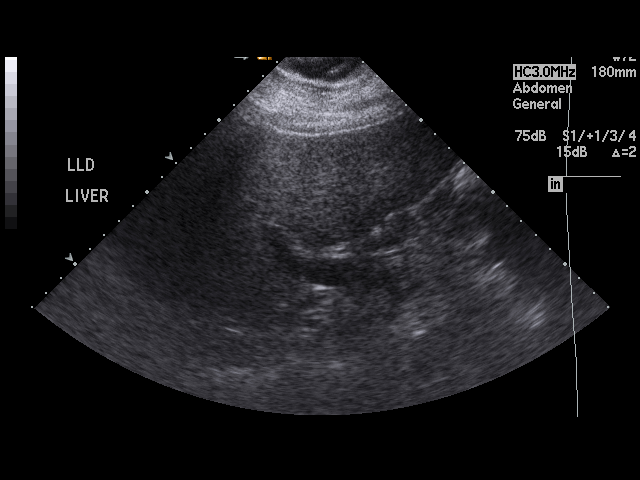
[im 24/47]
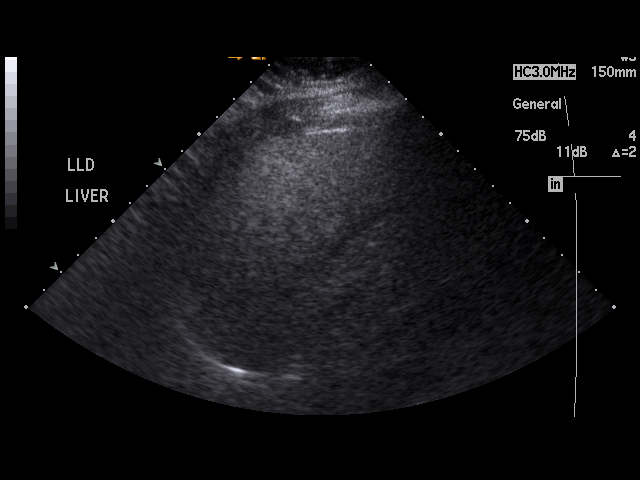
[im 25/47]
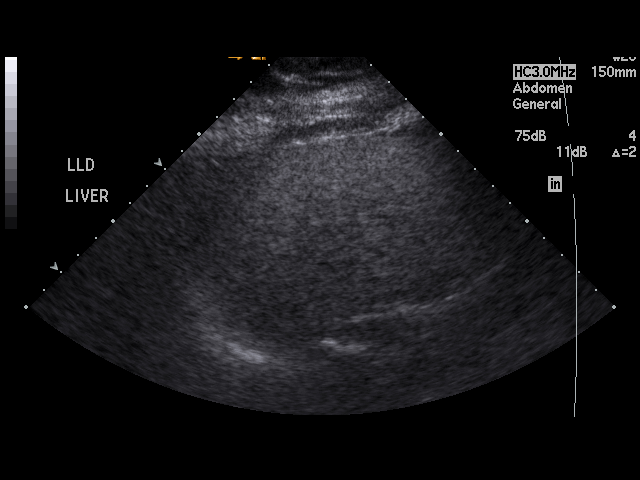
[im 29/47]
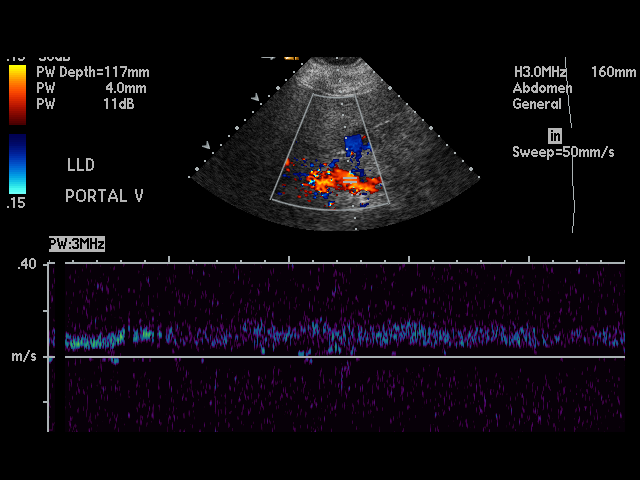
[im 31/47]
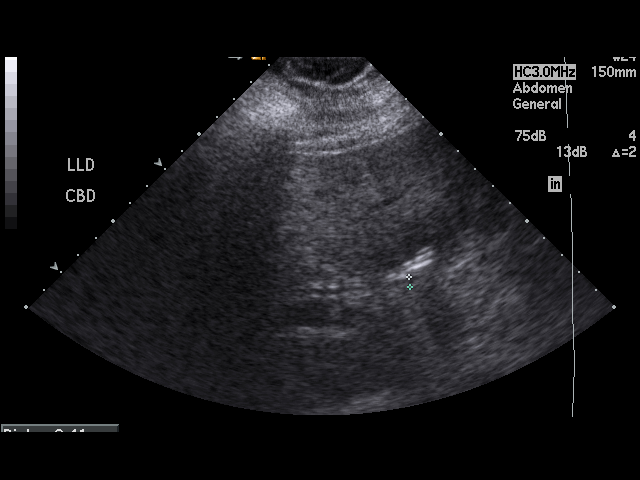
[im 35/47]
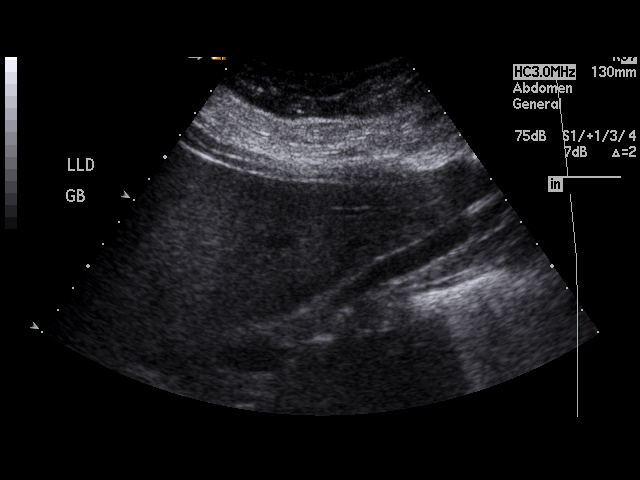
[im 37/47]
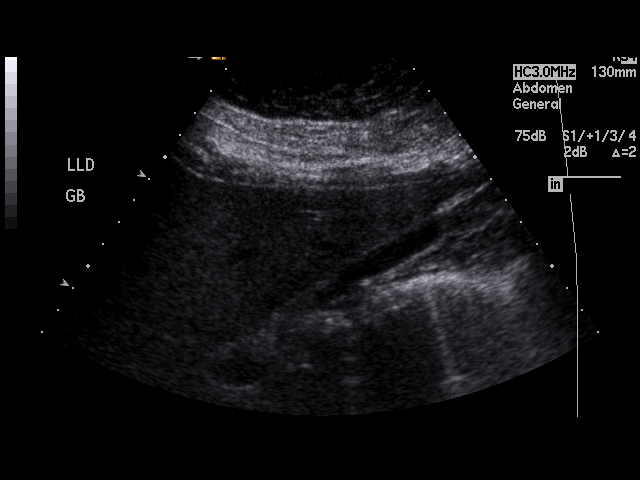
[im 41/47]
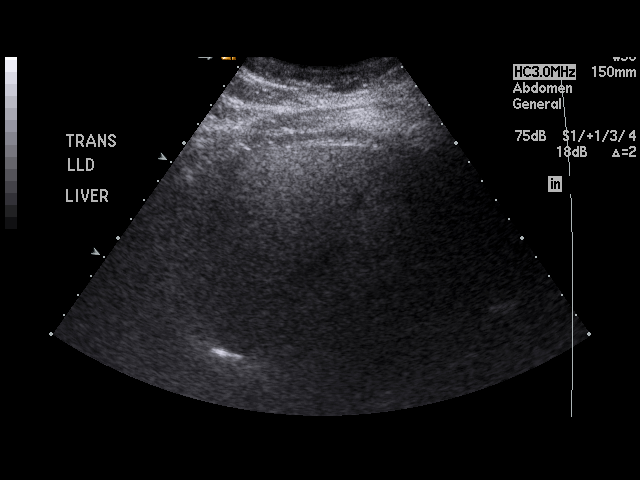
[im 43/47]
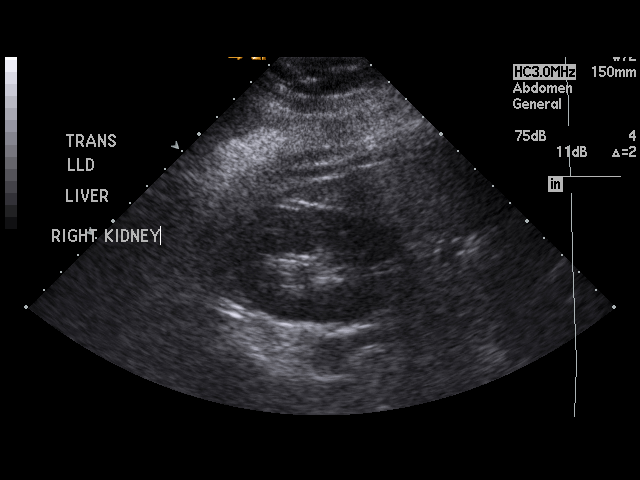
[im 47/47]
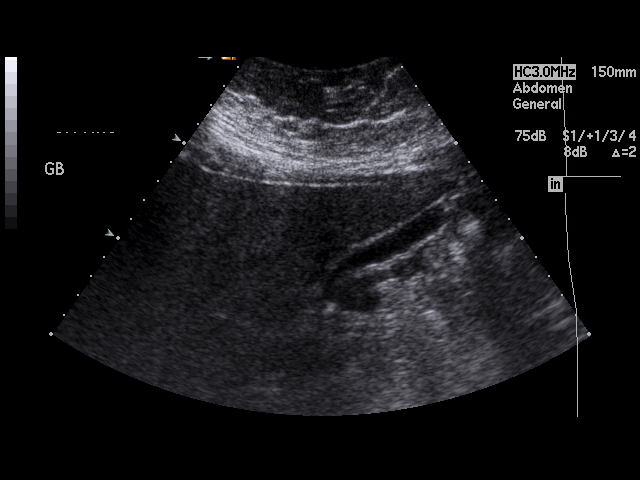

[17 of 25 positions shown; findings below may reference images not displayed]

FINDINGS: Gallbladder fossa is unremarkable there is no evidence of
pericholecystic fluid or gallstones sludging common nor gallbladder wall
thickening or sonographic Murphy sign. Lumbar orbit is 1.8 mm in thickness
the common bile duct 4.1 mm in diameter. Pancreas not visualized. Is no
evidence of abdominal fluid. The liver demonstrates a heterogeneous
echotexture. Hepato- pedal flow the flow within the portal vein.
IMPRESSION: Hepatic steatosis
2. No sonographic evidence acute cholecystitis
3. Dr. AML of the emergency department was informed of these findings via
a preliminary faxed report.

## 2011-05-25 ENCOUNTER — Observation Stay: Payer: Self-pay | Admitting: Internal Medicine

## 2011-05-25 LAB — CBC
HGB: 10.4 g/dL — ABNORMAL LOW (ref 12.0–16.0)
MCH: 28.4 pg (ref 26.0–34.0)
MCHC: 31.6 g/dL — ABNORMAL LOW (ref 32.0–36.0)
RBC: 3.68 10*6/uL — ABNORMAL LOW (ref 3.80–5.20)
RDW: 15.1 % — ABNORMAL HIGH (ref 11.5–14.5)
WBC: 8.1 10*3/uL (ref 3.6–11.0)

## 2011-05-25 LAB — HEPATIC FUNCTION PANEL A (ARMC)
Alkaline Phosphatase: 58 U/L (ref 50–136)
Bilirubin, Direct: <0.1 mg/dL (ref 0.00–0.20)
Bilirubin,Total: 0.3 mg/dL (ref 0.2–1.0)
SGOT(AST): 34 U/L (ref 15–37)
SGPT (ALT): 30 U/L

## 2011-05-25 LAB — BASIC METABOLIC PANEL
BUN: 11 mg/dL (ref 7–18)
Calcium, Total: 8.6 mg/dL (ref 8.5–10.1)
EGFR (African American): >60
EGFR (Non-African Amer.): >60
Glucose: 224 mg/dL — ABNORMAL HIGH (ref 65–99)
Osmolality: 280 (ref 275–301)
Potassium: 4 mmol/L (ref 3.5–5.1)

## 2011-05-25 LAB — LIPASE, BLOOD: Lipase: 274 U/L (ref 73–393)

## 2011-05-25 IMAGING — CR DG CHEST 2V
1 series · 2 of 2 positions shown · non-contrast
Comparison: none

REASON FOR EXAM: productive cough, SOB, CP
COMMENTS:

[Series 1: x chest ap · 0.14mm/px · 2 of 2 slices shown]
[im 1/2]
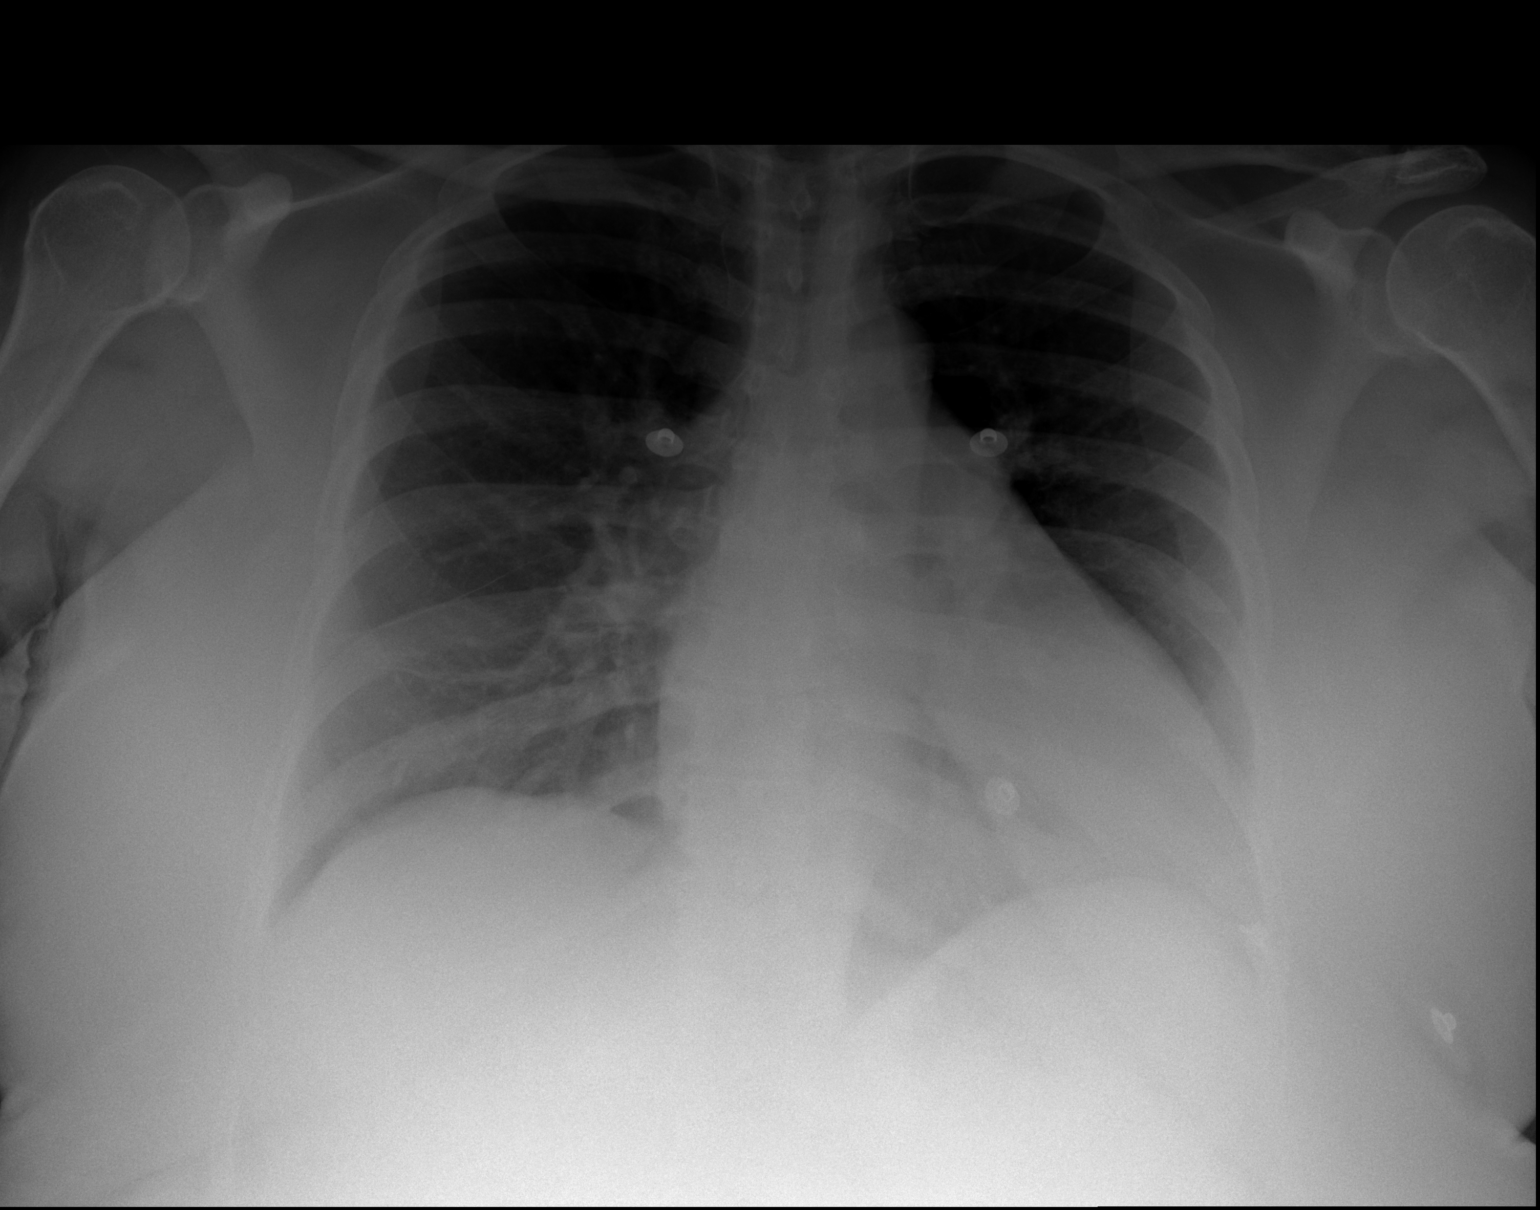
[im 2/2]
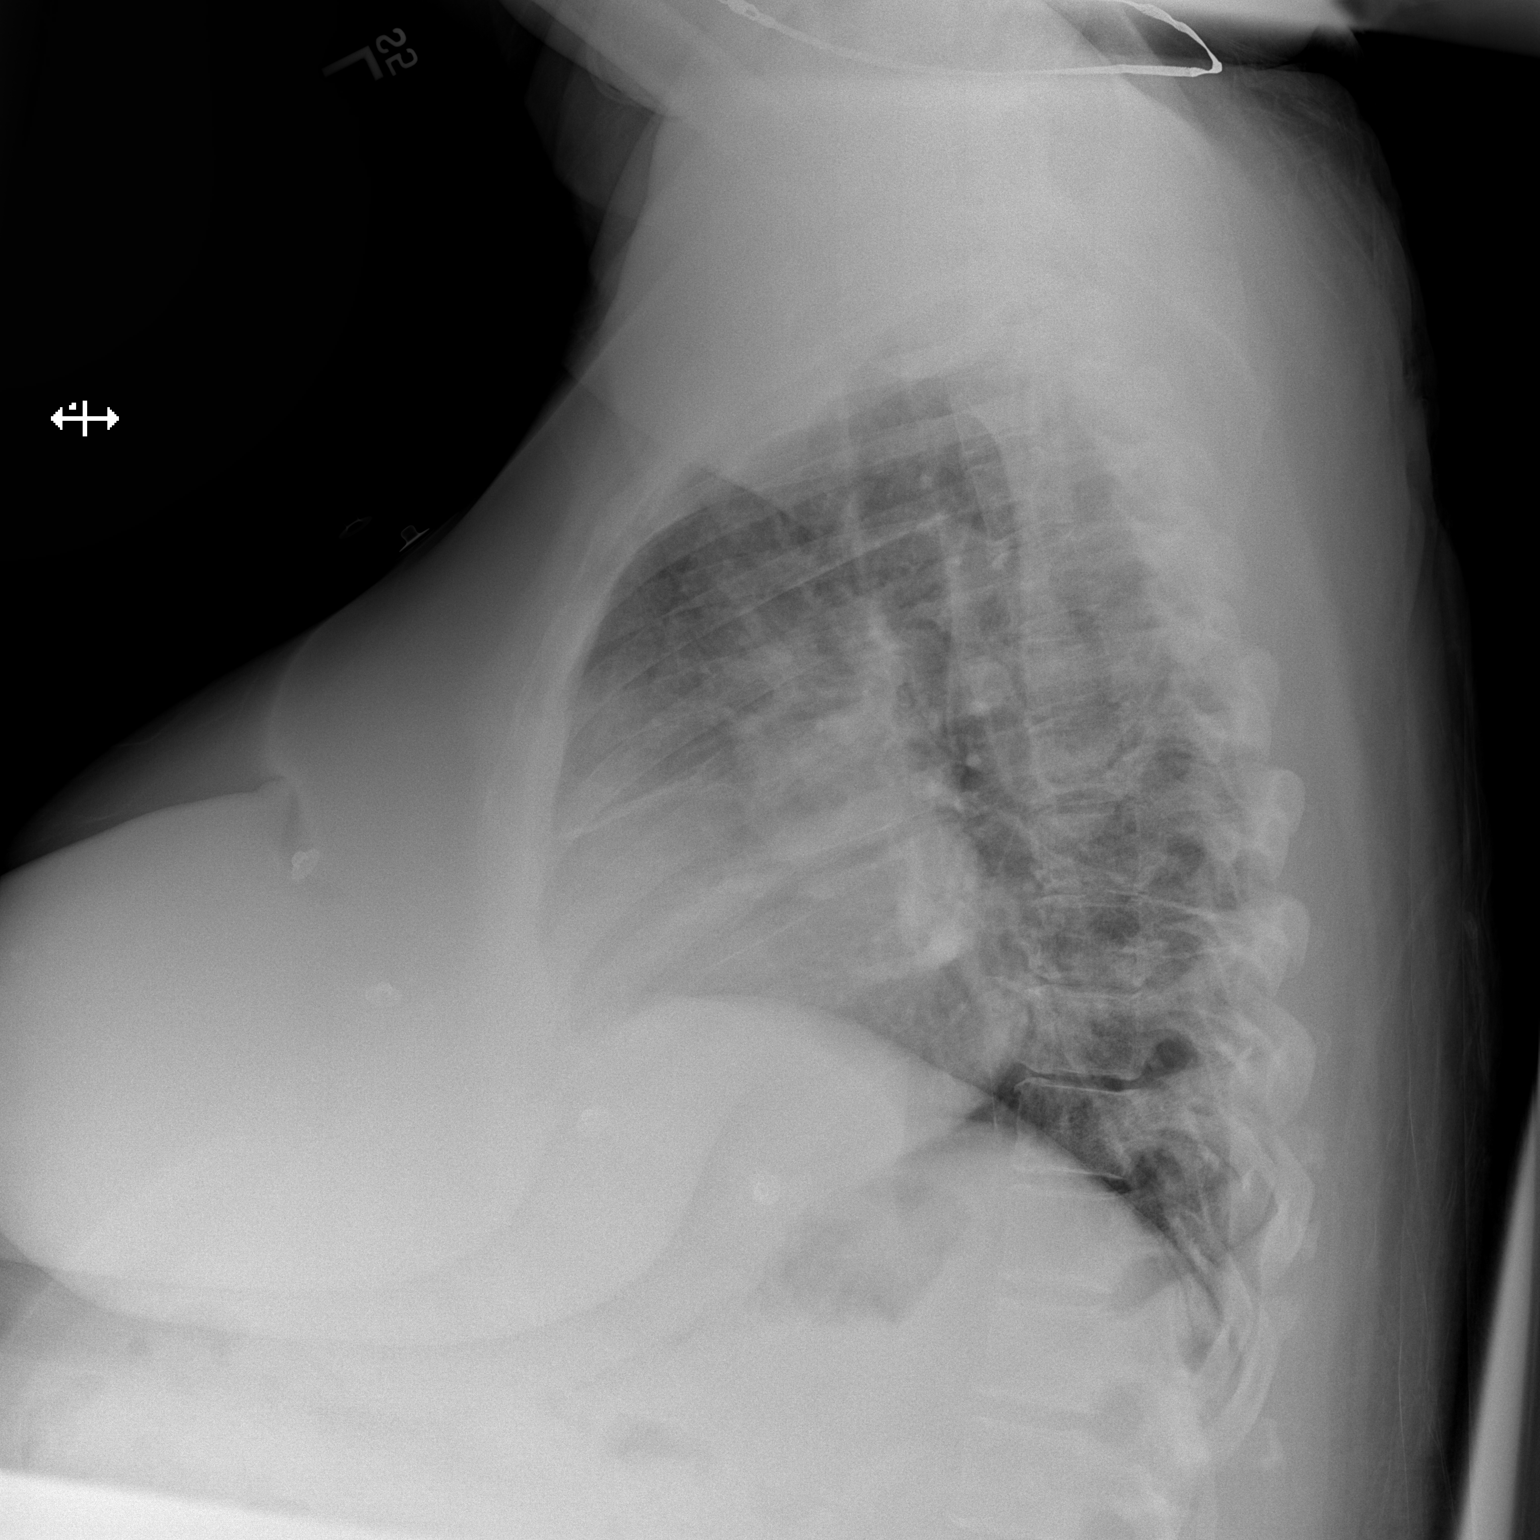

[2 of 2 positions shown; findings below may reference images not displayed]

PROCEDURE:     DXR - DXR CHEST PA (OR AP) AND LATERAL  - [DATE]  [DATE]

RESULT:     Comparison is made to the previous examination from [DATE].

The cardiac silhouette is mildly enlarged but unchanged. The lungs are clear
with the exception of some linear presumed discoid atelectasis in both lung
bases. There is no edema, effusion or pneumothorax. No discrete mass is
evident.
IMPRESSION: 1. Stable cardiomegaly.
2. Discoid areas of atelectasis.

## 2011-05-26 DIAGNOSIS — R079 Chest pain, unspecified: Secondary | ICD-10-CM

## 2011-05-26 DIAGNOSIS — I359 Nonrheumatic aortic valve disorder, unspecified: Secondary | ICD-10-CM

## 2011-05-26 LAB — HCG, QUANTITATIVE, PREGNANCY: Beta Hcg, Quant.: <1 m[IU]/mL — ABNORMAL LOW

## 2011-05-26 LAB — TROPONIN I: Troponin-I: <0.02 ng/mL

## 2011-05-26 LAB — CK-MB: CK-MB: <0.5 ng/mL — ABNORMAL LOW (ref 0.5–3.6)

## 2011-06-16 ENCOUNTER — Emergency Department: Payer: Self-pay | Admitting: *Deleted

## 2011-06-16 LAB — COMPREHENSIVE METABOLIC PANEL
Albumin: 3.7 g/dL (ref 3.4–5.0)
Alkaline Phosphatase: 56 U/L (ref 50–136)
Bilirubin,Total: 0.2 mg/dL (ref 0.2–1.0)
Calcium, Total: 10 mg/dL (ref 8.5–10.1)
Chloride: 100 mmol/L (ref 98–107)
Co2: 28 mmol/L (ref 21–32)
Creatinine: 0.97 mg/dL (ref 0.60–1.30)
EGFR (African American): >60
EGFR (Non-African Amer.): >60
Glucose: 189 mg/dL — ABNORMAL HIGH (ref 65–99)
SGOT(AST): 17 U/L (ref 15–37)
SGPT (ALT): 18 U/L

## 2011-06-16 LAB — CBC
HCT: 36 % (ref 35.0–47.0)
HGB: 11.4 g/dL — ABNORMAL LOW (ref 12.0–16.0)
MCHC: 31.5 g/dL — ABNORMAL LOW (ref 32.0–36.0)
Platelet: 397 10*3/uL (ref 150–440)
RBC: 4.12 10*6/uL (ref 3.80–5.20)
RDW: 14.2 % (ref 11.5–14.5)
WBC: 9.7 10*3/uL (ref 3.6–11.0)

## 2011-06-16 LAB — URINALYSIS, COMPLETE
Nitrite: NEGATIVE
Ph: 5 (ref 4.5–8.0)
Protein: 30
RBC,UR: 6 /HPF (ref 0–5)
Specific Gravity: 1.021 (ref 1.003–1.030)
Squamous Epithelial: 14
WBC UR: 1 /HPF (ref 0–5)

## 2011-06-16 LAB — PRO B NATRIURETIC PEPTIDE: B-Type Natriuretic Peptide: 251 pg/mL — ABNORMAL HIGH (ref 0–125)

## 2011-06-23 ENCOUNTER — Emergency Department: Payer: Self-pay | Admitting: Internal Medicine

## 2011-06-23 LAB — URINALYSIS, COMPLETE
Bacteria: NONE SEEN
Bilirubin,UR: NEGATIVE
Ketone: NEGATIVE
Ph: 5 (ref 4.5–8.0)
Protein: NEGATIVE
RBC,UR: 2 /HPF (ref 0–5)
Specific Gravity: 1.021 (ref 1.003–1.030)
Squamous Epithelial: <1
WBC UR: 1 /HPF (ref 0–5)

## 2011-06-23 LAB — WET PREP, GENITAL

## 2011-06-23 LAB — PREGNANCY, URINE: Pregnancy Test, Urine: NEGATIVE m[IU]/mL

## 2011-06-23 IMAGING — US US PELV - US TRANSVAGINAL
1 series · 13 of 25 positions shown · non-contrast
Comparison: none

REASON FOR EXAM: pain pelvis
COMMENTS:

[Series 1: us pelv - us transvaginal · 0.39mm/px · 13 of 86 slices shown]
[im 1/86]
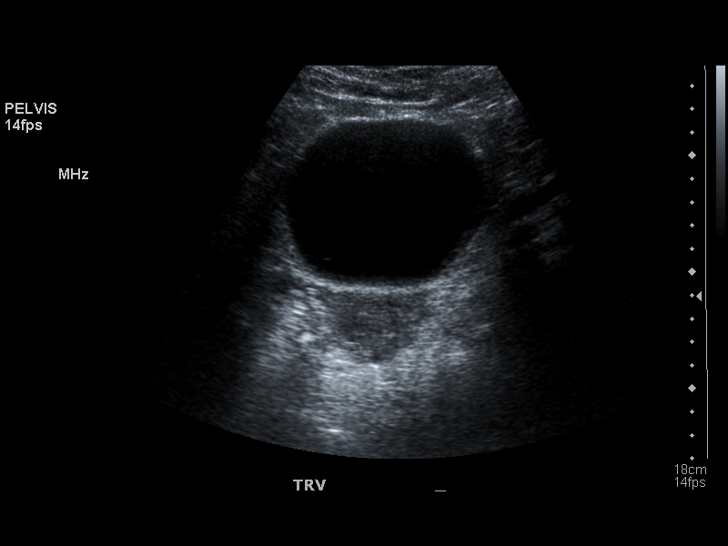
[im 8/86]
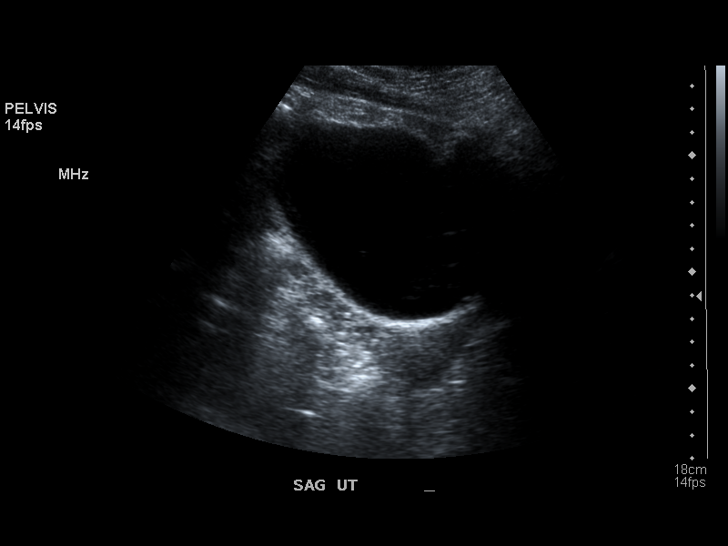
[im 15/86]
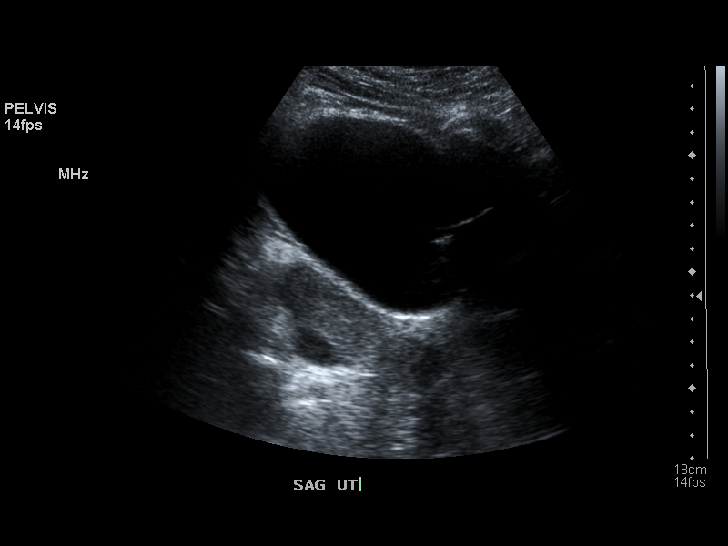
[im 22/86]
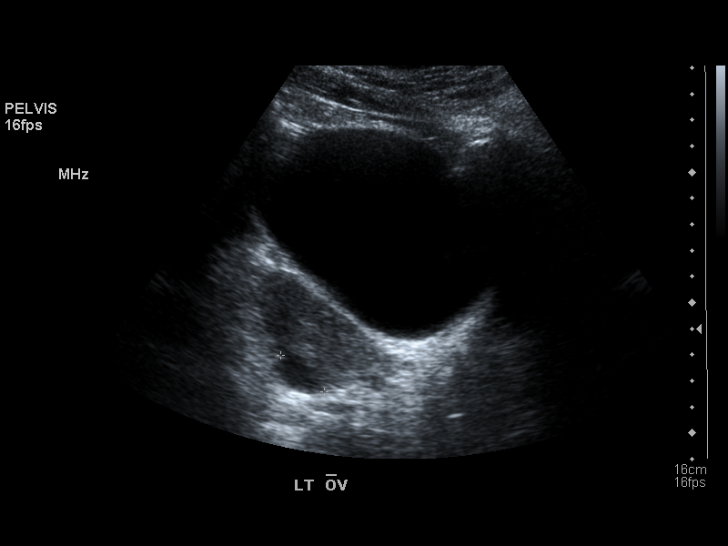
[im 29/86]
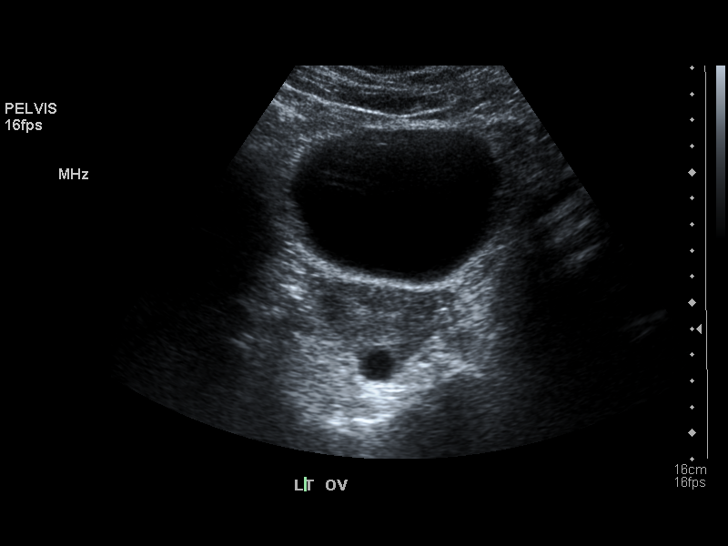
[im 36/86]
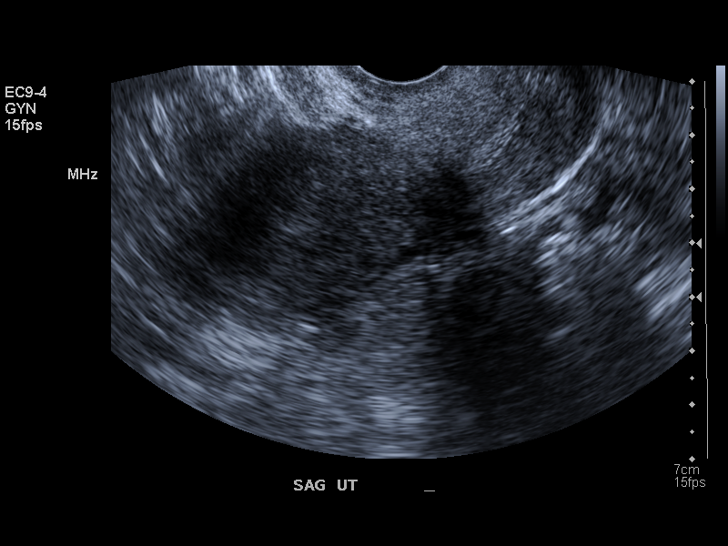
[im 43/86]
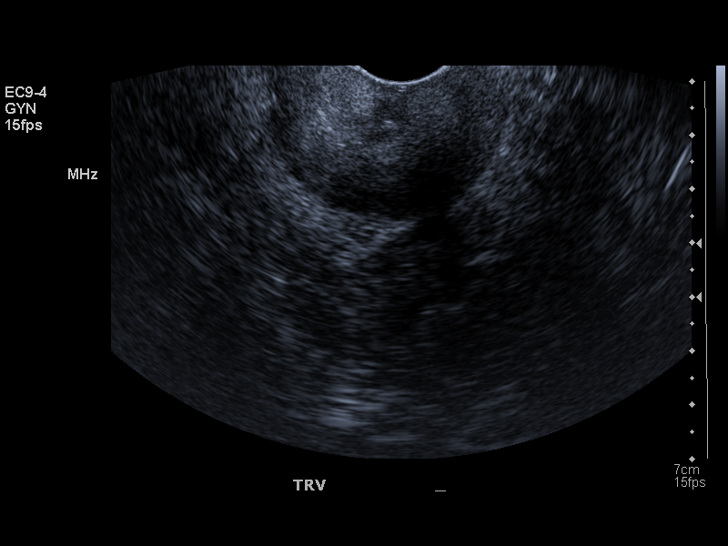
[im 50/86]
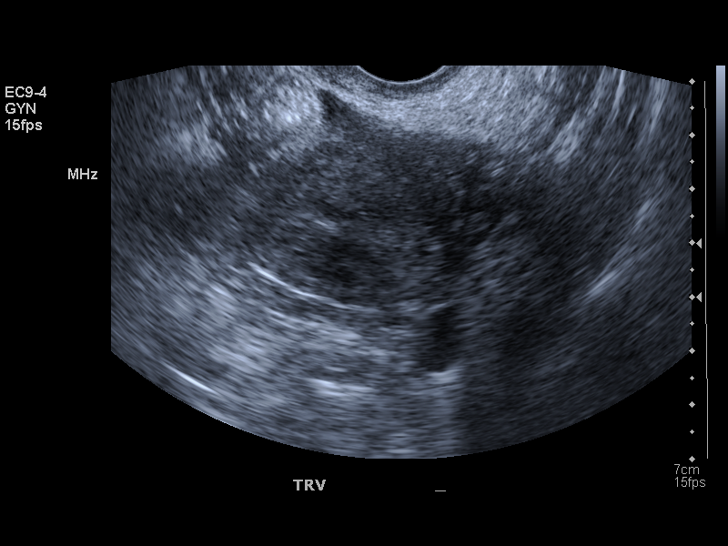
[im 57/86]
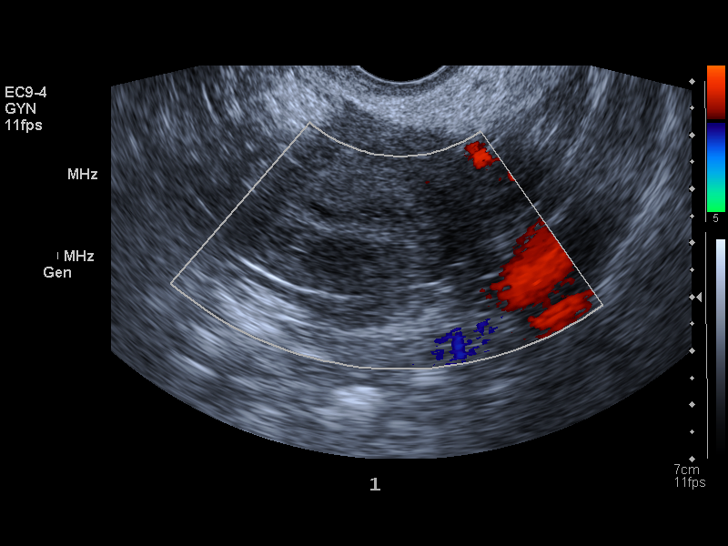
[im 64/86]
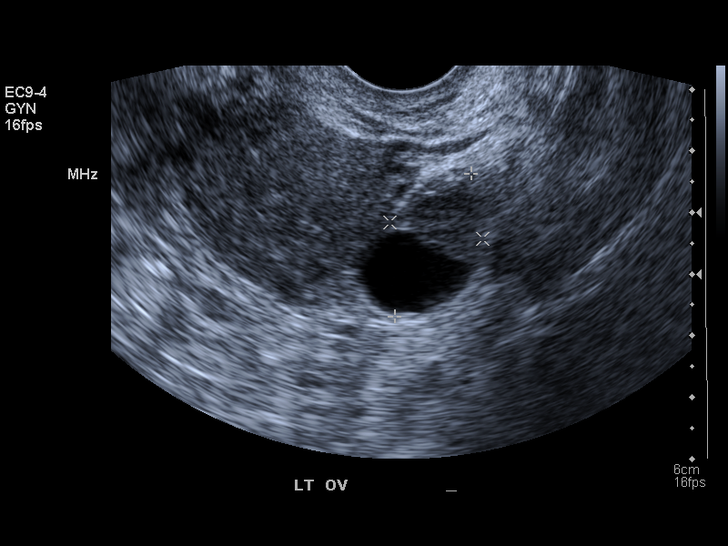
[im 71/86]
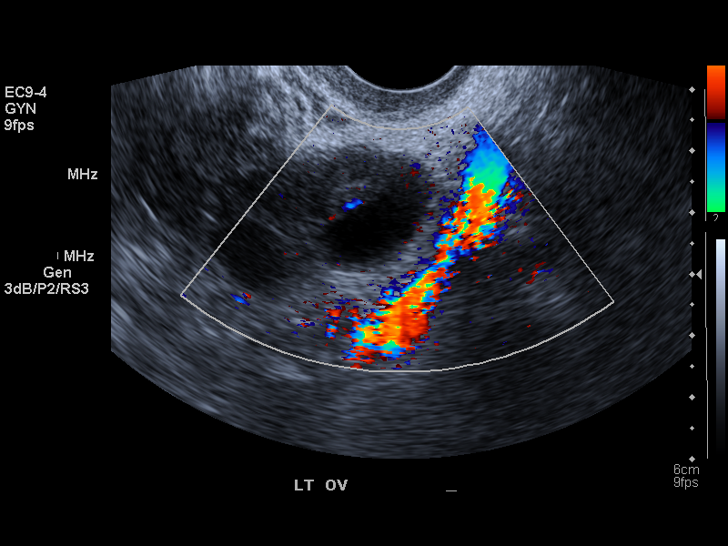
[im 78/86]
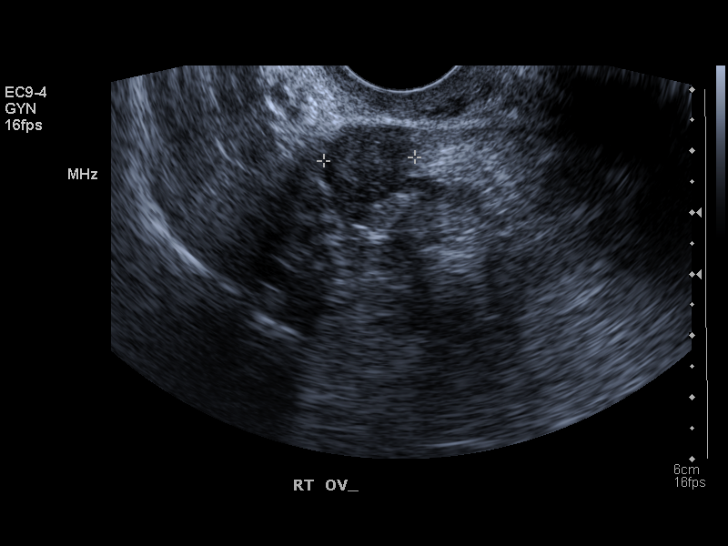
[im 86/86]
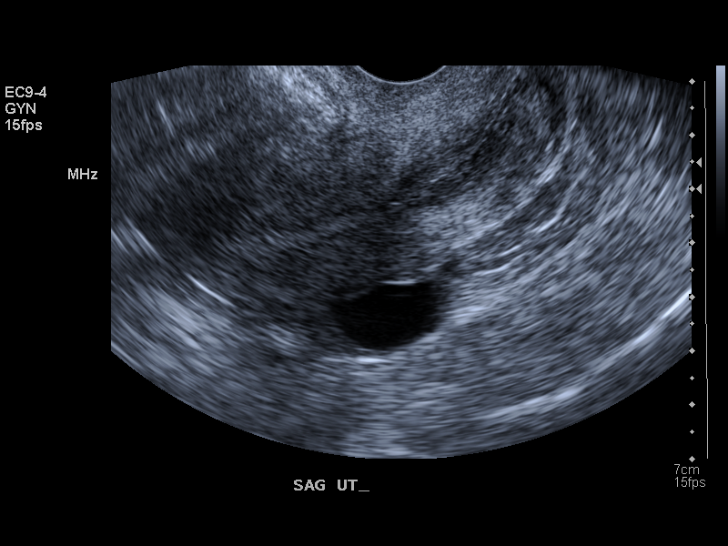

[13 of 25 positions shown; findings below may reference images not displayed]

PROCEDURE:     US  - US PELVIS EXAM W/TRANSVAGINAL  - [DATE] [DATE]

RESULT:     Transabdominal and endovaginal ultrasound was performed. The
uterus measures 8.56 cm x 4.72 cm x 2.5 cm. There are noted two uterine
masses consistent with uterine fibroids. The larger is located posteriorly
in the uterus and measures 1.33 cm at maximum diameter. The smaller is
located anteriorly in the fundus and measures 8 mm in diameter which has not
appreciably changed as compared to the exam of [DATE]. The
endometrium measures 3.1 mm in thickness. The right and left ovaries are
visualized. The right ring measures 2.26 cm at maximum diameter and the left
ovary measures 2.63 cm at maximum diameter. There is incidentally noted a
2.17 cm cyst of the left ovary. Vascular flow is observed in each ovary on
Doppler examination. No abnormal adnexal masses are seen. No free fluid is
observed in the pelvis. The visualized portion of the urinary bladder is
normal in appearance. The kidneys are visualized bilaterally and show no
hydronephrosis.
IMPRESSION: 1. There are noted two small hypoechoic masses in the uterus consistent with
small uterine fibroids and with the larger measuring 1.33 cm at maximum
diameter.
2. There is incidentally noted a 2.17 cm simple cyst of the left ovary.
3. No abnormal adnexal masses are noted.
4. No free fluid is seen in the pelvis.

## 2011-07-25 ENCOUNTER — Emergency Department: Payer: Self-pay | Admitting: Emergency Medicine

## 2011-08-03 ENCOUNTER — Emergency Department: Payer: Self-pay | Admitting: Emergency Medicine

## 2011-08-03 LAB — COMPREHENSIVE METABOLIC PANEL
Albumin: 3.5 g/dL (ref 3.4–5.0)
Anion Gap: 10 (ref 7–16)
Calcium, Total: 9.2 mg/dL (ref 8.5–10.1)
Co2: 27 mmol/L (ref 21–32)
EGFR (African American): >60
Glucose: 252 mg/dL — ABNORMAL HIGH (ref 65–99)
Potassium: 3.9 mmol/L (ref 3.5–5.1)
SGOT(AST): 23 U/L (ref 15–37)

## 2011-08-03 LAB — CBC
HCT: 35.2 % (ref 35.0–47.0)
HGB: 11.1 g/dL — ABNORMAL LOW (ref 12.0–16.0)
MCH: 28 pg (ref 26.0–34.0)
MCHC: 31.5 g/dL — ABNORMAL LOW (ref 32.0–36.0)
MCV: 89 fL (ref 80–100)

## 2011-08-03 LAB — URINALYSIS, COMPLETE
Bilirubin,UR: NEGATIVE
Blood: NEGATIVE
Ph: 5 (ref 4.5–8.0)
Specific Gravity: 1.016 (ref 1.003–1.030)
WBC UR: 1 /HPF (ref 0–5)

## 2011-08-03 LAB — TROPONIN I: Troponin-I: <0.02 ng/mL

## 2011-08-03 IMAGING — CR DG CHEST 2V
1 series · 2 of 2 positions shown · non-contrast
Comparison: none

REASON FOR EXAM: chest pain
COMMENTS:

PROCEDURE:     DXR - DXR CHEST PA (OR AP) AND LATERAL  - [DATE]  [DATE]
RESULT:     Comparison made to prior study of [DATE]. Mild atelectasis
noted in the right lung base. Mild infiltrate cannot be excluded. Heart size
stable.

[Series 1: w chest pa · 0.14mm/px · 2 of 2 slices shown]
[im 1/2]
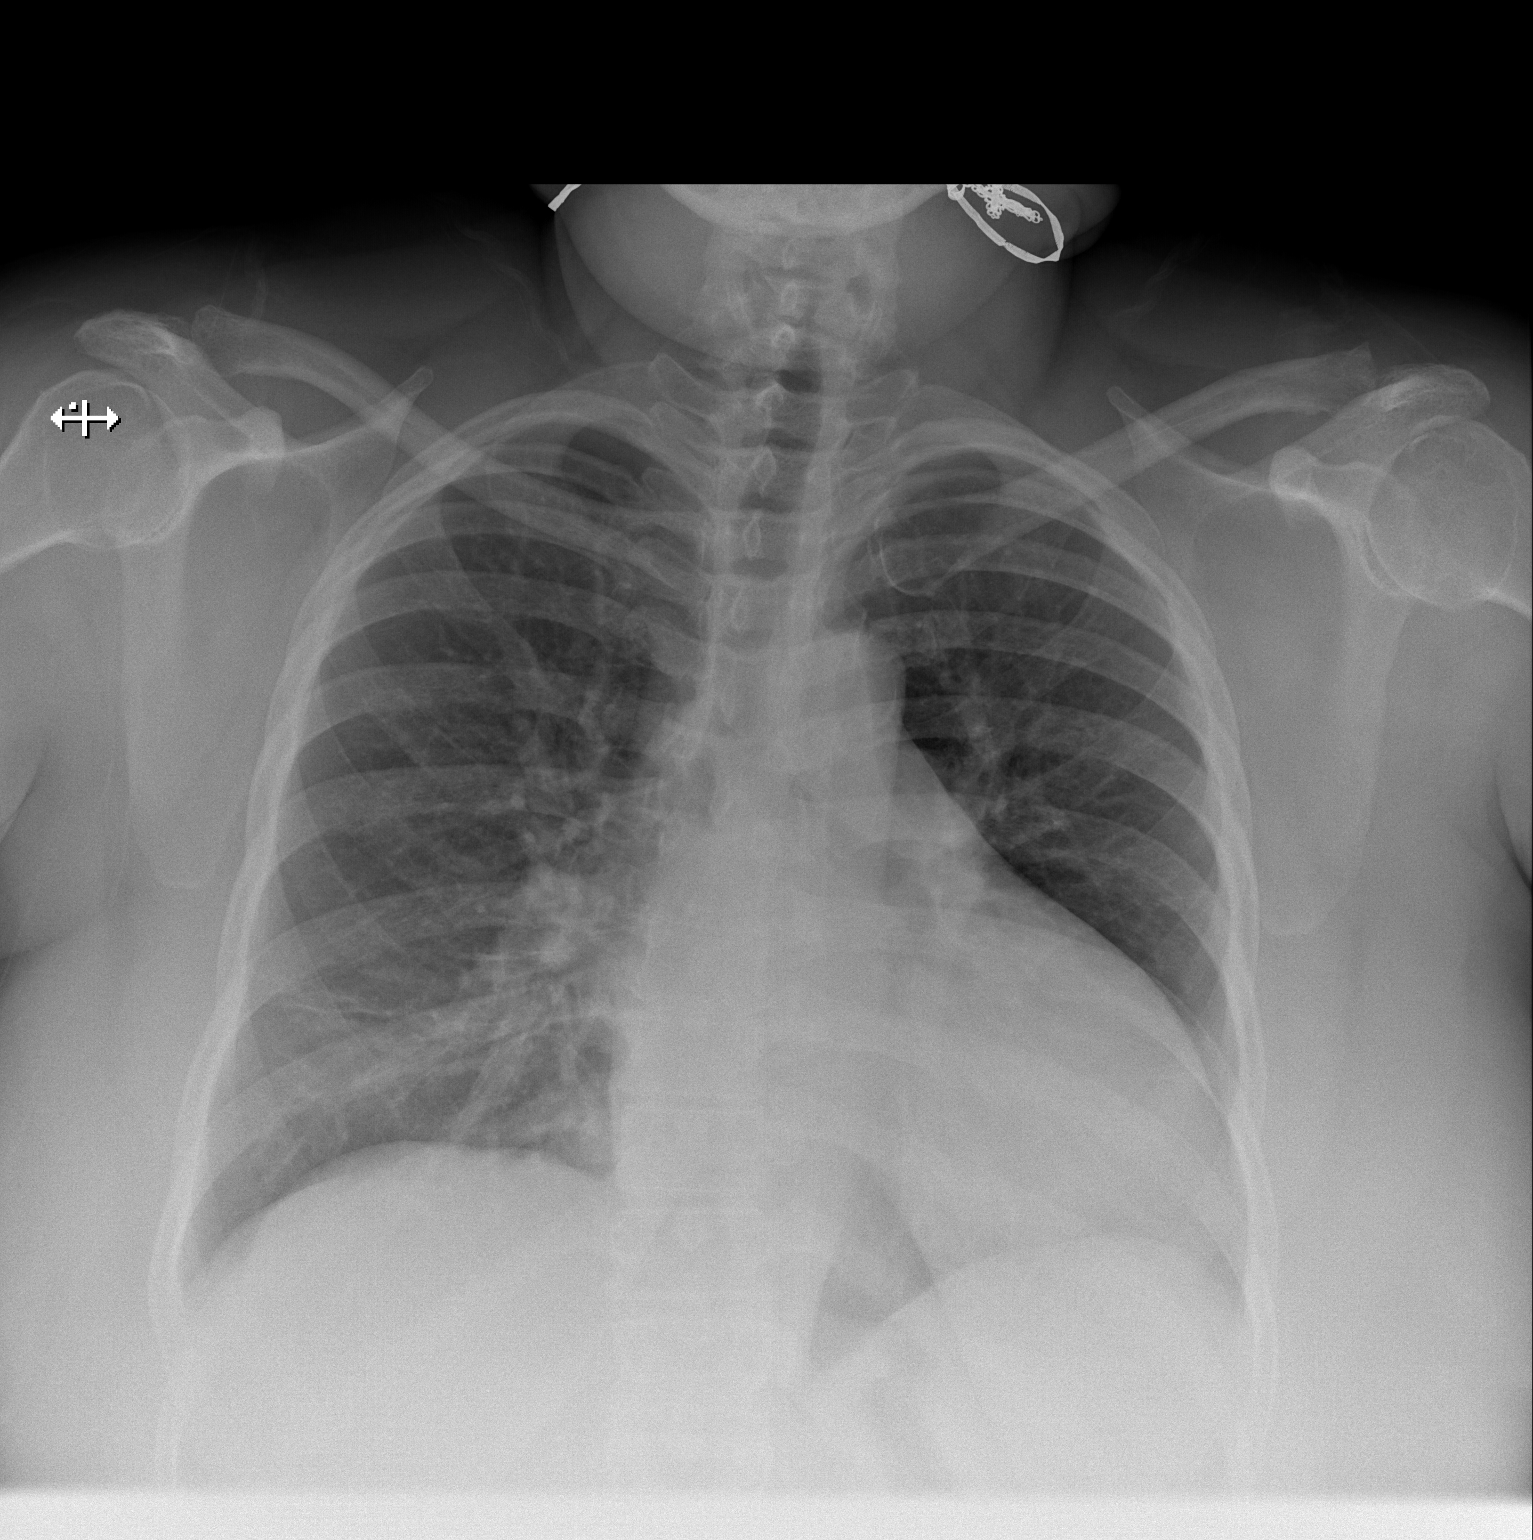
[im 2/2]
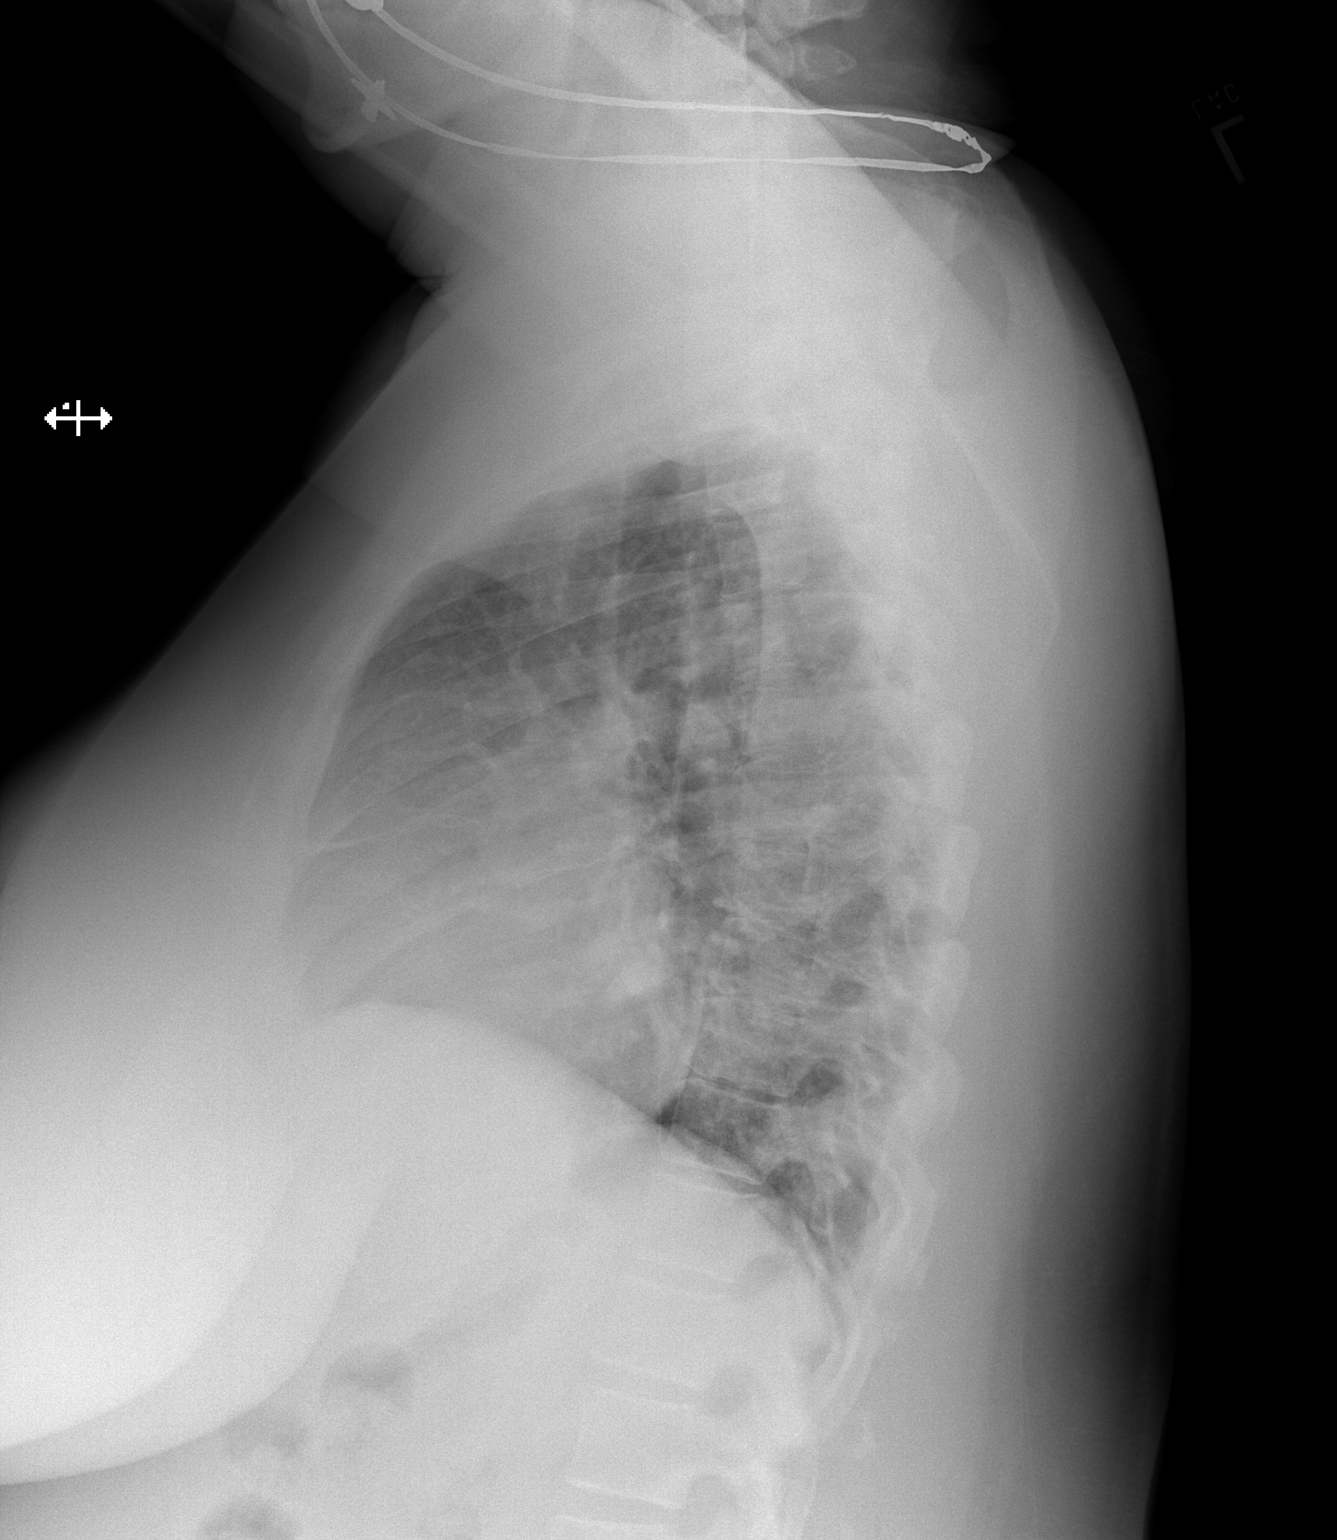

[2 of 2 positions shown; findings below may reference images not displayed]

IMPRESSION: Mild atelectasis right lower lobe. Mild pneumonia cannot be
excluded. This is a new finding from prior exam.

## 2011-09-30 ENCOUNTER — Emergency Department: Payer: Self-pay | Admitting: Emergency Medicine

## 2011-09-30 LAB — CBC
HCT: 35.8 % (ref 35.0–47.0)
HGB: 11.4 g/dL — ABNORMAL LOW (ref 12.0–16.0)
MCH: 29 pg (ref 26.0–34.0)
MCHC: 31.7 g/dL — ABNORMAL LOW (ref 32.0–36.0)
Platelet: 321 10*3/uL (ref 150–440)
RBC: 3.92 10*6/uL (ref 3.80–5.20)
WBC: 8.6 10*3/uL (ref 3.6–11.0)

## 2011-09-30 LAB — BASIC METABOLIC PANEL
Anion Gap: 7 (ref 7–16)
BUN: 8 mg/dL (ref 7–18)
Calcium, Total: 9.1 mg/dL (ref 8.5–10.1)
Chloride: 103 mmol/L (ref 98–107)
Creatinine: 1.02 mg/dL (ref 0.60–1.30)
EGFR (Non-African Amer.): >60
Potassium: 3.7 mmol/L (ref 3.5–5.1)

## 2011-09-30 LAB — TROPONIN I: Troponin-I: <0.02 ng/mL

## 2011-09-30 LAB — PRO B NATRIURETIC PEPTIDE: B-Type Natriuretic Peptide: 663 pg/mL — ABNORMAL HIGH (ref 0–125)

## 2011-09-30 IMAGING — CR DG CHEST 2V
1 series · 2 of 2 positions shown · non-contrast
Comparison: none

REASON FOR EXAM: SOB
COMMENTS:

[Series 1: w chest pa · 0.14mm/px · 2 of 2 slices shown]
[im 1/2]
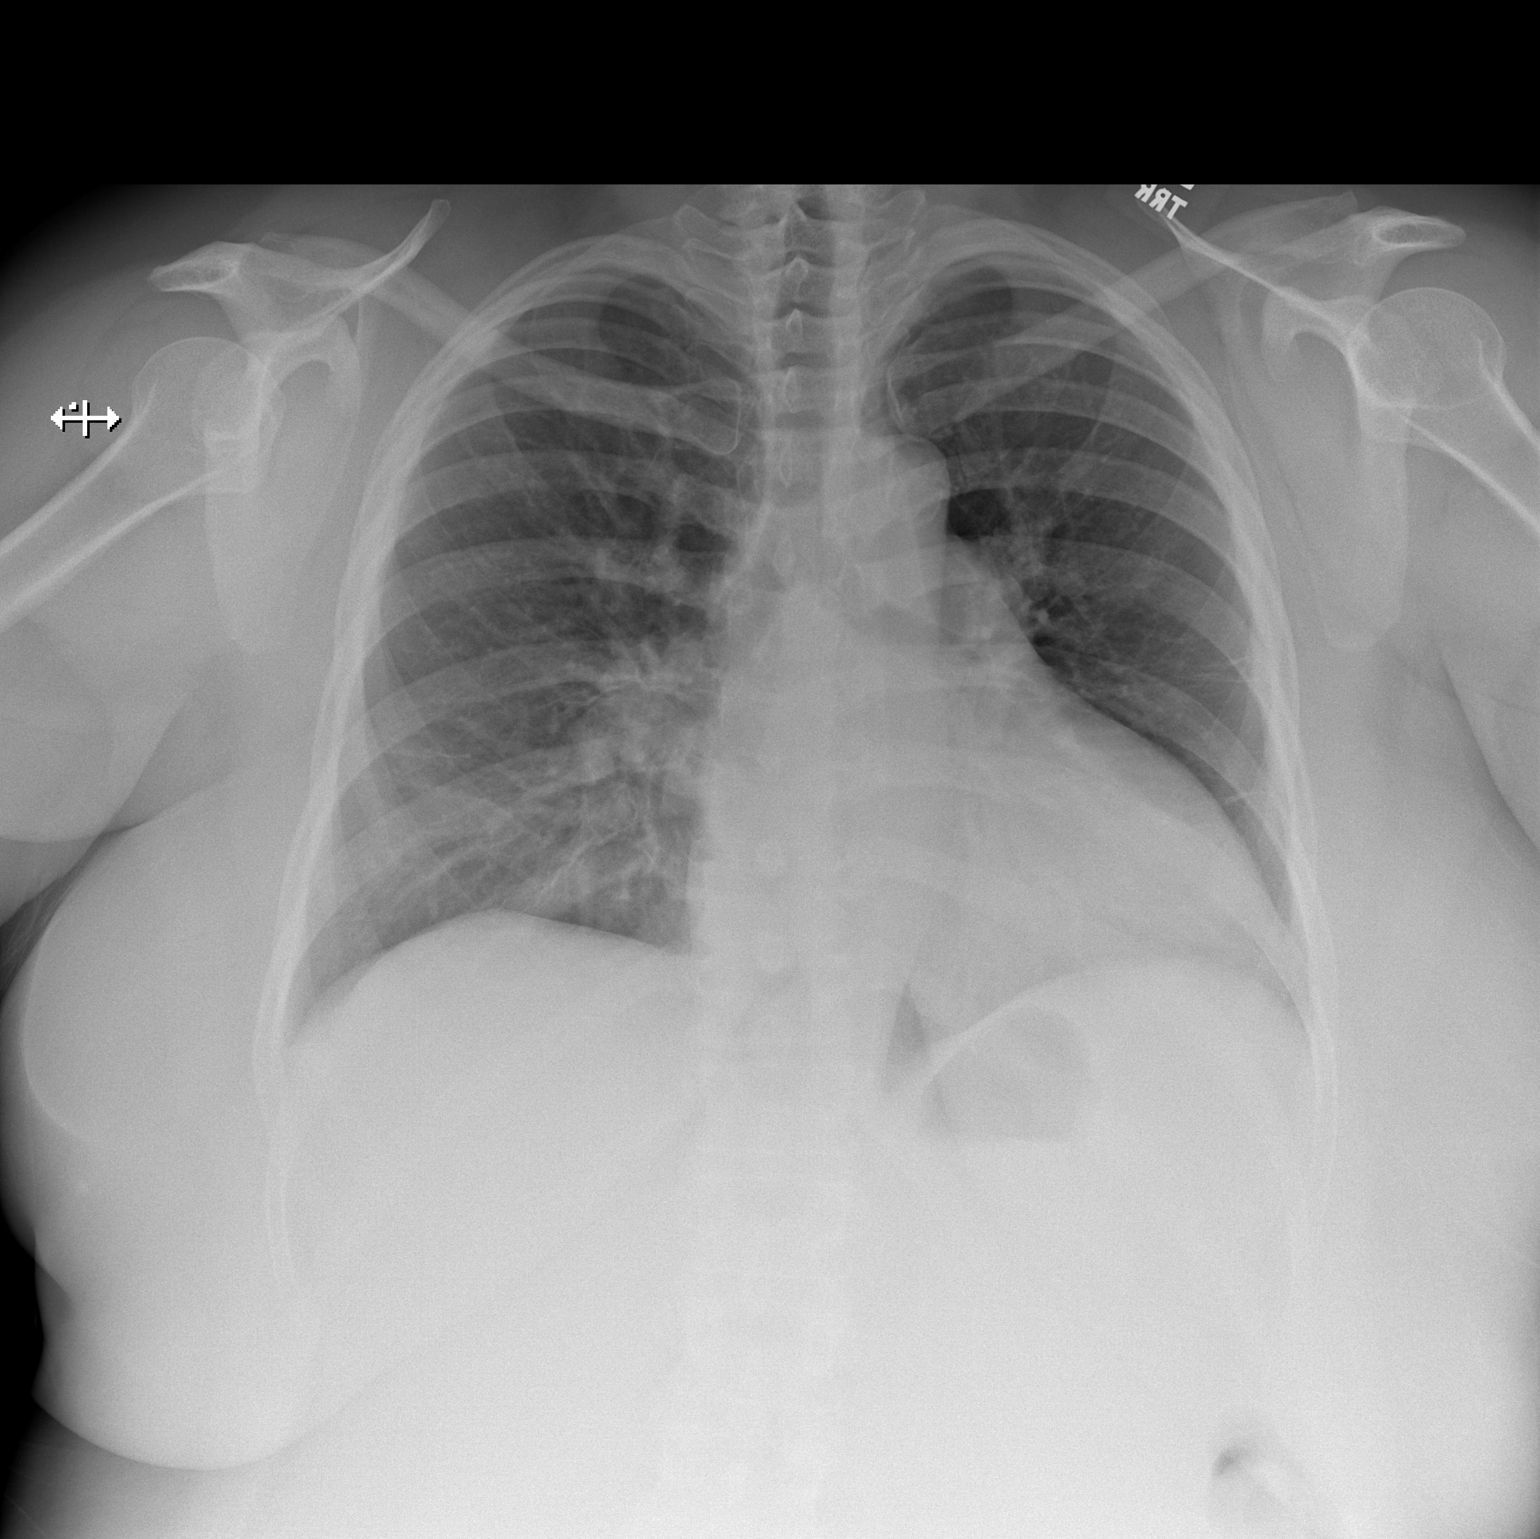
[im 2/2]
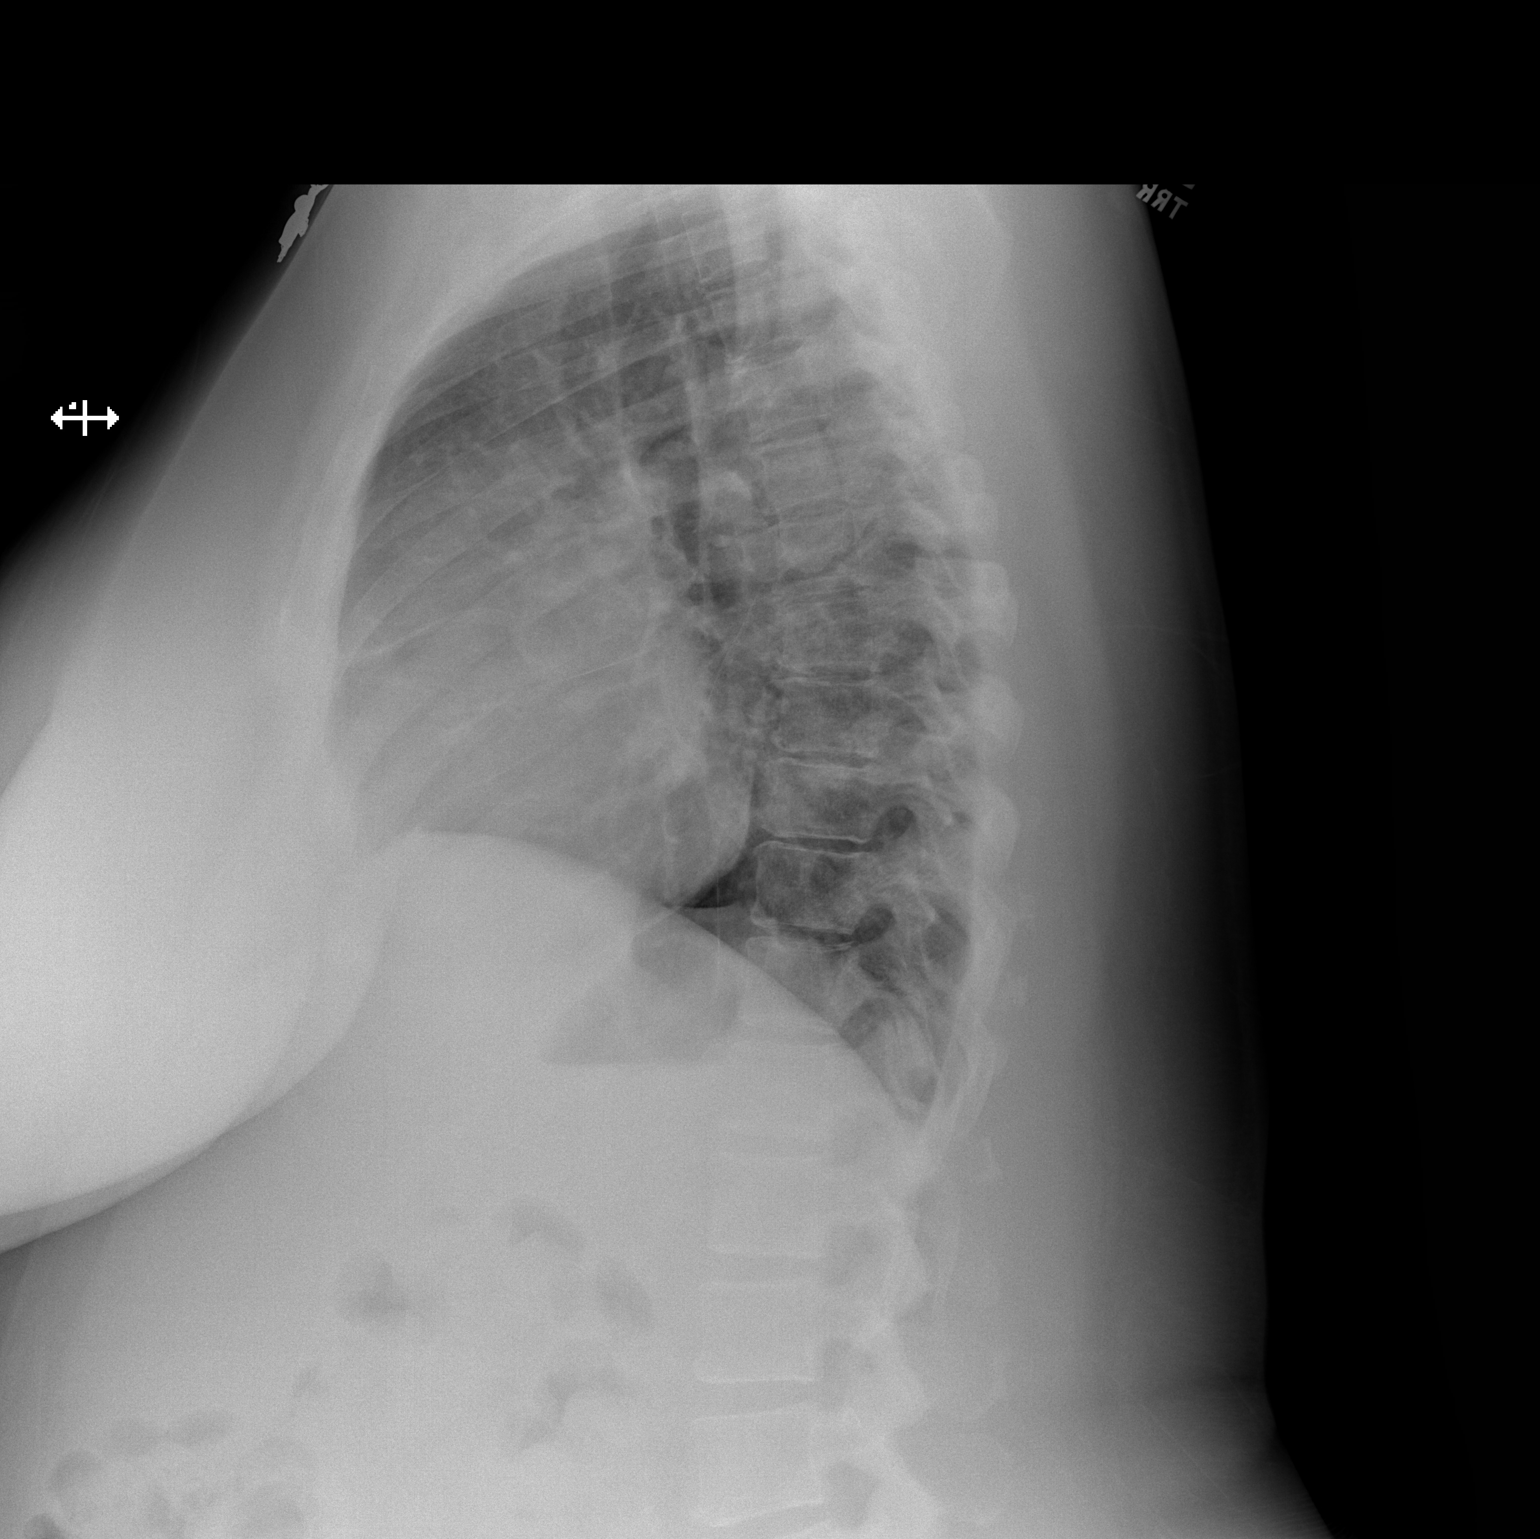

[2 of 2 positions shown; findings below may reference images not displayed]

PROCEDURE:     DXR - DXR CHEST PA (OR AP) AND LATERAL  - [DATE]  [DATE]

RESULT:     Comparison is made to the prior exam of [DATE]. The lung
fields are clear. No pneumonia, pneumothorax or pleural effusion is seen.
The heart is upper limits for normal in size or mildly enlarged but stable
in appearance as compared to the prior exam. No acute bony abnormalities are
seen.
IMPRESSION: 1. The lung fields are clear.
2. The heart is upper limits for normal in size or mildly enlarged but
appears stable in size as compared to the prior exam of [DATE].
[DATE]. No definite acute changes are identified.

[REDACTED]

## 2011-10-30 ENCOUNTER — Emergency Department: Payer: Self-pay | Admitting: Internal Medicine

## 2011-10-30 LAB — CBC
HCT: 35.2 % (ref 35.0–47.0)
RBC: 3.88 10*6/uL (ref 3.80–5.20)
RDW: 14.5 % (ref 11.5–14.5)
WBC: 8.2 10*3/uL (ref 3.6–11.0)

## 2011-10-30 LAB — BASIC METABOLIC PANEL
Anion Gap: 7 (ref 7–16)
BUN: 11 mg/dL (ref 7–18)
Calcium, Total: 9 mg/dL (ref 8.5–10.1)
Chloride: 102 mmol/L (ref 98–107)
Co2: 31 mmol/L (ref 21–32)
Creatinine: 0.85 mg/dL (ref 0.60–1.30)
Osmolality: 285 (ref 275–301)
Potassium: 4 mmol/L (ref 3.5–5.1)

## 2011-10-30 LAB — URINALYSIS, COMPLETE
Ketone: NEGATIVE
Ph: 5 (ref 4.5–8.0)
Protein: 100
RBC,UR: <1 /HPF (ref 0–5)
Squamous Epithelial: 21
WBC UR: 1 /HPF (ref 0–5)

## 2011-10-30 LAB — WET PREP, GENITAL

## 2011-11-01 ENCOUNTER — Emergency Department: Payer: Self-pay | Admitting: Emergency Medicine

## 2011-11-01 LAB — COMPREHENSIVE METABOLIC PANEL
Anion Gap: 5 — ABNORMAL LOW (ref 7–16)
BUN: 9 mg/dL (ref 7–18)
Bilirubin,Total: 0.3 mg/dL (ref 0.2–1.0)
Calcium, Total: 9.5 mg/dL (ref 8.5–10.1)
Chloride: 101 mmol/L (ref 98–107)
Co2: 32 mmol/L (ref 21–32)
Creatinine: 0.97 mg/dL (ref 0.60–1.30)
EGFR (African American): >60
Potassium: 3.7 mmol/L (ref 3.5–5.1)
SGOT(AST): 18 U/L (ref 15–37)
SGPT (ALT): 18 U/L
Total Protein: 8.3 g/dL — ABNORMAL HIGH (ref 6.4–8.2)

## 2011-11-01 LAB — URINALYSIS, COMPLETE
Ketone: NEGATIVE
Nitrite: NEGATIVE
Protein: NEGATIVE
RBC,UR: 7 /HPF (ref 0–5)
Specific Gravity: 1.018 (ref 1.003–1.030)
WBC UR: 1 /HPF (ref 0–5)

## 2011-11-01 LAB — LIPASE, BLOOD: Lipase: 1215 U/L — ABNORMAL HIGH (ref 73–393)

## 2011-11-01 LAB — CBC
HGB: 11.5 g/dL — ABNORMAL LOW (ref 12.0–16.0)
MCH: 27.8 pg (ref 26.0–34.0)
MCHC: 30.4 g/dL — ABNORMAL LOW (ref 32.0–36.0)
MCV: 92 fL (ref 80–100)
Platelet: 314 10*3/uL (ref 150–440)
RDW: 14.6 % — ABNORMAL HIGH (ref 11.5–14.5)

## 2011-11-01 IMAGING — US ABDOMEN ULTRASOUND LIMITED
1 series · 14 of 25 positions shown · non-contrast
Comparison: none

REASON FOR EXAM: abd pain with elevate lipase
COMMENTS:   Body Site: GB and Fossa, CBD, Head of Pancreas

[Series 1: abdomen ultrasound limited · 0.28mm/px · 14 of 27 slices shown]
[im 1/27]
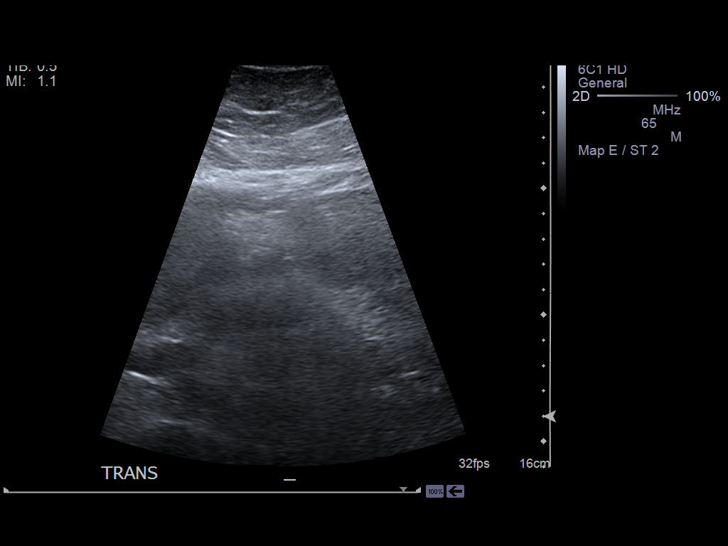
[im 3/27]
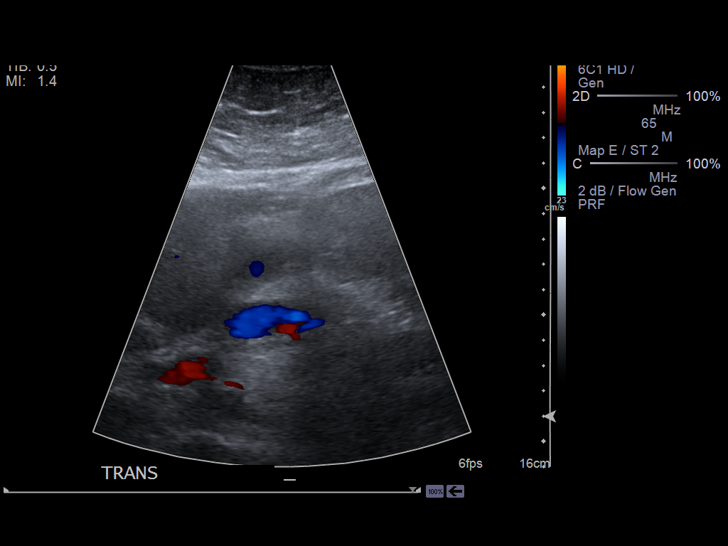
[im 5/27]
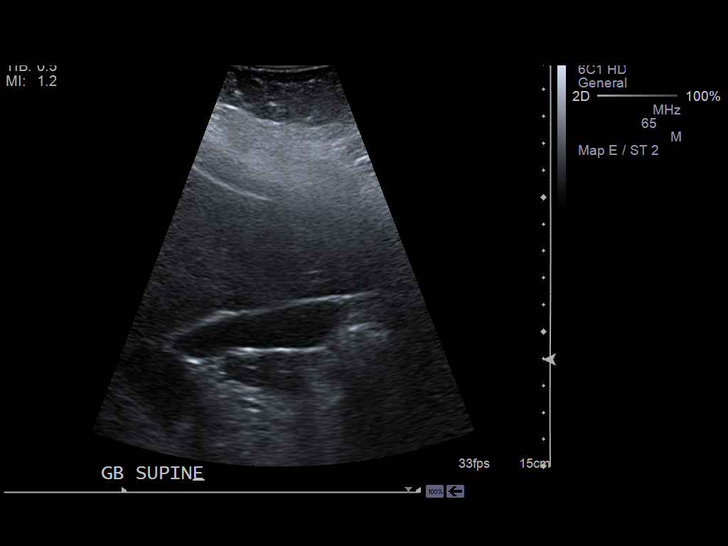
[im 7/27]
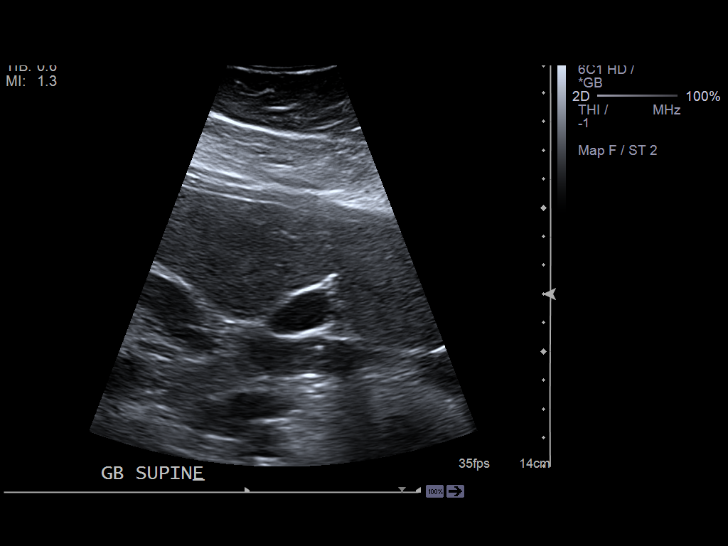
[im 9/27]
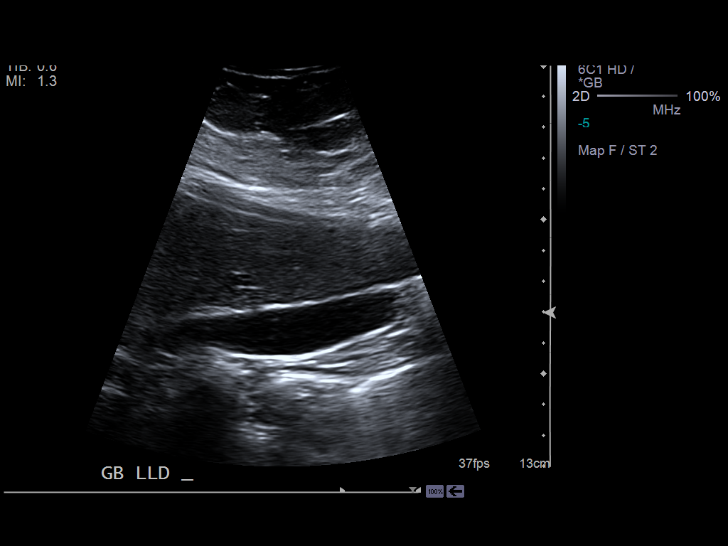
[im 10/27]
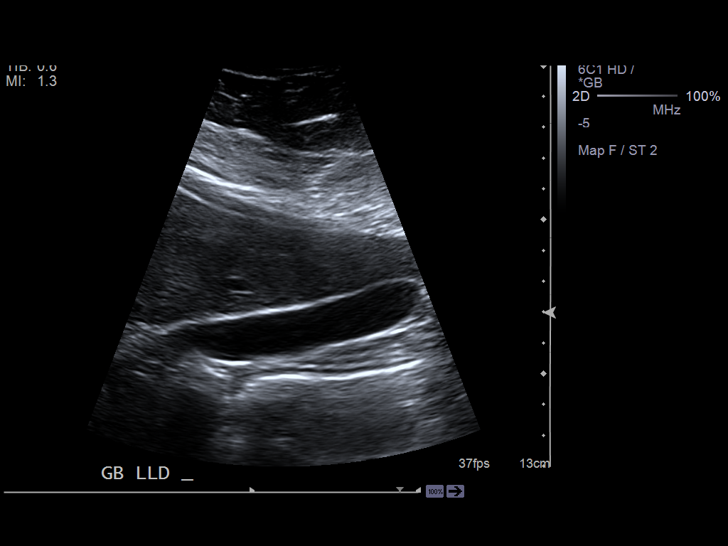
[im 12/27]
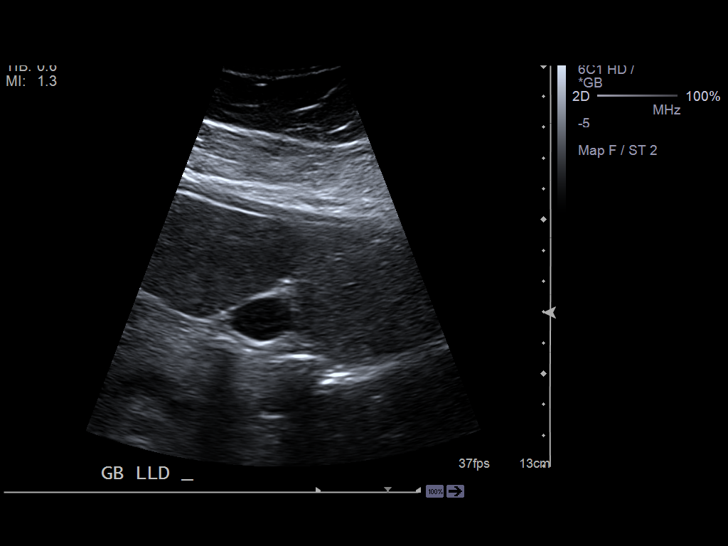
[im 15/27]
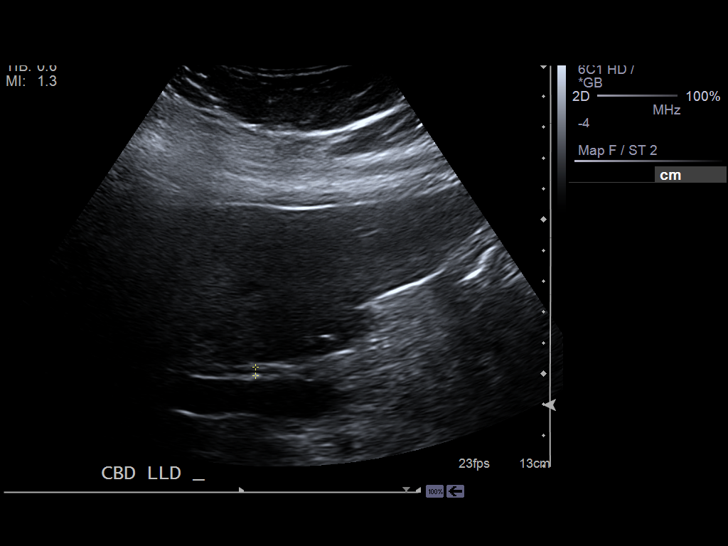
[im 17/27]
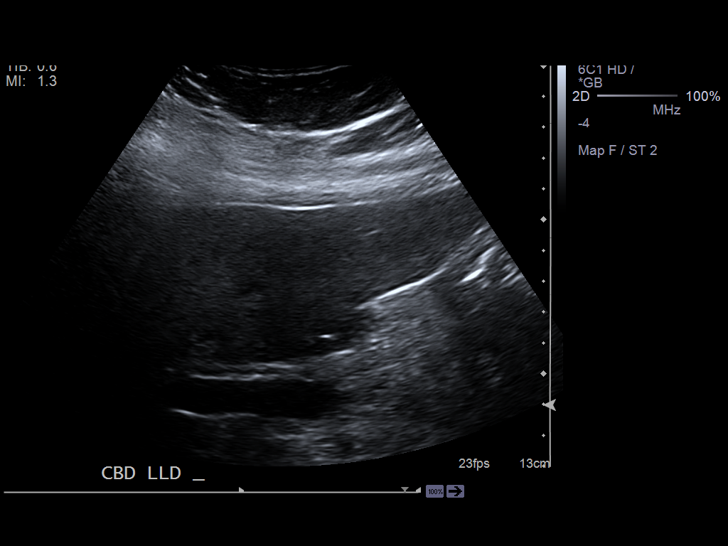
[im 18/27]
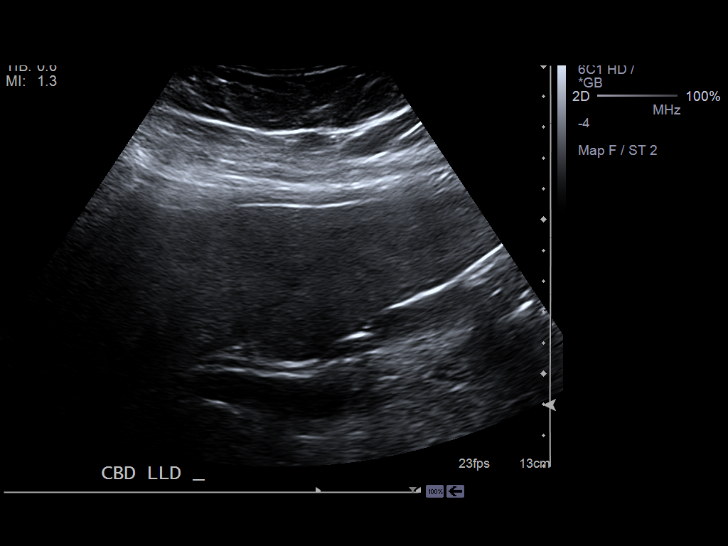
[im 20/27]
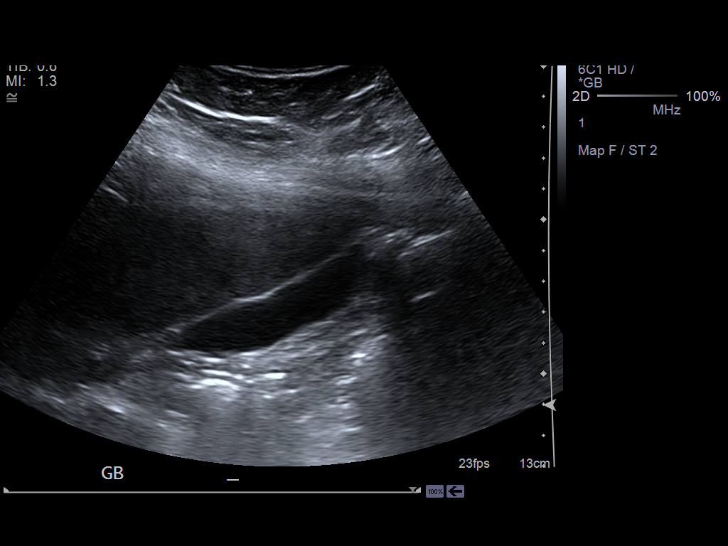
[im 22/27]
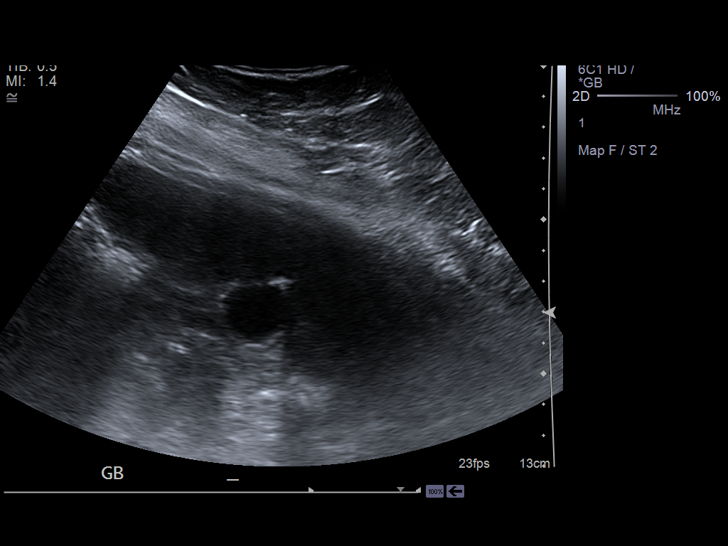
[im 24/27]
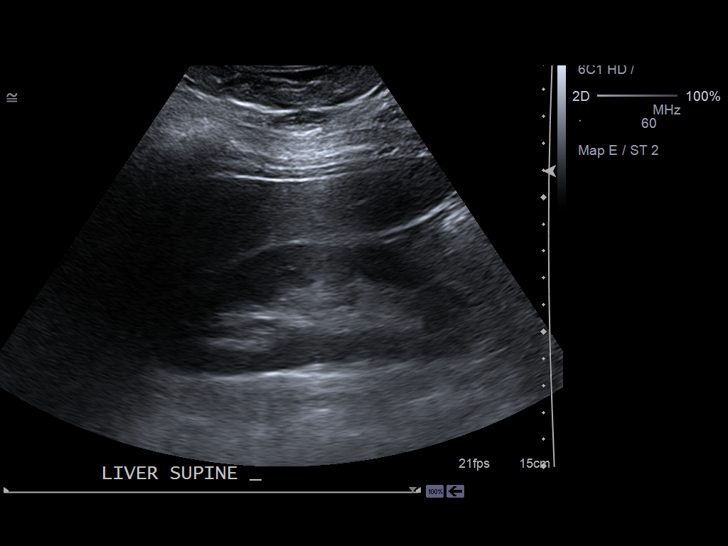
[im 27/27]
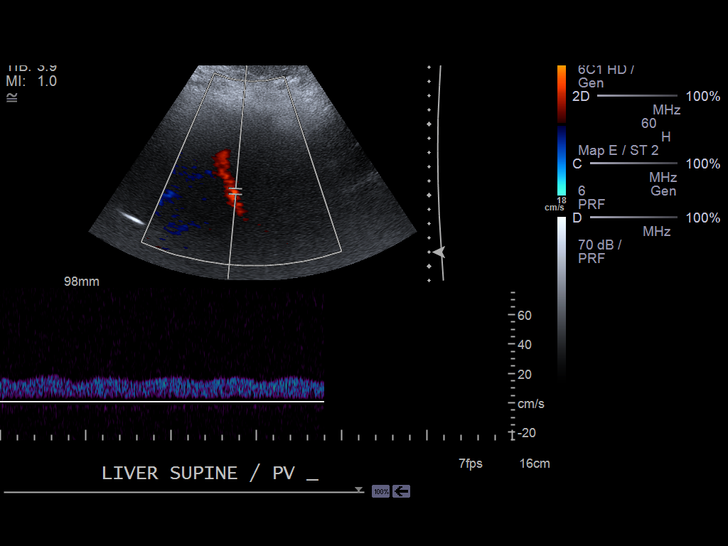

[14 of 25 positions shown; findings below may reference images not displayed]

PROCEDURE:     US  - US ABDOMEN LIMITED SURVEY  - [DATE]  [DATE]

RESULT:     A limited right upper quadrant ultrasound was performed.
Evaluation of the pancreas is limited due to bowel gas. The gallbladder is
adequately distended with no evidence of stones or wall thickening or
pericholecystic fluid. There is no positive sonographic Murphy's sign. The
common bile duct is normal at 3.2 mm in diameter. The observed portions of
the liver exhibit increased echotexture which may indicate fatty
infiltration. Portal venous flow is normal in direction toward the liver.
IMPRESSION: 1. Evaluation of the pancreas was very limited. CT scanning is recommended.
2. There is no evidence of acute gallbladder abnormality. The liver exhibits
somewhat increased echotexture which may reflect fatty infiltration.

## 2011-11-13 ENCOUNTER — Emergency Department: Payer: Self-pay | Admitting: Unknown Physician Specialty

## 2011-11-13 LAB — COMPREHENSIVE METABOLIC PANEL
Albumin: 3.1 g/dL — ABNORMAL LOW (ref 3.4–5.0)
Alkaline Phosphatase: 74 U/L (ref 50–136)
Anion Gap: 6 — ABNORMAL LOW (ref 7–16)
BUN: 12 mg/dL (ref 7–18)
Bilirubin,Total: 0.2 mg/dL (ref 0.2–1.0)
Chloride: 102 mmol/L (ref 98–107)
Co2: 32 mmol/L (ref 21–32)
Creatinine: 0.96 mg/dL (ref 0.60–1.30)
EGFR (African American): >60
Glucose: 240 mg/dL — ABNORMAL HIGH (ref 65–99)
Osmolality: 287 (ref 275–301)
SGOT(AST): 15 U/L (ref 15–37)
SGPT (ALT): 15 U/L (ref 12–78)
Total Protein: 7.3 g/dL (ref 6.4–8.2)

## 2011-11-13 LAB — CBC
HGB: 11 g/dL — ABNORMAL LOW (ref 12.0–16.0)
MCV: 90 fL (ref 80–100)
Platelet: 333 10*3/uL (ref 150–440)
RBC: 3.81 10*6/uL (ref 3.80–5.20)
RDW: 14.9 % — ABNORMAL HIGH (ref 11.5–14.5)
WBC: 8.1 10*3/uL (ref 3.6–11.0)

## 2011-11-13 LAB — URINALYSIS, COMPLETE
Glucose,UR: 50 mg/dL (ref 0–75)
Nitrite: NEGATIVE
Ph: 6 (ref 4.5–8.0)
RBC,UR: 16 /HPF (ref 0–5)
Specific Gravity: 1.019 (ref 1.003–1.030)
Squamous Epithelial: 10

## 2011-11-13 LAB — LIPASE, BLOOD: Lipase: 754 U/L — ABNORMAL HIGH (ref 73–393)

## 2011-11-13 LAB — URIC ACID: Uric Acid: 4.8 mg/dL (ref 2.6–6.0)

## 2011-12-08 LAB — CBC WITH DIFFERENTIAL/PLATELET
Basophil #: 0.1 10*3/uL (ref 0.0–0.1)
Basophil %: 1.3 %
Eosinophil %: 1.7 %
HCT: 36.4 % (ref 35.0–47.0)
HGB: 11.7 g/dL — ABNORMAL LOW (ref 12.0–16.0)
Lymphocyte #: 2.9 10*3/uL (ref 1.0–3.6)
Lymphocyte %: 28.7 %
MCH: 28.9 pg (ref 26.0–34.0)
MCHC: 32.2 g/dL (ref 32.0–36.0)
MCV: 90 fL (ref 80–100)
Neutrophil #: 6.3 10*3/uL (ref 1.4–6.5)
Neutrophil %: 62.6 %
Platelet: 317 10*3/uL (ref 150–440)
RBC: 4.05 10*6/uL (ref 3.80–5.20)
WBC: 10.1 10*3/uL (ref 3.6–11.0)

## 2011-12-08 LAB — BASIC METABOLIC PANEL
Anion Gap: 7 (ref 7–16)
BUN: 12 mg/dL (ref 7–18)
Calcium, Total: 9.3 mg/dL (ref 8.5–10.1)
Co2: 28 mmol/L (ref 21–32)
Creatinine: 0.82 mg/dL (ref 0.60–1.30)
EGFR (African American): >60
Potassium: 3.5 mmol/L (ref 3.5–5.1)
Sodium: 138 mmol/L (ref 136–145)

## 2011-12-08 IMAGING — CT CT HEAD WITHOUT CONTRAST
2 series · 16 of 30 positions shown, 20 images · non-contrast
Comparison: none

REASON FOR EXAM: mental status change
COMMENTS:

PROCEDURE:     CT  - CT HEAD WITHOUT CONTRAST  - [DATE] [DATE]
RESULT:     Technique: Helical 5mm sections were obtained from the skull
base to the vertex without administration of intravenous contrast.

[Series 2: without · axial · non-contrast · 0.41mm/px · z∈[-159,-39]mm · 13 of 29 slices shown, 17 images]
[im 3/29  brain]
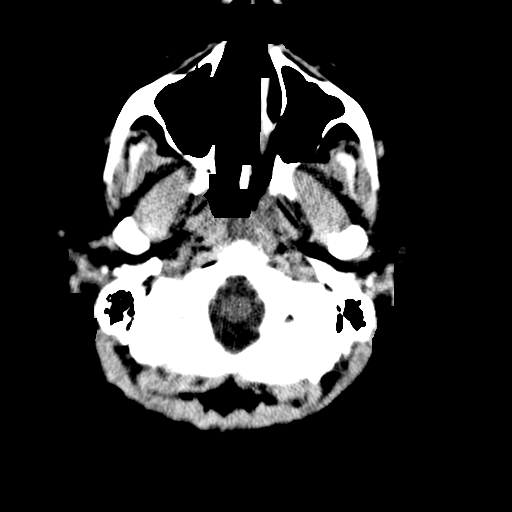
[im 3/29  bone]
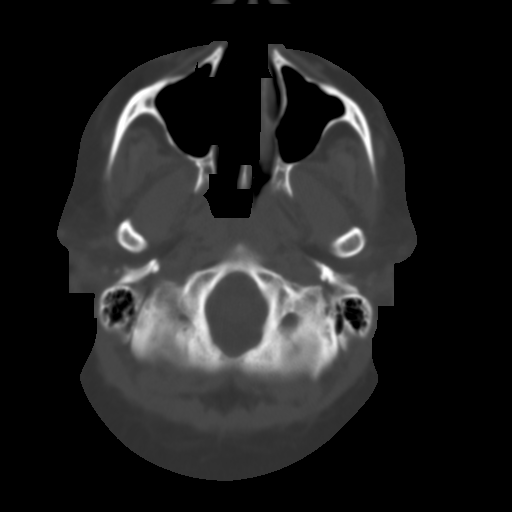
[im 5/29  brain]
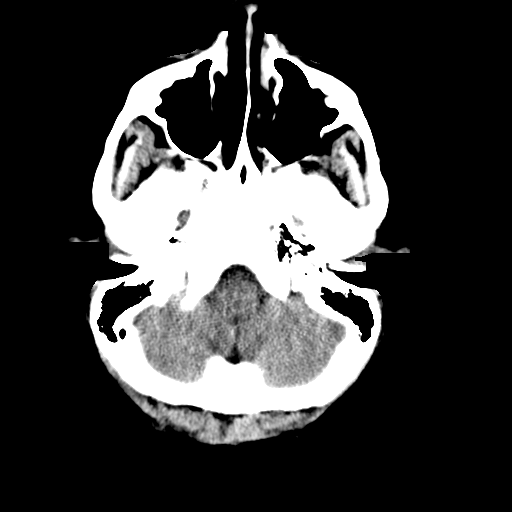
[im 7/29  brain]
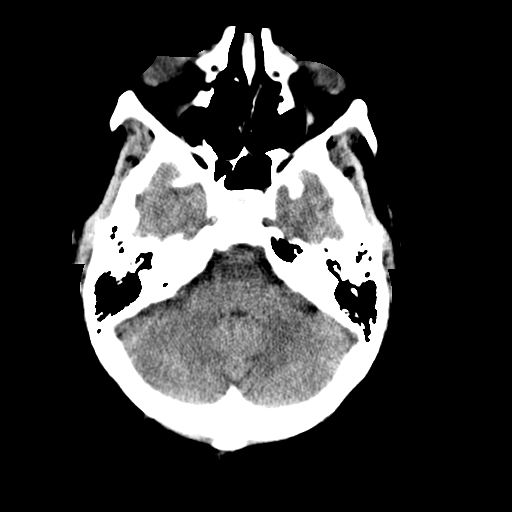
[im 9/29  brain]
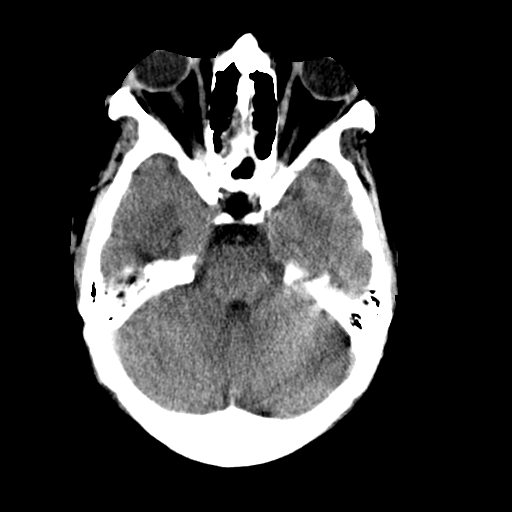
[im 11/29  brain]
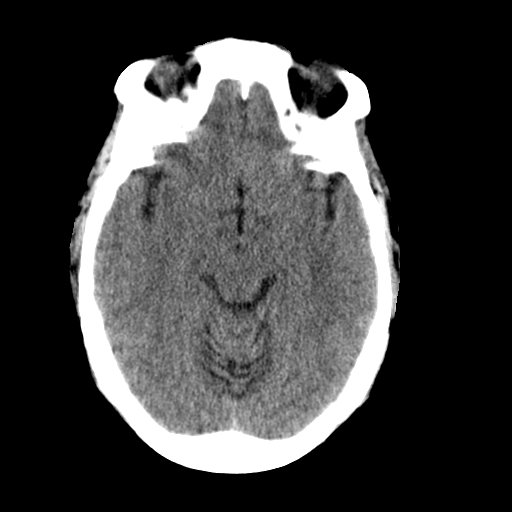
[im 11/29  bone]
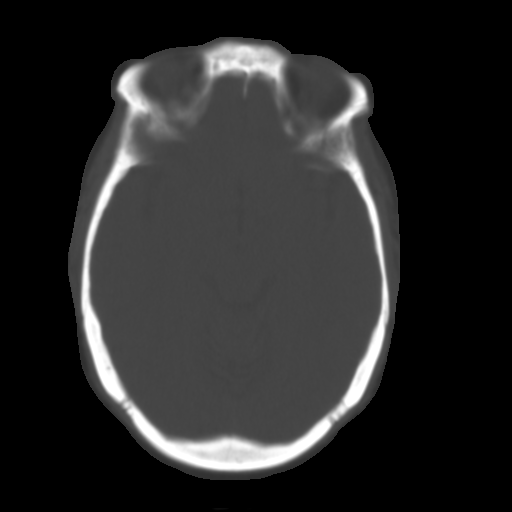
[im 13/29  brain]
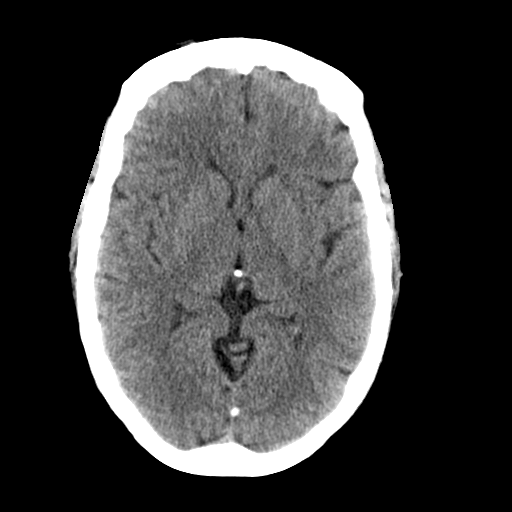
[im 15/29  brain]
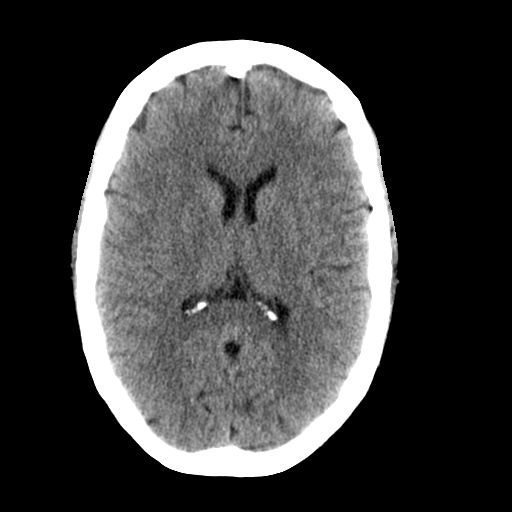
[im 17/29  brain]
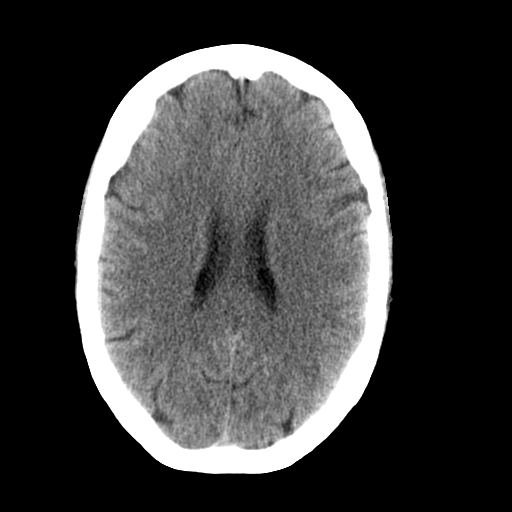
[im 19/29  brain]
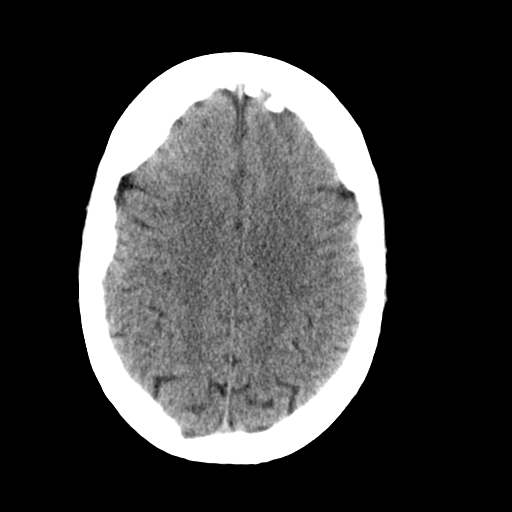
[im 19/29  bone]
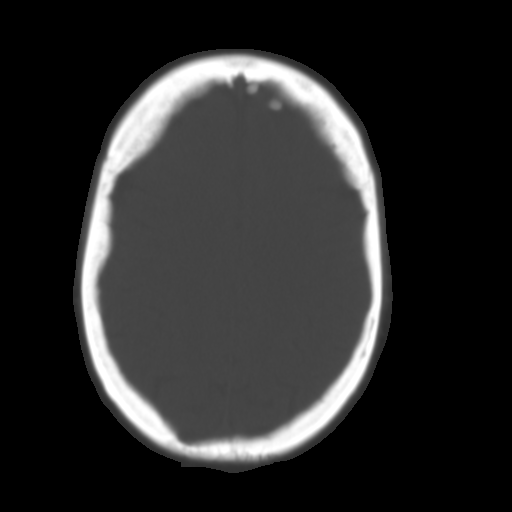
[im 21/29  brain]
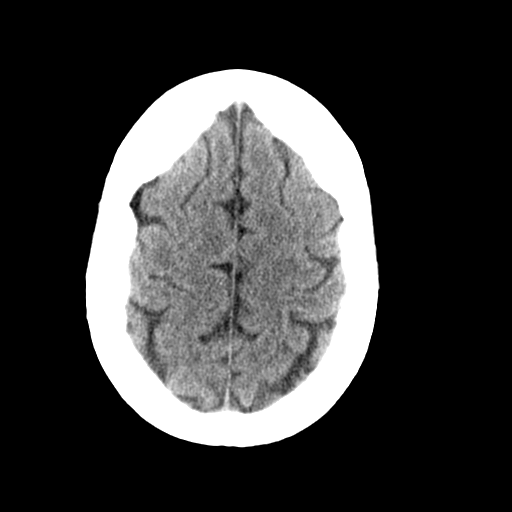
[im 23/29  brain]
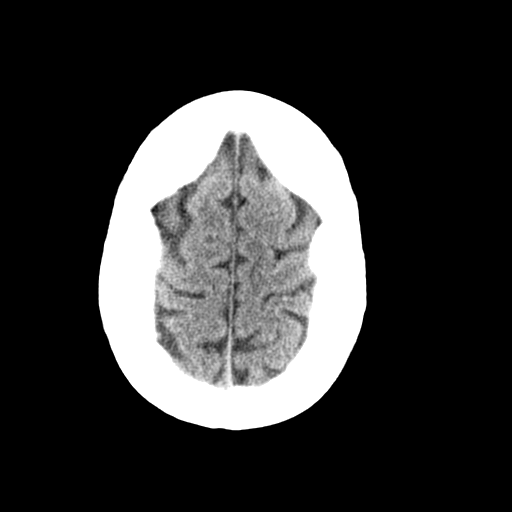
[im 25/29  brain]
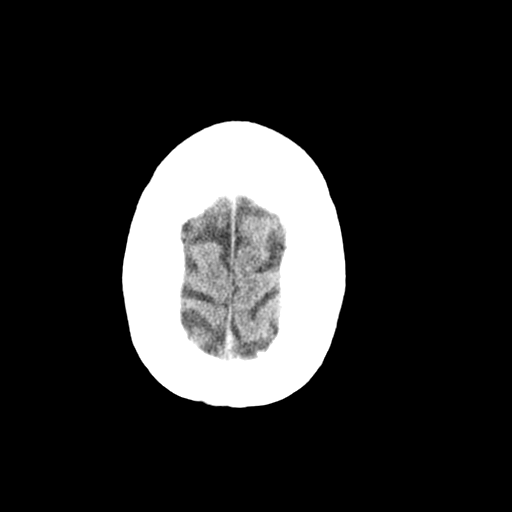
[im 27/29  brain]
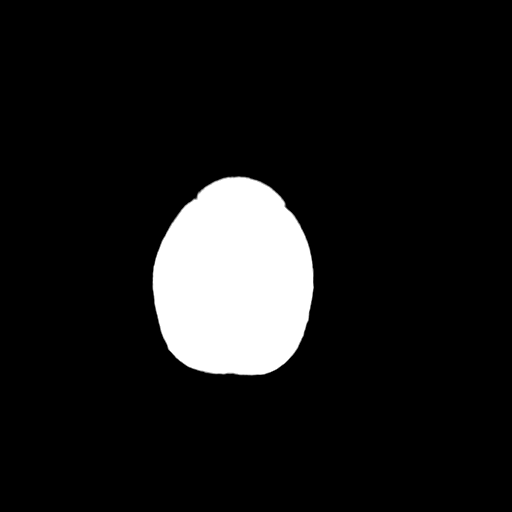
[im 27/29  bone]
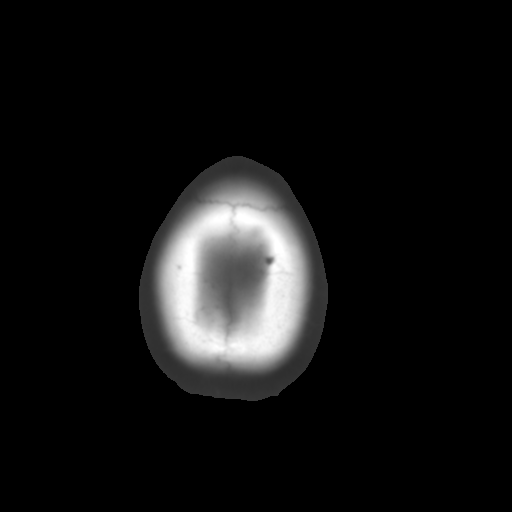

[Series 3: bone · axial · 0.41mm/px · z∈[-159,-119]mm · 3 of 29 slices shown]
[im 3/29  bone]
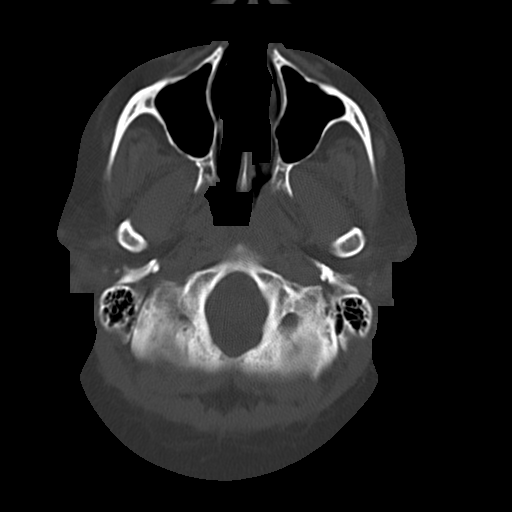
[im 7/29  bone]
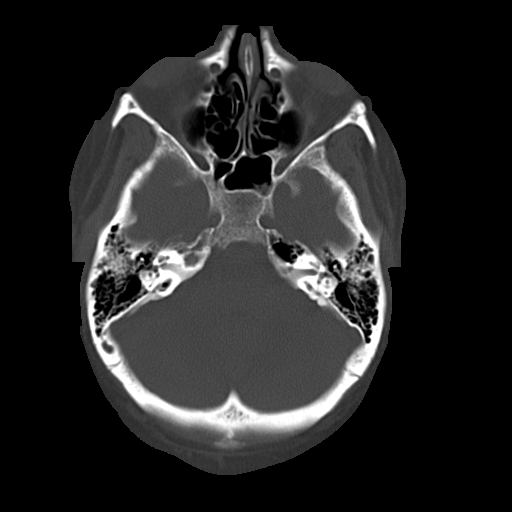
[im 11/29  bone]
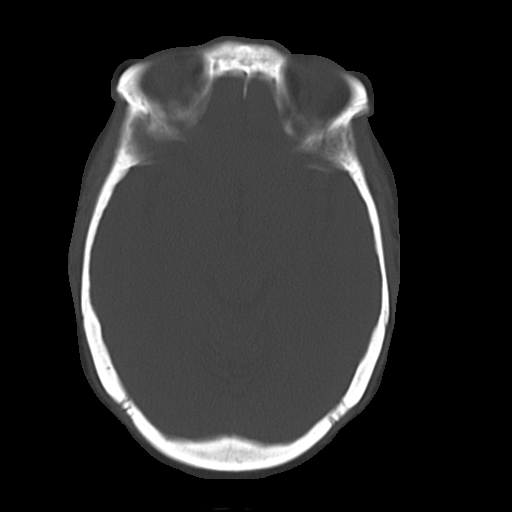

[16 of 30 positions shown; findings below may reference images not displayed]

FINDINGS: There is not evidence of intra-axial fluid collections. There is
no evidence of acute hemorrhage or secondary signs reflecting mass effect or
subacute or chronic focal territorial infarction. The osseous structures
demonstrate no evidence of a depressed skull fracture. If there is
persistent concern clinical follow-up with MRI is recommended.
IMPRESSION: 1. No evidence of acute intracranial abnormalities.
2. A preliminary faxed report was relayed on [DATE].

## 2011-12-08 IMAGING — CR DG CHEST 2V
1 series · 3 of 3 positions shown · non-contrast
Comparison: none

REASON FOR EXAM: chest pain
COMMENTS:

[Series 1: ap · 0.17mm/px · 3 of 3 slices shown]
[im 1/3]
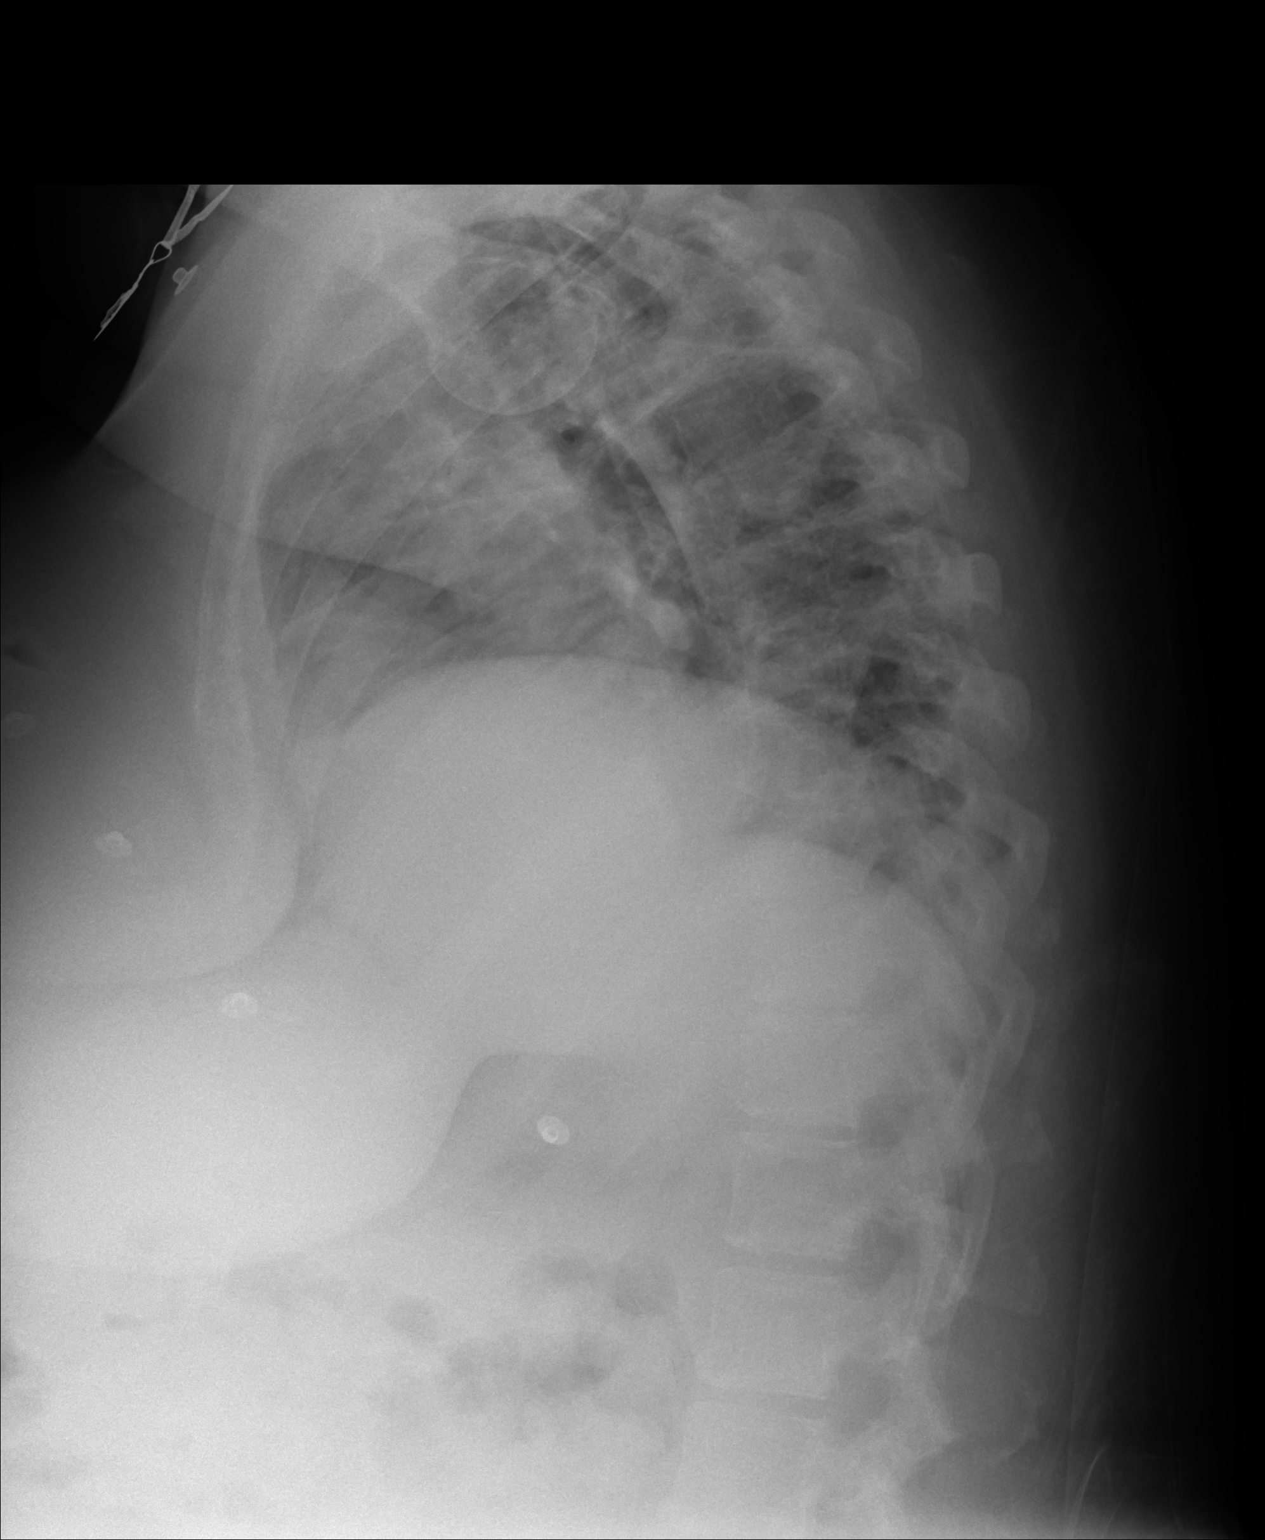
[im 2/3]
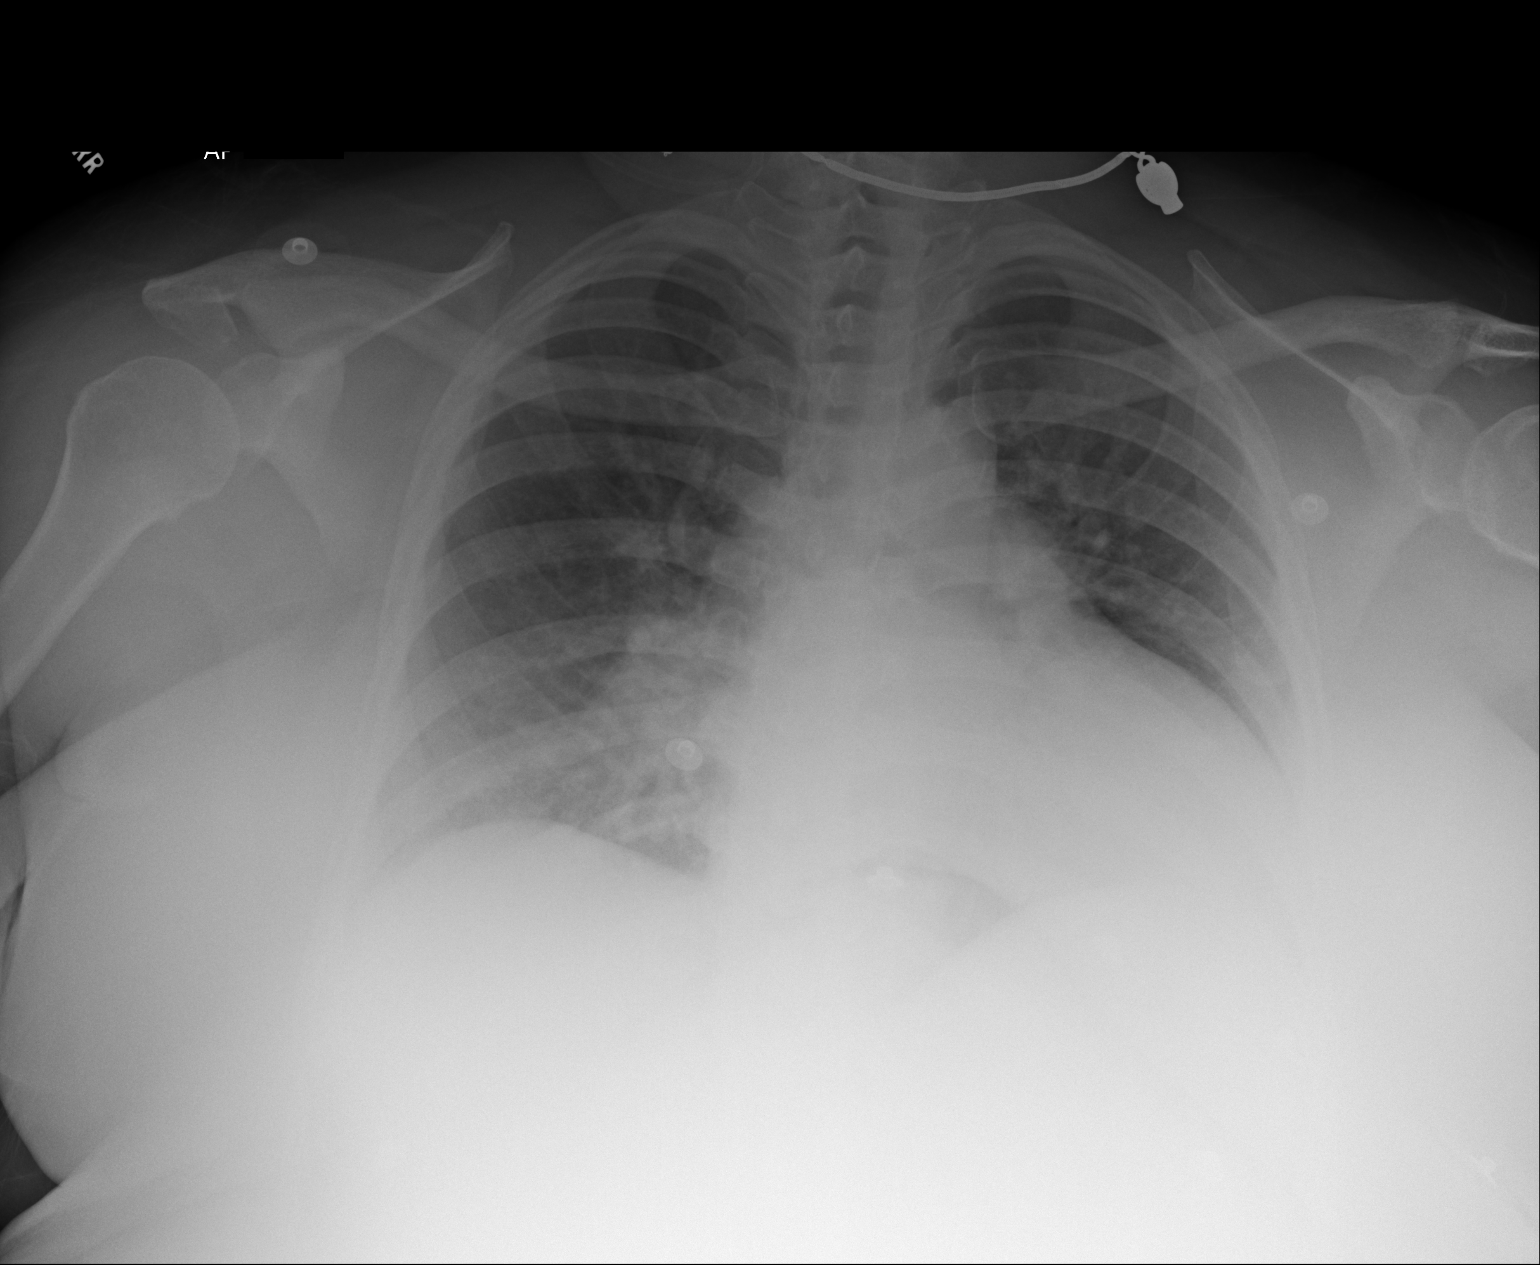
[im 3/3]
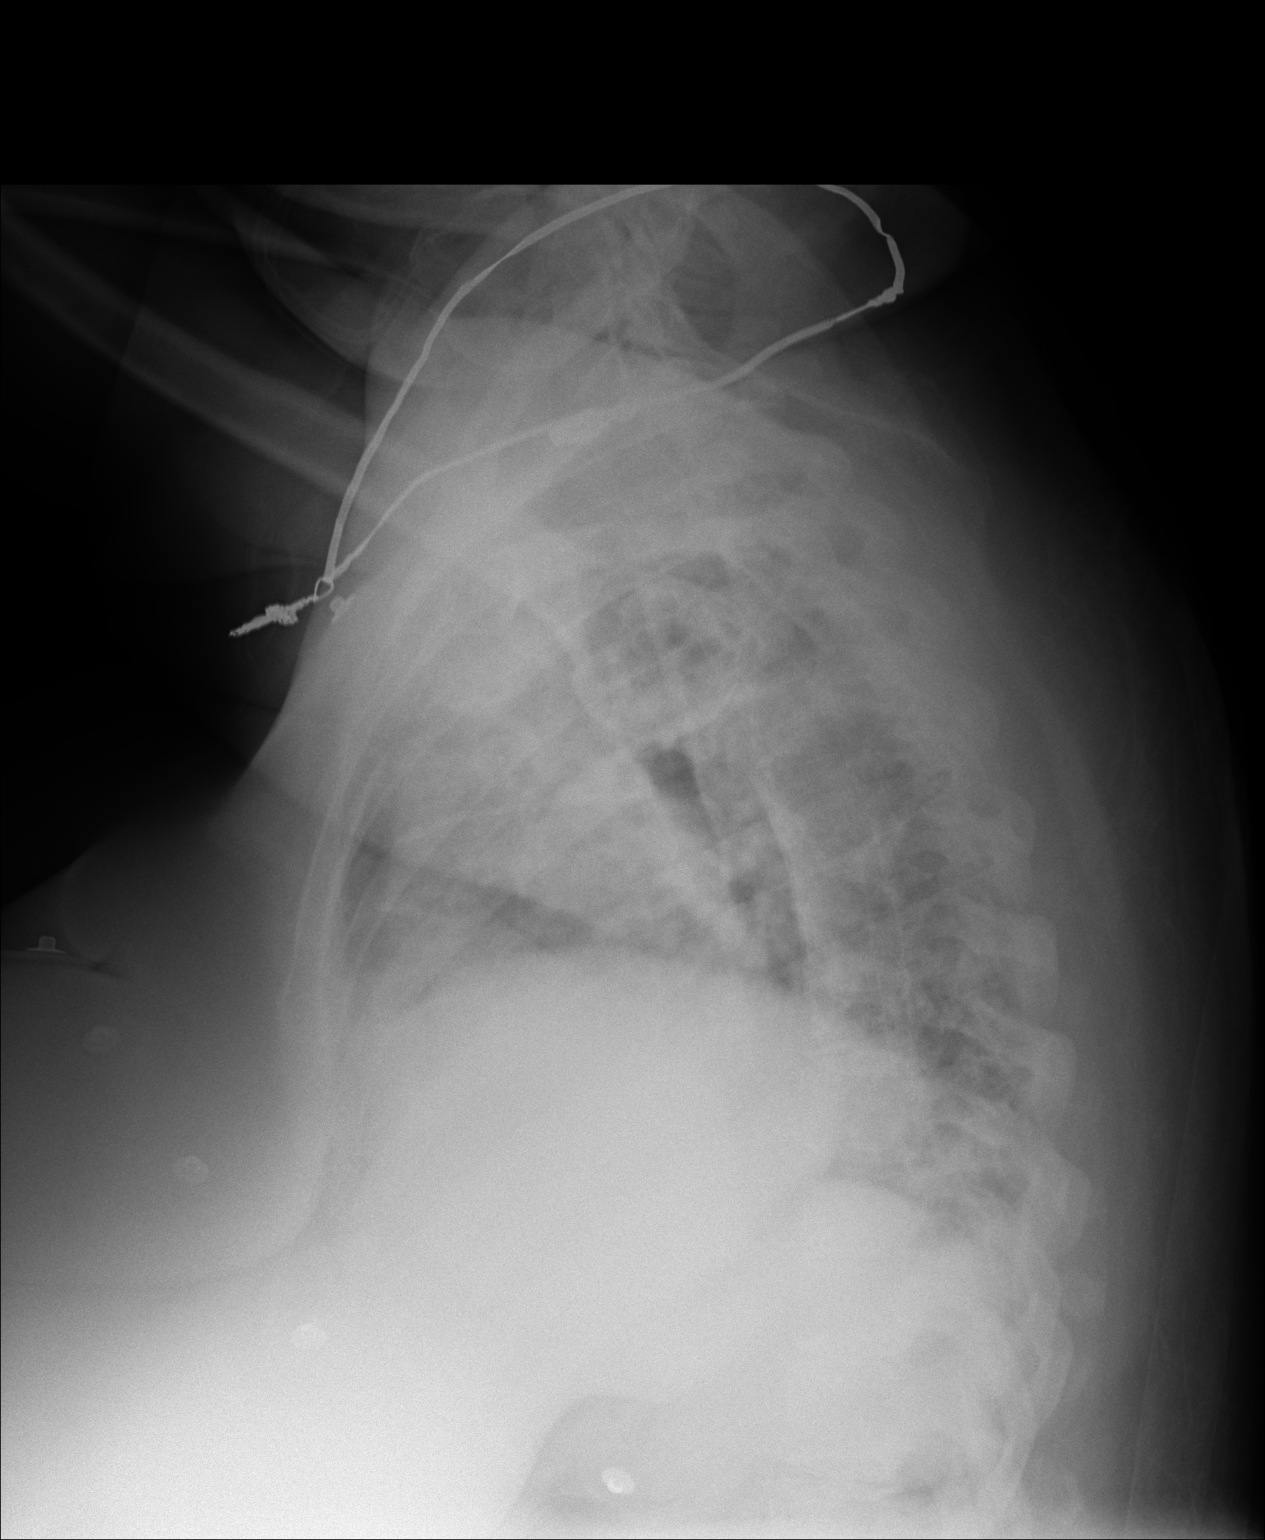

[3 of 3 positions shown; findings below may reference images not displayed]

PROCEDURE:     DXR - DXR CHEST PA (OR AP) AND LATERAL  - [DATE] [DATE]

RESULT:     Comparison is made to the study [DATE].

Cardiac silhouette is enlarged. There is increased density at the right lung
base some of which could be because of attenuation of overlying soft tissue.
The possibility of some minimal right middle lobe medial segment infiltrate
is not excluded. There is very shallow inspiration on the lateral view. No
overt edema is seen. No significant effusion is evident.
IMPRESSION: 1. Cardiomegaly.
2. Right lung base increased density concerning for underlying infiltrate.
Correlate clinically.

[REDACTED]

## 2011-12-09 ENCOUNTER — Inpatient Hospital Stay: Payer: Self-pay | Admitting: Family Medicine

## 2011-12-09 LAB — AMMONIA: Ammonia, Plasma: 42 mcmol/L — ABNORMAL HIGH (ref 11–32)

## 2011-12-09 LAB — URINALYSIS, COMPLETE
Bilirubin,UR: NEGATIVE
Ketone: NEGATIVE
Ph: 5 (ref 4.5–8.0)
Protein: NEGATIVE
RBC,UR: 32 /HPF (ref 0–5)
Specific Gravity: 1.01 (ref 1.003–1.030)
Squamous Epithelial: 3

## 2011-12-09 LAB — DRUG SCREEN, URINE
Amphetamines, Ur Screen: NEGATIVE (ref ?–1000)
Barbiturates, Ur Screen: NEGATIVE (ref ?–200)
Benzodiazepine, Ur Scrn: POSITIVE (ref ?–200)
Methadone, Ur Screen: NEGATIVE (ref ?–300)
Phencyclidine (PCP) Ur S: NEGATIVE (ref ?–25)
Tricyclic, Ur Screen: NEGATIVE (ref ?–1000)

## 2011-12-09 LAB — HEPATIC FUNCTION PANEL A (ARMC)
Albumin: 3.2 g/dL — ABNORMAL LOW (ref 3.4–5.0)
Alkaline Phosphatase: 80 U/L (ref 50–136)
SGOT(AST): 18 U/L (ref 15–37)
SGPT (ALT): 13 U/L (ref 12–78)
Total Protein: 6.8 g/dL (ref 6.4–8.2)

## 2011-12-09 LAB — TROPONIN I
Troponin-I: <0.02 ng/mL
Troponin-I: <0.02 ng/mL

## 2011-12-09 LAB — CK TOTAL AND CKMB (NOT AT ARMC)
CK, Total: 75 U/L (ref 21–215)
CK, Total: 52 U/L (ref 21–215)
CK-MB: <0.5 ng/mL — ABNORMAL LOW (ref 0.5–3.6)

## 2011-12-09 LAB — ETHANOL: Ethanol: <3 mg/dL

## 2011-12-09 IMAGING — CR DG CHEST 1V PORT
1 series · 1 of 1 positions shown · non-contrast
Comparison: none

REASON FOR EXAM: hypoxia
COMMENTS:

[portable]
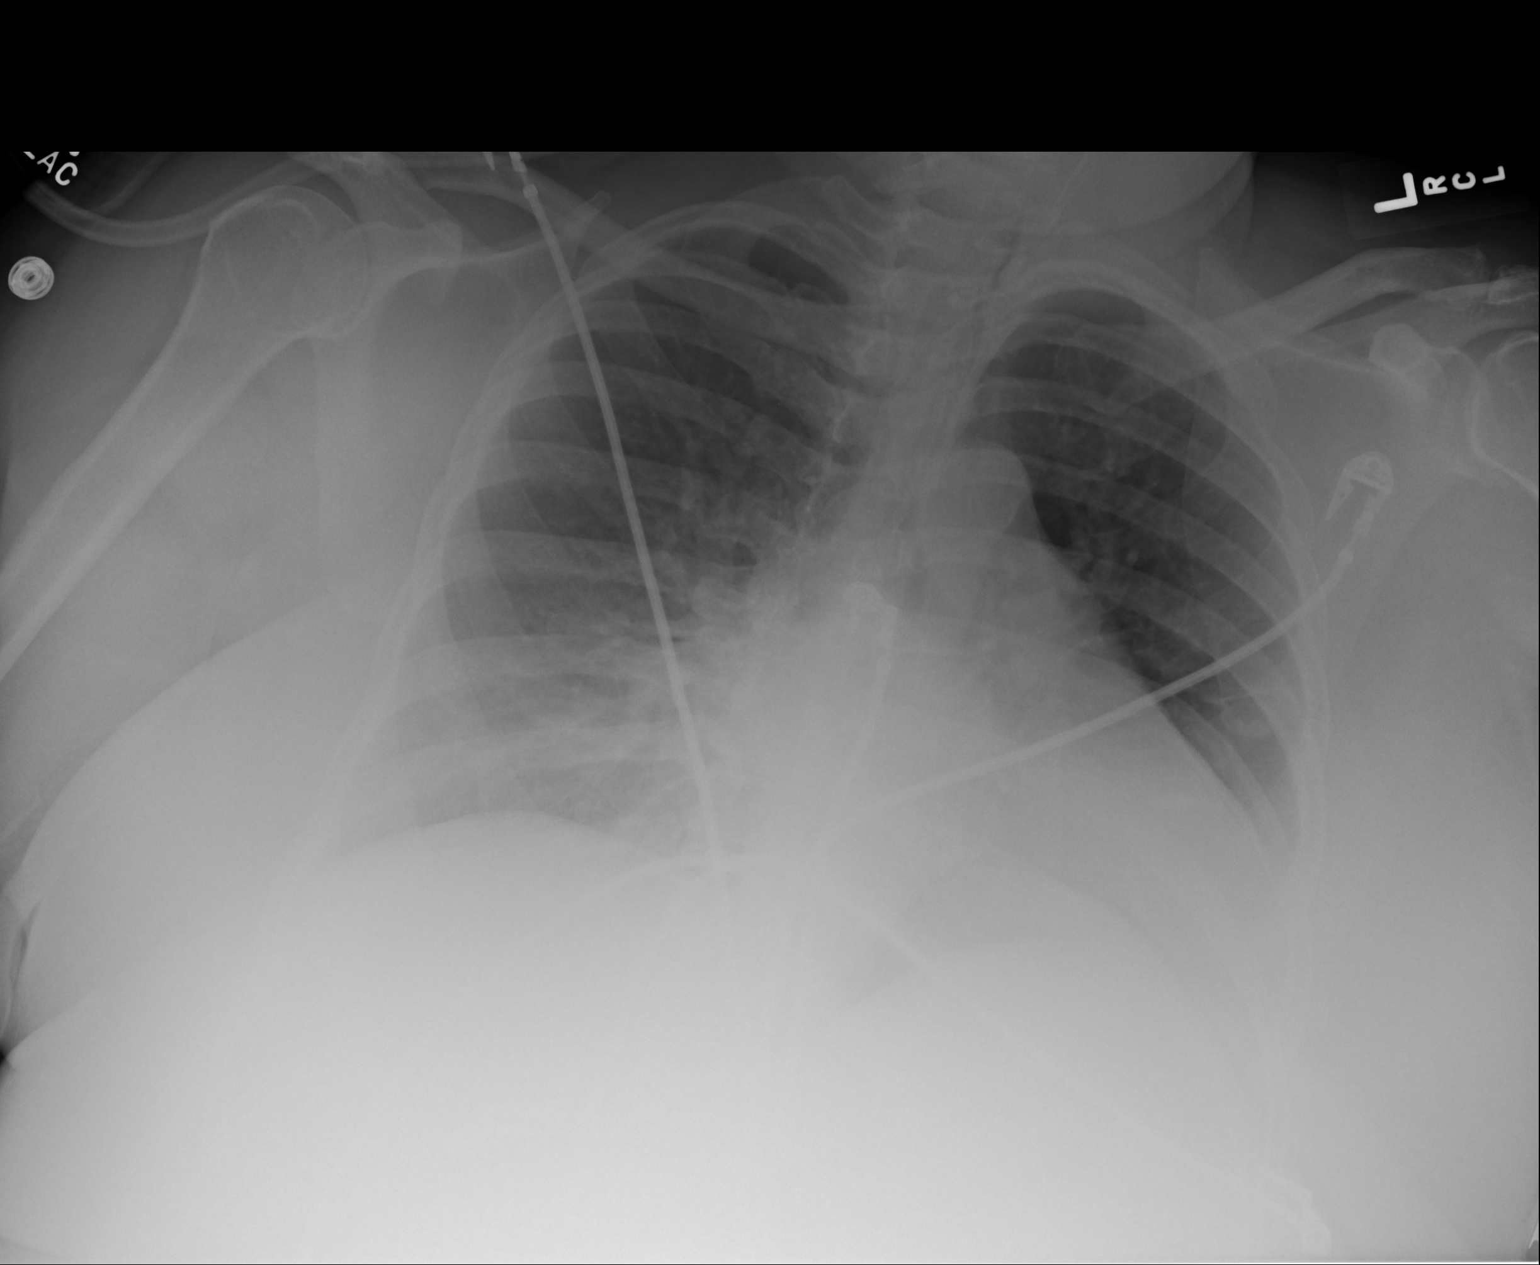

[1 of 1 positions shown; findings below may reference images not displayed]

PROCEDURE:     DXR - DXR PORTABLE CHEST SINGLE VIEW  - [DATE]  [DATE]

RESULT:     Comparison is made to the images [DATE] at [DATE] p.m.
There is persistent shallow inspiratory effort with prominence of the
cardiac silhouette and increased density at the right lung base. Followup PA
and lateral views would be helpful in the patient's condition permits. This
should be done in the [HOSPITAL].
IMPRESSION: 1. Cardiomegaly with right lung base increased density which could represent
atelectasis or infiltrate.

[REDACTED]

## 2011-12-10 LAB — CBC WITH DIFFERENTIAL/PLATELET
Eosinophil #: 0.2 10*3/uL (ref 0.0–0.7)
Lymphocyte #: 2.2 10*3/uL (ref 1.0–3.6)
Lymphocyte %: 27.2 %
MCH: 28.8 pg (ref 26.0–34.0)
MCHC: 32.4 g/dL (ref 32.0–36.0)
Monocyte #: 0.5 x10 3/mm (ref 0.2–0.9)
Neutrophil %: 63.5 %
Platelet: 296 10*3/uL (ref 150–440)
WBC: 8.2 10*3/uL (ref 3.6–11.0)

## 2011-12-10 LAB — BASIC METABOLIC PANEL
Anion Gap: 6 — ABNORMAL LOW (ref 7–16)
BUN: 13 mg/dL (ref 7–18)
Co2: 30 mmol/L (ref 21–32)
Creatinine: 0.91 mg/dL (ref 0.60–1.30)
EGFR (African American): >60
Potassium: 4.1 mmol/L (ref 3.5–5.1)
Sodium: 139 mmol/L (ref 136–145)

## 2011-12-10 LAB — URINE CULTURE

## 2011-12-14 LAB — CULTURE, BLOOD (SINGLE)

## 2012-04-10 ENCOUNTER — Emergency Department: Payer: Self-pay | Admitting: Emergency Medicine

## 2012-04-10 LAB — URINALYSIS, COMPLETE
Bacteria: NONE SEEN
Bilirubin,UR: NEGATIVE
Glucose,UR: NEGATIVE mg/dL (ref 0–75)
Ketone: NEGATIVE
Ph: 5 (ref 4.5–8.0)
Protein: 30
RBC,UR: 3 /HPF (ref 0–5)
Squamous Epithelial: 16
WBC UR: 9 /HPF (ref 0–5)

## 2012-04-10 LAB — CBC
HCT: 39.1 % (ref 35.0–47.0)
HGB: 12.6 g/dL (ref 12.0–16.0)
MCH: 28.8 pg (ref 26.0–34.0)
MCHC: 32.3 g/dL (ref 32.0–36.0)
MCV: 89 fL (ref 80–100)
Platelet: 339 10*3/uL (ref 150–440)
RBC: 4.39 10*6/uL (ref 3.80–5.20)

## 2012-04-10 LAB — COMPREHENSIVE METABOLIC PANEL
Albumin: 3.9 g/dL (ref 3.4–5.0)
Anion Gap: 8 (ref 7–16)
BUN: 12 mg/dL (ref 7–18)
Bilirubin,Total: 0.2 mg/dL (ref 0.2–1.0)
Chloride: 105 mmol/L (ref 98–107)
Creatinine: 0.81 mg/dL (ref 0.60–1.30)
Potassium: 3.9 mmol/L (ref 3.5–5.1)
SGOT(AST): 19 U/L (ref 15–37)
Sodium: 140 mmol/L (ref 136–145)
Total Protein: 8.5 g/dL — ABNORMAL HIGH (ref 6.4–8.2)

## 2012-04-10 LAB — LIPASE, BLOOD: Lipase: 249 U/L (ref 73–393)

## 2012-04-17 ENCOUNTER — Inpatient Hospital Stay: Payer: Self-pay | Admitting: Student

## 2012-04-17 LAB — COMPREHENSIVE METABOLIC PANEL
Albumin: 3 g/dL — ABNORMAL LOW (ref 3.4–5.0)
Alkaline Phosphatase: 98 U/L (ref 50–136)
Calcium, Total: 8.3 mg/dL — ABNORMAL LOW (ref 8.5–10.1)
Chloride: 99 mmol/L (ref 98–107)
Osmolality: 273 (ref 275–301)
Potassium: 3.6 mmol/L (ref 3.5–5.1)
SGOT(AST): 32 U/L (ref 15–37)
Sodium: 134 mmol/L — ABNORMAL LOW (ref 136–145)
Total Protein: 7.6 g/dL (ref 6.4–8.2)

## 2012-04-17 LAB — TROPONIN I: Troponin-I: <0.02 ng/mL

## 2012-04-17 LAB — CBC
HGB: 10.7 g/dL — ABNORMAL LOW (ref 12.0–16.0)
MCH: 28.1 pg (ref 26.0–34.0)
MCHC: 31.4 g/dL — ABNORMAL LOW (ref 32.0–36.0)
RBC: 3.81 10*6/uL (ref 3.80–5.20)
WBC: 14.8 10*3/uL — ABNORMAL HIGH (ref 3.6–11.0)

## 2012-04-17 LAB — CK TOTAL AND CKMB (NOT AT ARMC): CK, Total: 127 U/L (ref 21–215)

## 2012-04-17 IMAGING — CR DG CHEST 1V PORT
1 series · 1 of 1 positions shown · non-contrast
Comparison: none

REASON FOR EXAM: sob
COMMENTS:

[ap]
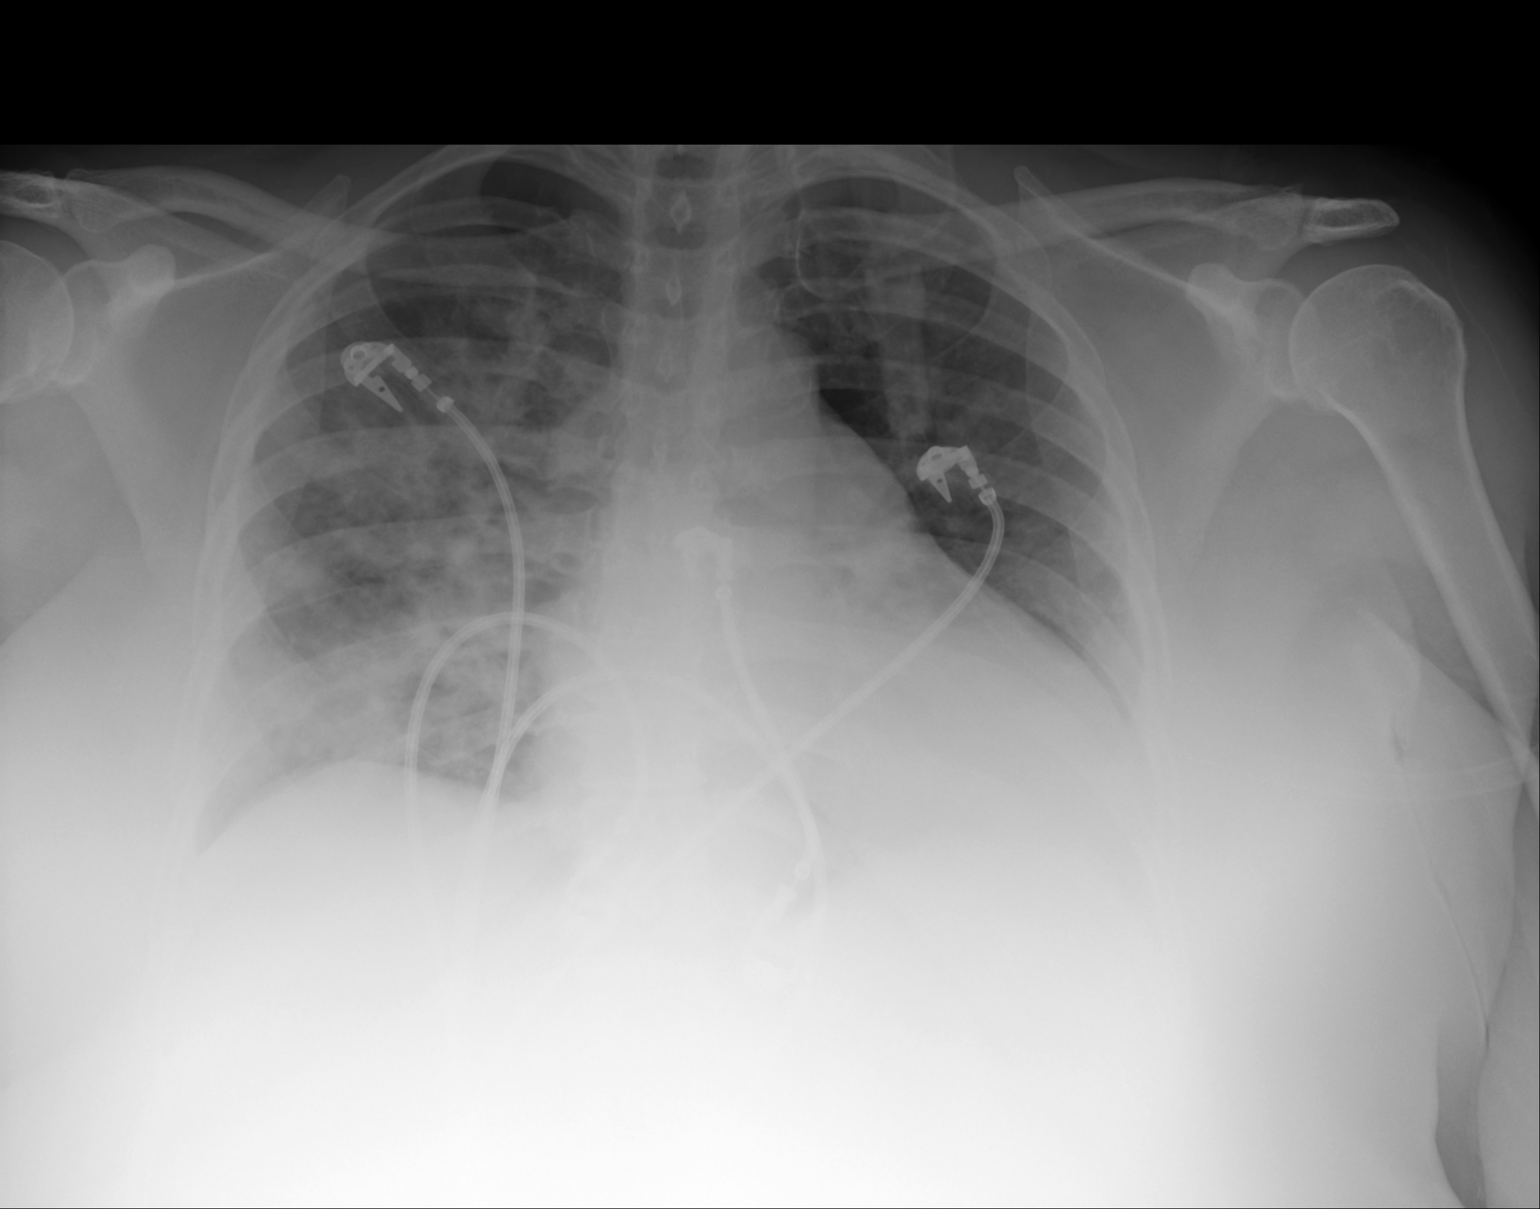

[1 of 1 positions shown; findings below may reference images not displayed]

PROCEDURE:     DXR - DXR PORTABLE CHEST SINGLE VIEW  - [DATE] [DATE]

RESULT:     Comparison is made to the previous exam of [DATE] and
images [DATE]. The cardiac silhouette is borderline enlarged.
There is increasing density in the right hemithorax concerning for
underlying pneumonia predominately in the right lower lobe area small
effusion is not excluded. The retrocardiac region is poorly demonstrated but
the diaphragm appears to be visualized. Monitoring electrodes are in place.
IMPRESSION: Findings concerning for right lower lobe pneumonia.
Followup PA and lateral views are recommended.

[REDACTED]

## 2012-04-18 LAB — CBC WITH DIFFERENTIAL/PLATELET
Eosinophil #: 0 10*3/uL (ref 0.0–0.7)
Eosinophil %: 0.3 %
Monocyte #: 0.5 x10 3/mm (ref 0.2–0.9)
Neutrophil %: 86.1 %
RBC: 3.5 10*6/uL — ABNORMAL LOW (ref 3.80–5.20)
RDW: 15.2 % — ABNORMAL HIGH (ref 11.5–14.5)
WBC: 17.8 10*3/uL — ABNORMAL HIGH (ref 3.6–11.0)

## 2012-04-18 LAB — BASIC METABOLIC PANEL
Anion Gap: 10 (ref 7–16)
BUN: 18 mg/dL (ref 7–18)
Chloride: 99 mmol/L (ref 98–107)
Co2: 24 mmol/L (ref 21–32)
EGFR (Non-African Amer.): >60
Potassium: 4.2 mmol/L (ref 3.5–5.1)
Sodium: 133 mmol/L — ABNORMAL LOW (ref 136–145)

## 2012-04-18 LAB — CK TOTAL AND CKMB (NOT AT ARMC): CK-MB: <0.5 ng/mL — ABNORMAL LOW (ref 0.5–3.6)

## 2012-04-19 LAB — CBC WITH DIFFERENTIAL/PLATELET
Basophil #: 0.1 10*3/uL (ref 0.0–0.1)
Eosinophil #: 0 10*3/uL (ref 0.0–0.7)
Eosinophil %: 0 %
HGB: 10.6 g/dL — ABNORMAL LOW (ref 12.0–16.0)
Lymphocyte #: 1.6 10*3/uL (ref 1.0–3.6)
Monocyte #: 0.8 x10 3/mm (ref 0.2–0.9)
Monocyte %: 4.2 %
Neutrophil %: 86.9 %
RBC: 3.77 10*6/uL — ABNORMAL LOW (ref 3.80–5.20)
RDW: 15.1 % — ABNORMAL HIGH (ref 11.5–14.5)
WBC: 18.5 10*3/uL — ABNORMAL HIGH (ref 3.6–11.0)

## 2012-04-21 LAB — CBC WITH DIFFERENTIAL/PLATELET
Basophil %: 0.2 %
Eosinophil #: 0 10*3/uL (ref 0.0–0.7)
Eosinophil %: 0 %
HGB: 11 g/dL — ABNORMAL LOW (ref 12.0–16.0)
Lymphocyte %: 7.6 %
MCHC: 32.5 g/dL (ref 32.0–36.0)
Neutrophil #: 13.9 10*3/uL — ABNORMAL HIGH (ref 1.4–6.5)
Neutrophil %: 88.3 %
Platelet: 442 10*3/uL — ABNORMAL HIGH (ref 150–440)

## 2012-04-22 LAB — CREATININE, SERUM
Creatinine: 0.89 mg/dL (ref 0.60–1.30)
EGFR (African American): >60
EGFR (Non-African Amer.): >60

## 2012-04-23 LAB — CULTURE, BLOOD (SINGLE)

## 2012-06-22 ENCOUNTER — Ambulatory Visit: Payer: Self-pay | Admitting: Specialist

## 2012-06-22 IMAGING — CT CT CHEST W/O CM
1 of 2 series · 14 of 32 positions shown, 18 images · non-contrast
Comparison: none

REASON FOR EXAM: infilrate and restriction on PFT
COMMENTS:

PROCEDURE:     KCT - KCT CHEST WITHOUT CONTRAST  - [DATE] [DATE]
RESULT:     Comparison: [DATE]
TECHNIQUE: Multiple axial images of the chest were obtained without
intravenous contrast.

[Series 2: chest w/o 3.0 i31f 2 · axial · non-contrast · 0.75mm/px · z∈[-308,-83]mm · 14 of 89 slices shown, 18 images]
[im 7/89  mediastinal]
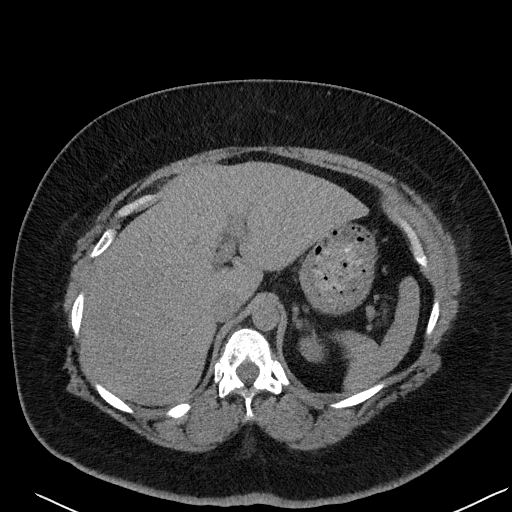
[im 7/89  lung]
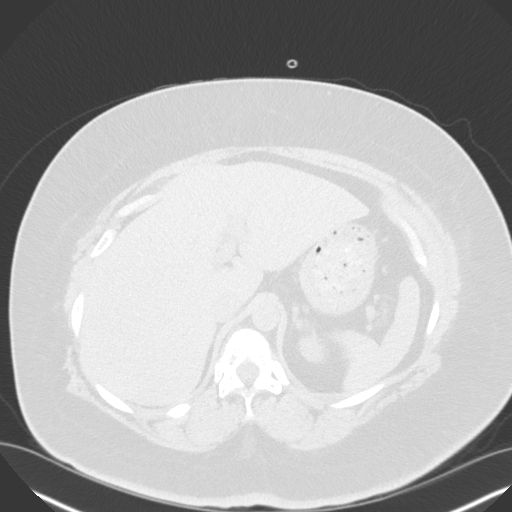
[im 14/89  lung]
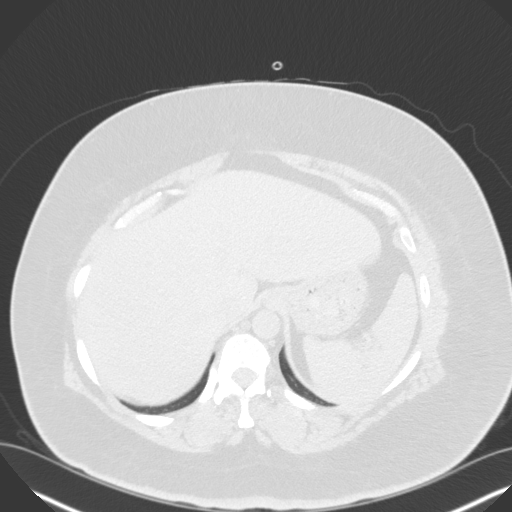
[im 21/89  lung]
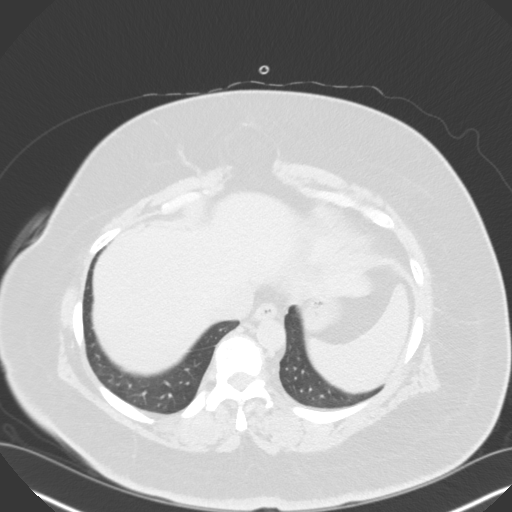
[im 28/89  lung]
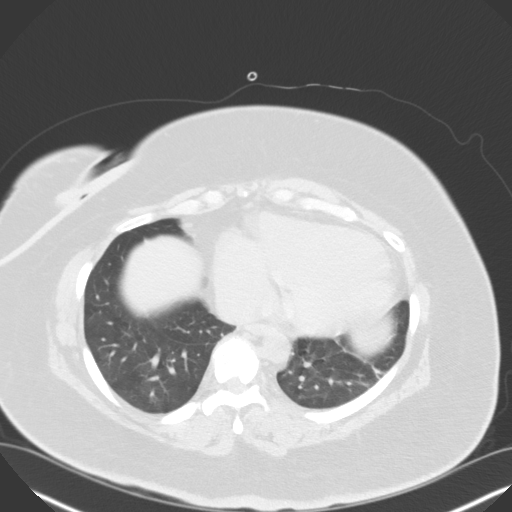
[im 34/89  mediastinal]
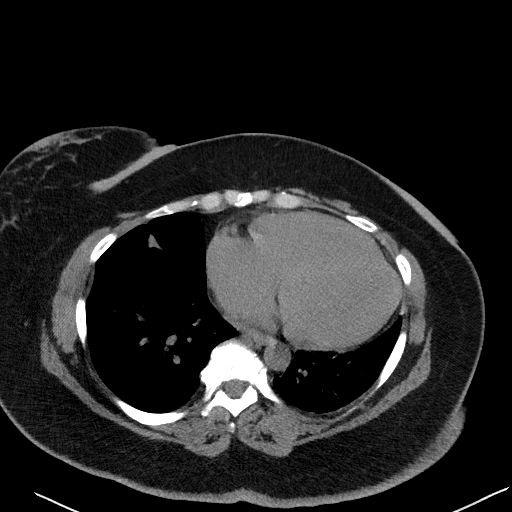
[im 34/89  lung]
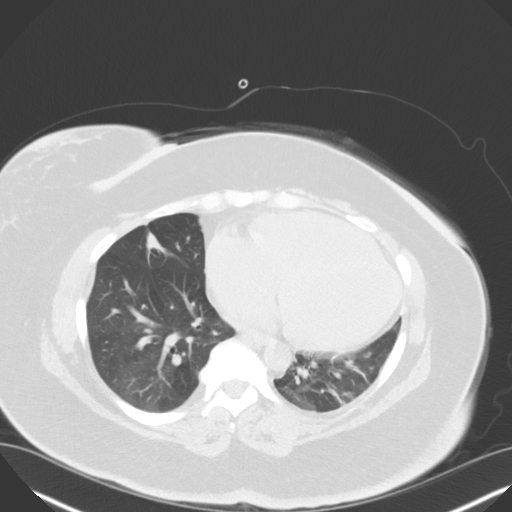
[im 41/89  lung]
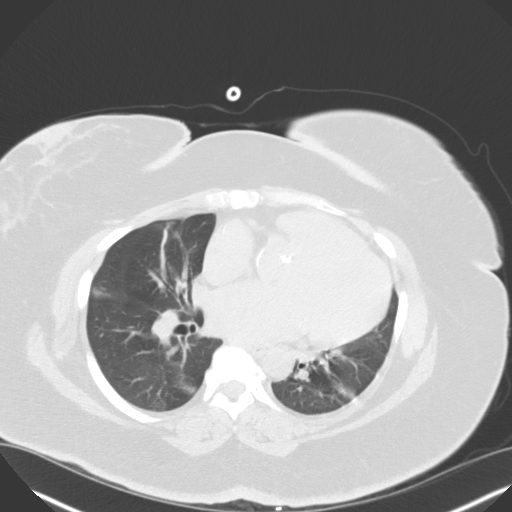
[im 42/89  lung]
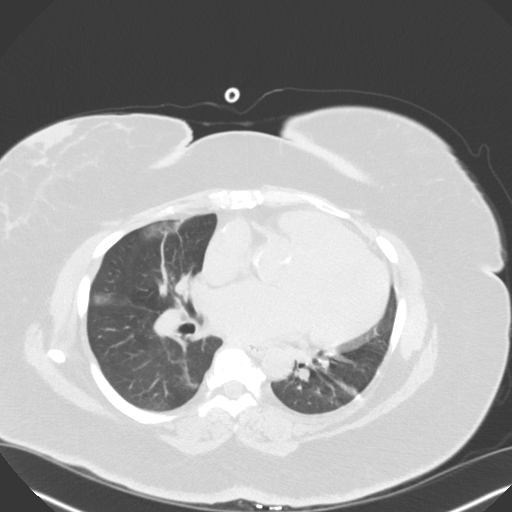
[im 45/89  lung]
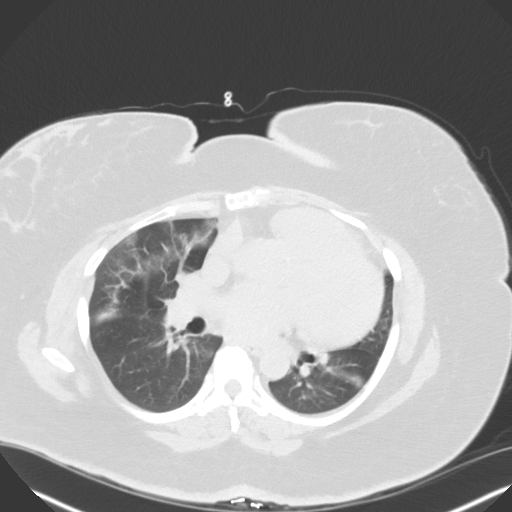
[im 48/89  mediastinal]
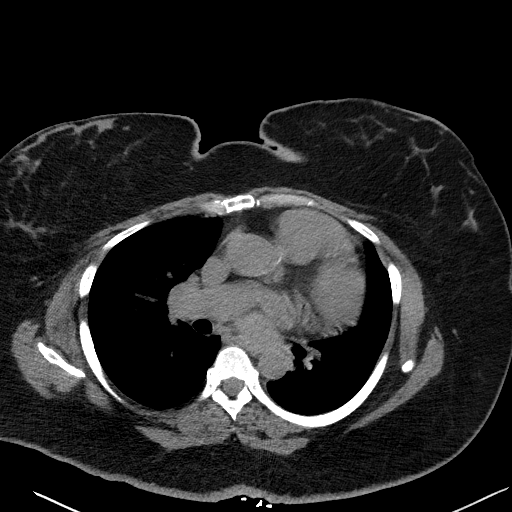
[im 48/89  lung]
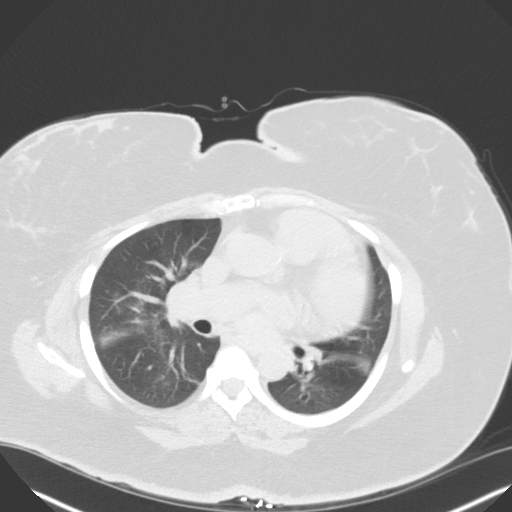
[im 55/89  lung]
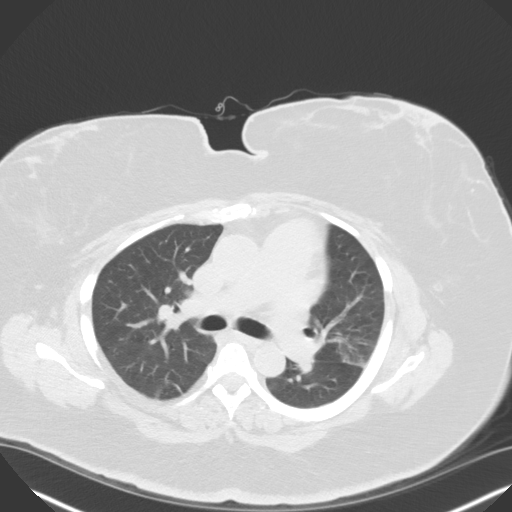
[im 61/89  lung]
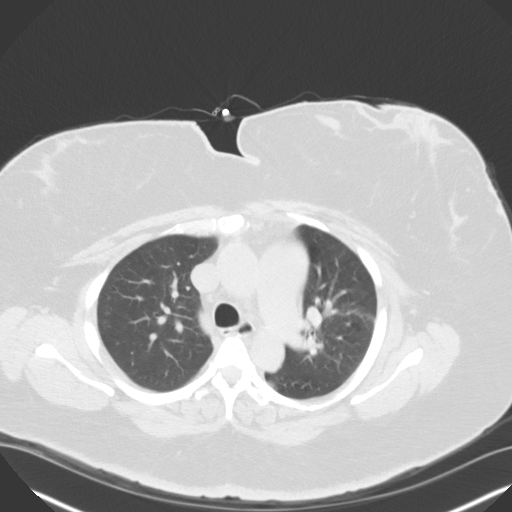
[im 68/89  lung]
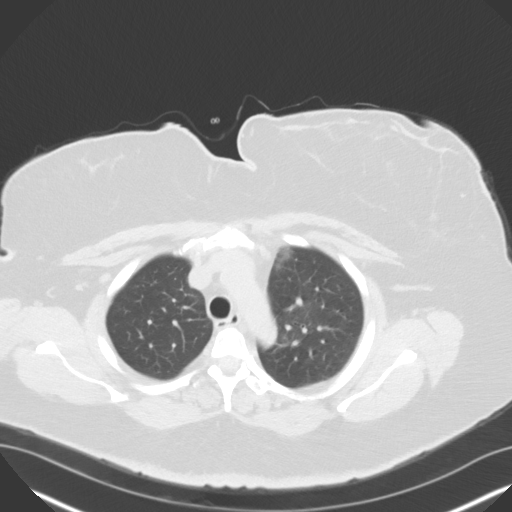
[im 75/89  mediastinal]
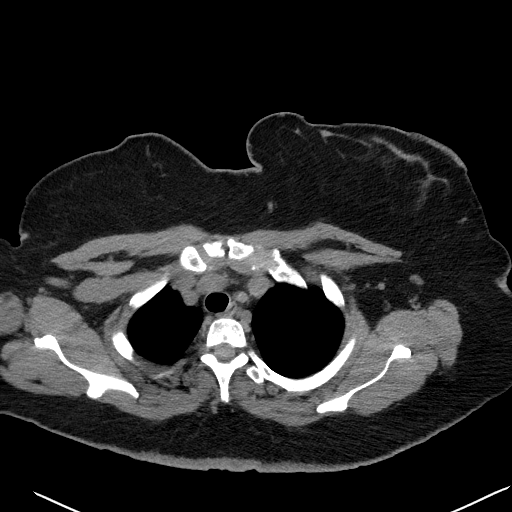
[im 75/89  lung]
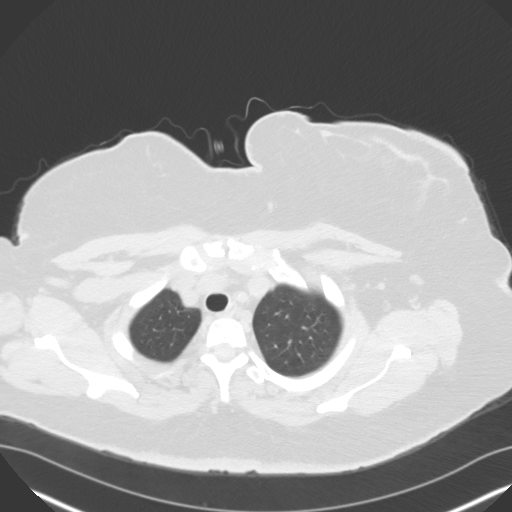
[im 82/89  lung]
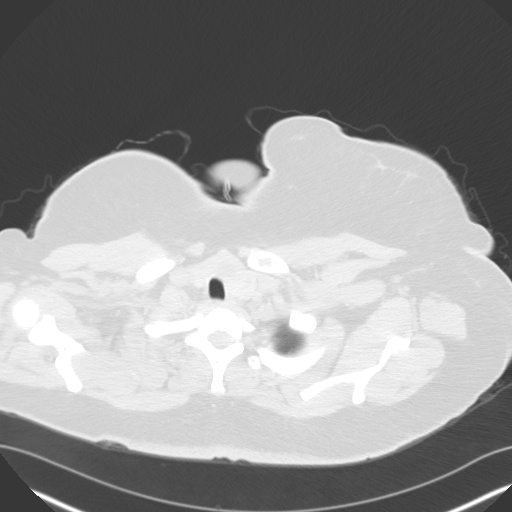

[14 of 32 positions shown; findings below may reference images not displayed]

FINDINGS: There is a 1.2 cm low-attenuation nodule in the region of the thyroid
isthmus, which is nonspecific. No mediastinal, hilar, or axillary
lymphadenopathy. There are calcifications in the coronary arteries. The
heart is borderline enlarged. The main pulmonary artery is enlarged, as can
be seen with pulmonary arterial hypertension. There is a small
fat-containing ventral abdominal hernia in the superior abdomen.

Mild opacities in the right lower lobe and inferior right upper lobe are
likely secondary to atelectasis. Similar findings are seen in the left upper
lobe and left lower lobe. There are a few 3-4 mm nodules in the bilateral
lungs. There is a small area of groundglass opacity in the anterior left
upper lobe, which is nonspecific, as seen on image 22. The central airways
are patent.

No aggressive lytic or sclerotic osseous lesions are identified.
IMPRESSION: 1. Scattered opacities in the bilateral lungs are likely related to
atelectasis. There is a small focal area of groundglass opacity in the
anterior left upper lobe. This could be infectious or inflammatory, or
possibly related to atelectasis, but is indeterminate. Followup noncontrast
chest CT is recommended in 3-6 months.
2. There are a few scattered 3-4 mm pulmonary nodules. Recommend attention
on the followup chest CT.
3. The main pulmonary artery is enlarged, as can be seen with pulmonary
arterial hypertension.

## 2012-06-29 ENCOUNTER — Emergency Department: Payer: Self-pay | Admitting: Emergency Medicine

## 2012-06-29 LAB — CBC
Platelet: 328 10*3/uL (ref 150–440)
RBC: 3.69 10*6/uL — ABNORMAL LOW (ref 3.80–5.20)
WBC: 8.4 10*3/uL (ref 3.6–11.0)

## 2012-06-29 LAB — BASIC METABOLIC PANEL
Anion Gap: 7 (ref 7–16)
Calcium, Total: 9.1 mg/dL (ref 8.5–10.1)
Chloride: 103 mmol/L (ref 98–107)
Co2: 30 mmol/L (ref 21–32)
Creatinine: 0.76 mg/dL (ref 0.60–1.30)
EGFR (African American): >60
EGFR (Non-African Amer.): >60
Glucose: 170 mg/dL — ABNORMAL HIGH (ref 65–99)
Sodium: 140 mmol/L (ref 136–145)

## 2012-06-29 LAB — PRO B NATRIURETIC PEPTIDE: B-Type Natriuretic Peptide: 204 pg/mL — ABNORMAL HIGH (ref 0–125)

## 2012-06-29 LAB — TROPONIN I: Troponin-I: <0.02 ng/mL

## 2012-06-29 IMAGING — CR DG CHEST 2V
1 series · 2 of 2 positions shown · non-contrast
Comparison: none

REASON FOR EXAM: SOB
COMMENTS:

[Series 1: w chest pa · 0.14mm/px · 2 of 2 slices shown]
[im 1/2]
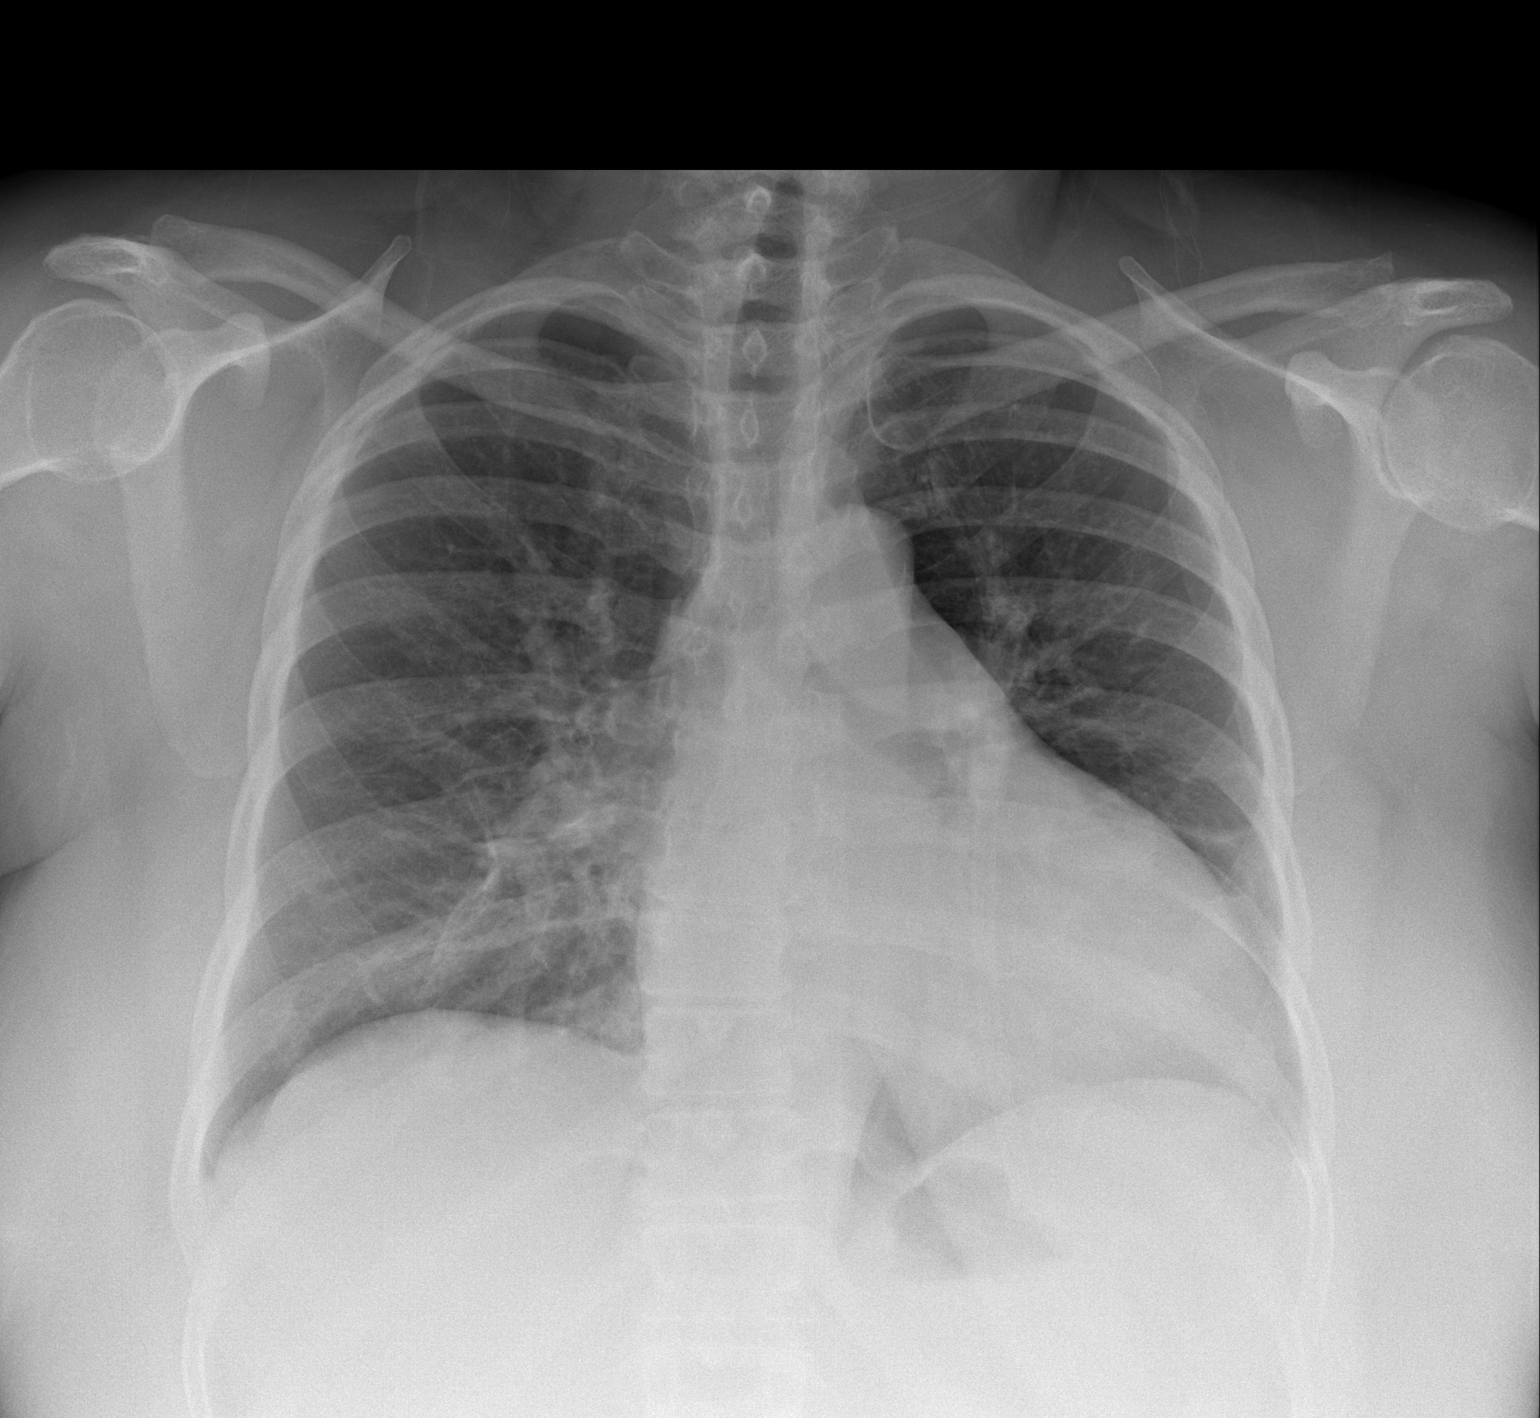
[im 2/2]
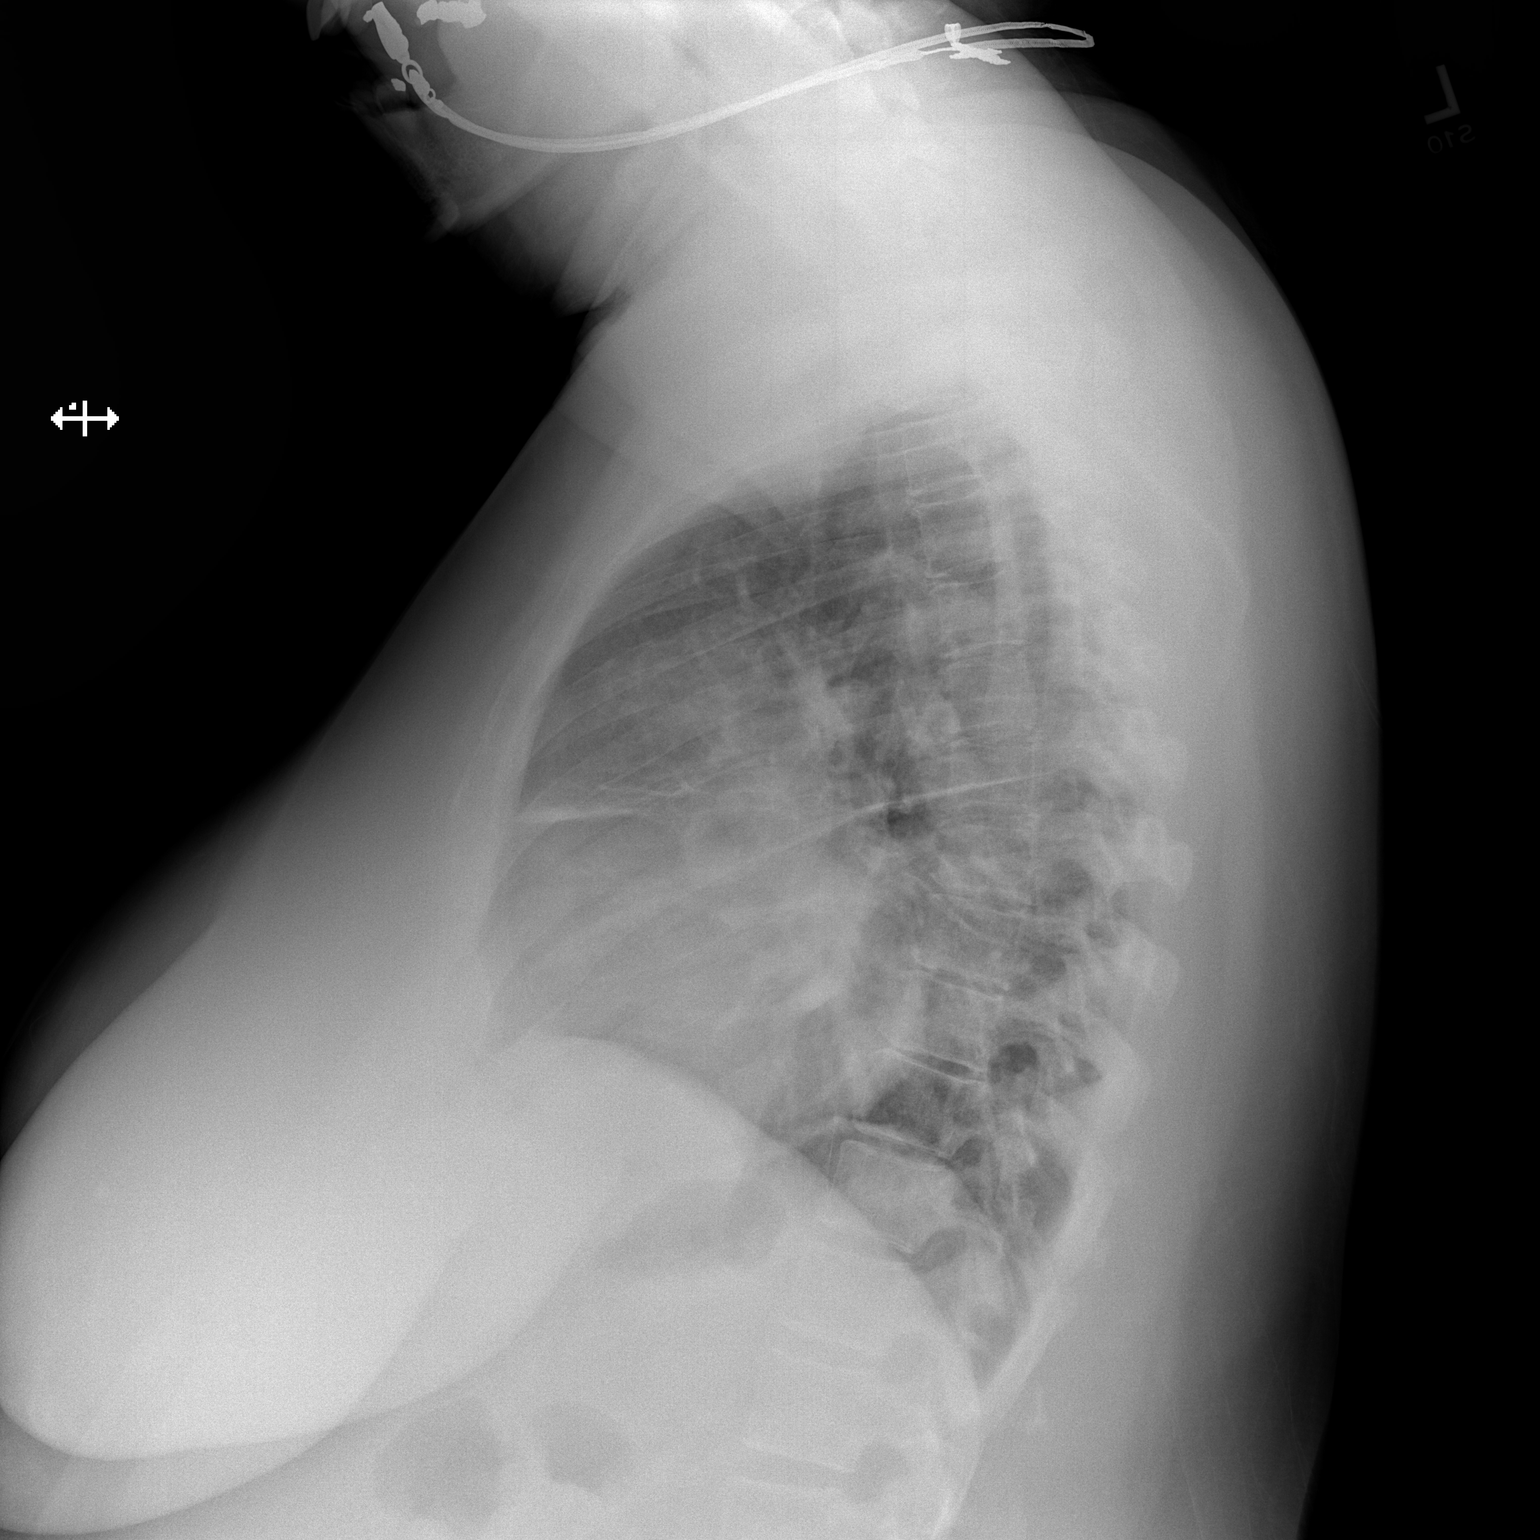

[2 of 2 positions shown; findings below may reference images not displayed]

PROCEDURE:     DXR - DXR CHEST PA (OR AP) AND LATERAL  - [DATE]  [DATE]

RESULT:     Comparison is made to the study [DATE].

The cardiac silhouette remains enlarged. The central pulmonary vascularity
is prominent and engorged. There is a small amount of fluid in the major
fissure and possibly in the minor fissure on the right. There is no alveolar
infiltrate. There is no significant pleural fluid collection in the
costophrenic gutters.
IMPRESSION: The findings are worrisome for low-grade CHF with very mild interstitial
edema. I cannot exclude a superimposed acute bronchitis in the appropriate
clinical setting. There is no alveolar pneumonia. There has been some
improvement in the appearance of the pulmonary interstitium since the
previous study however.

[REDACTED]

## 2012-07-05 LAB — CULTURE, BLOOD (SINGLE)

## 2012-07-23 DIAGNOSIS — E119 Type 2 diabetes mellitus without complications: Secondary | ICD-10-CM | POA: Insufficient documentation

## 2012-07-23 DIAGNOSIS — G8929 Other chronic pain: Secondary | ICD-10-CM | POA: Insufficient documentation

## 2012-08-02 ENCOUNTER — Emergency Department: Payer: Self-pay | Admitting: Emergency Medicine

## 2012-08-02 LAB — COMPREHENSIVE METABOLIC PANEL
Alkaline Phosphatase: 87 U/L (ref 50–136)
BUN: 9 mg/dL (ref 7–18)
Calcium, Total: 9 mg/dL (ref 8.5–10.1)
Chloride: 104 mmol/L (ref 98–107)
EGFR (African American): >60
Glucose: 209 mg/dL — ABNORMAL HIGH (ref 65–99)
Osmolality: 281 (ref 275–301)
Potassium: 4.2 mmol/L (ref 3.5–5.1)
Total Protein: 6.9 g/dL (ref 6.4–8.2)

## 2012-08-02 LAB — CBC
HCT: 30.2 % — ABNORMAL LOW (ref 35.0–47.0)
HGB: 9.8 g/dL — ABNORMAL LOW (ref 12.0–16.0)
MCH: 29.5 pg (ref 26.0–34.0)
MCHC: 32.6 g/dL (ref 32.0–36.0)
MCV: 90 fL (ref 80–100)
Platelet: 337 10*3/uL (ref 150–440)
RBC: 3.34 10*6/uL — ABNORMAL LOW (ref 3.80–5.20)
RDW: 14.4 % (ref 11.5–14.5)
WBC: 9.1 10*3/uL (ref 3.6–11.0)

## 2012-08-02 LAB — TROPONIN I: Troponin-I: <0.02 ng/mL

## 2012-08-02 IMAGING — CR DG CHEST 2V
1 series · 2 of 2 positions shown · non-contrast
Comparison: none

REASON FOR EXAM: sob, lower ext edema
COMMENTS:

[Series 1: w chest pa · 0.14mm/px · 2 of 2 slices shown]
[im 1/2]
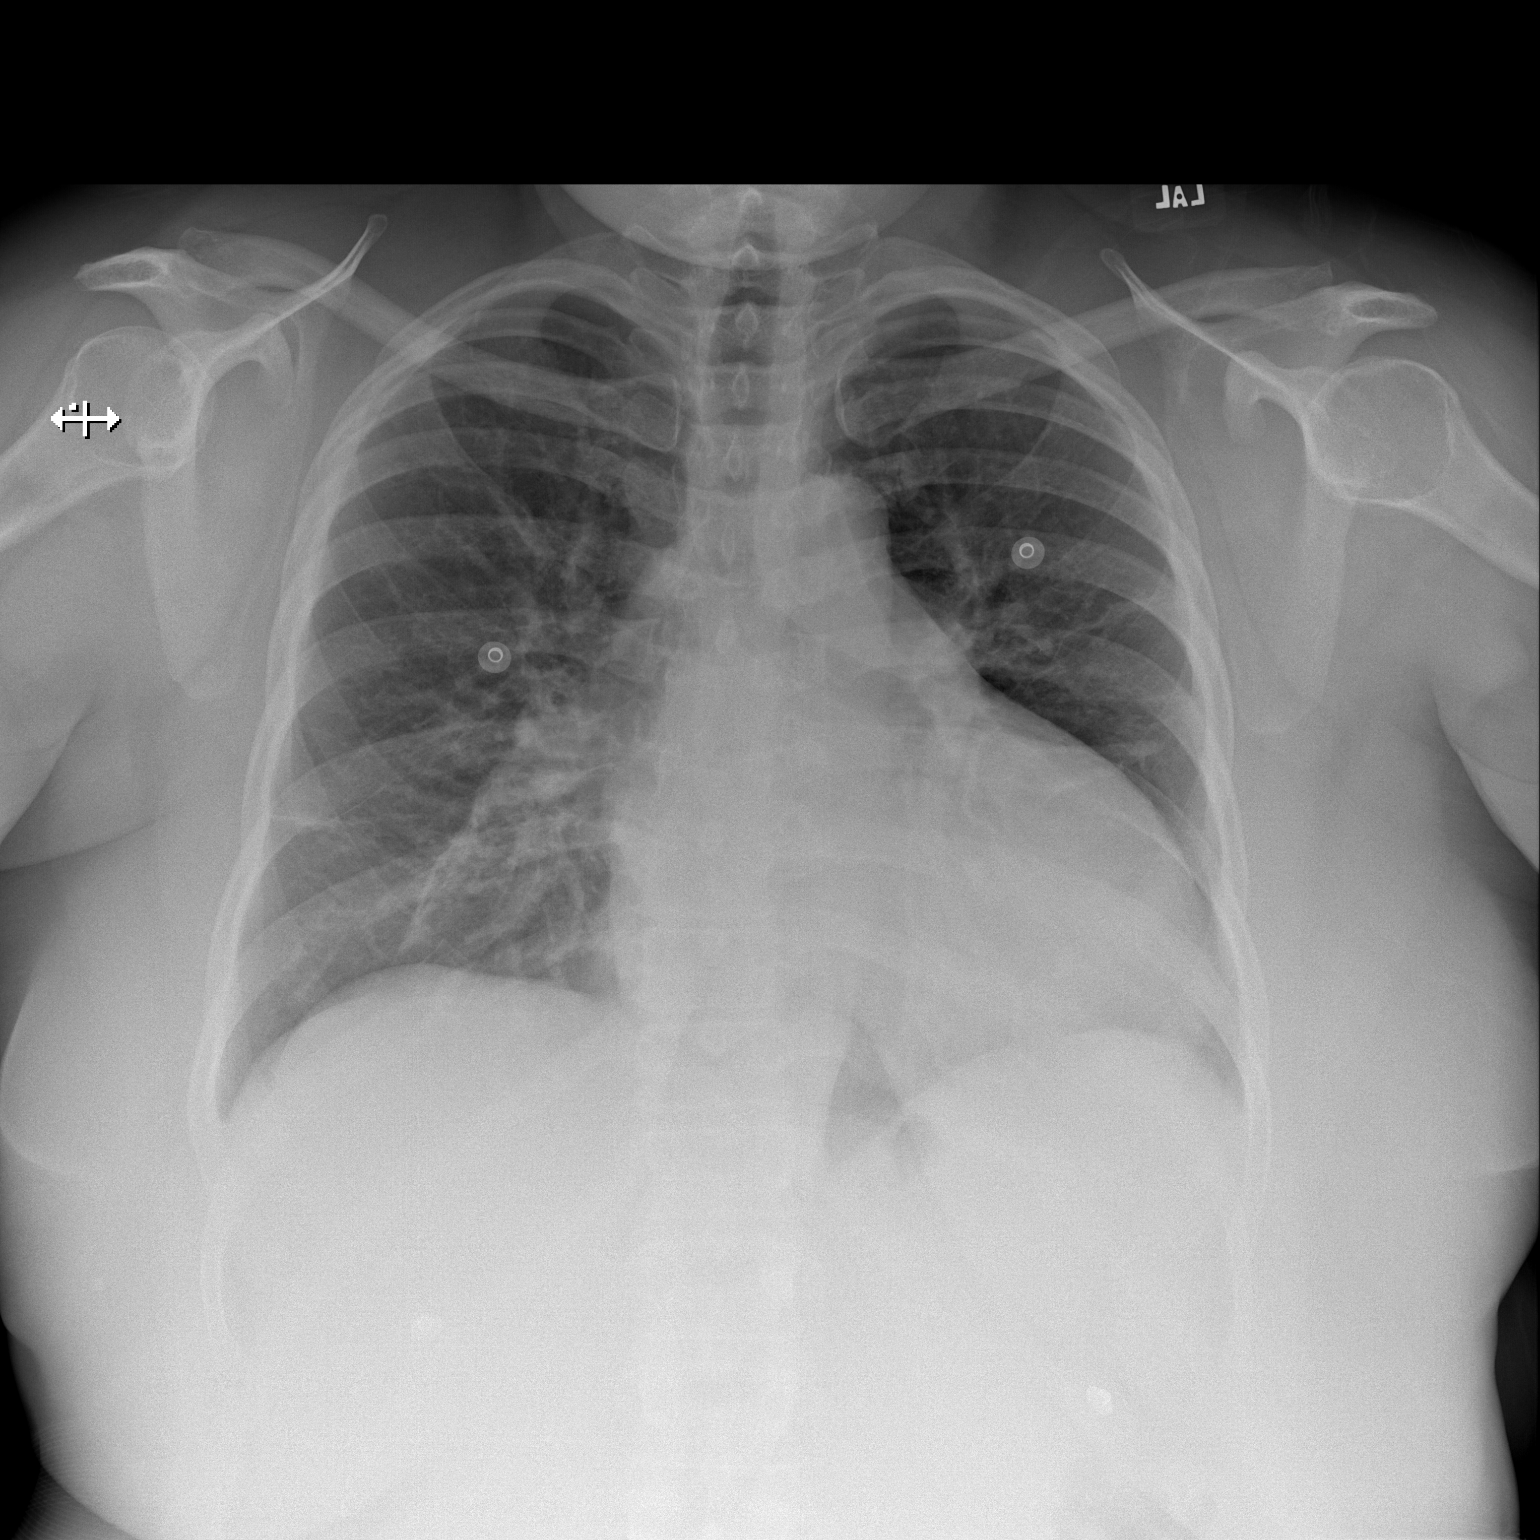
[im 2/2]
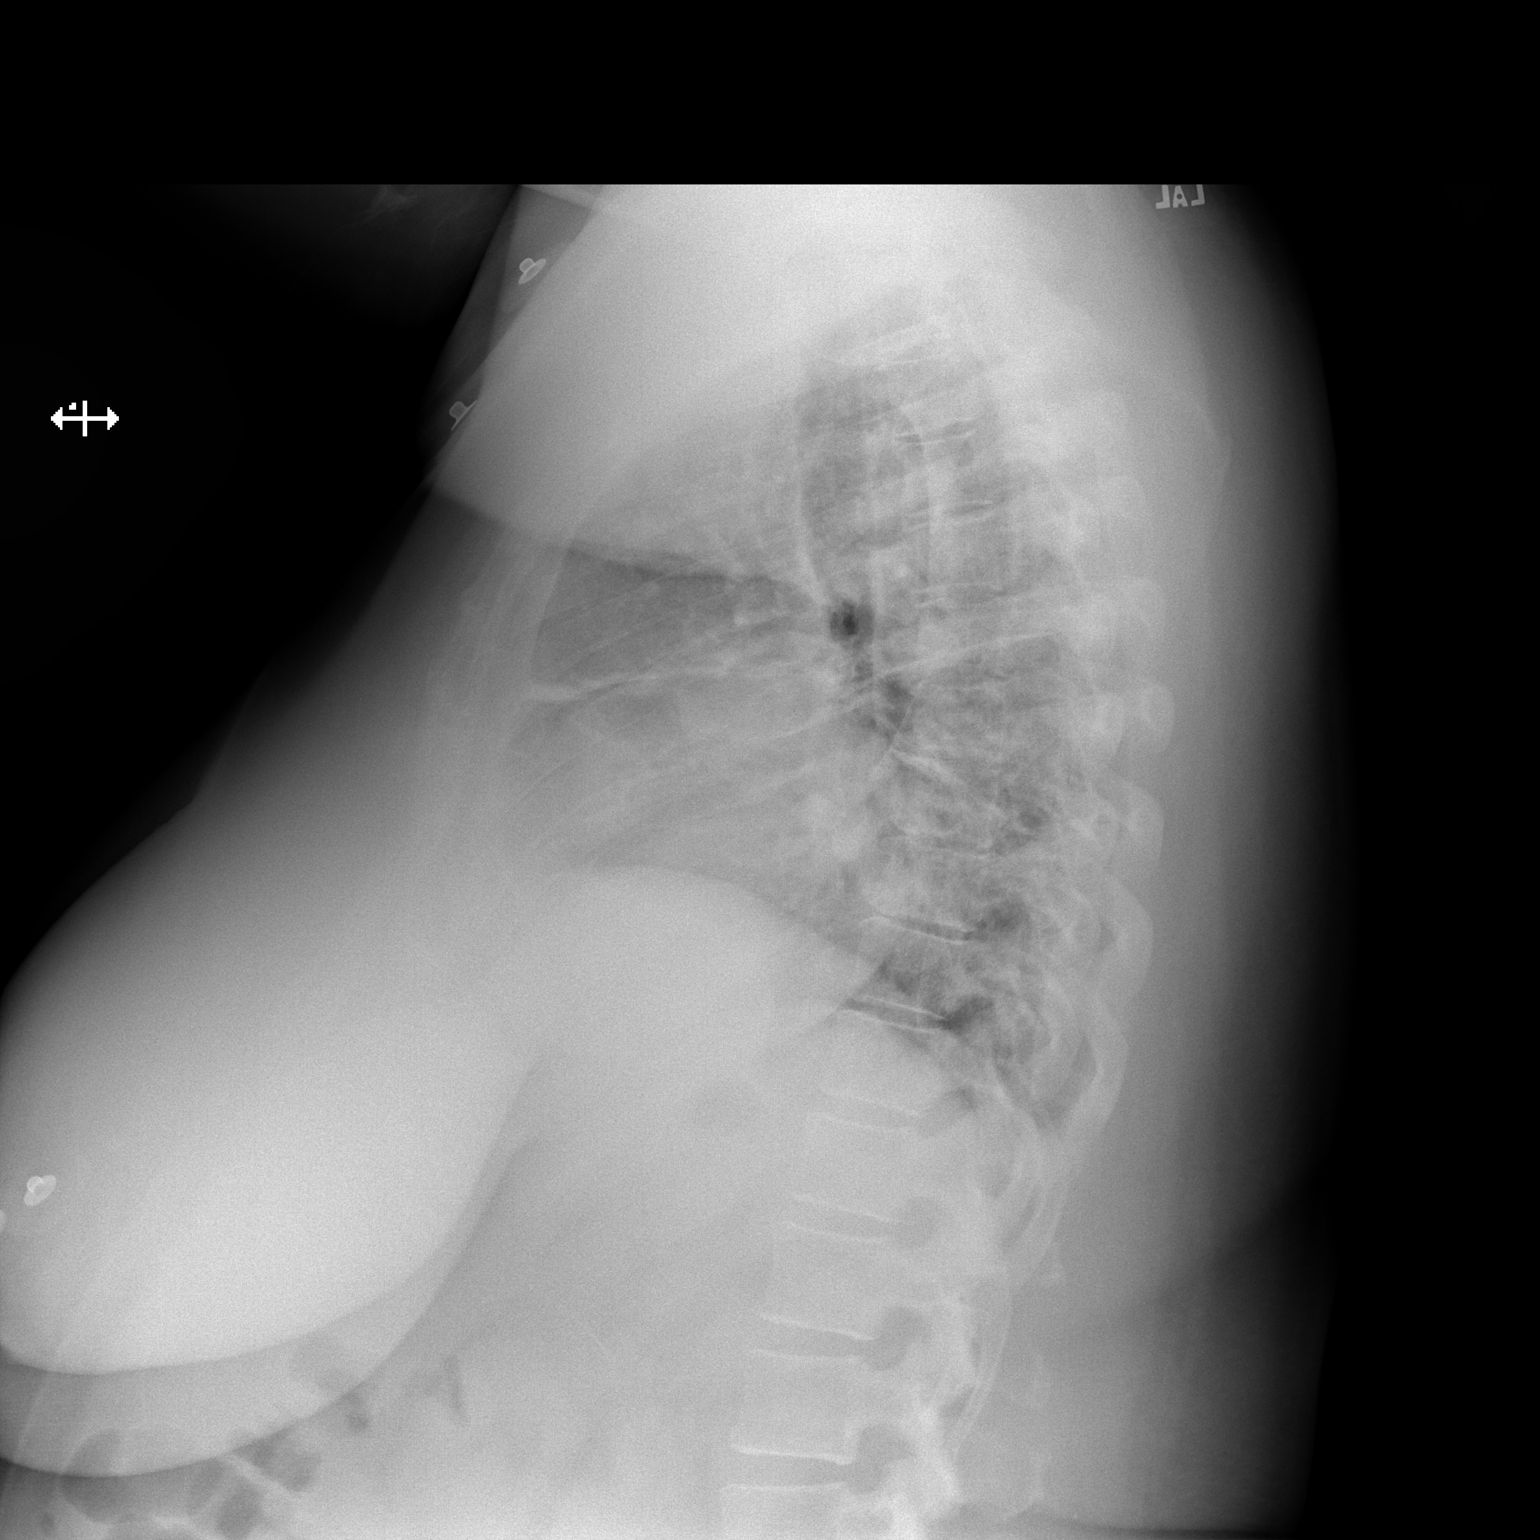

[2 of 2 positions shown; findings below may reference images not displayed]

PROCEDURE:     DXR - DXR CHEST PA (OR AP) AND LATERAL  - [DATE]  [DATE]

RESULT:     Comparison is made to the study [DATE].

The cardiac silhouette remains enlarged. The central pulmonary vascularity
remains engorged. The interstitial markings of both lungs are slightly more
conspicuous. There is no pleural effusion.
IMPRESSION: The findings are consistent with mild interstitial edema
that has worsened since the previous study. This is likely due to CHF.

[REDACTED]

## 2012-09-07 LAB — TROPONIN I: Troponin-I: <0.02 ng/mL

## 2012-09-07 LAB — COMPREHENSIVE METABOLIC PANEL
Bilirubin,Total: 0.2 mg/dL (ref 0.2–1.0)
Calcium, Total: 9.1 mg/dL (ref 8.5–10.1)
Creatinine: 0.86 mg/dL (ref 0.60–1.30)
EGFR (African American): >60
EGFR (Non-African Amer.): >60
Osmolality: 282 (ref 275–301)
Potassium: 3.3 mmol/L — ABNORMAL LOW (ref 3.5–5.1)
SGPT (ALT): 16 U/L (ref 12–78)
Sodium: 140 mmol/L (ref 136–145)

## 2012-09-07 LAB — CK TOTAL AND CKMB (NOT AT ARMC)
CK, Total: 130 U/L (ref 21–215)
CK-MB: 0.7 ng/mL (ref 0.5–3.6)

## 2012-09-07 LAB — CBC
HCT: 32.1 % — ABNORMAL LOW (ref 35.0–47.0)
MCH: 29 pg (ref 26.0–34.0)
MCHC: 32.6 g/dL (ref 32.0–36.0)
Platelet: 320 10*3/uL (ref 150–440)
RBC: 3.6 10*6/uL — ABNORMAL LOW (ref 3.80–5.20)
WBC: 9.8 10*3/uL (ref 3.6–11.0)

## 2012-09-07 LAB — LIPASE, BLOOD: Lipase: 618 U/L — ABNORMAL HIGH (ref 73–393)

## 2012-09-07 IMAGING — CR DG CHEST 2V
1 series · 2 of 2 positions shown · non-contrast
Comparison: none

REASON FOR EXAM: Chest pain
COMMENTS:   LMP: Post-Menopausal

[Series 2: w chest lat · 0.14mm/px · 2 of 2 slices shown]
[im 1/2]
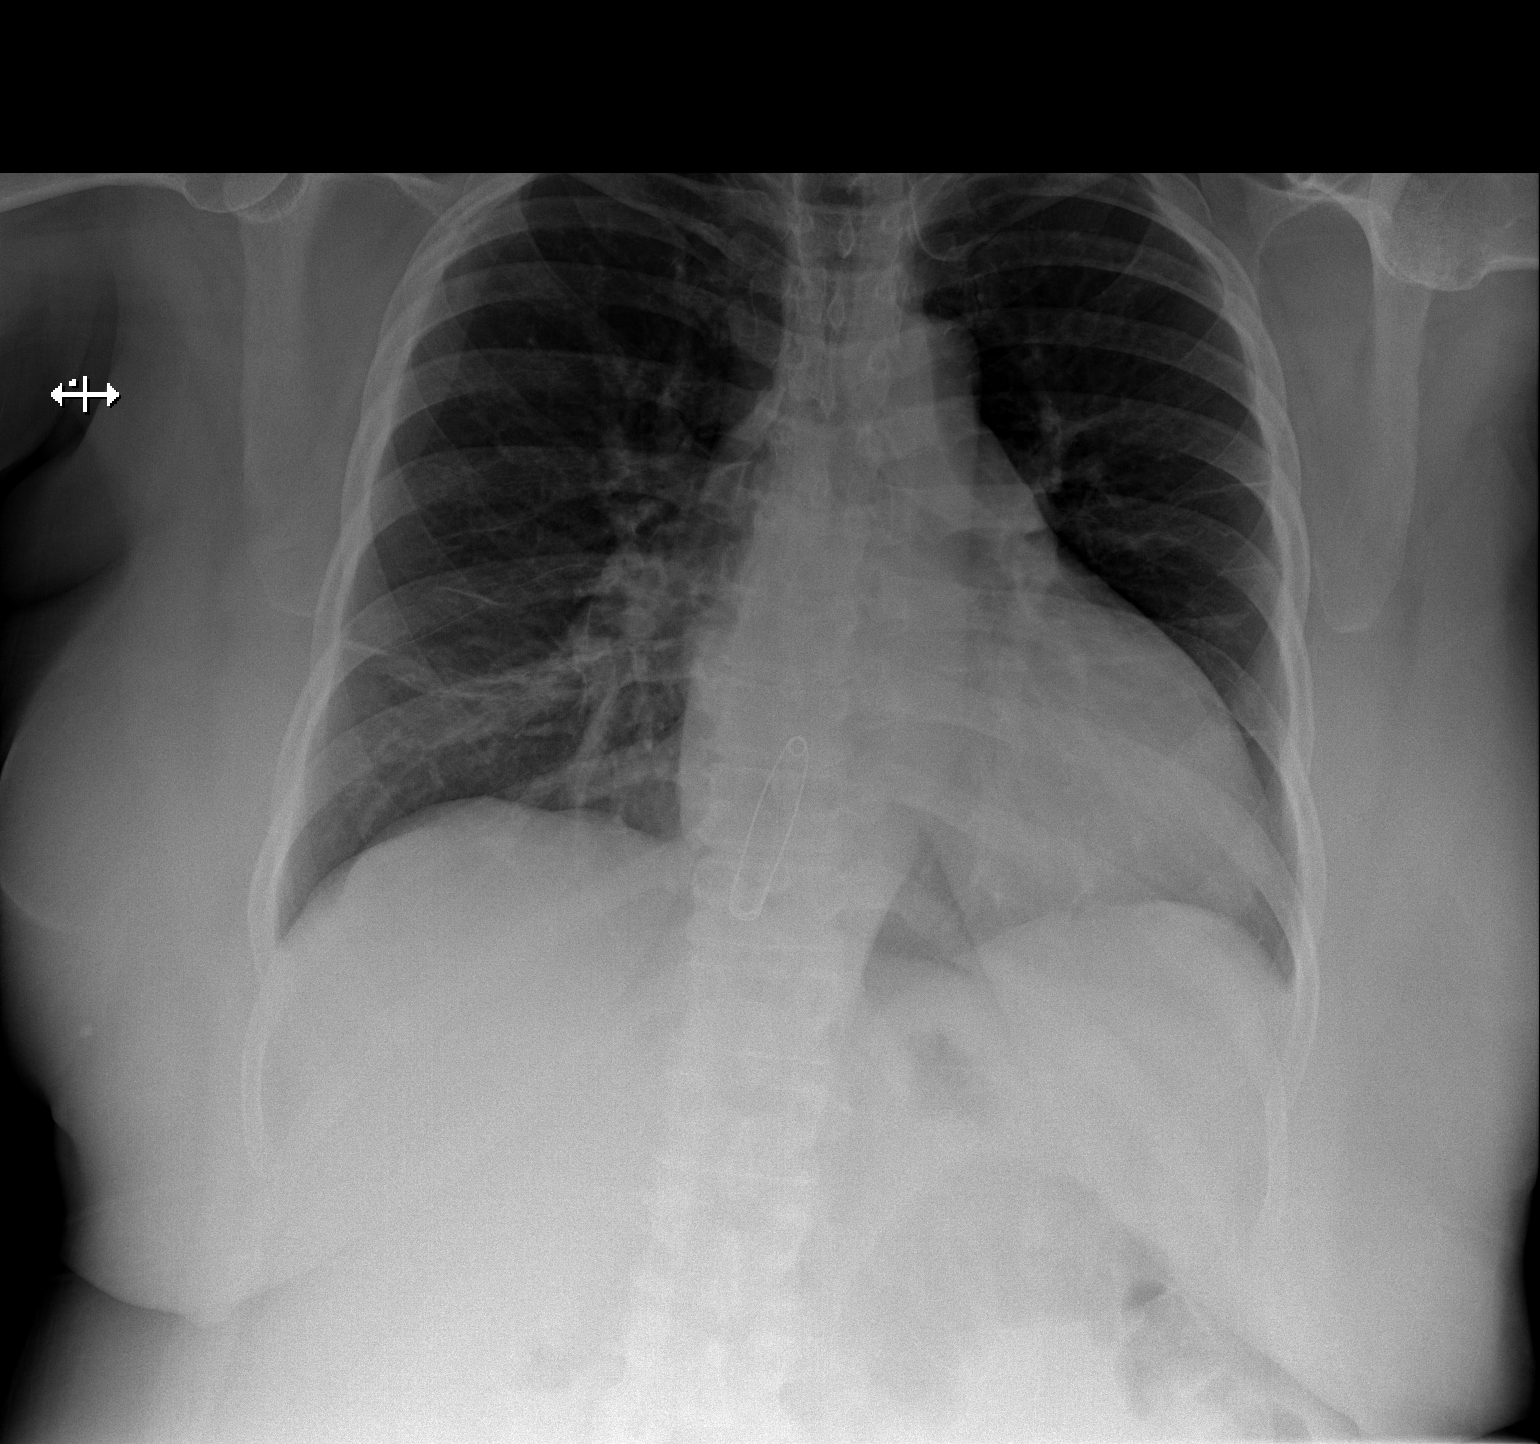
[im 2/2]
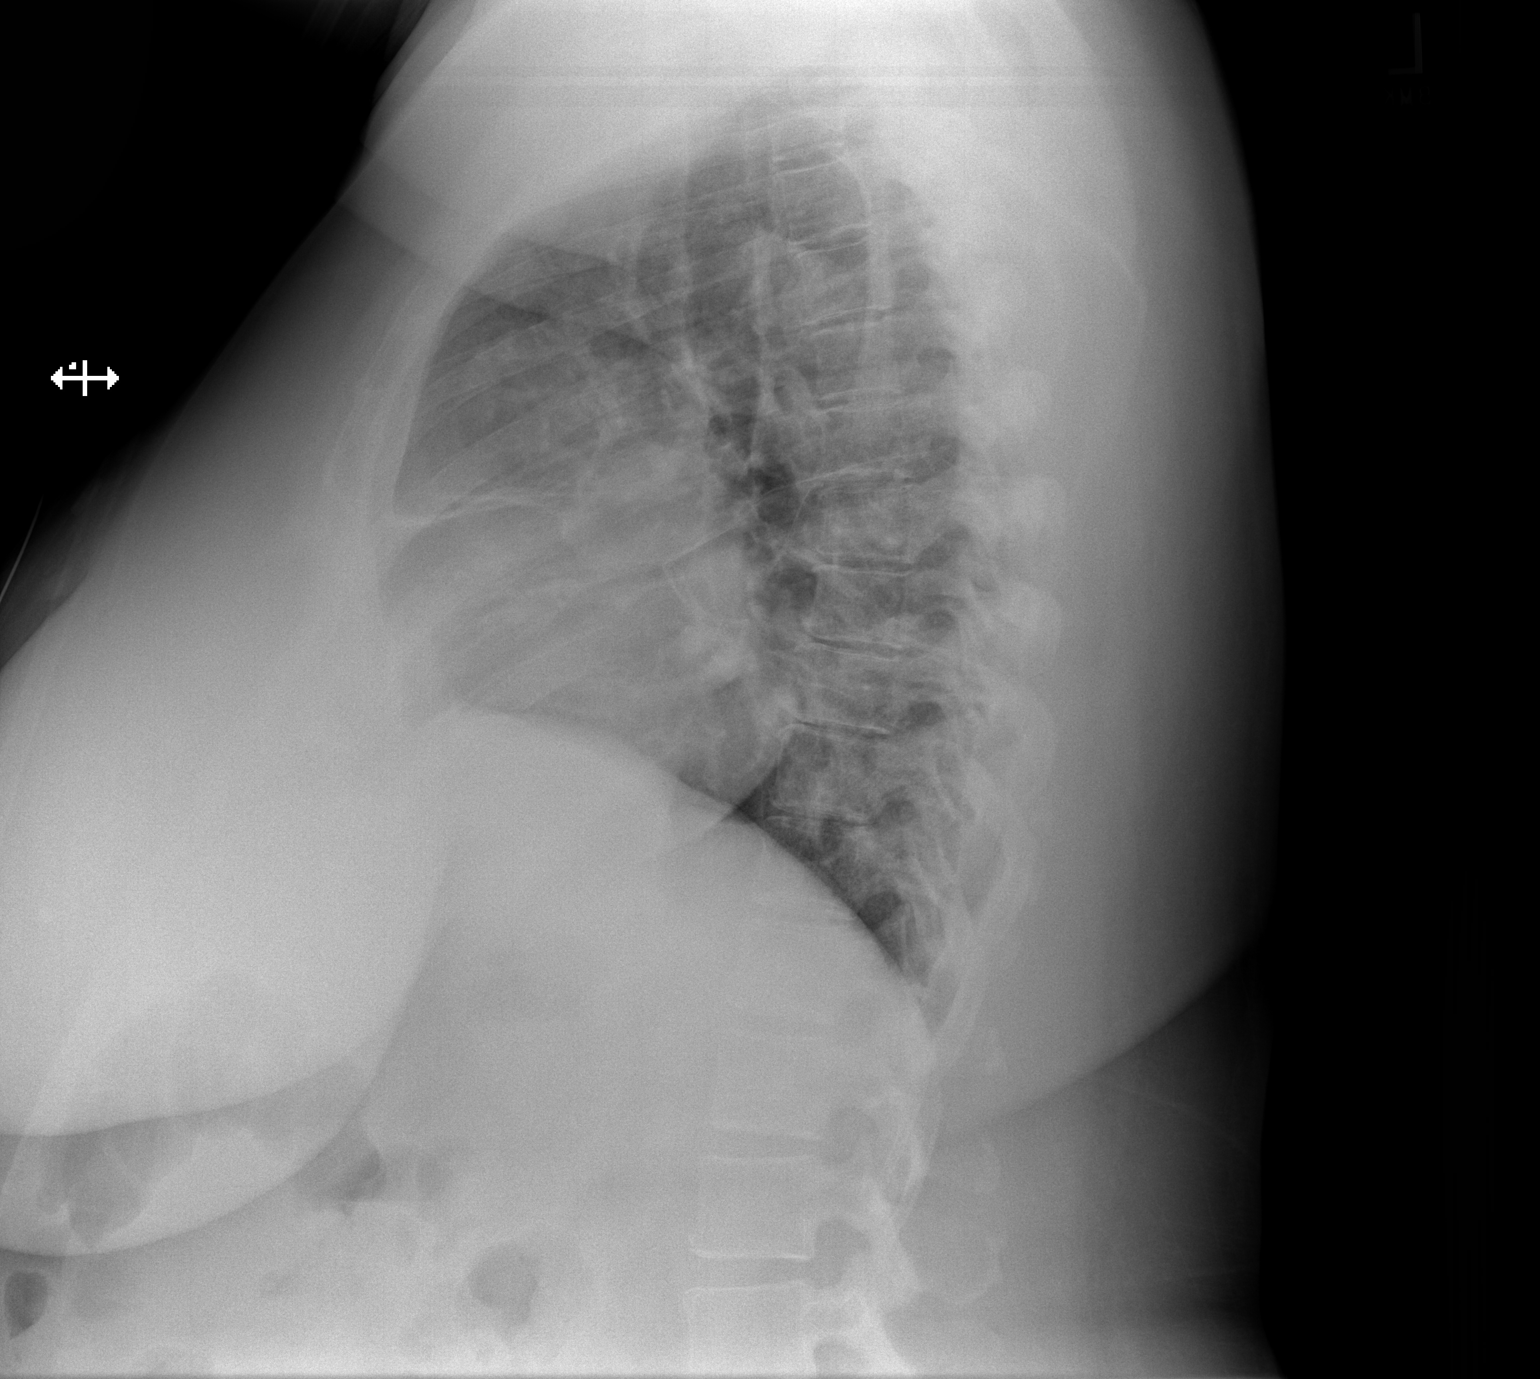

[2 of 2 positions shown; findings below may reference images not displayed]

PROCEDURE:     DXR - DXR CHEST PA (OR AP) AND LATERAL  - [DATE]  [DATE]

RESULT:     Comparison is made to previous exam of [DATE]. Cardiac
silhouette is enlarged and unchanged. There is linear increased density
laterally in the mid left lung and in the upper third of the left lung as
well as in the lower third of the right lung consistent with some right
middle lobe atelectasis and left upper lobe atelectasis. Linear fibrosis is
possible consideration. There is no effusion, significant consolidation or
pneumothorax. There is no edema. The bony structures appear intact.
IMPRESSION: 1. Stable chest x-ray with linear areas of chronic atelectasis or fibrosis
present.

[REDACTED]

## 2012-09-07 IMAGING — CT CT ABD-PELV W/ CM
1 of 2 series · 15 of 32 positions shown, 19 images · non-contrast
Comparison: none

REASON FOR EXAM: (1) RUQ, RLQ tenderenss n/v; (2) RUQ, RLQ tenderness n/v
COMMENTS:

[Series 2: 3mm soft tissue · axial · 0.75mm/px · z∈[-1087,-670]mm · 15 of 153 slices shown, 19 images]
[im 7/153  soft-tissue]
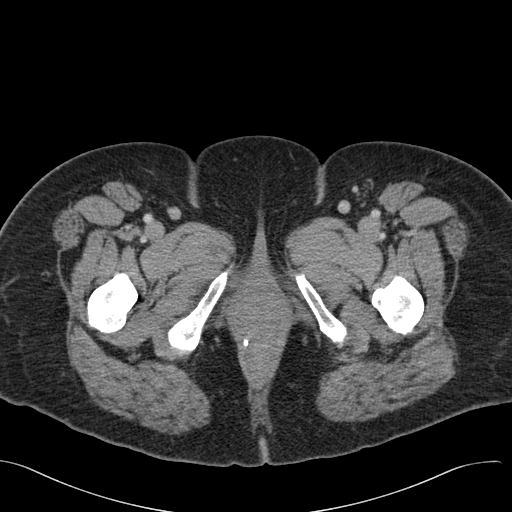
[im 7/153  bone]
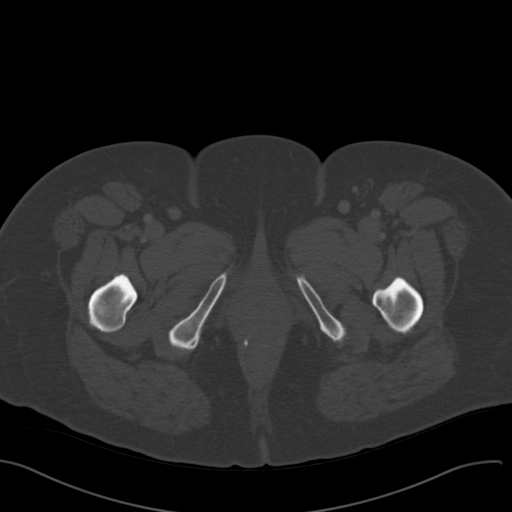
[im 21/153  soft-tissue]
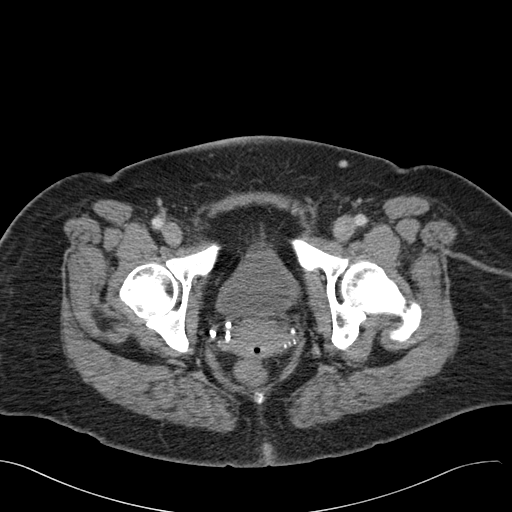
[im 35/153  soft-tissue]
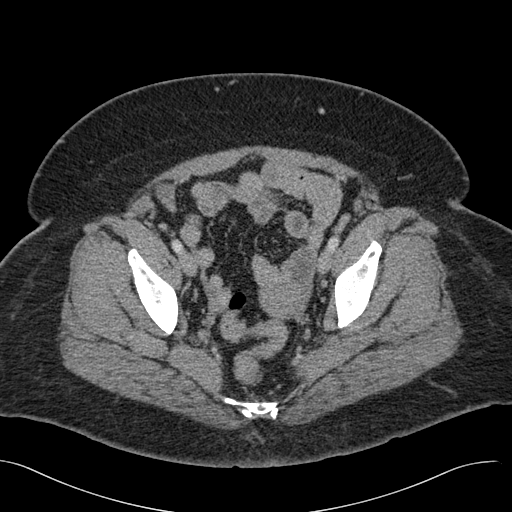
[im 42/153  soft-tissue]
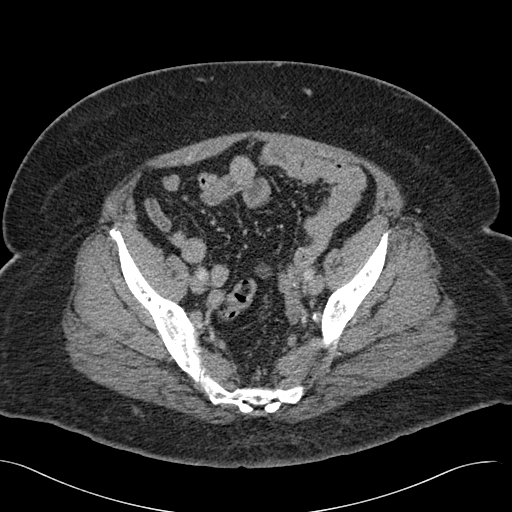
[im 56/153  soft-tissue]
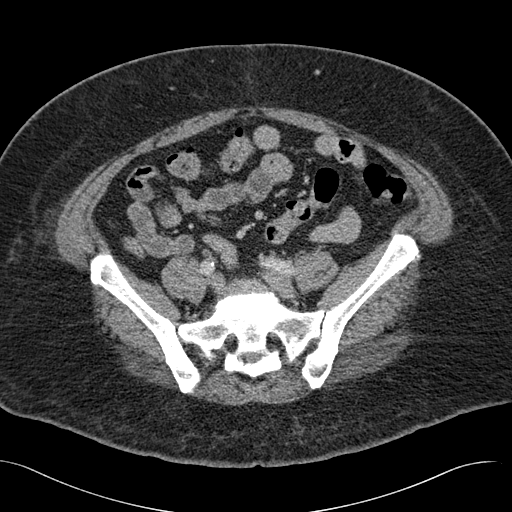
[im 63/153  soft-tissue]
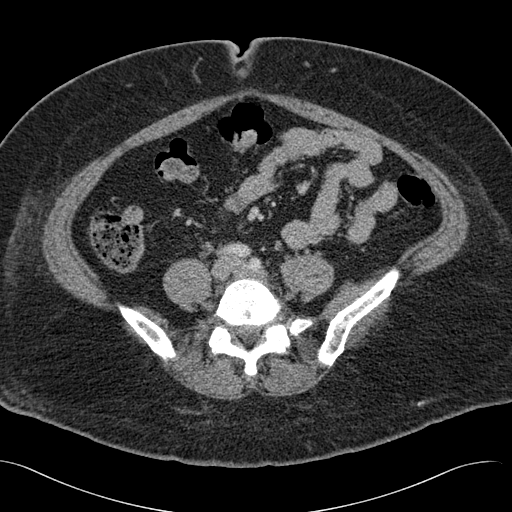
[im 77/153  soft-tissue]
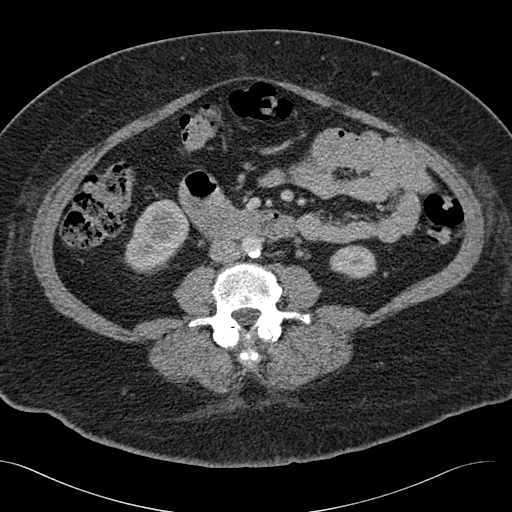
[im 90/153  soft-tissue]
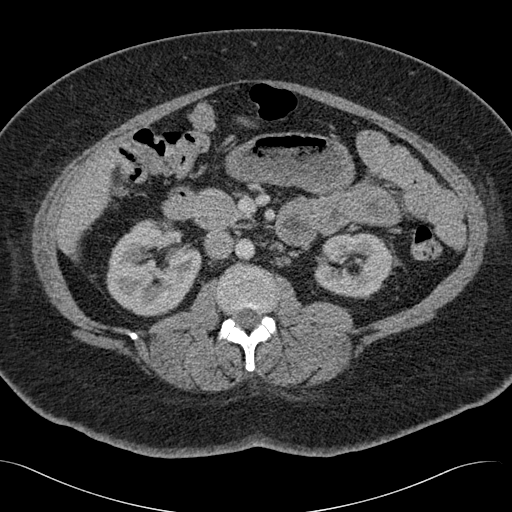
[im 97/153  soft-tissue]
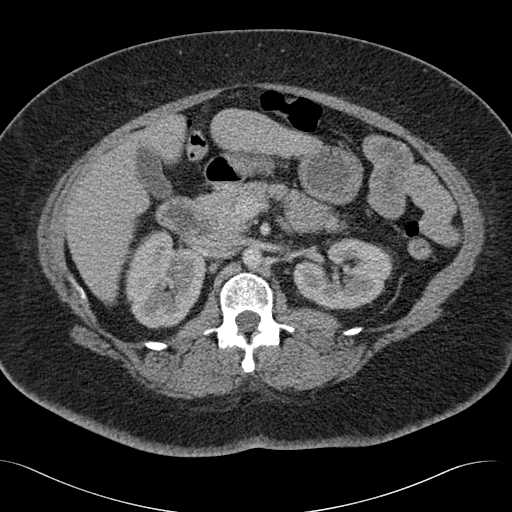
[im 97/153  bone]
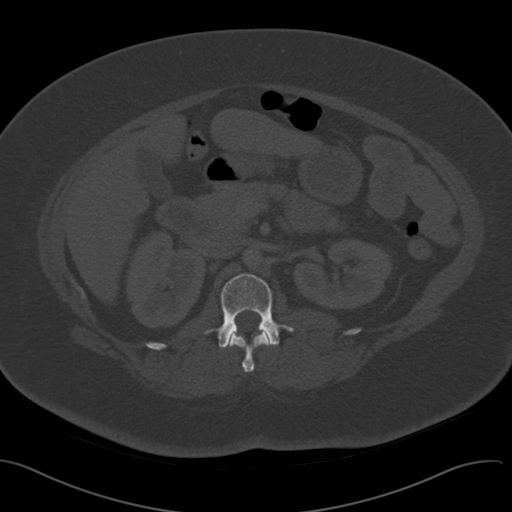
[im 111/153  soft-tissue]
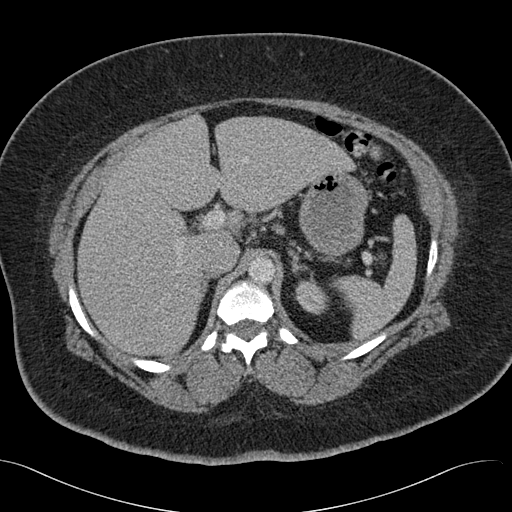
[im 118/153  soft-tissue]
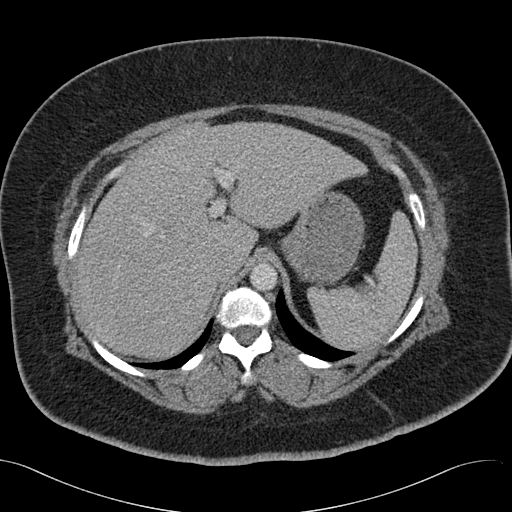
[im 125/153  lung]
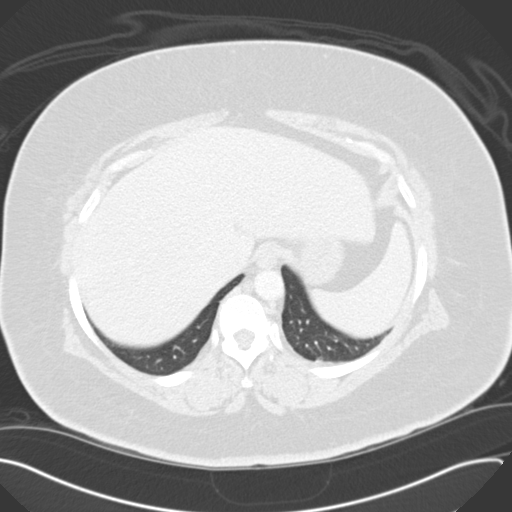
[im 132/153  soft-tissue]
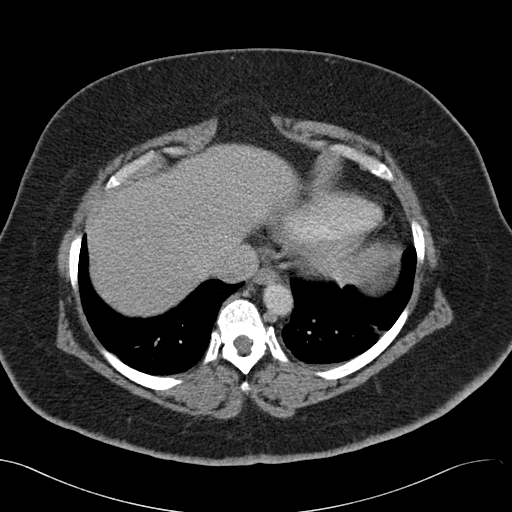
[im 132/153  lung]
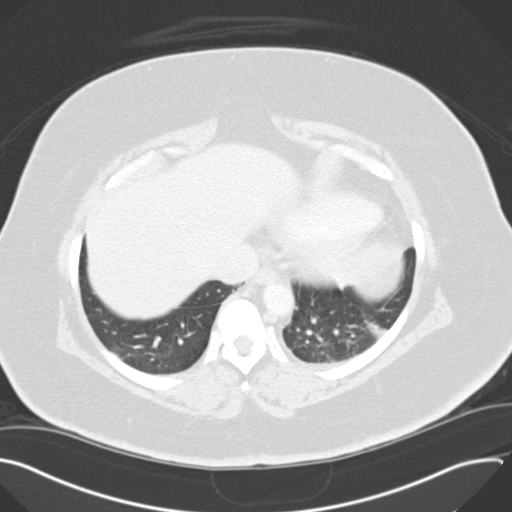
[im 139/153  lung]
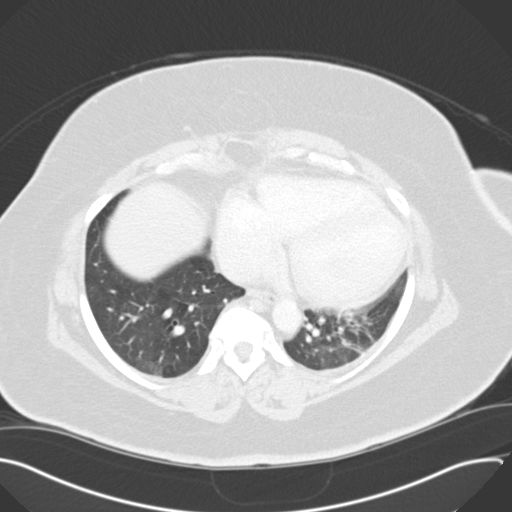
[im 146/153  soft-tissue]
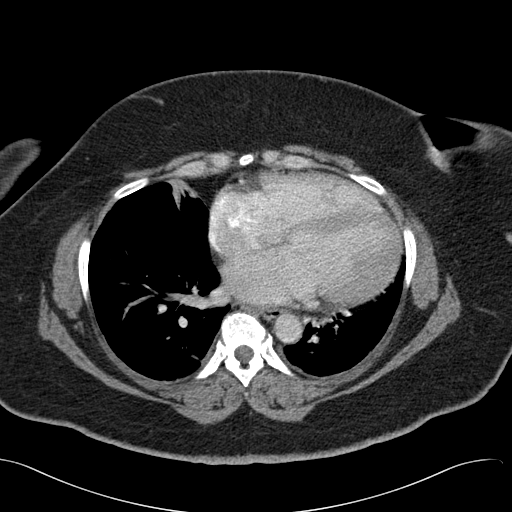
[im 146/153  lung]
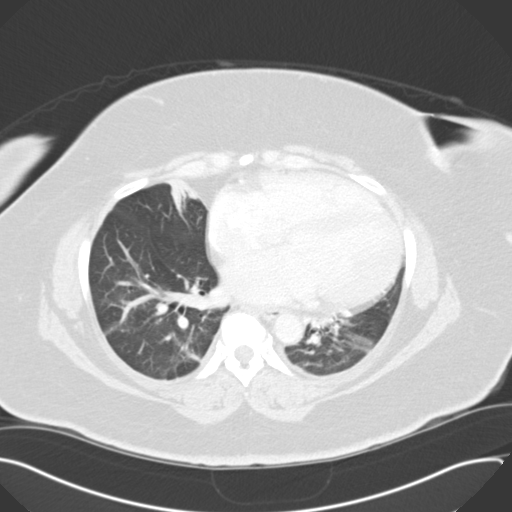

[15 of 32 positions shown; findings below may reference images not displayed]

PROCEDURE:     CT  - CT ABDOMEN / PELVIS  W  - [DATE]  [DATE]

RESULT:     CT of the abdomen and pelvis is performed utilizing 100 mL of
[LU] iodinated intravenous contrast. Standard oral contrast was not
administered at the request of the emergency department. Images are
reconstructed at 3 mm slice thickness in the axial plane and compared to a
previous noncontrast exam dated [DATE] and to an earlier study of
[DATE].

There is again noted a ventral hernia in the epigastric region containing
fat with a hernia defect measuring 4.1 cm. This appears unchanged. There is
some respiratory motion artifact in the lung bases with areas of presumed
atelectasis in the right middle lobe and bilateral lower lobes. There is no
discrete mass demonstrated. The heart appears to be enlarged. There is no
pleural or pericardial effusion. There is no evidence of hydronephrosis or
nephrolithiasis. Atherosclerotic calcification is present within the aorta
without aneurysm being demonstrated. There is no ascites or abnormal fluid
collection. No abnormal bowel distention or wall thickening is appreciated
on the noncontrast exam. The appendix appears normal. No radiopaque
gallstones are evident. The liver, spleen and pancreas as well as the
adrenal glands appear to be unremarkable. No adenopathy is evident. The bony
structures appear grossly normal.
IMPRESSION: 1. Stable fat filled ventral hernia in the epigastric region.
2. No acute abdominal or pelvic abnormality evident.
3. Lung base presumed atelectasis.
4. Cardiomegaly.

[REDACTED]

## 2012-09-08 ENCOUNTER — Observation Stay: Payer: Self-pay | Admitting: Internal Medicine

## 2012-09-08 LAB — CBC WITH DIFFERENTIAL/PLATELET
Basophil #: 0 10*3/uL (ref 0.0–0.1)
Eosinophil #: 0.2 10*3/uL (ref 0.0–0.7)
Eosinophil %: 2.6 %
HCT: 29.7 % — ABNORMAL LOW (ref 35.0–47.0)
Lymphocyte #: 2.3 10*3/uL (ref 1.0–3.6)
Lymphocyte %: 26.8 %
MCH: 28.9 pg (ref 26.0–34.0)
MCHC: 32.4 g/dL (ref 32.0–36.0)
Neutrophil %: 63.5 %
RBC: 3.32 10*6/uL — ABNORMAL LOW (ref 3.80–5.20)
RDW: 14 % (ref 11.5–14.5)

## 2012-09-08 LAB — CK TOTAL AND CKMB (NOT AT ARMC)
CK, Total: 104 U/L (ref 21–215)
CK, Total: 120 U/L (ref 21–215)
CK-MB: <0.5 ng/mL — ABNORMAL LOW (ref 0.5–3.6)
CK-MB: 0.7 ng/mL (ref 0.5–3.6)

## 2012-09-08 LAB — COMPREHENSIVE METABOLIC PANEL
Albumin: 3 g/dL — ABNORMAL LOW (ref 3.4–5.0)
Anion Gap: 6 — ABNORMAL LOW (ref 7–16)
Bilirubin,Total: 0.2 mg/dL (ref 0.2–1.0)
Chloride: 106 mmol/L (ref 98–107)
Co2: 32 mmol/L (ref 21–32)
Glucose: 74 mg/dL (ref 65–99)
Osmolality: 286 (ref 275–301)
Potassium: 3.6 mmol/L (ref 3.5–5.1)
SGOT(AST): 16 U/L (ref 15–37)

## 2012-09-08 LAB — LIPASE, BLOOD: Lipase: 478 U/L — ABNORMAL HIGH (ref 73–393)

## 2012-09-08 LAB — TROPONIN I: Troponin-I: <0.02 ng/mL

## 2012-09-08 LAB — AMYLASE: Amylase: 102 U/L (ref 25–115)

## 2012-09-08 LAB — LIPID PANEL
Cholesterol: 119 mg/dL (ref 0–200)
Ldl Cholesterol, Calc: 55 mg/dL (ref 0–100)
Triglycerides: 81 mg/dL (ref 0–200)

## 2012-09-08 LAB — MAGNESIUM: Magnesium: 1.3 mg/dL — ABNORMAL LOW

## 2012-09-08 LAB — HEMOGLOBIN A1C: Hemoglobin A1C: 6.5 % — ABNORMAL HIGH (ref 4.2–6.3)

## 2012-09-08 IMAGING — US ABDOMEN ULTRASOUND LIMITED
1 series · 14 of 25 positions shown · non-contrast
Comparison: none

REASON FOR EXAM: pancreatitis
COMMENTS:   Body Site: GB and Fossa, CBD, Head of Pancreas

[Series 1: abdomen ultrasound limited · 0.31mm/px · 14 of 55 slices shown]
[im 1/55]
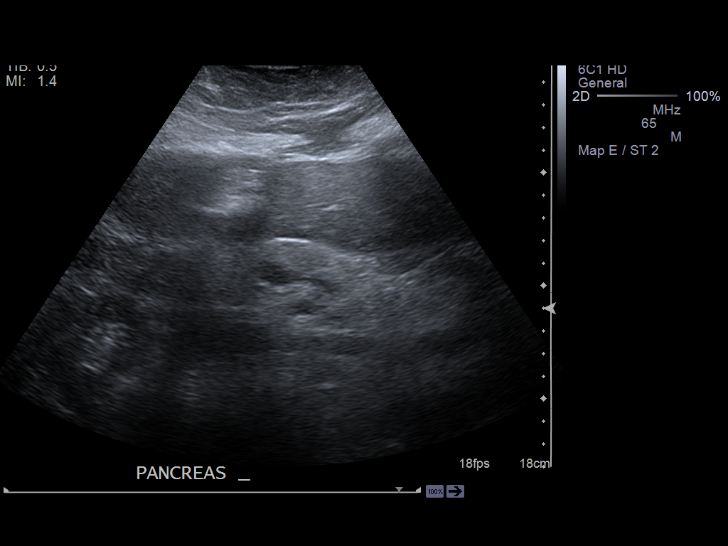
[im 5/55]
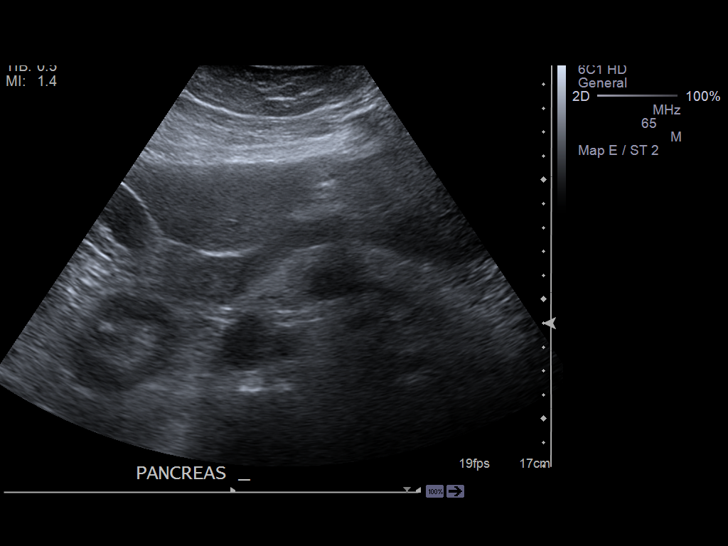
[im 10/55]
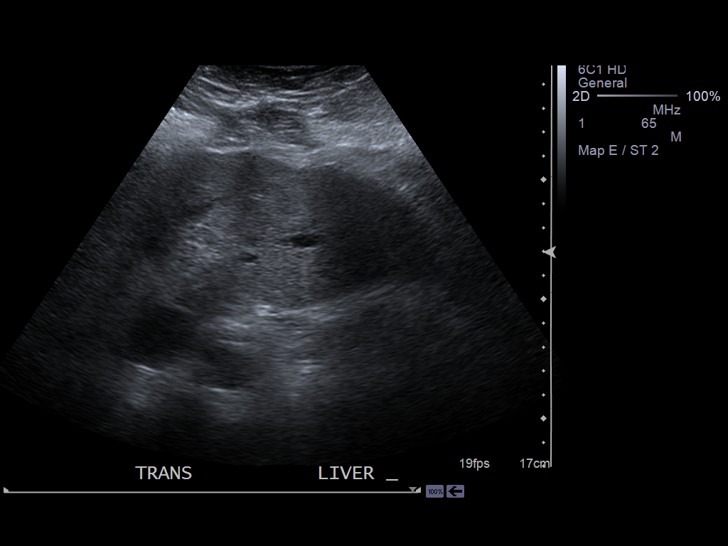
[im 14/55]
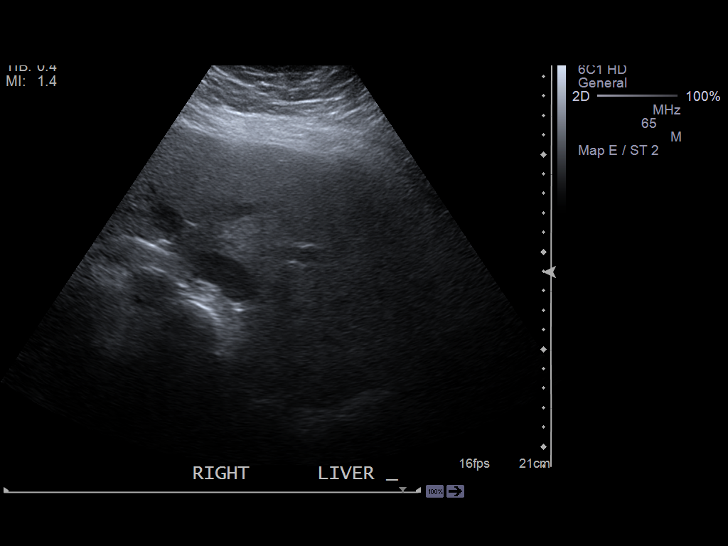
[im 19/55]
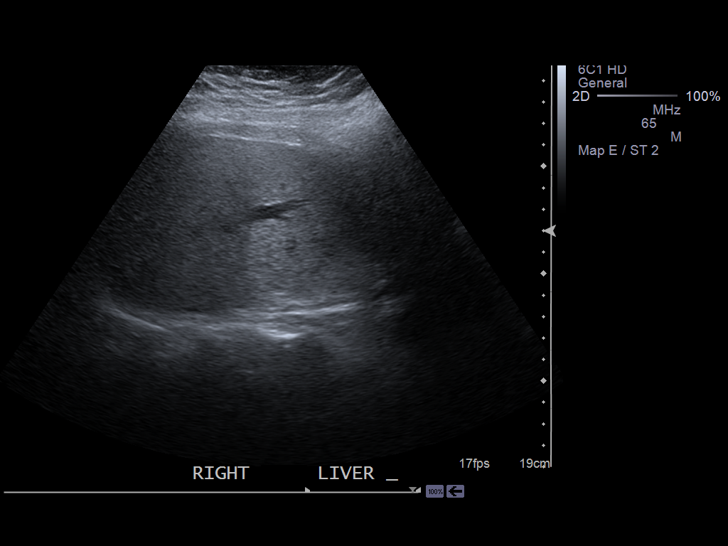
[im 21/55]
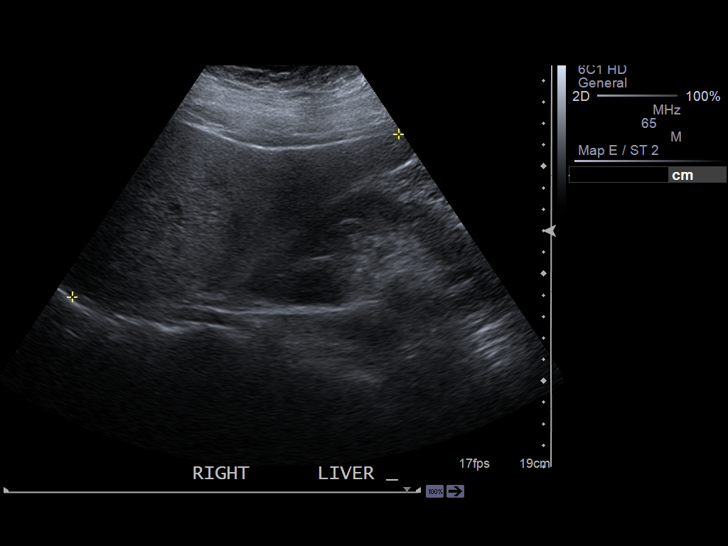
[im 25/55]
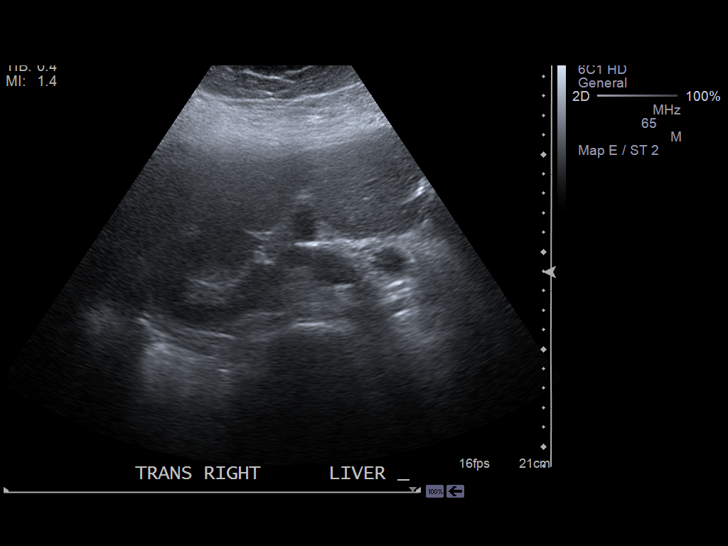
[im 30/55]
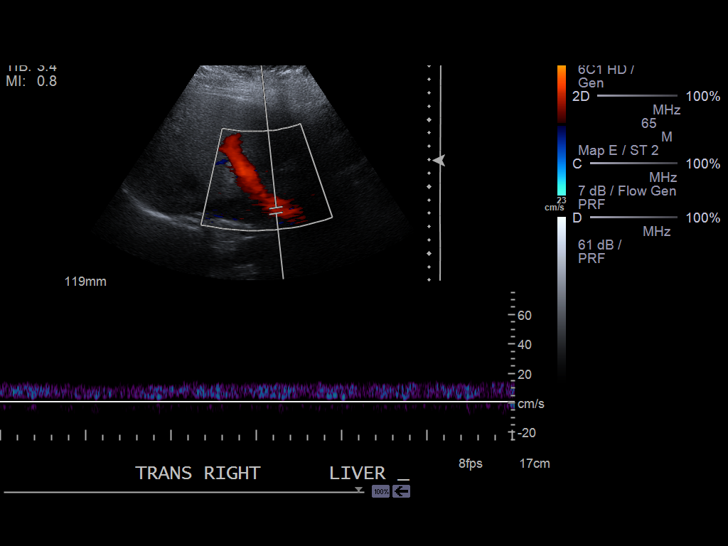
[im 34/55]
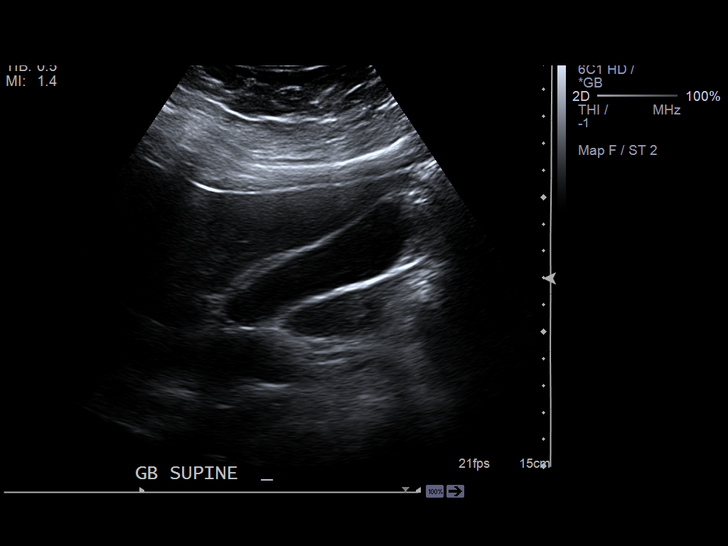
[im 37/55]
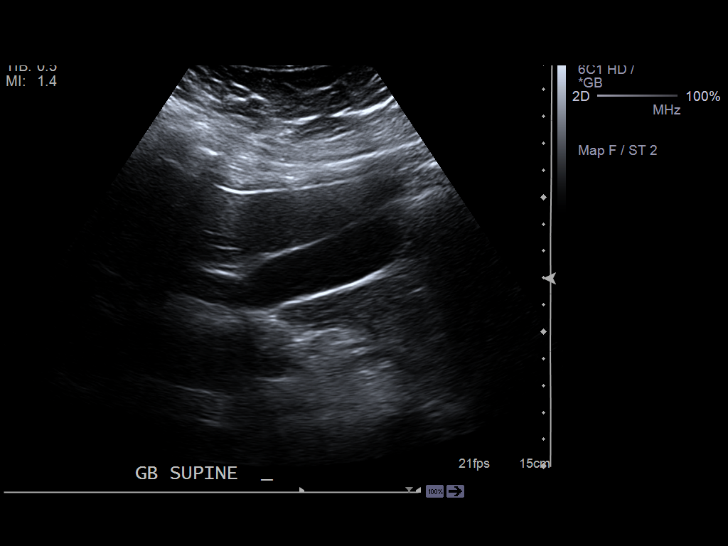
[im 41/55]
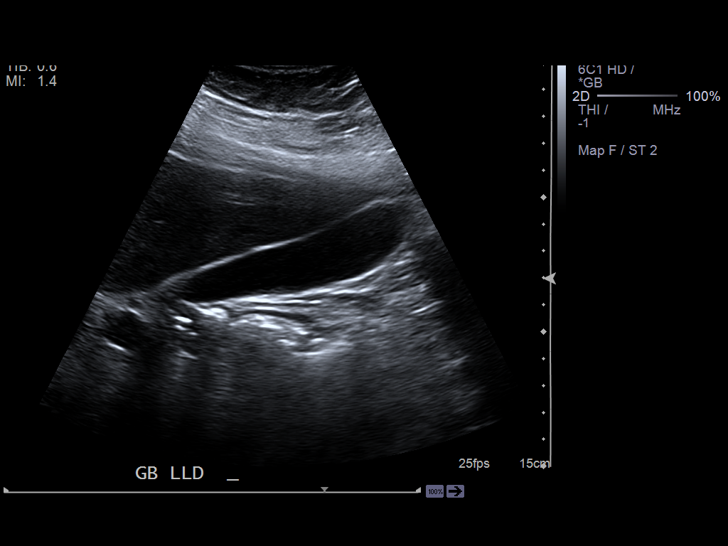
[im 46/55]
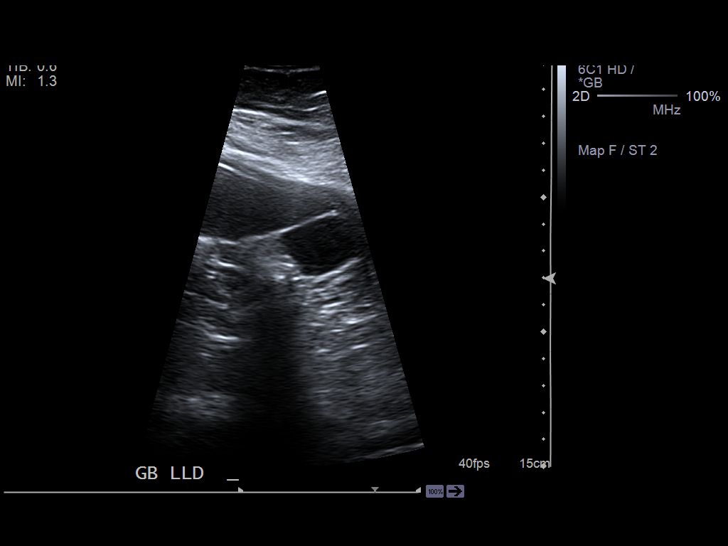
[im 50/55]
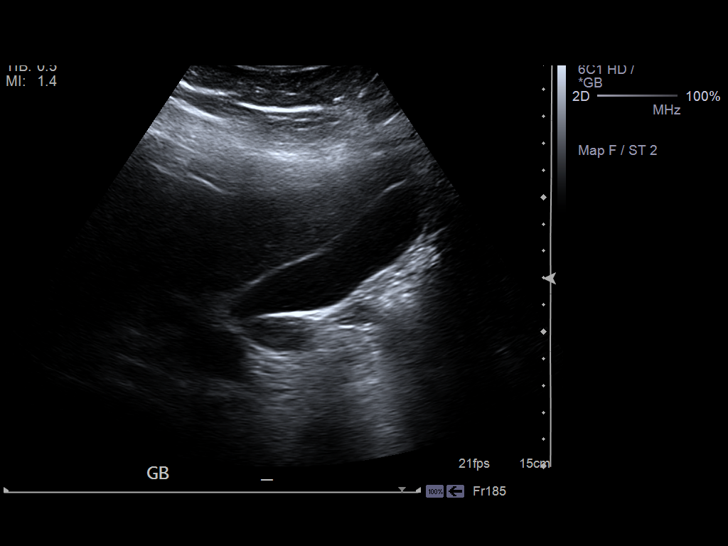
[im 55/55]
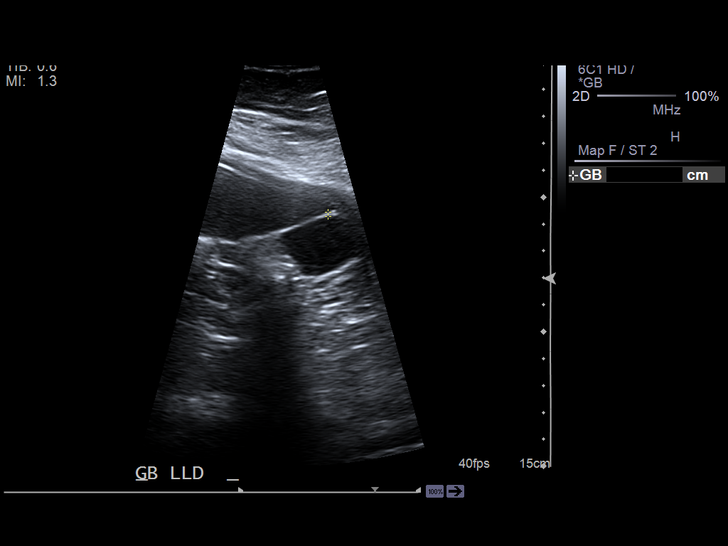

[14 of 25 positions shown; findings below may reference images not displayed]

PROCEDURE:     US  - US ABDOMEN LIMITED SURVEY  - [DATE]  [DATE]

RESULT:     Limited right upper quadrant abdominal sonogram is performed.
The patient has previous examination from [DATE].

The visualized pancreas appears to be grossly normal. The liver echotexture
is within normal limits. The length is 16.99 cm. The portal venous flow
appears normal. No gallstones are evident within the gallbladder. There is a
negative sonographic Murphy's sign. There is no pericholecystic fluid. The
common bile duct is 3.3 mm diameter. The gallbladder wall thickness is
mm.
IMPRESSION: 1. Unremarkable right upper quadrant abdominal sonogram. There is some
incomplete visualization of the pancreas because of overlying bowel gas.

[REDACTED]

## 2012-09-09 LAB — CBC WITH DIFFERENTIAL/PLATELET
Basophil %: 0.2 %
Eosinophil #: 0.1 10*3/uL (ref 0.0–0.7)
Eosinophil %: 1.8 %
HGB: 10.3 g/dL — ABNORMAL LOW (ref 12.0–16.0)
Lymphocyte %: 21.4 %
MCHC: 32.1 g/dL (ref 32.0–36.0)
MCV: 90 fL (ref 80–100)
Monocyte %: 5.8 %
Neutrophil #: 4.8 10*3/uL (ref 1.4–6.5)
Neutrophil %: 70.8 %
WBC: 6.8 10*3/uL (ref 3.6–11.0)

## 2012-09-09 LAB — BASIC METABOLIC PANEL
Anion Gap: 4 — ABNORMAL LOW (ref 7–16)
Calcium, Total: 8.9 mg/dL (ref 8.5–10.1)
Creatinine: 0.72 mg/dL (ref 0.60–1.30)
Glucose: 124 mg/dL — ABNORMAL HIGH (ref 65–99)
Osmolality: 282 (ref 275–301)
Potassium: 4 mmol/L (ref 3.5–5.1)
Sodium: 141 mmol/L (ref 136–145)

## 2012-09-09 LAB — LIPASE, BLOOD: Lipase: 301 U/L (ref 73–393)

## 2012-10-08 ENCOUNTER — Emergency Department: Payer: Self-pay | Admitting: Emergency Medicine

## 2012-10-08 LAB — CBC WITH DIFFERENTIAL/PLATELET
Basophil %: 0.4 %
HCT: 30 % — ABNORMAL LOW (ref 35.0–47.0)
HGB: 9.9 g/dL — ABNORMAL LOW (ref 12.0–16.0)
Lymphocyte %: 25.7 %
MCH: 29.2 pg (ref 26.0–34.0)
MCV: 89 fL (ref 80–100)
Monocyte #: 0.6 x10 3/mm (ref 0.2–0.9)
Neutrophil %: 65.5 %
Platelet: 318 10*3/uL (ref 150–440)
RBC: 3.39 10*6/uL — ABNORMAL LOW (ref 3.80–5.20)
RDW: 14 % (ref 11.5–14.5)
WBC: 9.7 10*3/uL (ref 3.6–11.0)

## 2012-10-08 LAB — COMPREHENSIVE METABOLIC PANEL
Alkaline Phosphatase: 79 U/L (ref 50–136)
Anion Gap: 7 (ref 7–16)
Calcium, Total: 9.2 mg/dL (ref 8.5–10.1)
EGFR (Non-African Amer.): 60 — ABNORMAL LOW
Glucose: 159 mg/dL — ABNORMAL HIGH (ref 65–99)
Osmolality: 281 (ref 275–301)
Potassium: 3.3 mmol/L — ABNORMAL LOW (ref 3.5–5.1)
SGPT (ALT): 18 U/L (ref 12–78)
Total Protein: 6.9 g/dL (ref 6.4–8.2)

## 2012-10-08 LAB — TROPONIN I
Troponin-I: <0.02 ng/mL
Troponin-I: <0.02 ng/mL

## 2012-10-08 IMAGING — CR DG CHEST 2V
1 series · 2 of 2 positions shown · non-contrast
Comparison: none

REASON FOR EXAM: chest pain
COMMENTS:

[Series 2: x chest ap · 0.14mm/px · 2 of 2 slices shown]
[im 1/2]
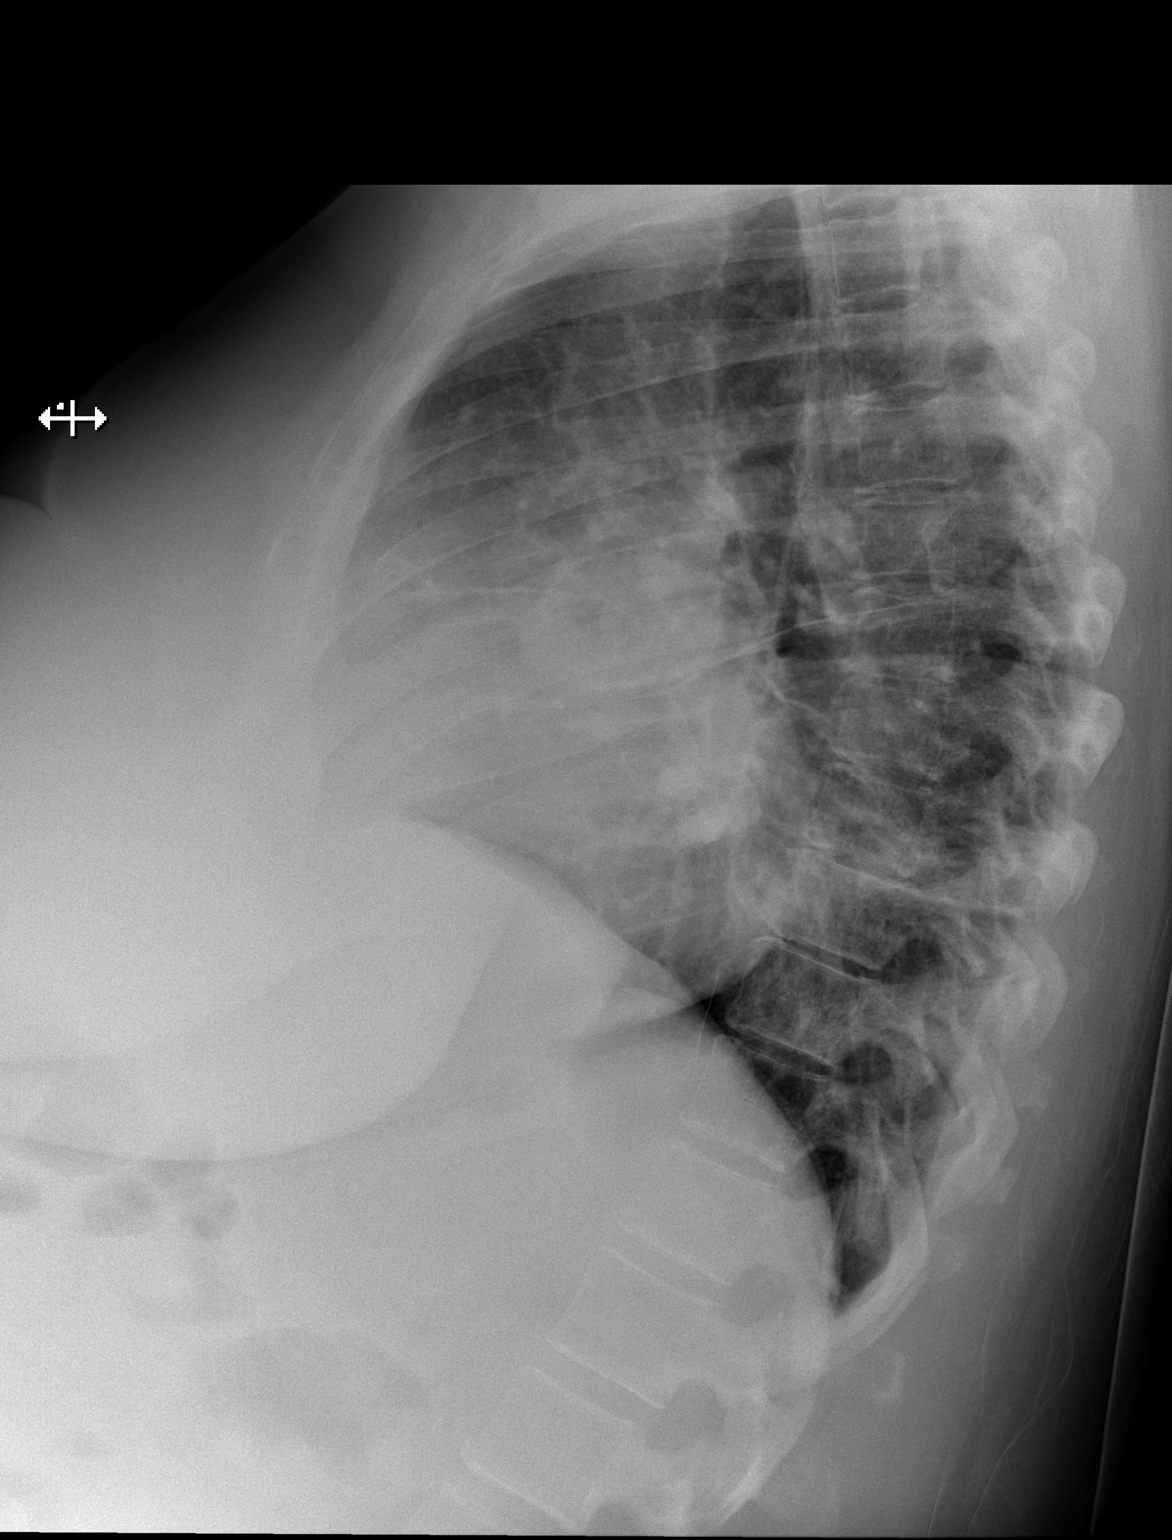
[im 2/2]
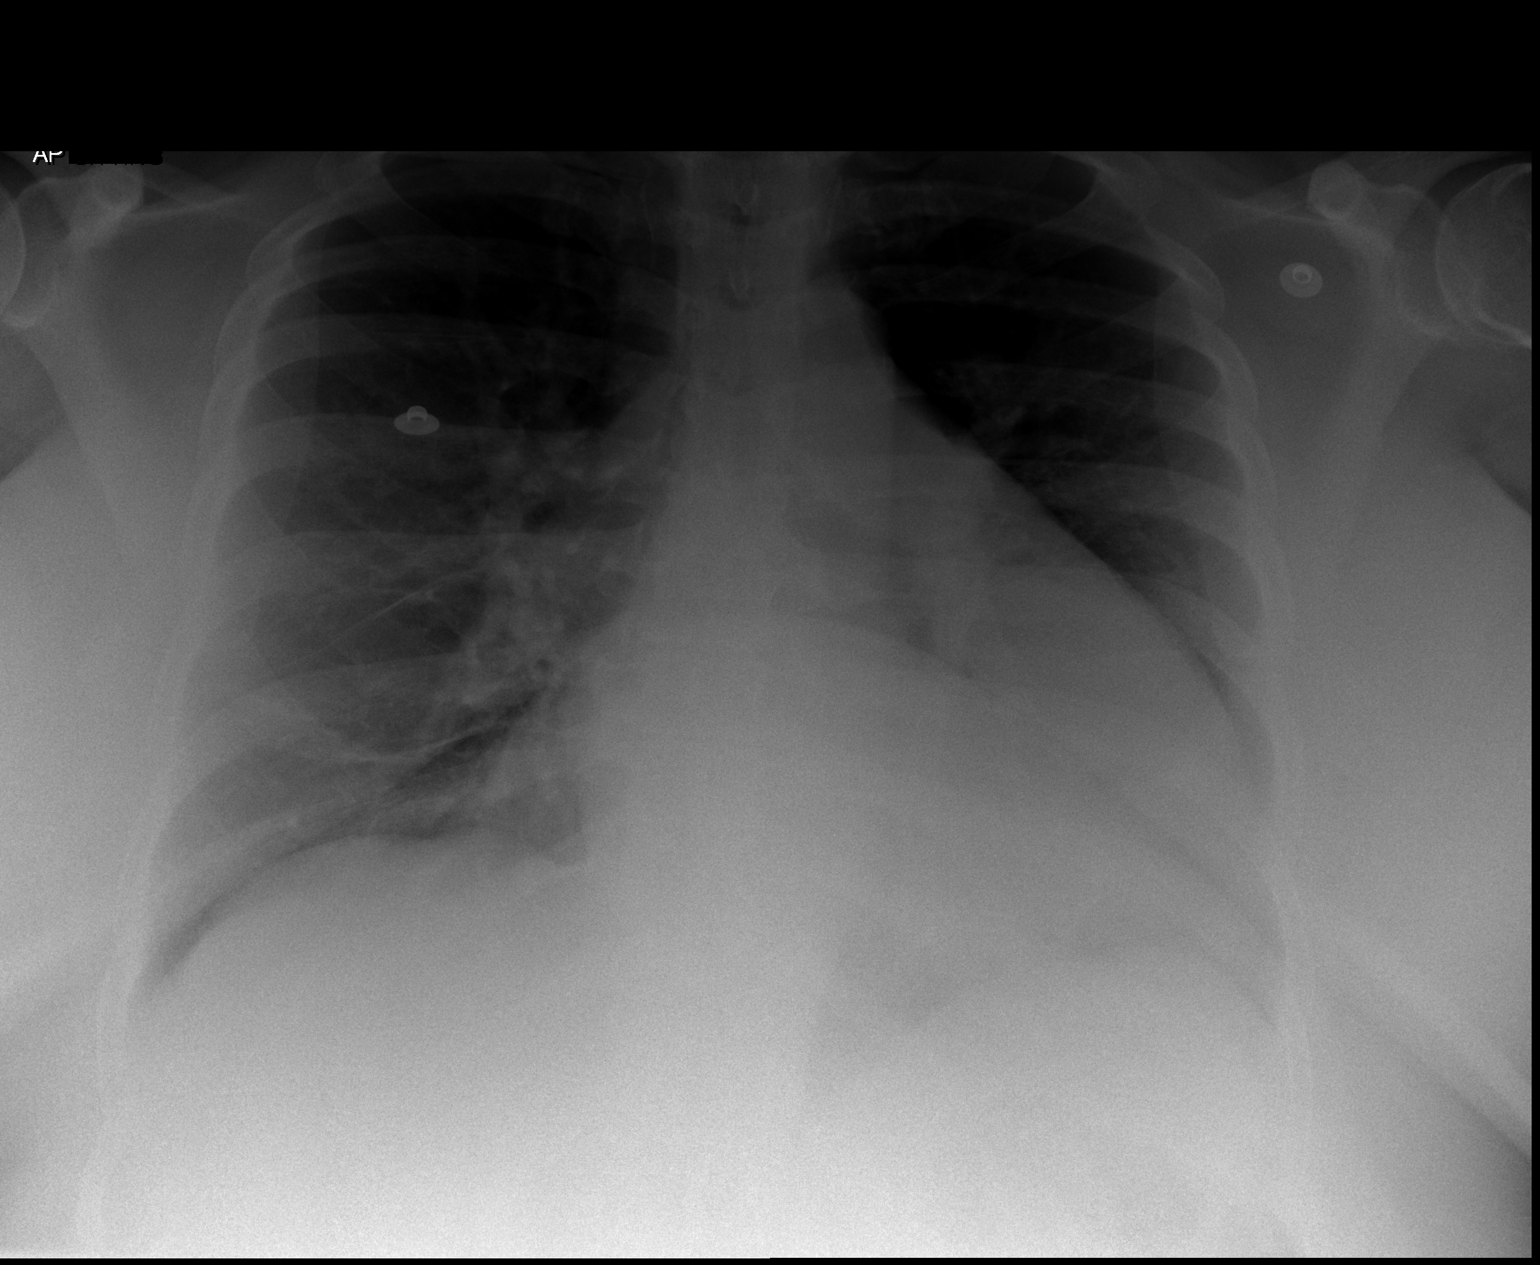

[2 of 2 positions shown; findings below may reference images not displayed]

PROCEDURE:     DXR - DXR CHEST PA (OR AP) AND LATERAL  - [DATE]  [DATE]

RESULT:     Comparison is made to a study [DATE].

The cardiac silhouette remains enlarged. The pulmonary vascularity is
prominent centrally. There is persistent increased density in the right
infrahilar region which may reflect subsegmental atelectasis or fluid in the
minor fissure. The left hemidiaphragm is slightly less well demonstrated
today.
IMPRESSION: The findings suggest low-grade compensated CHF. I cannot
exclude a pericardial effusion in the appropriate clinical setting. Followup
chest CT scanning may be useful if the patient's clinical picture is
unclear.

[REDACTED]

## 2012-10-27 ENCOUNTER — Emergency Department: Payer: Self-pay | Admitting: Internal Medicine

## 2012-10-27 LAB — CBC
HCT: 32.7 % — ABNORMAL LOW (ref 35.0–47.0)
HGB: 10.5 g/dL — ABNORMAL LOW (ref 12.0–16.0)
MCV: 90 fL (ref 80–100)
Platelet: 335 10*3/uL (ref 150–440)
RDW: 14.3 % (ref 11.5–14.5)

## 2012-10-27 LAB — URINALYSIS, COMPLETE
Bilirubin,UR: NEGATIVE
Blood: NEGATIVE
Nitrite: NEGATIVE
RBC,UR: <1 /HPF (ref 0–5)
Squamous Epithelial: 2
WBC UR: 1 /HPF (ref 0–5)

## 2012-10-27 LAB — BASIC METABOLIC PANEL
Anion Gap: 7 (ref 7–16)
Calcium, Total: 9.5 mg/dL (ref 8.5–10.1)
Chloride: 103 mmol/L (ref 98–107)
Co2: 30 mmol/L (ref 21–32)
Creatinine: 0.92 mg/dL (ref 0.60–1.30)
Osmolality: 282 (ref 275–301)
Potassium: 3.9 mmol/L (ref 3.5–5.1)

## 2012-10-27 IMAGING — CT CT ABD-PELV W/O CM
1 of 2 series · 15 of 32 positions shown, 19 images · non-contrast
Comparison: [DATE], [DATE]

REASON FOR EXAM: (1) flank and back pain; (2) flank and back pain
COMMENTS:

PROCEDURE:     CT  - CT ABDOMEN AND PELVIS W[DATE]  [DATE]
RESULT:     Indication: Flank Pain
TECHNIQUE: Multiple axial images from the lung bases to the symphysis pubis
were obtained without oral and without intravenous contrast.

[Series 2: 3mm soft tissue · axial · 0.77mm/px · z∈[-642,-204]mm · 15 of 160 slices shown, 19 images]
[im 7/160  soft-tissue]
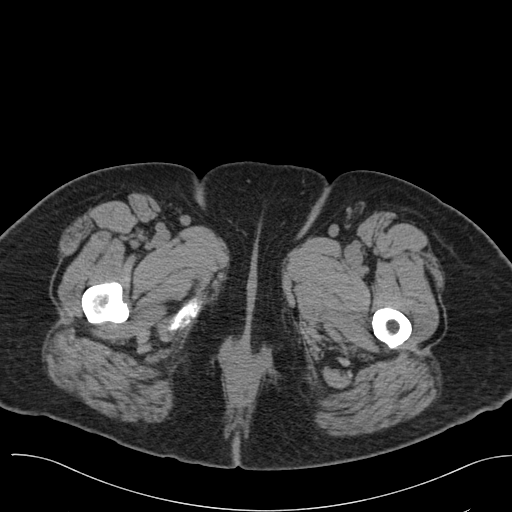
[im 7/160  bone]
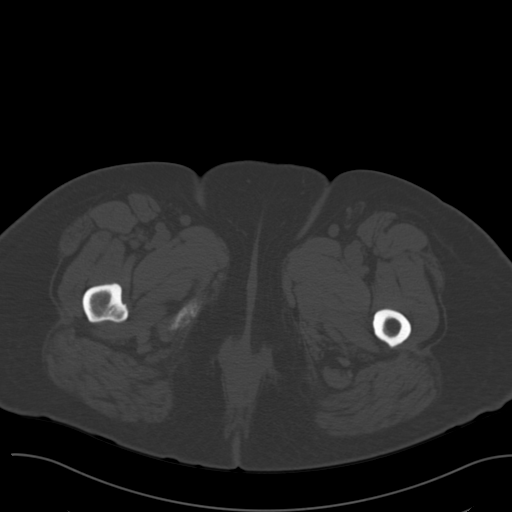
[im 20/160  soft-tissue]
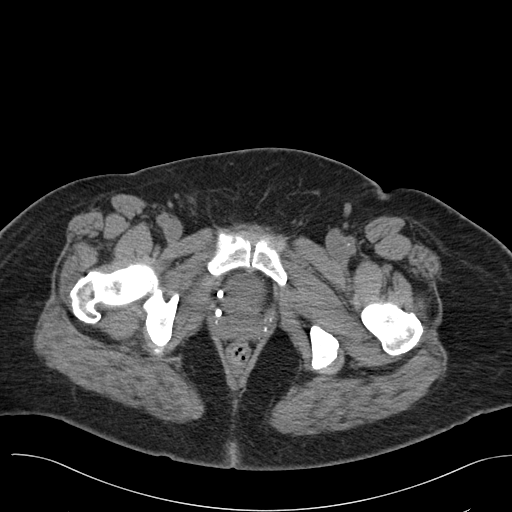
[im 32/160  soft-tissue]
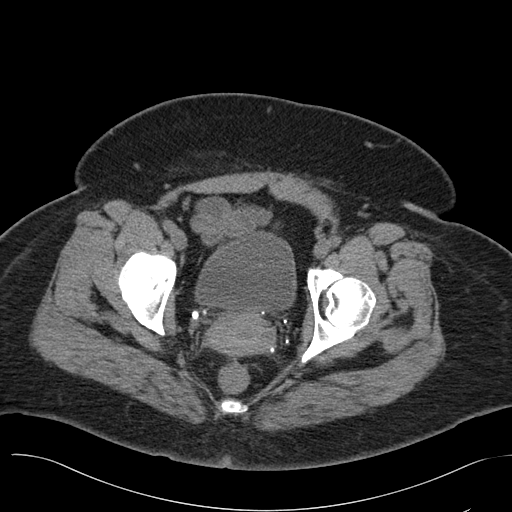
[im 45/160  soft-tissue]
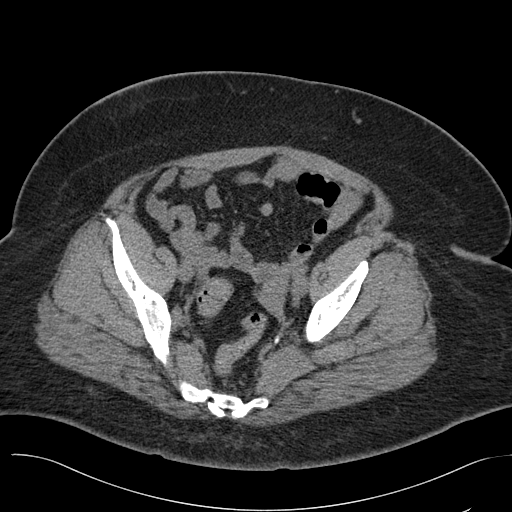
[im 58/160  soft-tissue]
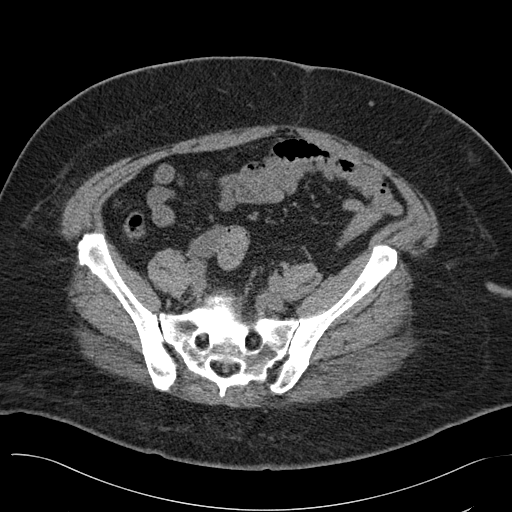
[im 70/160  soft-tissue]
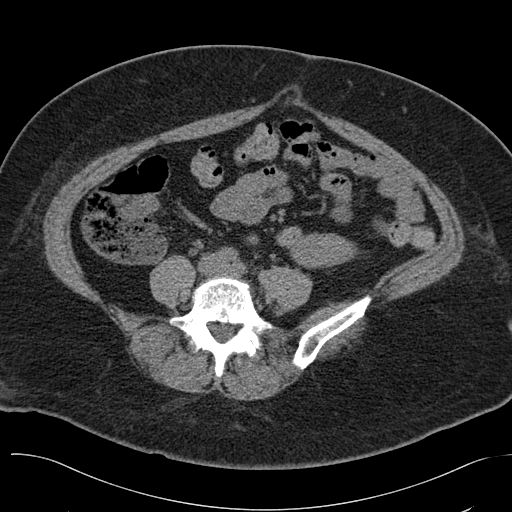
[im 83/160  soft-tissue]
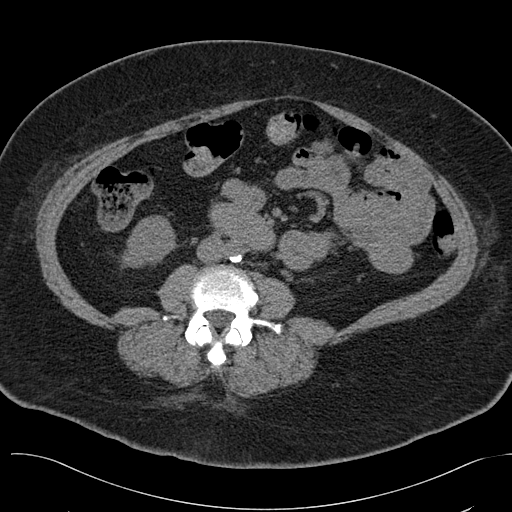
[im 90/160  soft-tissue]
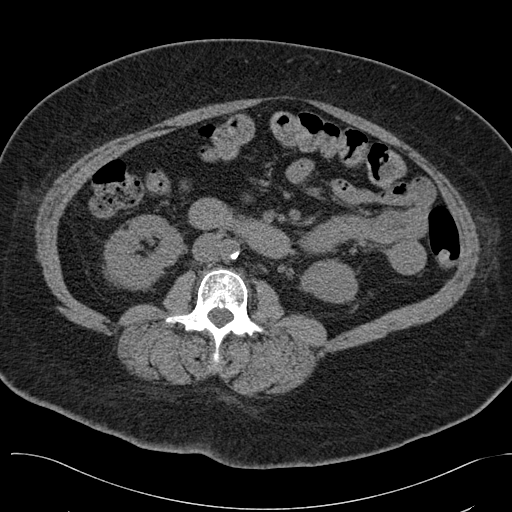
[im 102/160  soft-tissue]
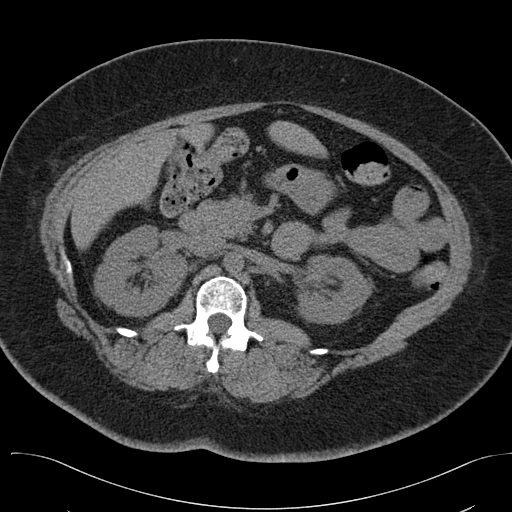
[im 102/160  bone]
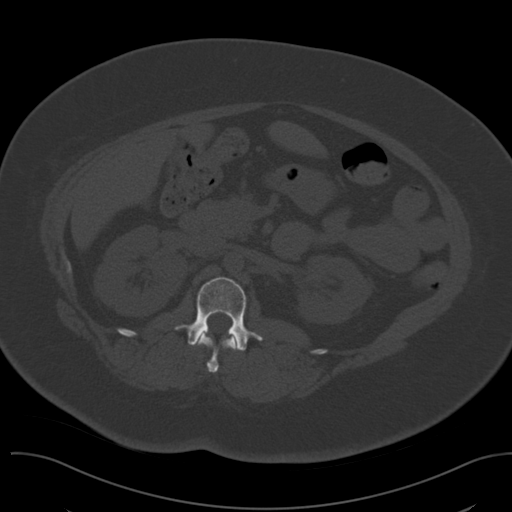
[im 115/160  soft-tissue]
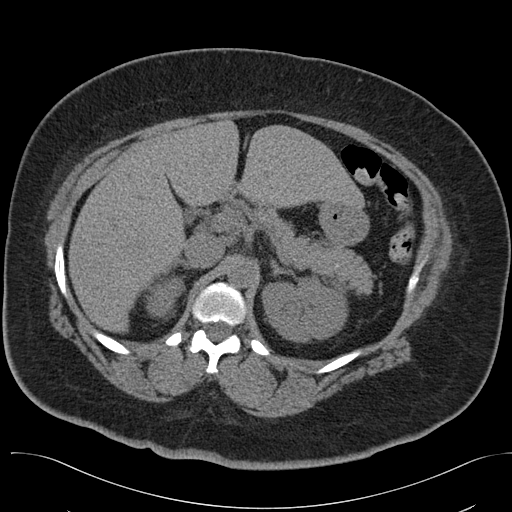
[im 128/160  soft-tissue]
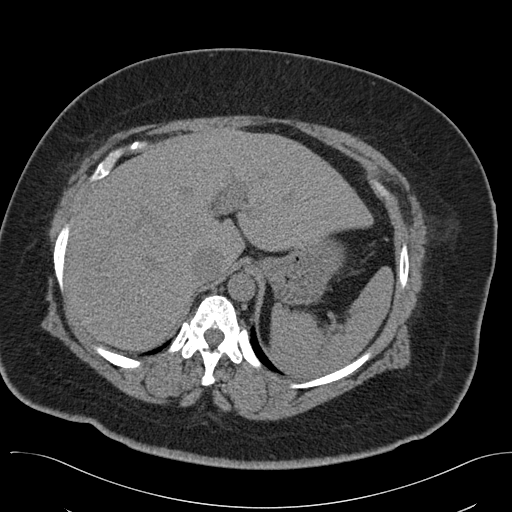
[im 134/160  lung]
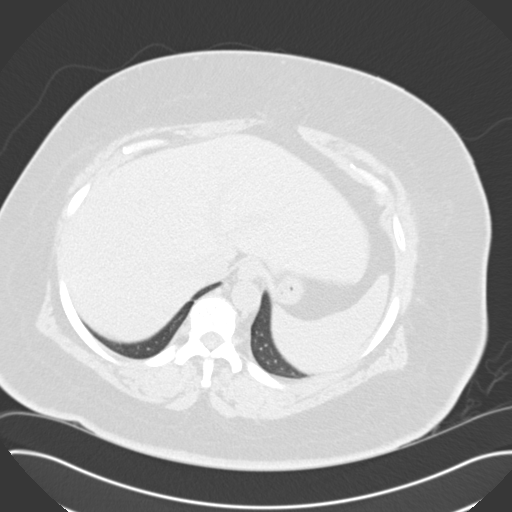
[im 140/160  soft-tissue]
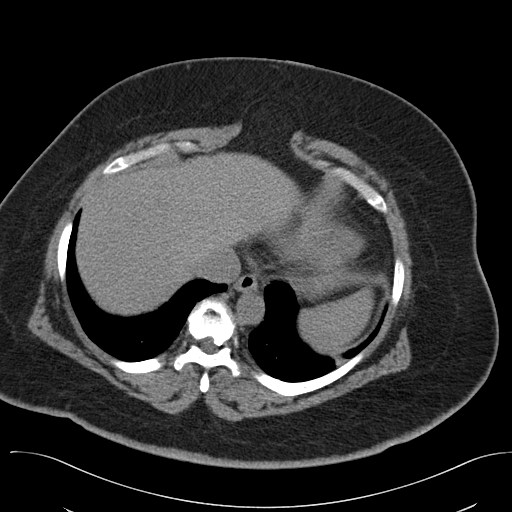
[im 140/160  lung]
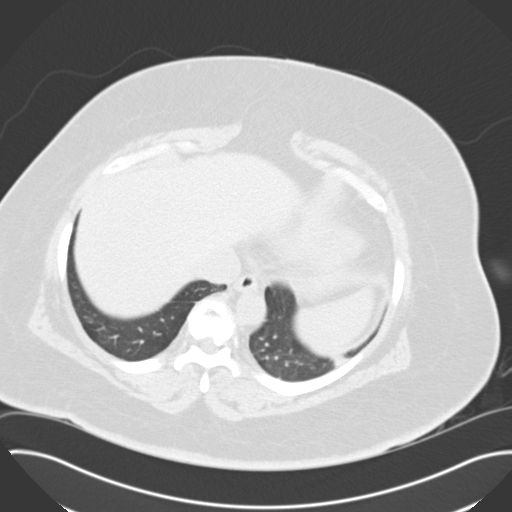
[im 147/160  lung]
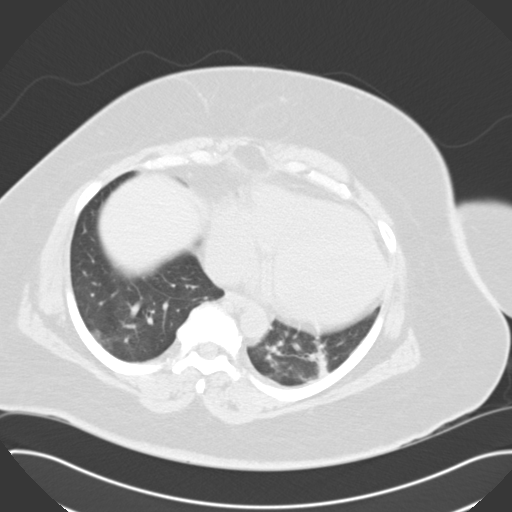
[im 153/160  soft-tissue]
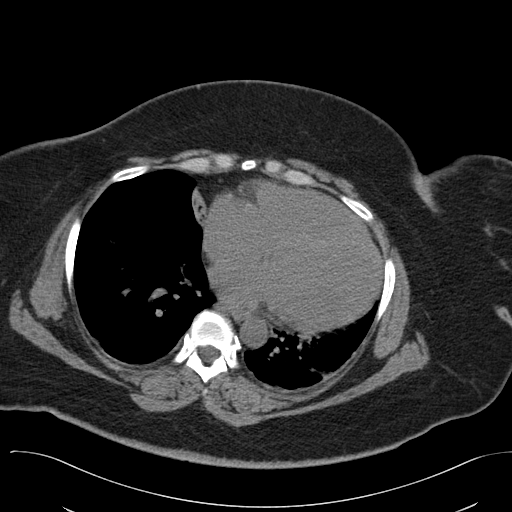
[im 153/160  lung]
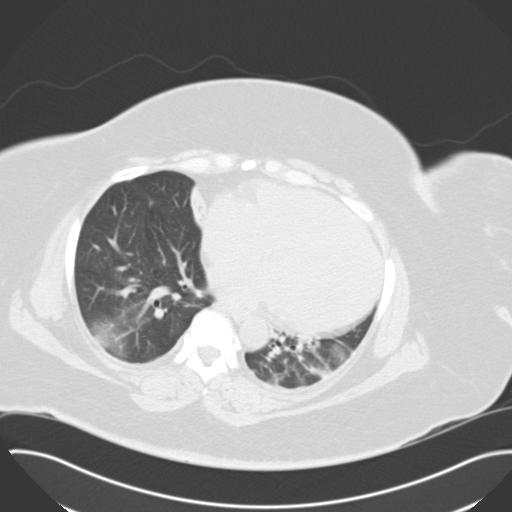

[15 of 32 positions shown; findings below may reference images not displayed]

FINDINGS: The lung bases are clear. There is no pleural or pericardial effusions. The
heart size is enlarged.

No renal, ureteral, or bladder calculi. No obstructive uropathy. No
perinephric stranding is seen. The kidneys are symmetric in size without
evidence for exophytic mass. The bladder is unremarkable.

The liver demonstrates no focal abnormality. The gallbladder is
unremarkable. The spleen demonstrates no focal abnormality. The adrenal
glands and pancreas are normal.

The unopacified stomach, duodenum, small intestine, and large intestine are
unremarkable, but evaluation is limited by lack of oral contrast. There is a
fat-containing umbilical hernia. There is a normal caliber appendix in the
right lower quadrant without periappendiceal inflammatory changes. There is
no pneumoperitoneum, pneumatosis, or portal venous gas. There is no
abdominal or pelvic free fluid. There is no lymphadenopathy.

The abdominal aorta is normal in caliber with atherosclerosis.

There is degenerative disc disease at L5-S1.
IMPRESSION: 1. No urolithiasis or obstructive uropathy.

[REDACTED]

## 2012-11-01 ENCOUNTER — Ambulatory Visit: Payer: Self-pay | Admitting: Specialist

## 2012-11-01 IMAGING — CT CT CHEST W/O CM
1 of 2 series · 14 of 32 positions shown, 18 images · non-contrast
Comparison: [DATE]

REASON FOR EXAM: Pulmonary nodule
COMMENTS:

PROCEDURE:     KCT - KCT CHEST WITHOUT CONTRAST  - [DATE] [DATE]
RESULT:     Indication: Pulmonary nodule
TECHNIQUE: Multiple axial images of the chest are obtained without
intravenous contrast.

[Series 2: chest w/o 3.0 i31f 2 · axial · non-contrast · 0.69mm/px · z∈[-714,-458]mm · 14 of 101 slices shown, 18 images]
[im 8/101  mediastinal]
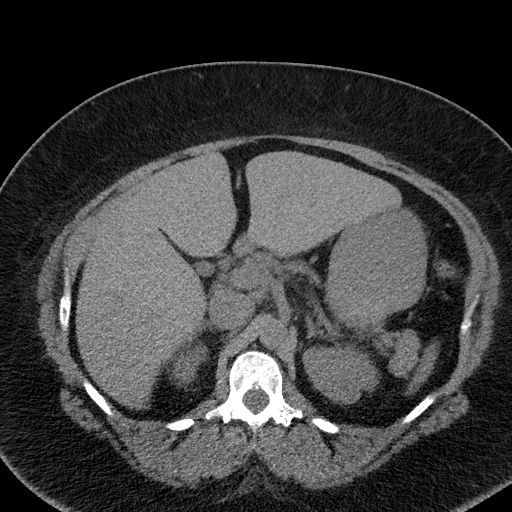
[im 8/101  lung]
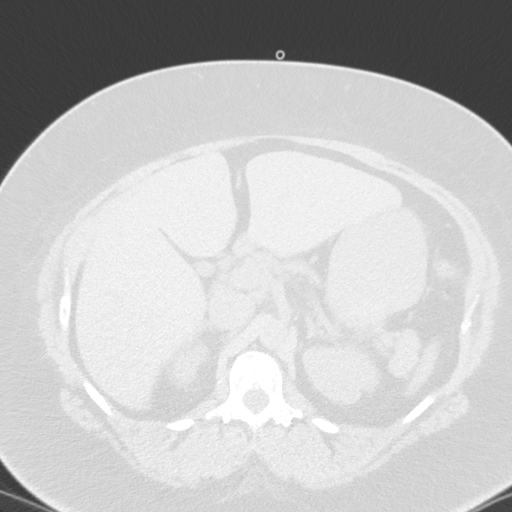
[im 16/101  lung]
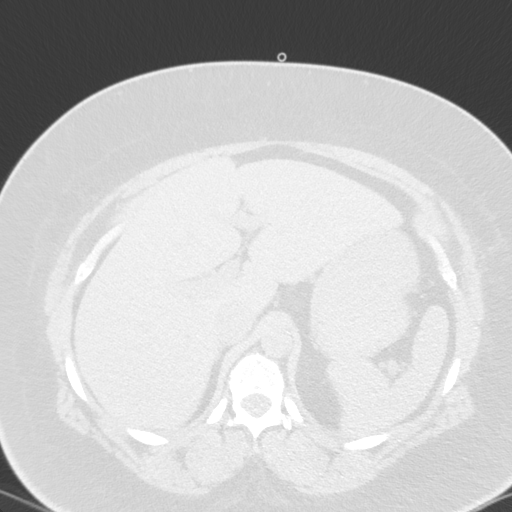
[im 24/101  lung]
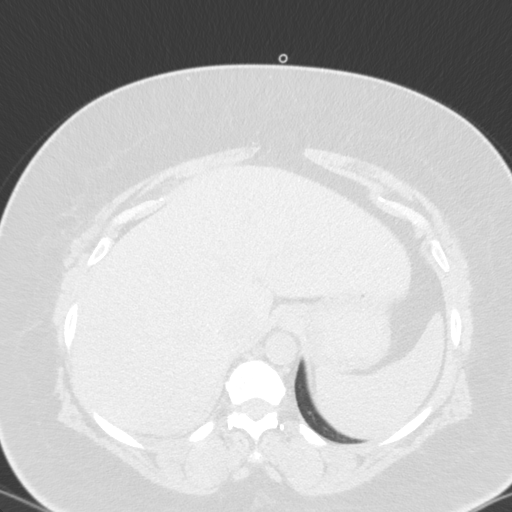
[im 31/101  lung]
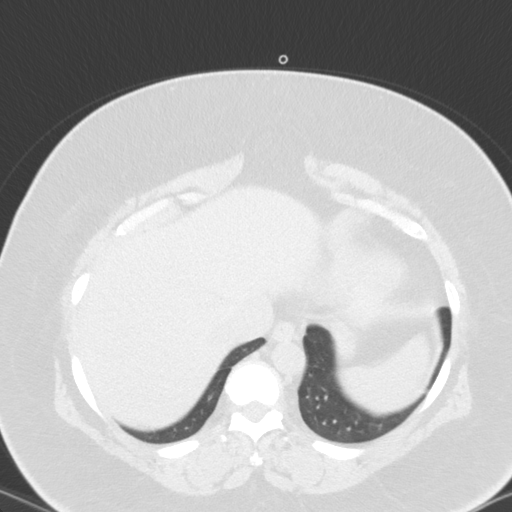
[im 39/101  mediastinal]
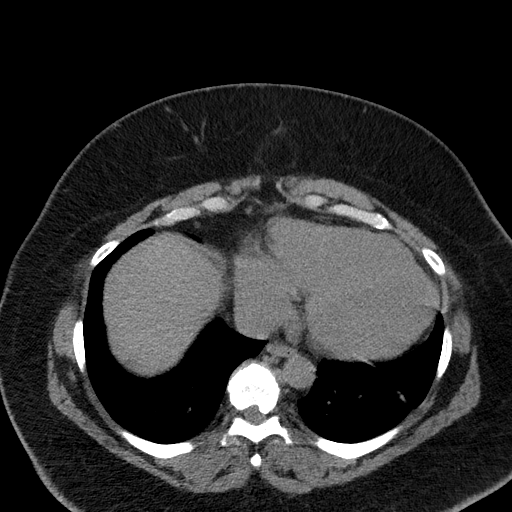
[im 39/101  lung]
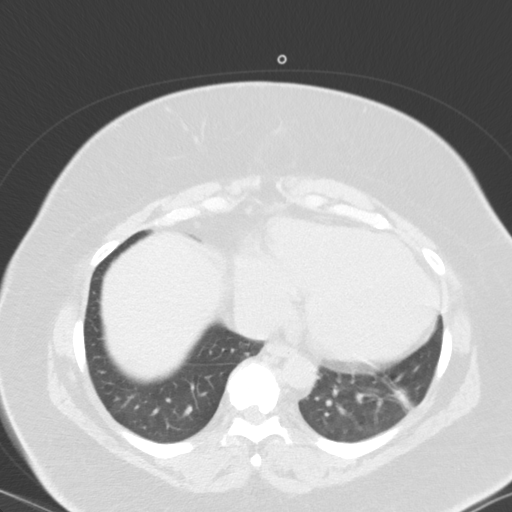
[im 47/101  lung]
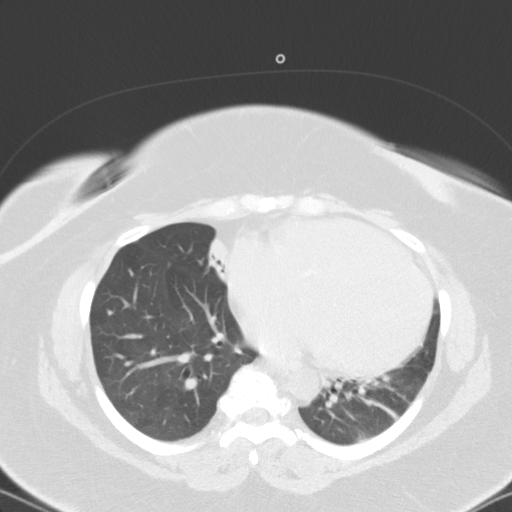
[im 48/101  lung]
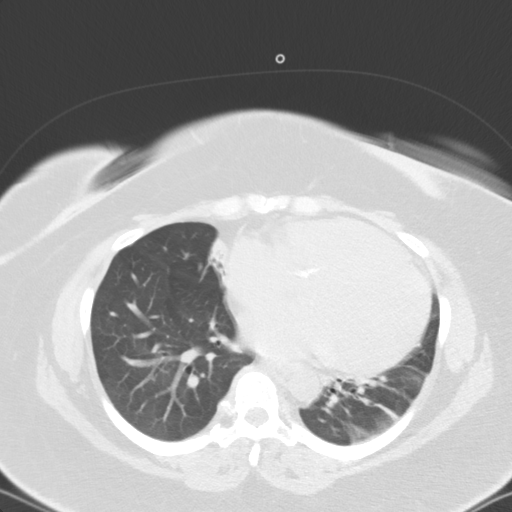
[im 51/101  lung]
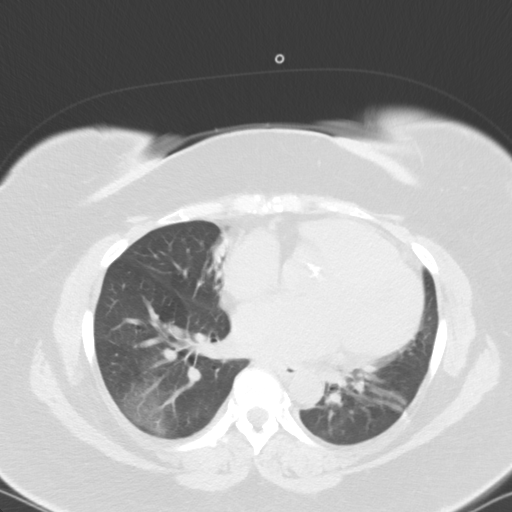
[im 54/101  mediastinal]
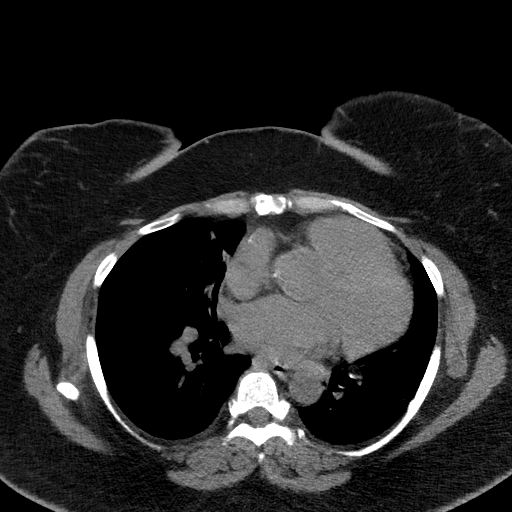
[im 54/101  lung]
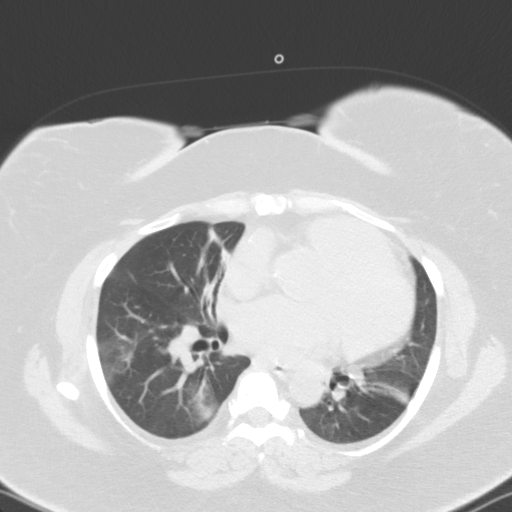
[im 62/101  lung]
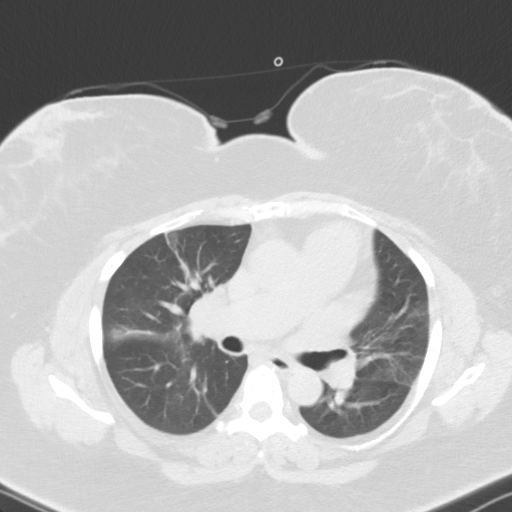
[im 70/101  lung]
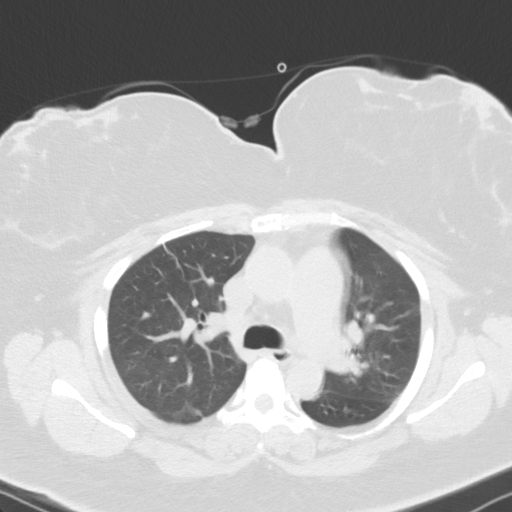
[im 77/101  lung]
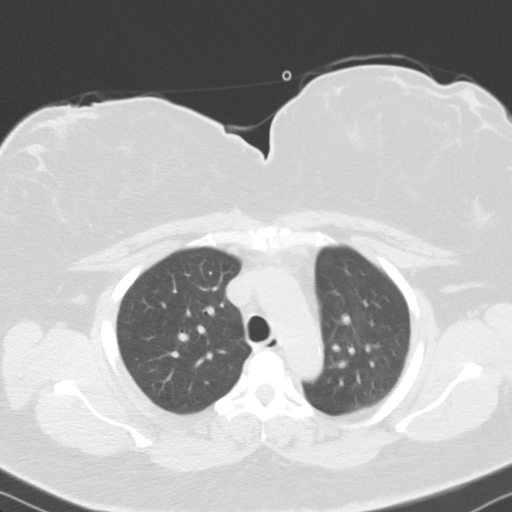
[im 85/101  mediastinal]
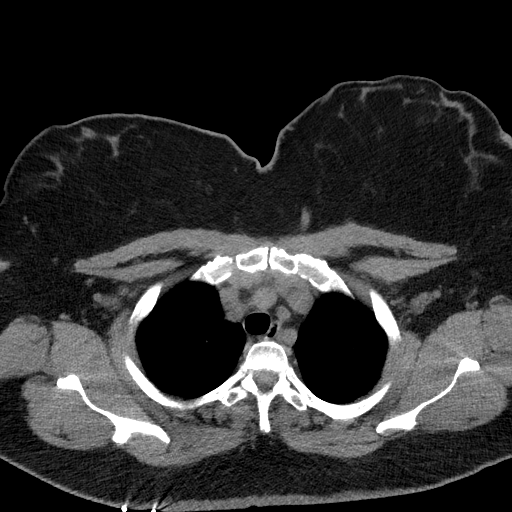
[im 85/101  lung]
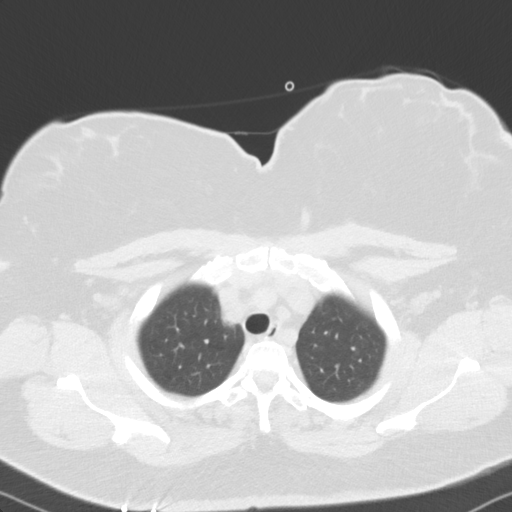
[im 93/101  lung]
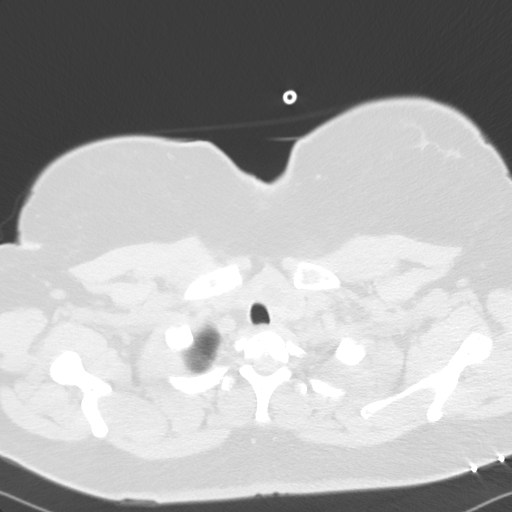

[14 of 32 positions shown; findings below may reference images not displayed]

FINDINGS: The central airways are patent. There is a linear band of airspace disease
in the left lower lobe likely representing scarring versus atelectasis.
There is a focal area of atelectasis in the right middle lobe. There is no
pleural effusion or pneumothorax.

There are no pathologically enlarged axillary, hilar, or mediastinal lymph
nodes.

The heart size is enlarged. There is no pericardial effusion. The thoracic
aorta is normal in caliber.

Review of bone windows demonstrates no focal lytic or sclerotic lesions.

Limited noncontrast images of the upper abdomen were obtained. The adrenal
glands appear normal. The remainder of the visualized abdominal organs are
unremarkable.
IMPRESSION: 1. Interval resolution of small focal area of groundglass opacity. There is
left basilar atelectasis versus scarring.

[REDACTED]

## 2012-12-10 ENCOUNTER — Emergency Department: Payer: Self-pay | Admitting: Emergency Medicine

## 2012-12-10 LAB — COMPREHENSIVE METABOLIC PANEL
Alkaline Phosphatase: 87 U/L (ref 50–136)
Anion Gap: 4 — ABNORMAL LOW (ref 7–16)
BUN: 11 mg/dL (ref 7–18)
Bilirubin,Total: 0.2 mg/dL (ref 0.2–1.0)
Chloride: 103 mmol/L (ref 98–107)
Co2: 32 mmol/L (ref 21–32)
EGFR (African American): >60
EGFR (Non-African Amer.): >60
Osmolality: 281 (ref 275–301)
Potassium: 3.9 mmol/L (ref 3.5–5.1)
Sodium: 139 mmol/L (ref 136–145)
Total Protein: 7.2 g/dL (ref 6.4–8.2)

## 2012-12-10 LAB — CBC
HCT: 31.4 % — ABNORMAL LOW (ref 35.0–47.0)
HGB: 10.3 g/dL — ABNORMAL LOW (ref 12.0–16.0)
MCH: 29.2 pg (ref 26.0–34.0)
MCHC: 32.9 g/dL (ref 32.0–36.0)
MCV: 89 fL (ref 80–100)
Platelet: 354 10*3/uL (ref 150–440)
RDW: 14.1 % (ref 11.5–14.5)
WBC: 8.3 10*3/uL (ref 3.6–11.0)

## 2012-12-17 ENCOUNTER — Ambulatory Visit: Payer: Self-pay | Admitting: Specialist

## 2013-01-24 ENCOUNTER — Emergency Department: Payer: Self-pay | Admitting: Emergency Medicine

## 2013-01-24 IMAGING — CR DG KNEE COMPLETE 4+V*R*
1 series · 5 of 5 positions shown · non-contrast
Comparison: none

REASON FOR EXAM: pain - R knee - hx of injury remotely but without
evaluation
COMMENTS:

PROCEDURE:     DXR - DXR KNEE RT COMP WITH OBLIQUES  - [DATE]  [DATE]
RESULT:     Degenerative change noted about the right knee. Tiny bony
density noted adjacent to the medial tibial spine. This may represent site
of old fracture. This appears corticated.

[Series 1: ap · 0.17mm/px · 5 of 5 slices shown]
[im 1/5]
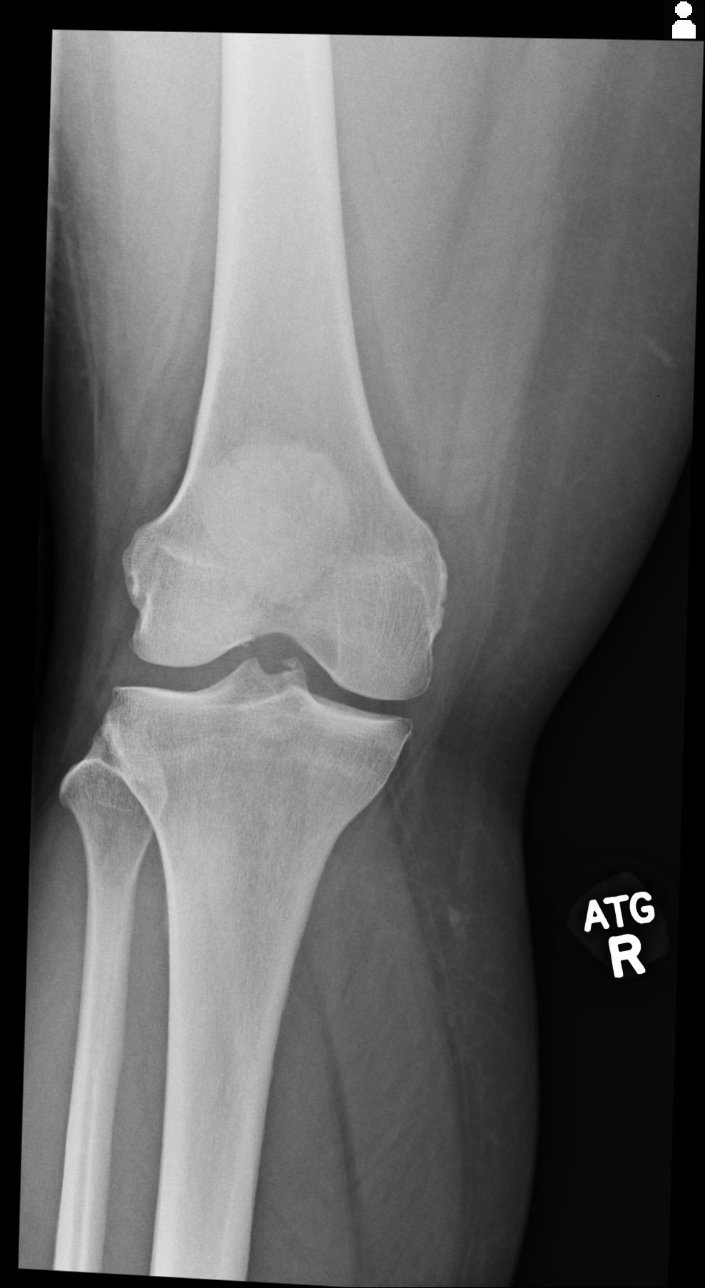
[im 2/5]
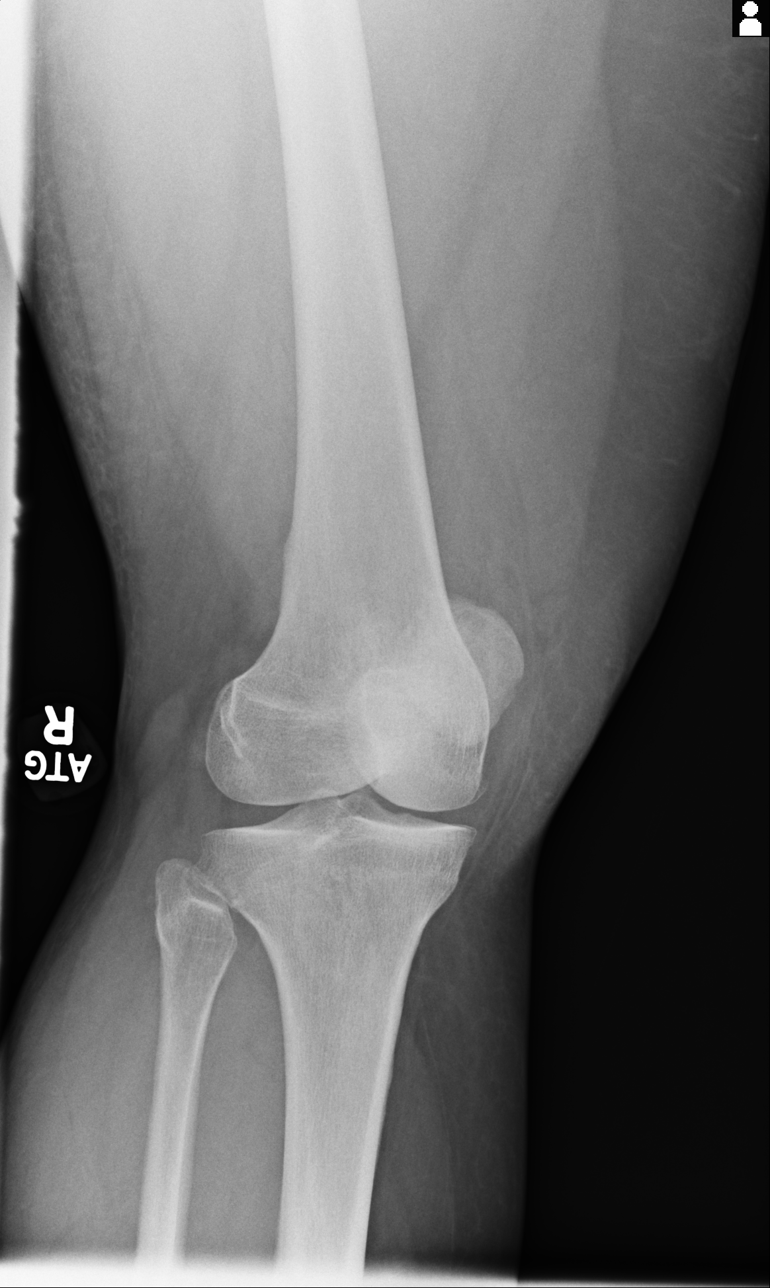
[im 3/5]
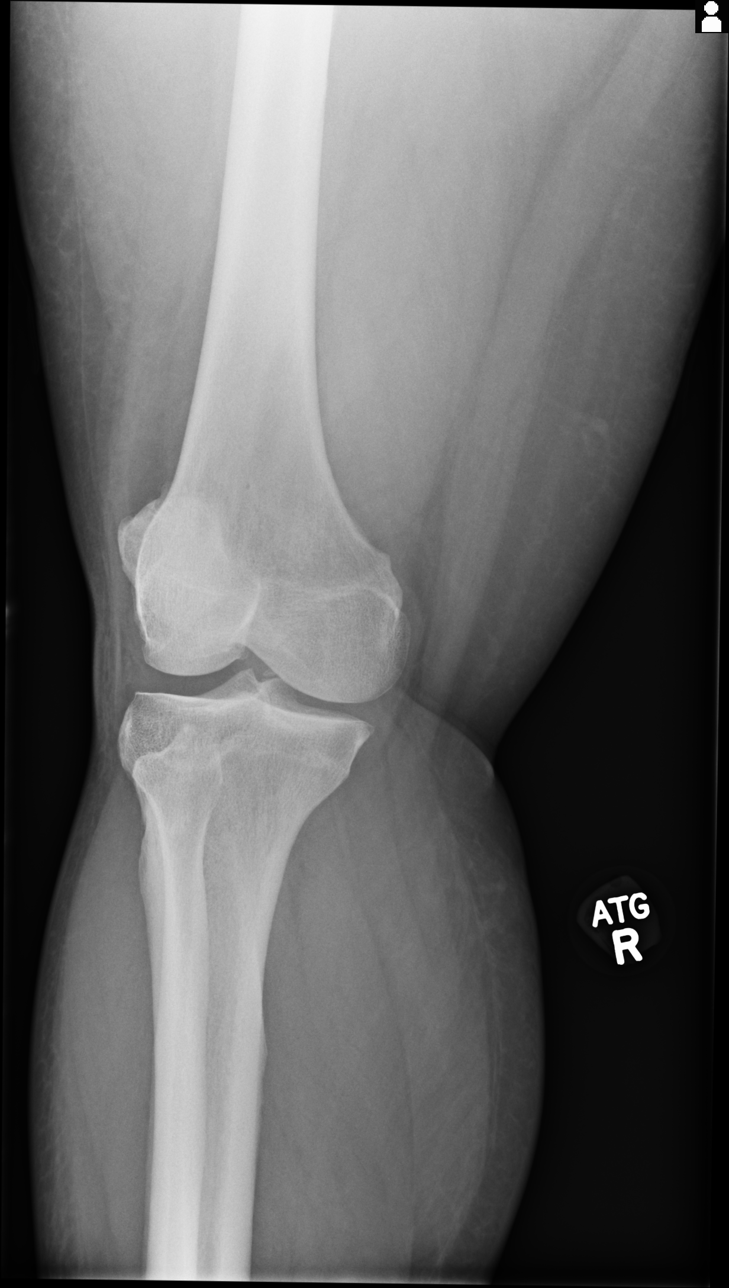
[im 4/5]
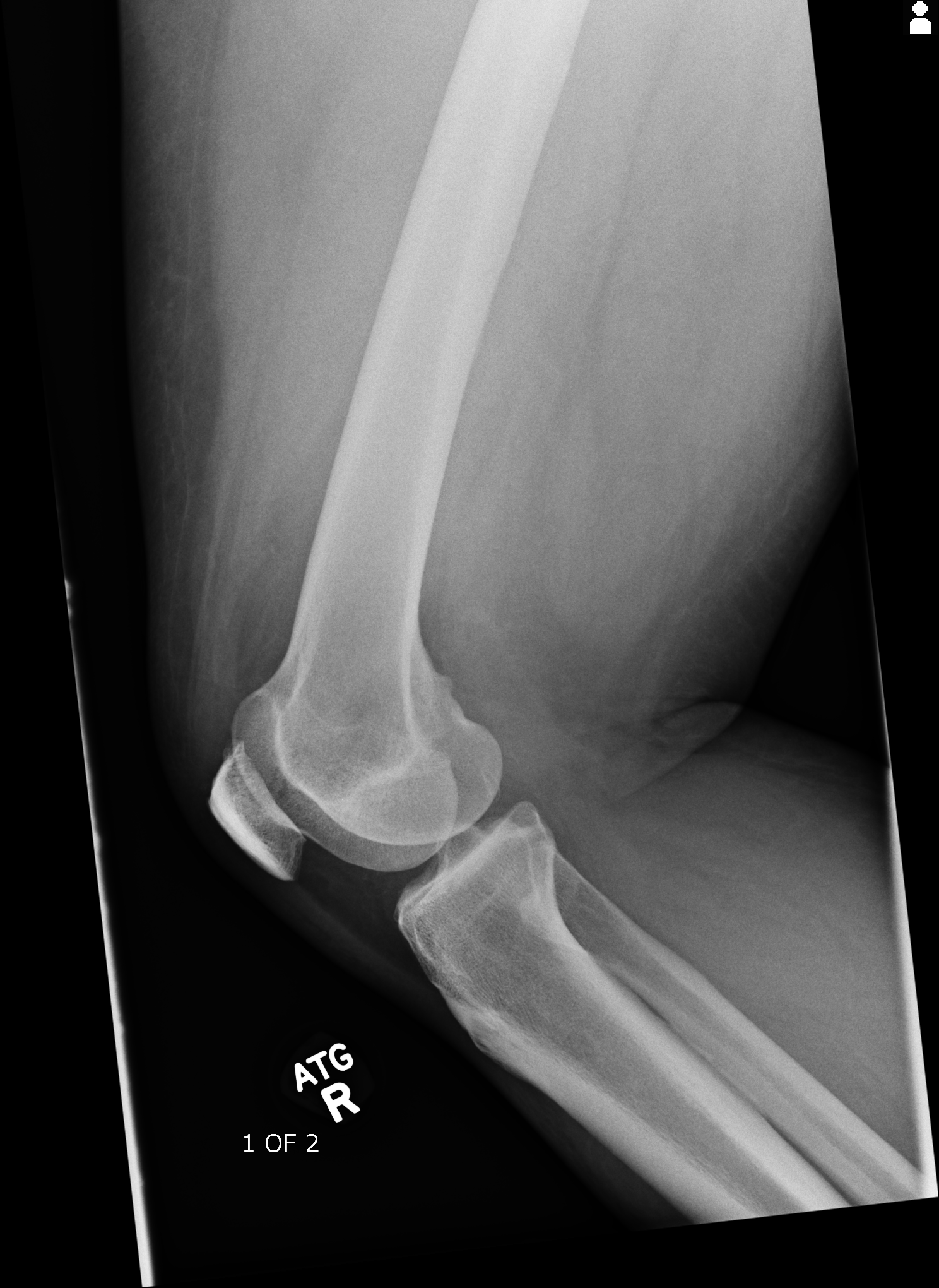
[im 5/5]
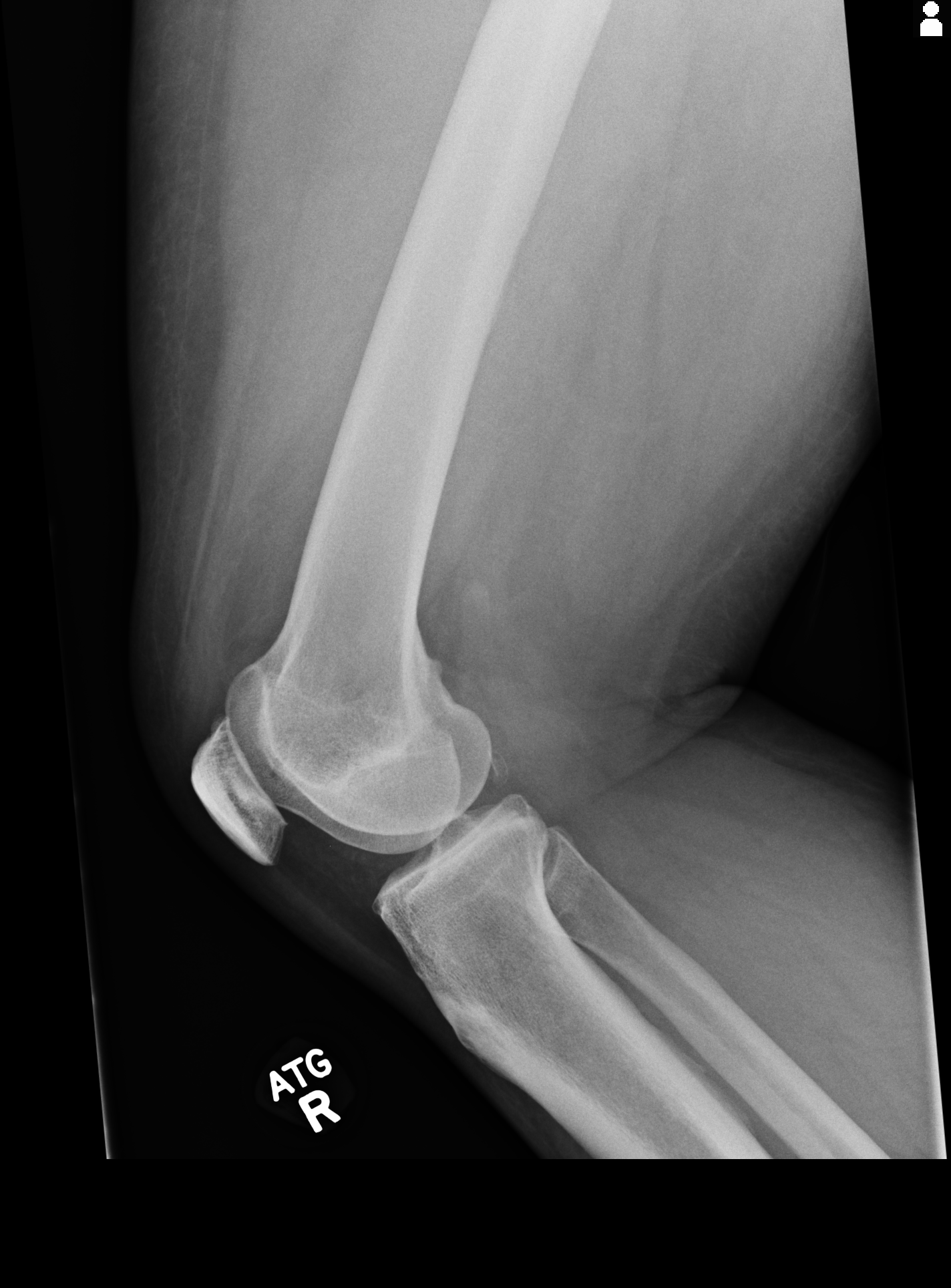

[5 of 5 positions shown; findings below may reference images not displayed]

IMPRESSION: No acute abnormality. Degenerative change. Tiny old
fracture of the medial tibial spine.

## 2013-01-25 ENCOUNTER — Emergency Department: Payer: Self-pay | Admitting: Emergency Medicine

## 2013-01-31 ENCOUNTER — Ambulatory Visit: Payer: Self-pay | Admitting: Family Medicine

## 2013-01-31 IMAGING — MG MM DIGITAL SCREENING BILAT W/ CAD
1 series · 5 of 5 positions shown · non-contrast
Comparison: Previous exam(s).

CLINICAL DATA: Screening.

EXAM:
DIGITAL SCREENING BILATERAL MAMMOGRAM WITH CAD

[R CC · right · 5 of 5 slices shown]
[im 1/5]
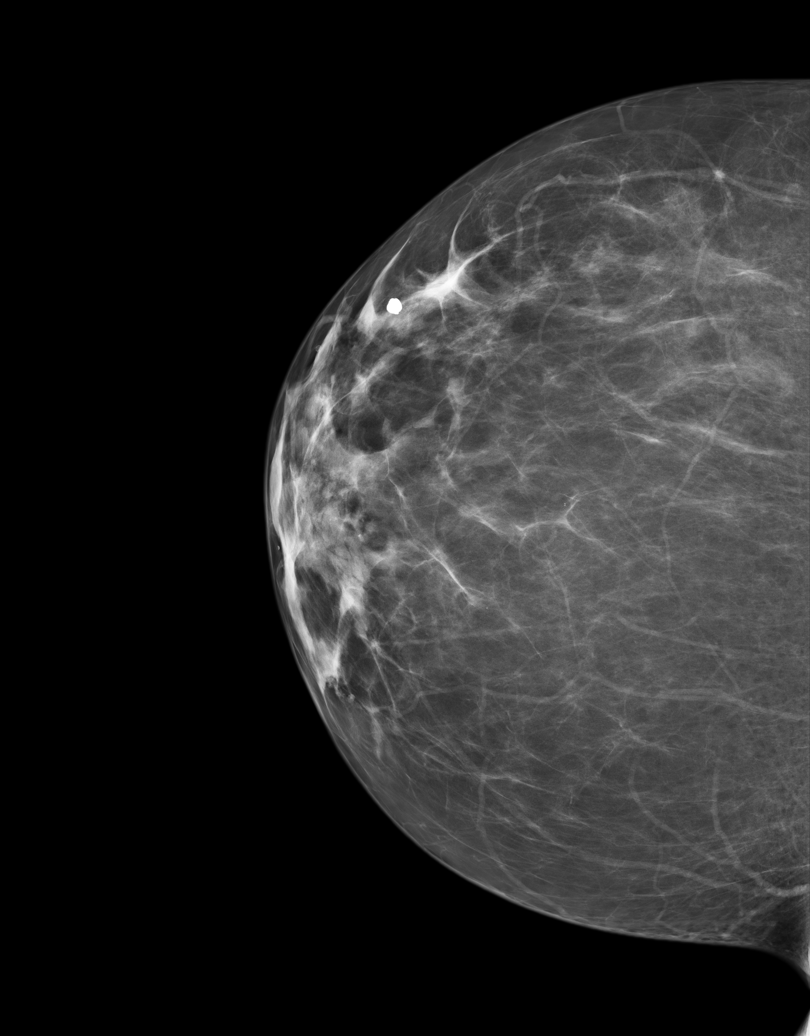
[im 2/5]
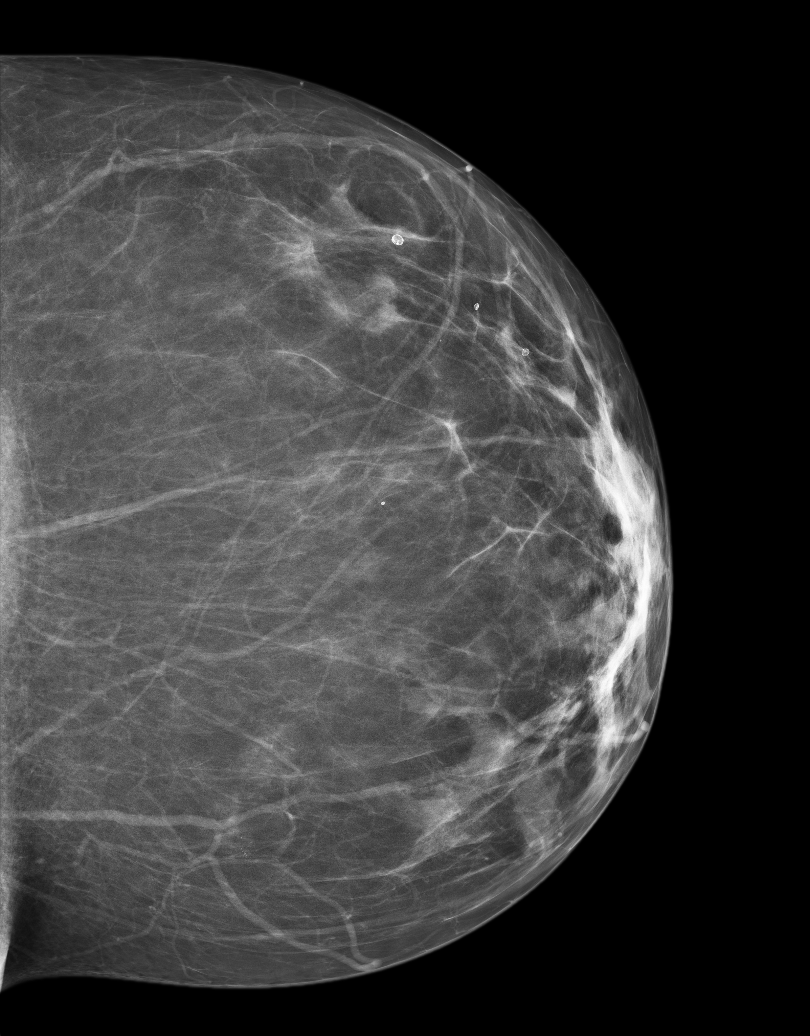
[im 3/5]
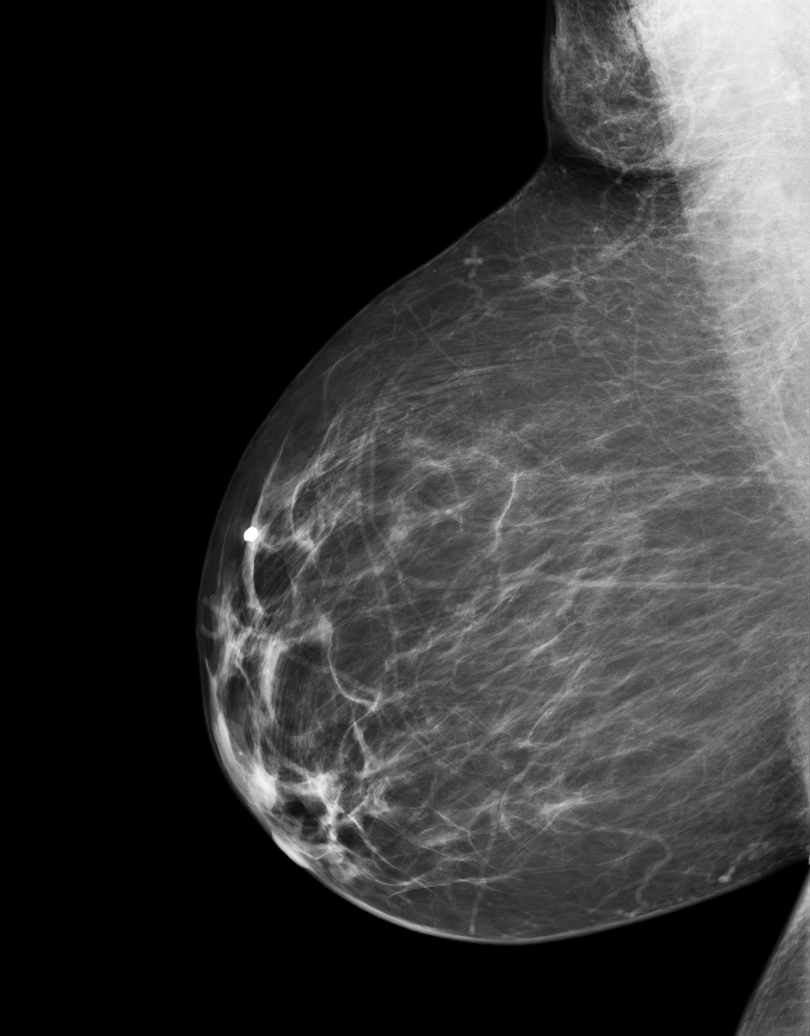
[im 4/5]
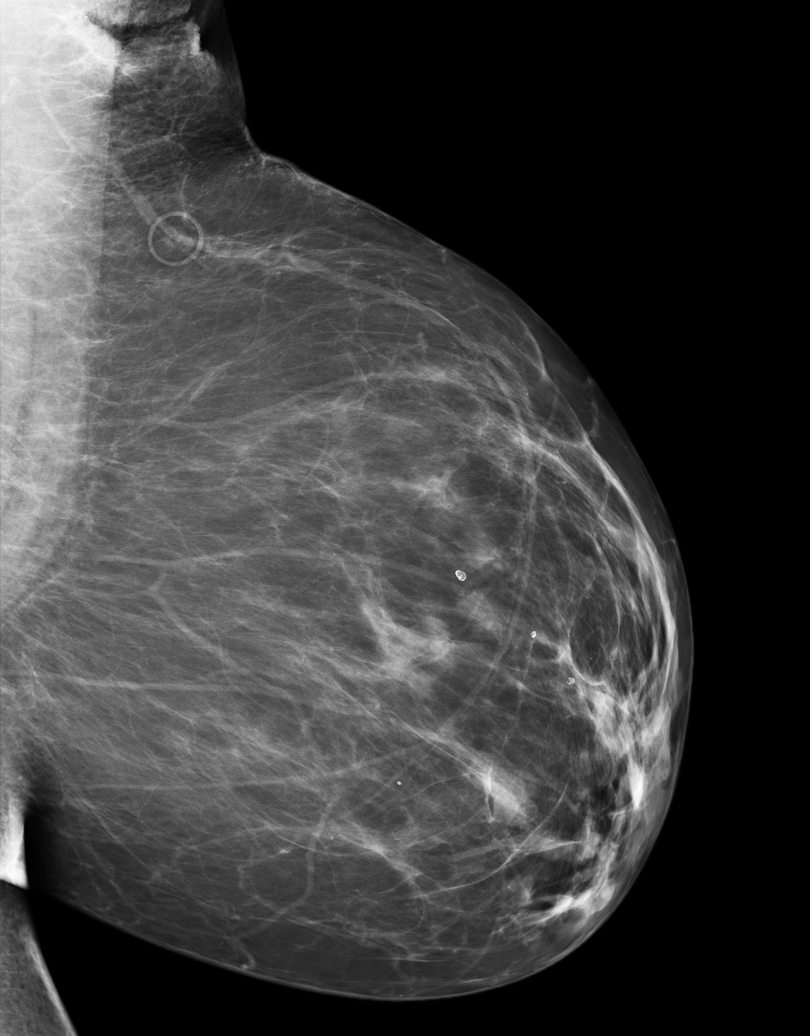
[im 5/5]
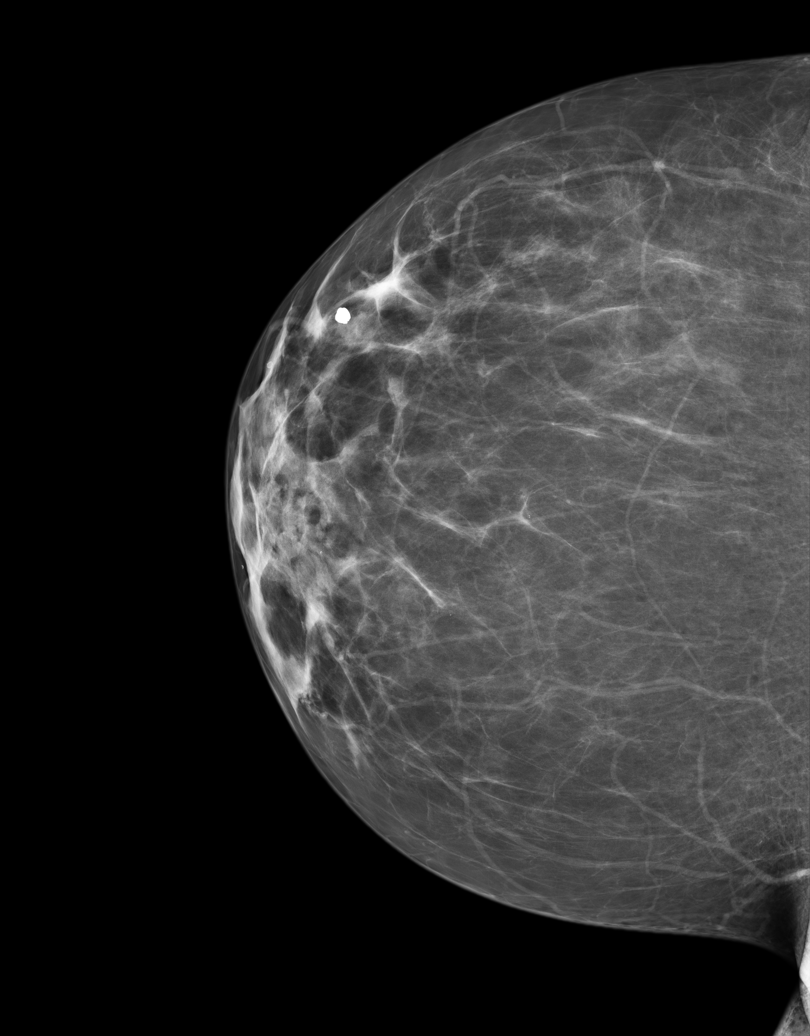

[5 of 5 positions shown; findings below may reference images not displayed]

ACR Breast Density Category b: There are scattered areas of
fibroglandular density.
FINDINGS: There are no findings suspicious for malignancy. Images were
processed with CAD.
IMPRESSION: No mammographic evidence of malignancy. A result letter of this
screening mammogram will be mailed directly to the patient.

RECOMMENDATION:
Screening mammogram in one year. (Code:[HN])

BI-RADS CATEGORY  1: Negative

## 2013-02-19 DIAGNOSIS — IMO0002 Reserved for concepts with insufficient information to code with codable children: Secondary | ICD-10-CM | POA: Insufficient documentation

## 2013-02-19 DIAGNOSIS — I272 Pulmonary hypertension, unspecified: Secondary | ICD-10-CM | POA: Insufficient documentation

## 2013-02-19 DIAGNOSIS — Z9981 Dependence on supplemental oxygen: Secondary | ICD-10-CM | POA: Insufficient documentation

## 2013-02-19 DIAGNOSIS — Z9889 Other specified postprocedural states: Secondary | ICD-10-CM | POA: Insufficient documentation

## 2013-02-19 DIAGNOSIS — J449 Chronic obstructive pulmonary disease, unspecified: Secondary | ICD-10-CM | POA: Insufficient documentation

## 2013-02-22 ENCOUNTER — Emergency Department: Payer: Self-pay | Admitting: Emergency Medicine

## 2013-02-22 LAB — BASIC METABOLIC PANEL
Anion Gap: 5 — ABNORMAL LOW (ref 7–16)
Calcium, Total: 8.9 mg/dL (ref 8.5–10.1)
Chloride: 104 mmol/L (ref 98–107)
Creatinine: 1.05 mg/dL (ref 0.60–1.30)
EGFR (Non-African Amer.): >60
Osmolality: 279 (ref 275–301)
Potassium: 3.8 mmol/L (ref 3.5–5.1)
Sodium: 140 mmol/L (ref 136–145)

## 2013-02-22 LAB — CBC
MCH: 28.6 pg (ref 26.0–34.0)
MCV: 88 fL (ref 80–100)
Platelet: 324 10*3/uL (ref 150–440)
RBC: 3.34 10*6/uL — ABNORMAL LOW (ref 3.80–5.20)
RDW: 15 % — ABNORMAL HIGH (ref 11.5–14.5)

## 2013-02-22 LAB — CK TOTAL AND CKMB (NOT AT ARMC): CK, Total: 121 U/L (ref 21–215)

## 2013-02-22 LAB — TROPONIN I: Troponin-I: <0.02 ng/mL

## 2013-02-22 IMAGING — CR DG CHEST 2V
1 series · 2 of 2 positions shown · non-contrast
Comparison: [DATE]

CLINICAL DATA: Dyspnea, left-sided chest discomfort, valvular heart
disease.

EXAM:
CHEST  2 VIEW

[Series 1: w chest pa · 0.14mm/px · 2 of 2 slices shown]
[im 1/2]
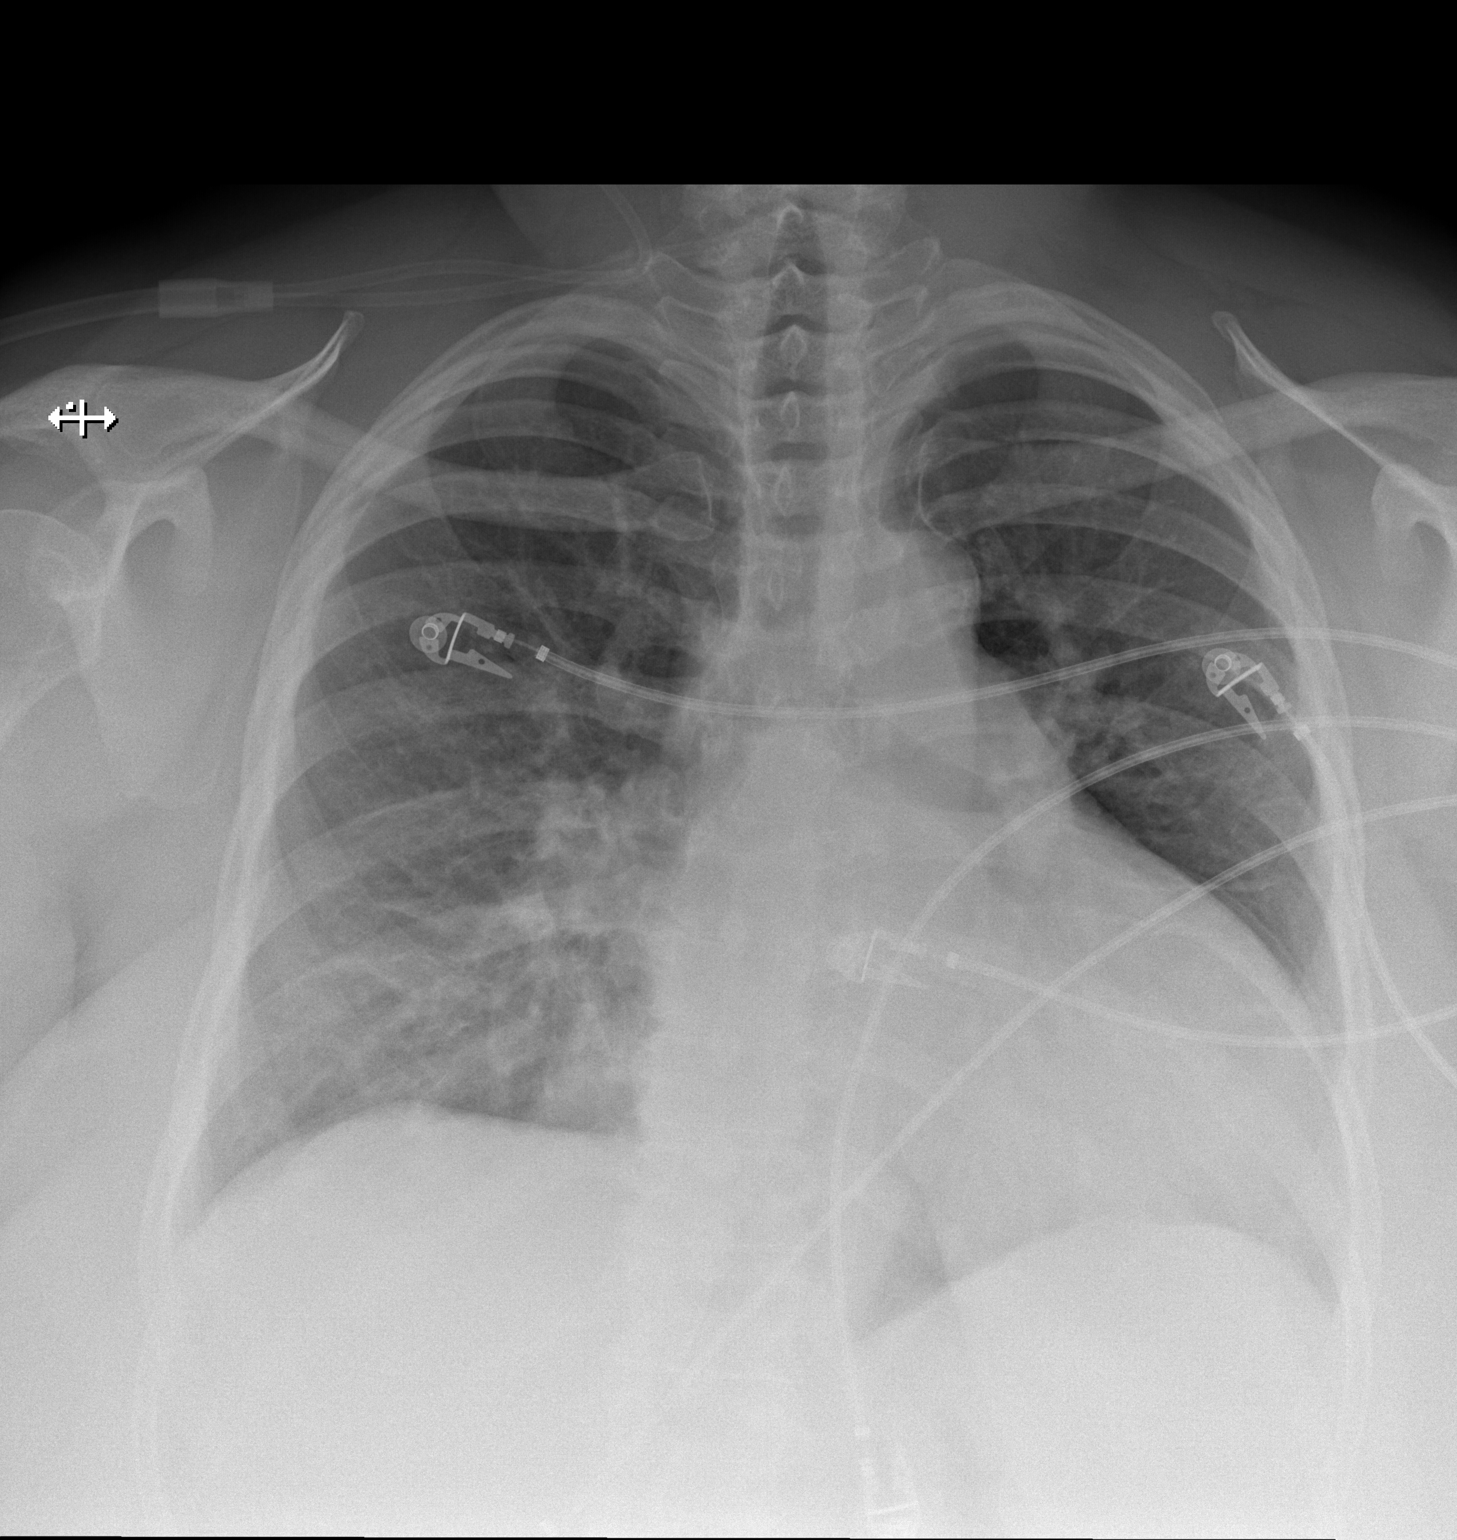
[im 2/2]
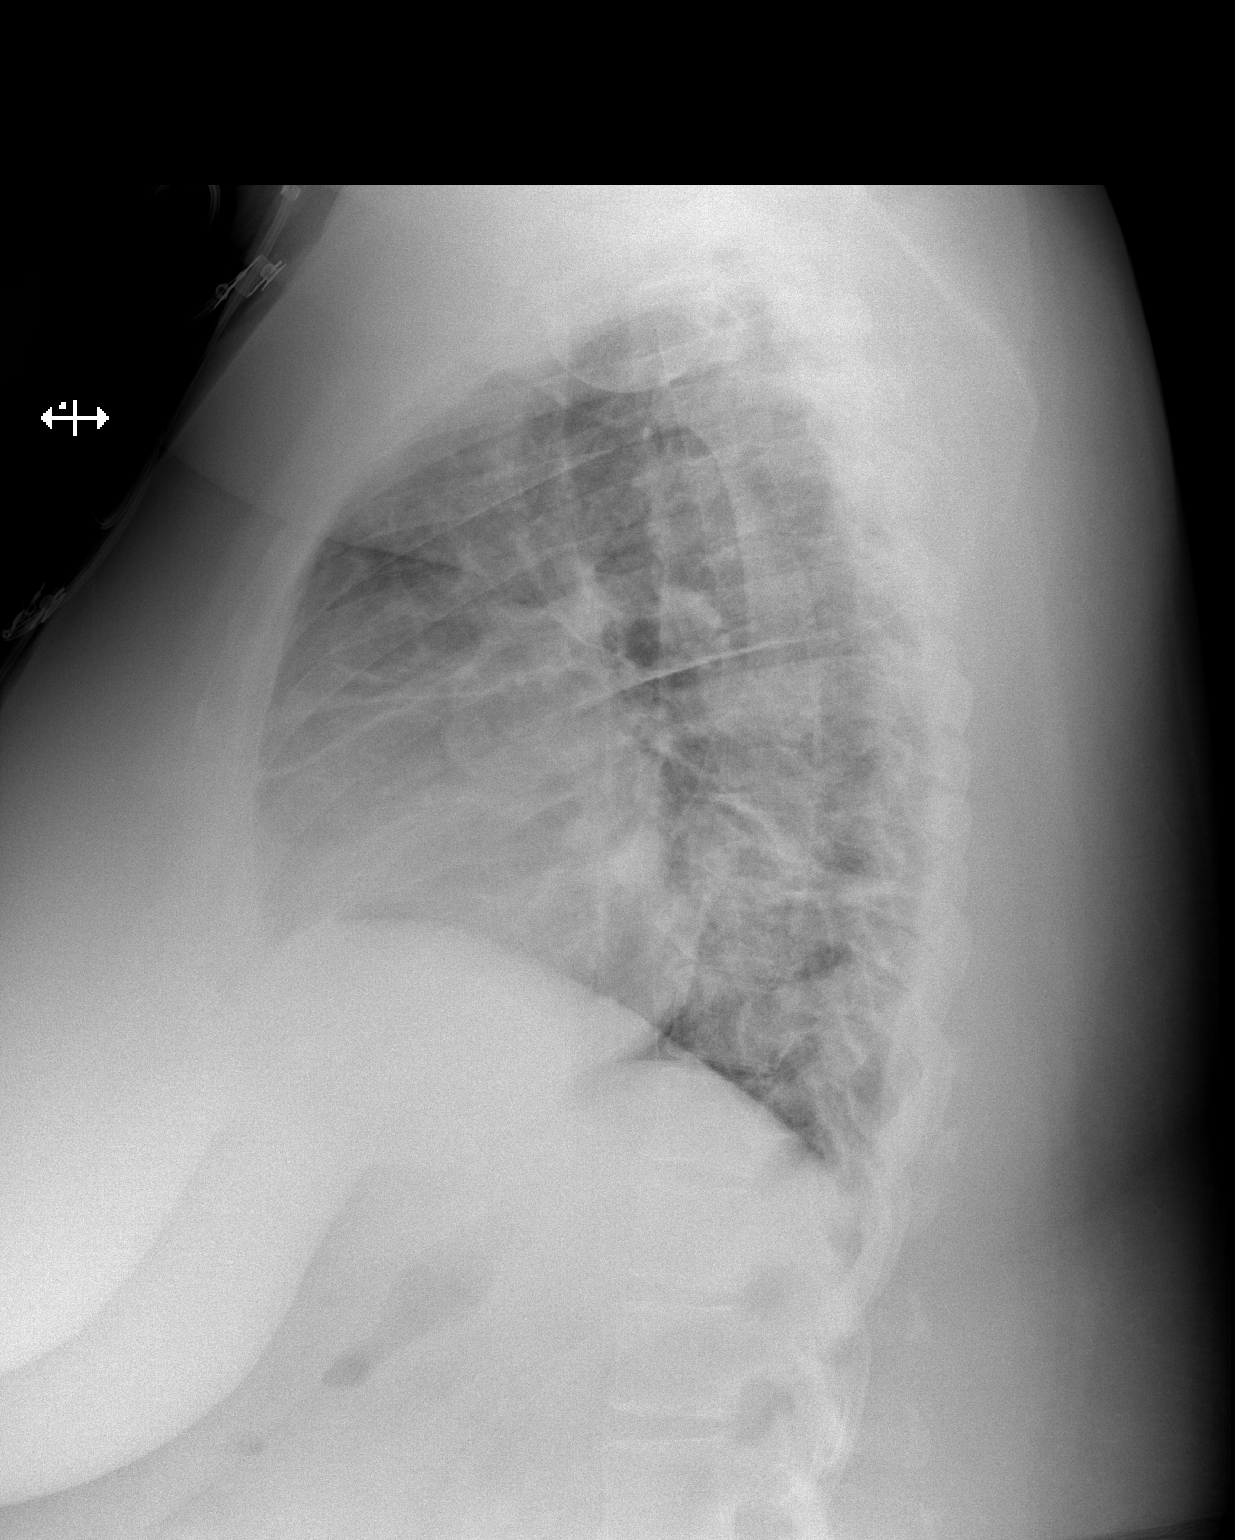

[2 of 2 positions shown; findings below may reference images not displayed]

FINDINGS: The cardiac silhouette remains enlarged. The central pulmonary
vascularity is prominent but stable. The pulmonary interstitial
markings are somewhat more prominent today than on the previous
study. There is no pleural effusion. There is no alveolar
infiltrate. There is no pneumothorax. The observed portions of the
bony thorax exhibit no acute abnormalities.
IMPRESSION: The findings are consistent with low-grade pulmonary interstitial
edema secondary to CHF. There is no focal pneumonia.

## 2013-03-27 IMAGING — US US CAROTID DUPLEX BILAT
1 series · 13 of 24 positions shown · non-contrast
Comparison: None

CLINICAL DATA: Stroke, hypertension, diabetes mellitus, COPD,
smoker

EXAM:
BILATERAL CAROTID DUPLEX ULTRASOUND
TECHNIQUE: Gray scale imaging, color Doppler and duplex ultrasound were
performed of bilateral carotid and vertebral arteries in the neck.

[Series 1: us carotid duplex bilat · 0.06mm/px · 13 of 62 slices shown]
[im 1/62]
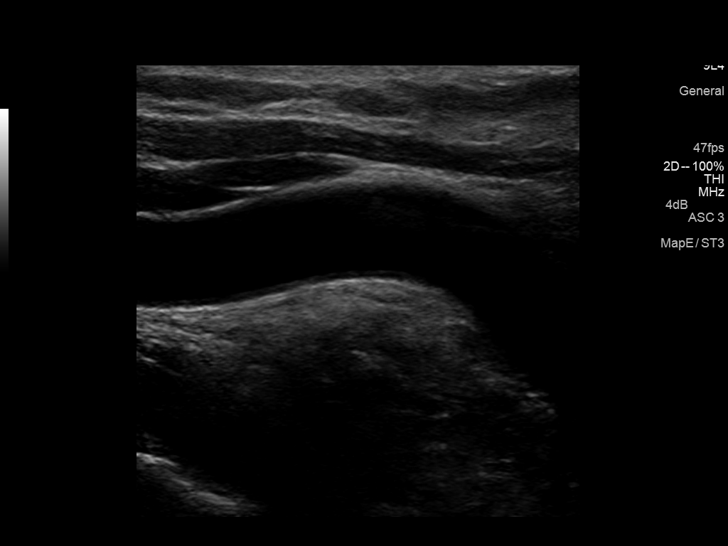
[im 6/62]
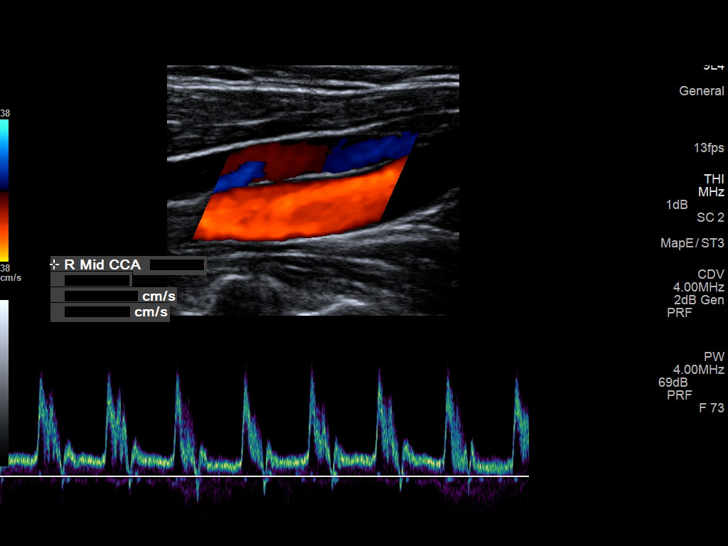
[im 11/62]
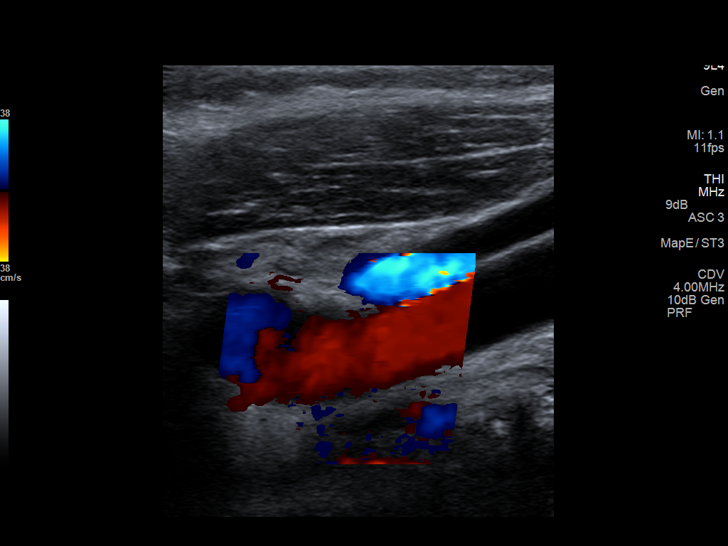
[im 16/62]
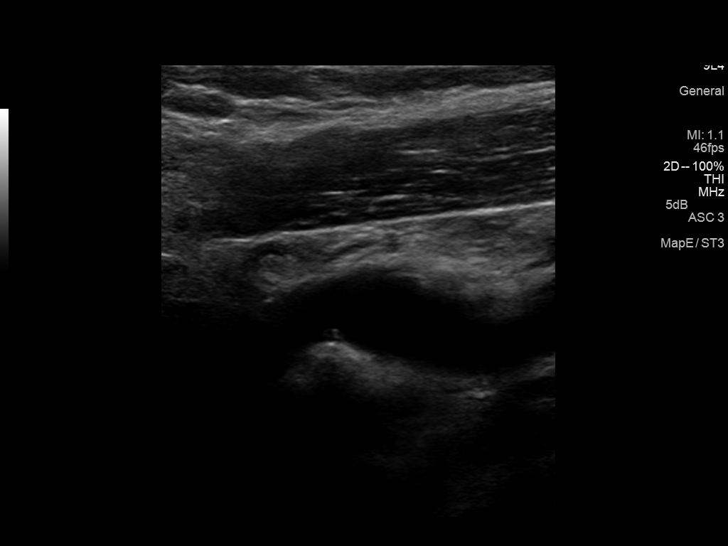
[im 22/62]
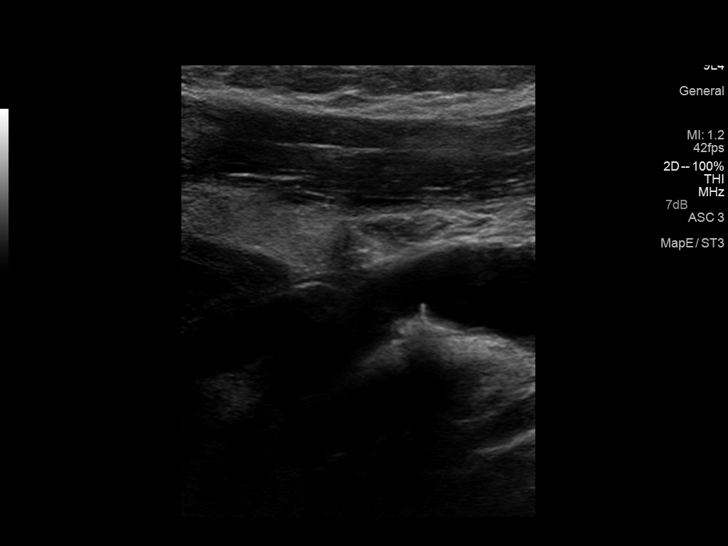
[im 27/62]
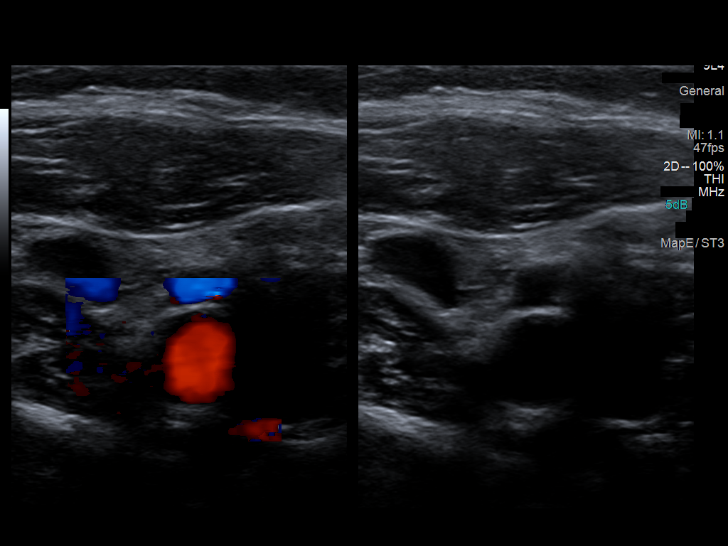
[im 32/62]
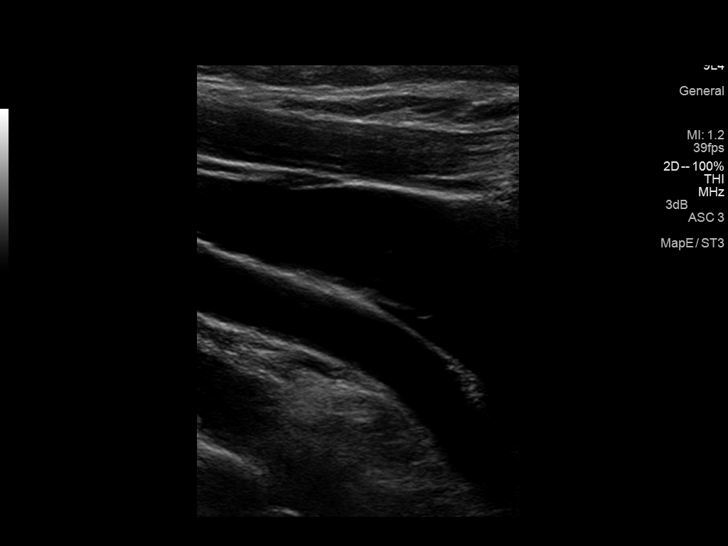
[im 35/62]
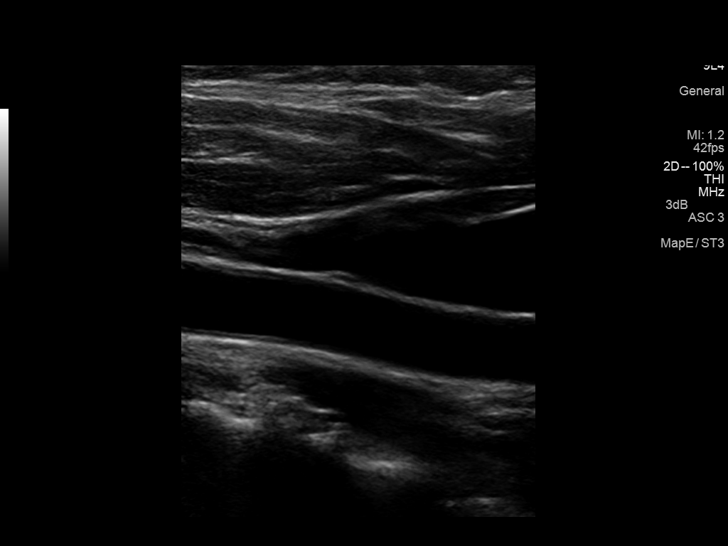
[im 40/62]
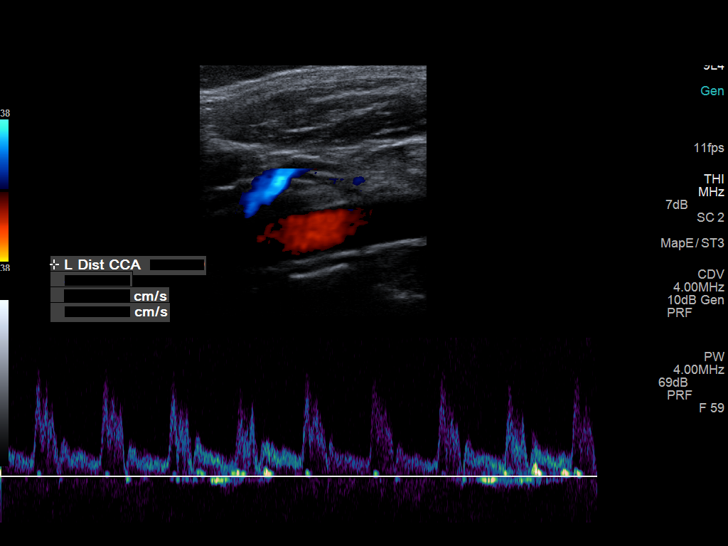
[im 46/62]
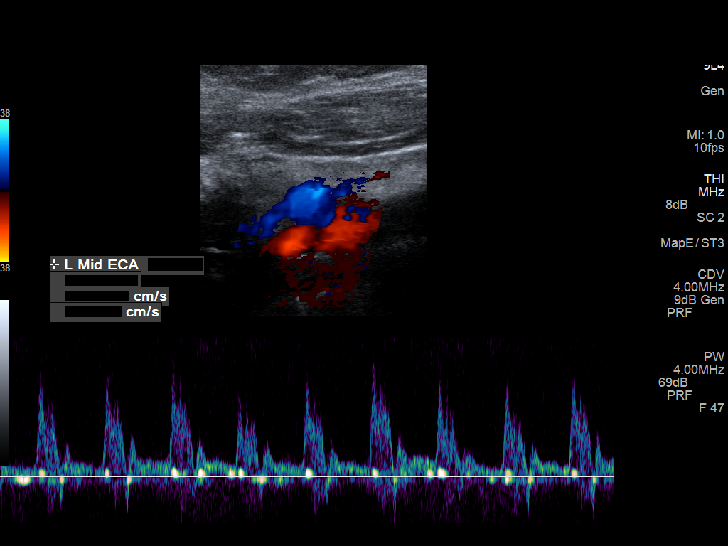
[im 51/62]
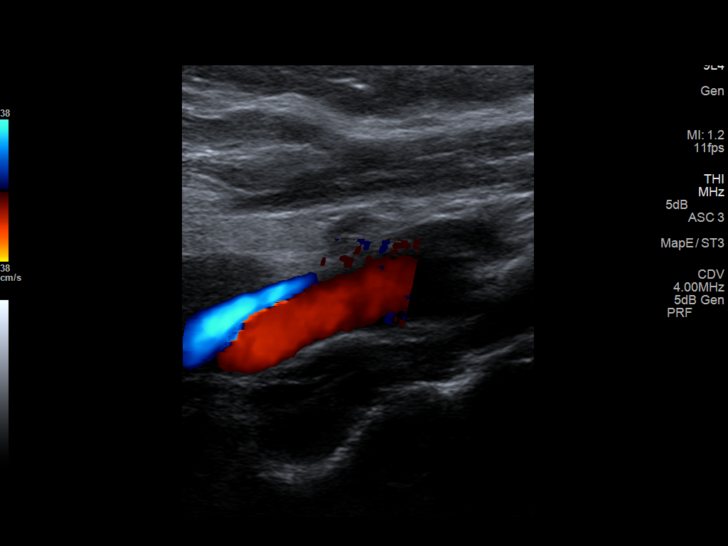
[im 56/62]
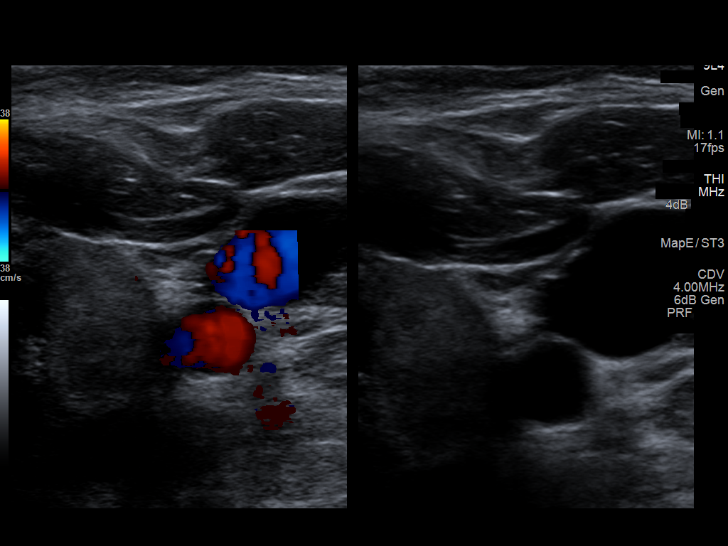
[im 62/62]
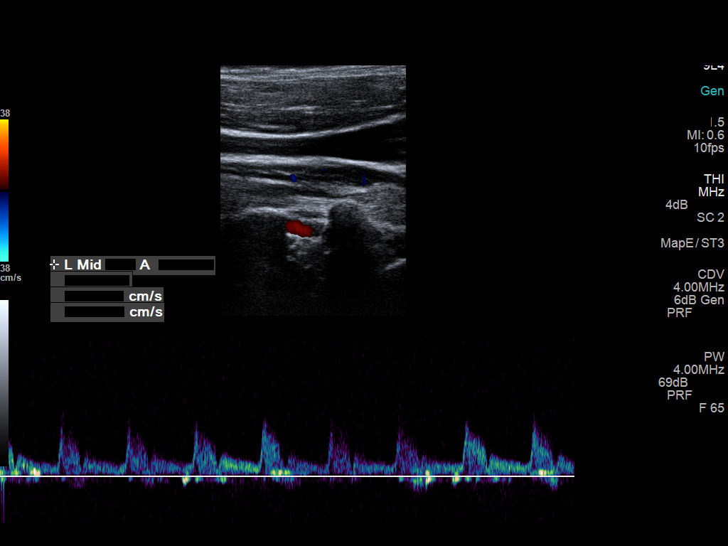

[13 of 24 positions shown; findings below may reference images not displayed]

FINDINGS: Criteria: Quantification of carotid stenosis is based on velocity
parameters that correlate the residual internal carotid diameter
with NASCET-based stenosis levels, using the diameter of the distal
internal carotid lumen as the denominator for stenosis measurement.

The following velocity measurements were obtained:

RIGHT

ICA:  86/25 cm/sec

CCA:  119/25 cm/sec

SYSTOLIC ICA/CCA RATIO:

DIASTOLIC ICA/CCA RATIO:

ECA:  85 cm/sec

LEFT

ICA:  82/36 cm/sec

CCA:  114/22 cm/sec

SYSTOLIC ICA/CCA RATIO:

DIASTOLIC ICA/CCA RATIO:

ECA:  66 cm/sec

RIGHT CAROTID ARTERY: Tortuous RIGHT carotid system. Laminar flow on
color Doppler imaging. Single tiny hypoechoic plaque proximal RIGHT
ICA. No high velocity jet. Waveform analysis shows spectral
broadening, likely due to tortuosity.

RIGHT VERTEBRAL ARTERY:  Patent, antegrade

LEFT CAROTID ARTERY: Tortuous LEFT carotid system. Laminar flow on
color Doppler imaging. No significant plaque identified. Mild
spectral broadening on waveform analysis likely related to
tortuosity. No high velocity jets.

LEFT VERTEBRAL ARTERY:  Patent, antegrade
IMPRESSION: Single tiny plaque proximal RIGHT ICA.

No evidence of hemodynamically stenosis.

## 2013-05-12 ENCOUNTER — Emergency Department: Payer: Self-pay | Admitting: Emergency Medicine

## 2013-05-12 LAB — URINALYSIS, COMPLETE
BILIRUBIN, UR: NEGATIVE
Bacteria: NONE SEEN
Glucose,UR: NEGATIVE mg/dL (ref 0–75)
KETONE: NEGATIVE
Nitrite: NEGATIVE
PROTEIN: NEGATIVE
Ph: 6 (ref 4.5–8.0)
SPECIFIC GRAVITY: 1.015 (ref 1.003–1.030)
WBC UR: 13 /HPF (ref 0–5)

## 2013-05-23 ENCOUNTER — Emergency Department: Payer: Self-pay | Admitting: Emergency Medicine

## 2013-05-23 LAB — URINALYSIS, COMPLETE
BILIRUBIN, UR: NEGATIVE
BLOOD: NEGATIVE
Glucose,UR: NEGATIVE mg/dL (ref 0–75)
Ketone: NEGATIVE
Nitrite: NEGATIVE
PH: 5 (ref 4.5–8.0)
Protein: NEGATIVE
RBC,UR: 1 /HPF (ref 0–5)
SPECIFIC GRAVITY: 1.018 (ref 1.003–1.030)
Squamous Epithelial: 5
WBC UR: 6 /HPF (ref 0–5)

## 2013-05-23 LAB — COMPREHENSIVE METABOLIC PANEL
ALK PHOS: 84 U/L
ALT: 20 U/L (ref 12–78)
ANION GAP: 4 — AB (ref 7–16)
AST: 22 U/L (ref 15–37)
Albumin: 3.7 g/dL (ref 3.4–5.0)
BUN: 14 mg/dL (ref 7–18)
Bilirubin,Total: 0.2 mg/dL (ref 0.2–1.0)
CHLORIDE: 102 mmol/L (ref 98–107)
CO2: 31 mmol/L (ref 21–32)
Calcium, Total: 9 mg/dL (ref 8.5–10.1)
Creatinine: 1.11 mg/dL (ref 0.60–1.30)
EGFR (African American): >60
EGFR (Non-African Amer.): 57 — ABNORMAL LOW
Glucose: 160 mg/dL — ABNORMAL HIGH (ref 65–99)
Osmolality: 278 (ref 275–301)
POTASSIUM: 3.5 mmol/L (ref 3.5–5.1)
Sodium: 137 mmol/L (ref 136–145)
Total Protein: 7.6 g/dL (ref 6.4–8.2)

## 2013-05-23 LAB — CBC
HCT: 33.6 % — AB (ref 35.0–47.0)
HGB: 11.2 g/dL — AB (ref 12.0–16.0)
MCH: 29.6 pg (ref 26.0–34.0)
MCHC: 33.3 g/dL (ref 32.0–36.0)
MCV: 89 fL (ref 80–100)
Platelet: 328 10*3/uL (ref 150–440)
RBC: 3.78 10*6/uL — AB (ref 3.80–5.20)
RDW: 14.3 % (ref 11.5–14.5)
WBC: 9 10*3/uL (ref 3.6–11.0)

## 2013-05-23 LAB — PRO B NATRIURETIC PEPTIDE: B-TYPE NATIURETIC PEPTID: 202 pg/mL — AB (ref 0–125)

## 2013-07-14 ENCOUNTER — Emergency Department: Payer: Self-pay | Admitting: Emergency Medicine

## 2013-07-14 LAB — URINALYSIS, COMPLETE
Bacteria: NONE SEEN
Bilirubin,UR: NEGATIVE
Blood: NEGATIVE
Glucose,UR: NEGATIVE mg/dL (ref 0–75)
Ketone: NEGATIVE
NITRITE: NEGATIVE
PROTEIN: NEGATIVE
Ph: 5 (ref 4.5–8.0)
RBC,UR: 2 /HPF (ref 0–5)
SPECIFIC GRAVITY: 1.013 (ref 1.003–1.030)
WBC UR: 5 /HPF (ref 0–5)

## 2013-07-14 IMAGING — US US EXTREM LOW VENOUS BILAT
1 series · 14 of 24 positions shown · non-contrast
Comparison: None.

CLINICAL DATA: Lower extremity pain and edema

EXAM:
BILATERAL LOWER EXTREMITY VENOUS DOPPLER ULTRASOUND
TECHNIQUE: Gray-scale sonography with graded compression, as well as color
Doppler and duplex ultrasound, were performed to evaluate the deep
venous system from the level of the common femoral vein through the
popliteal and proximal calf veins. Spectral Doppler was utilized to
evaluate flow at rest and with distal augmentation maneuvers.

[Series 1: us extrem low venous bilat · 0.09mm/px · 14 of 72 slices shown]
[im 1/72]
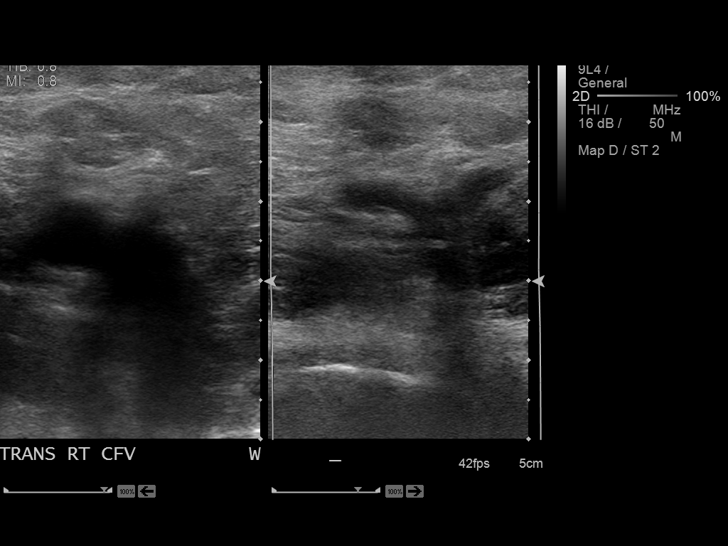
[im 7/72]
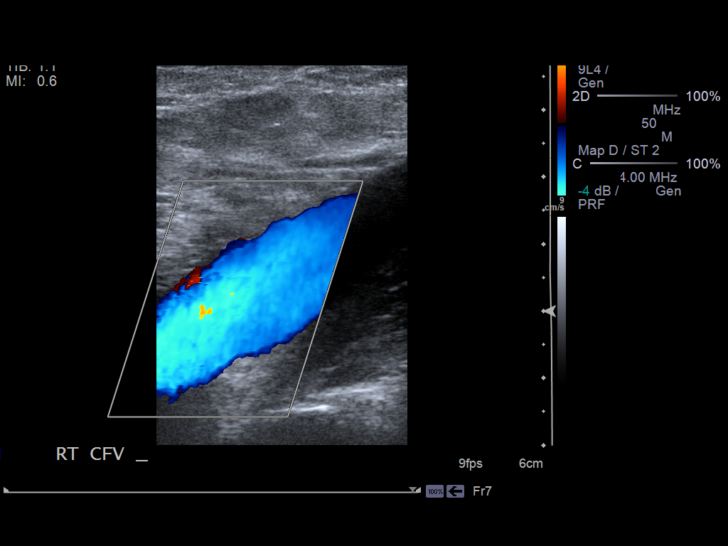
[im 13/72]
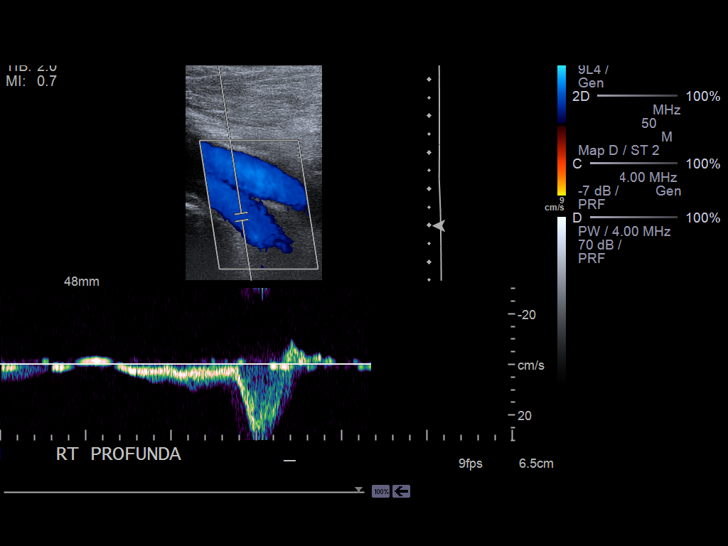
[im 19/72]
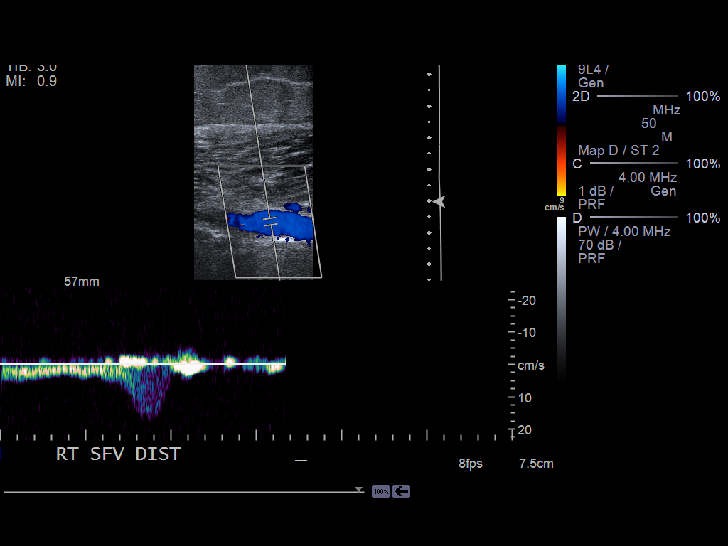
[im 22/72]
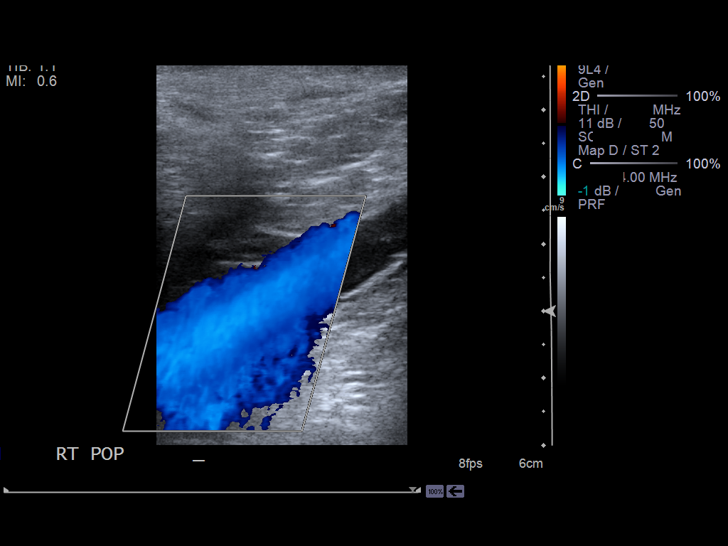
[im 28/72]
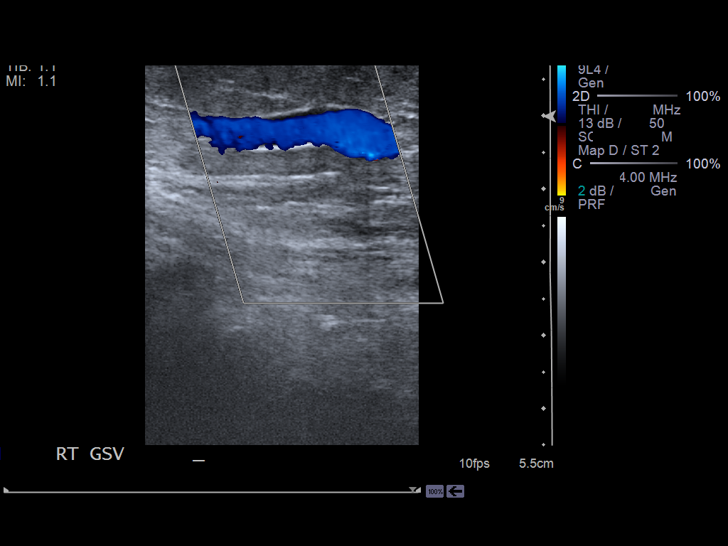
[im 34/72]
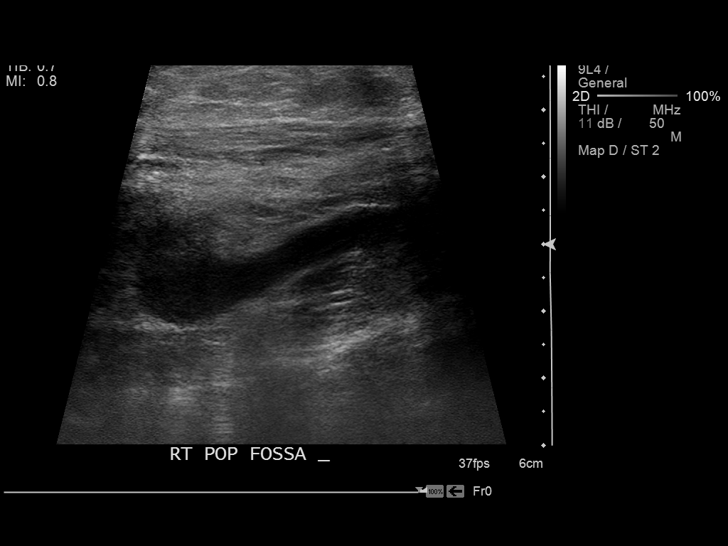
[im 38/72]
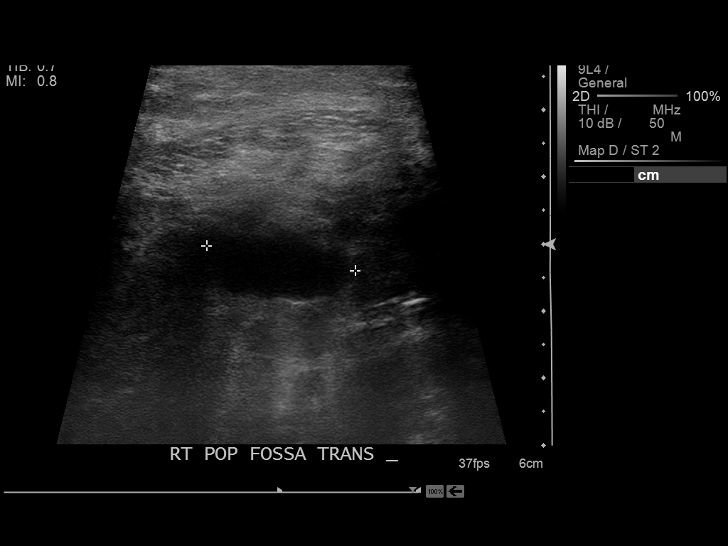
[im 44/72]
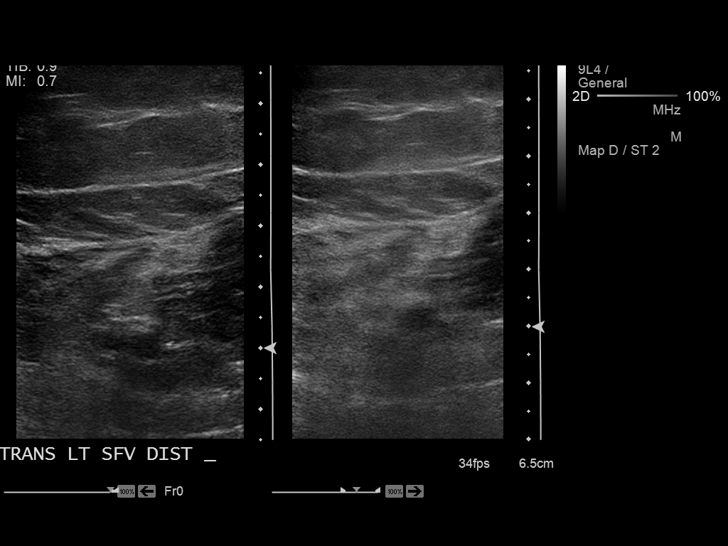
[im 50/72]
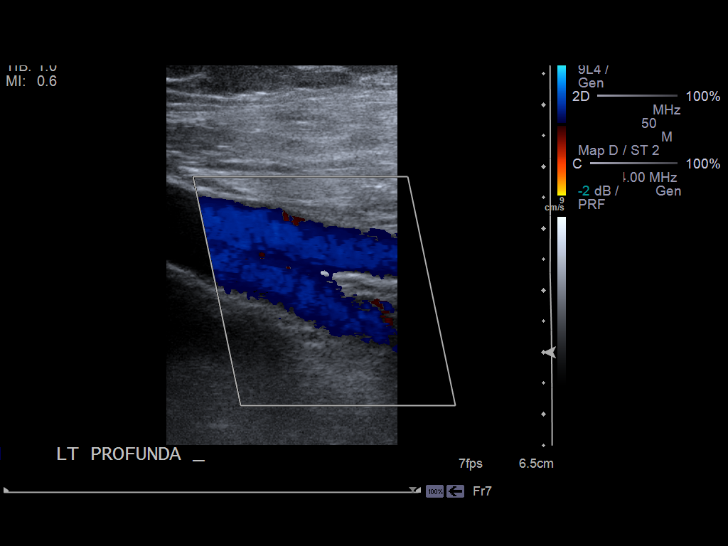
[im 56/72]
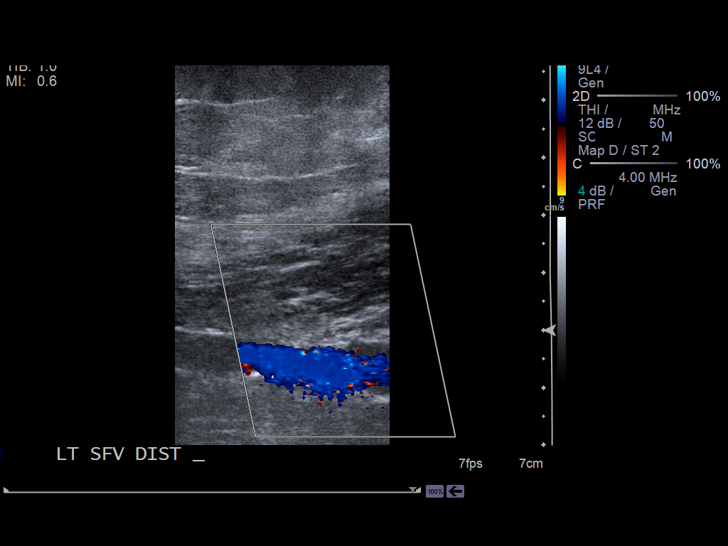
[im 59/72]
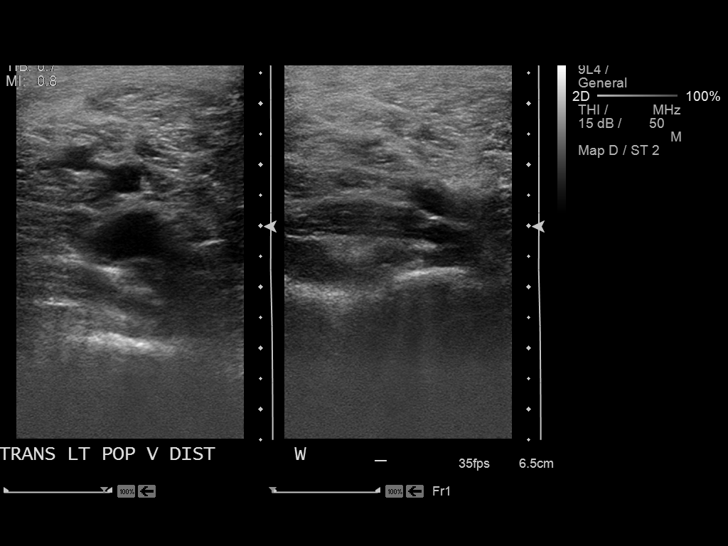
[im 65/72]
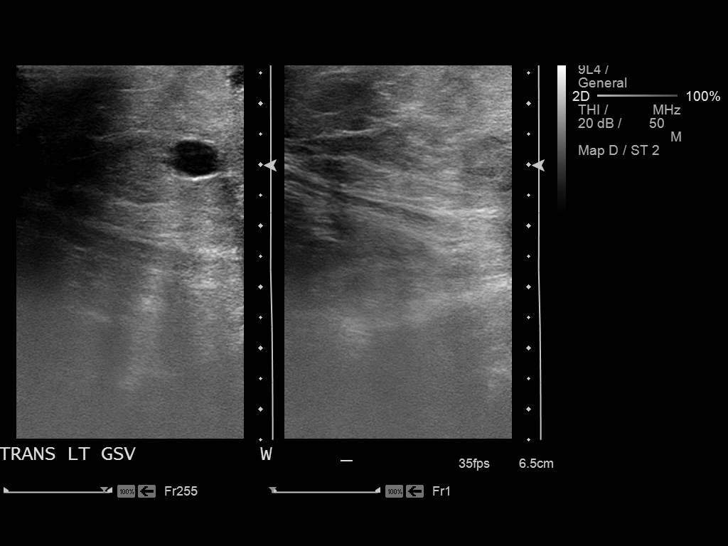
[im 72/72]
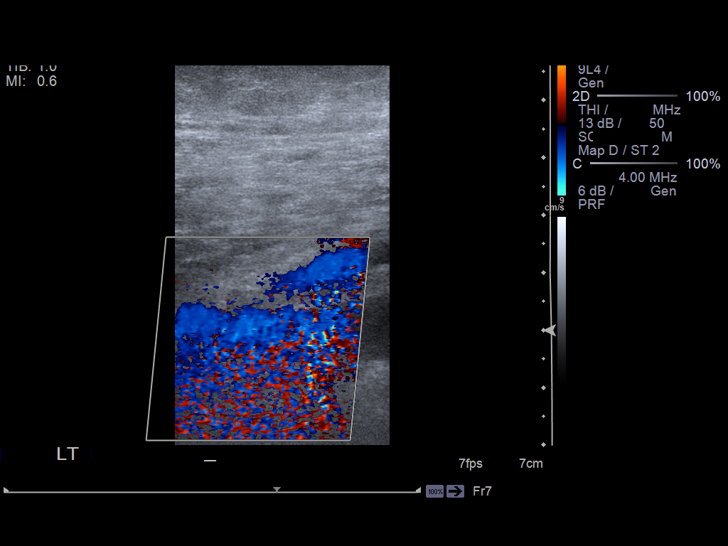

[14 of 24 positions shown; findings below may reference images not displayed]

FINDINGS: Thrombus within deep veins:  None visualized.

Compressibility of deep veins:  Normal.

Duplex waveform respiratory phasicity:  Normal.

Duplex waveform response to augmentation:  Normal.

Venous reflux:  None visualized.

Other findings: Right popliteal fossa Baker cyst noted measuring
x 1.4 x 2.3 cm.
IMPRESSION: Negative for DVT in either extremity.

5 cm right Baker's cyst

## 2013-08-10 ENCOUNTER — Emergency Department: Payer: Self-pay | Admitting: Emergency Medicine

## 2013-08-11 LAB — BASIC METABOLIC PANEL
Anion Gap: 8 (ref 7–16)
BUN: 14 mg/dL (ref 7–18)
CHLORIDE: 102 mmol/L (ref 98–107)
CO2: 32 mmol/L (ref 21–32)
CREATININE: 0.92 mg/dL (ref 0.60–1.30)
Calcium, Total: 9.7 mg/dL (ref 8.5–10.1)
EGFR (African American): >60
EGFR (Non-African Amer.): >60
GLUCOSE: 113 mg/dL — AB (ref 65–99)
OSMOLALITY: 284 (ref 275–301)
Potassium: 3.4 mmol/L — ABNORMAL LOW (ref 3.5–5.1)
Sodium: 142 mmol/L (ref 136–145)

## 2013-08-11 LAB — CBC
HCT: 34.1 % — AB (ref 35.0–47.0)
HGB: 10.8 g/dL — ABNORMAL LOW (ref 12.0–16.0)
MCH: 28.1 pg (ref 26.0–34.0)
MCHC: 31.6 g/dL — AB (ref 32.0–36.0)
MCV: 89 fL (ref 80–100)
PLATELETS: 327 10*3/uL (ref 150–440)
RBC: 3.83 10*6/uL (ref 3.80–5.20)
RDW: 14.7 % — ABNORMAL HIGH (ref 11.5–14.5)
WBC: 11 10*3/uL (ref 3.6–11.0)

## 2013-08-11 LAB — TROPONIN I

## 2013-08-11 LAB — LIPASE, BLOOD: Lipase: 225 U/L (ref 73–393)

## 2013-08-11 LAB — PRO B NATRIURETIC PEPTIDE: B-Type Natriuretic Peptide: 326 pg/mL — ABNORMAL HIGH (ref 0–125)

## 2013-08-11 IMAGING — CR DG CHEST 2V
1 series · 2 of 2 positions shown · non-contrast
Comparison: DG CHEST 2V dated [DATE]

CLINICAL DATA: Chest pain

EXAM:
CHEST  2 VIEW

[Series 2: w chest pa · 0.14mm/px · 2 of 2 slices shown]
[im 1/2]
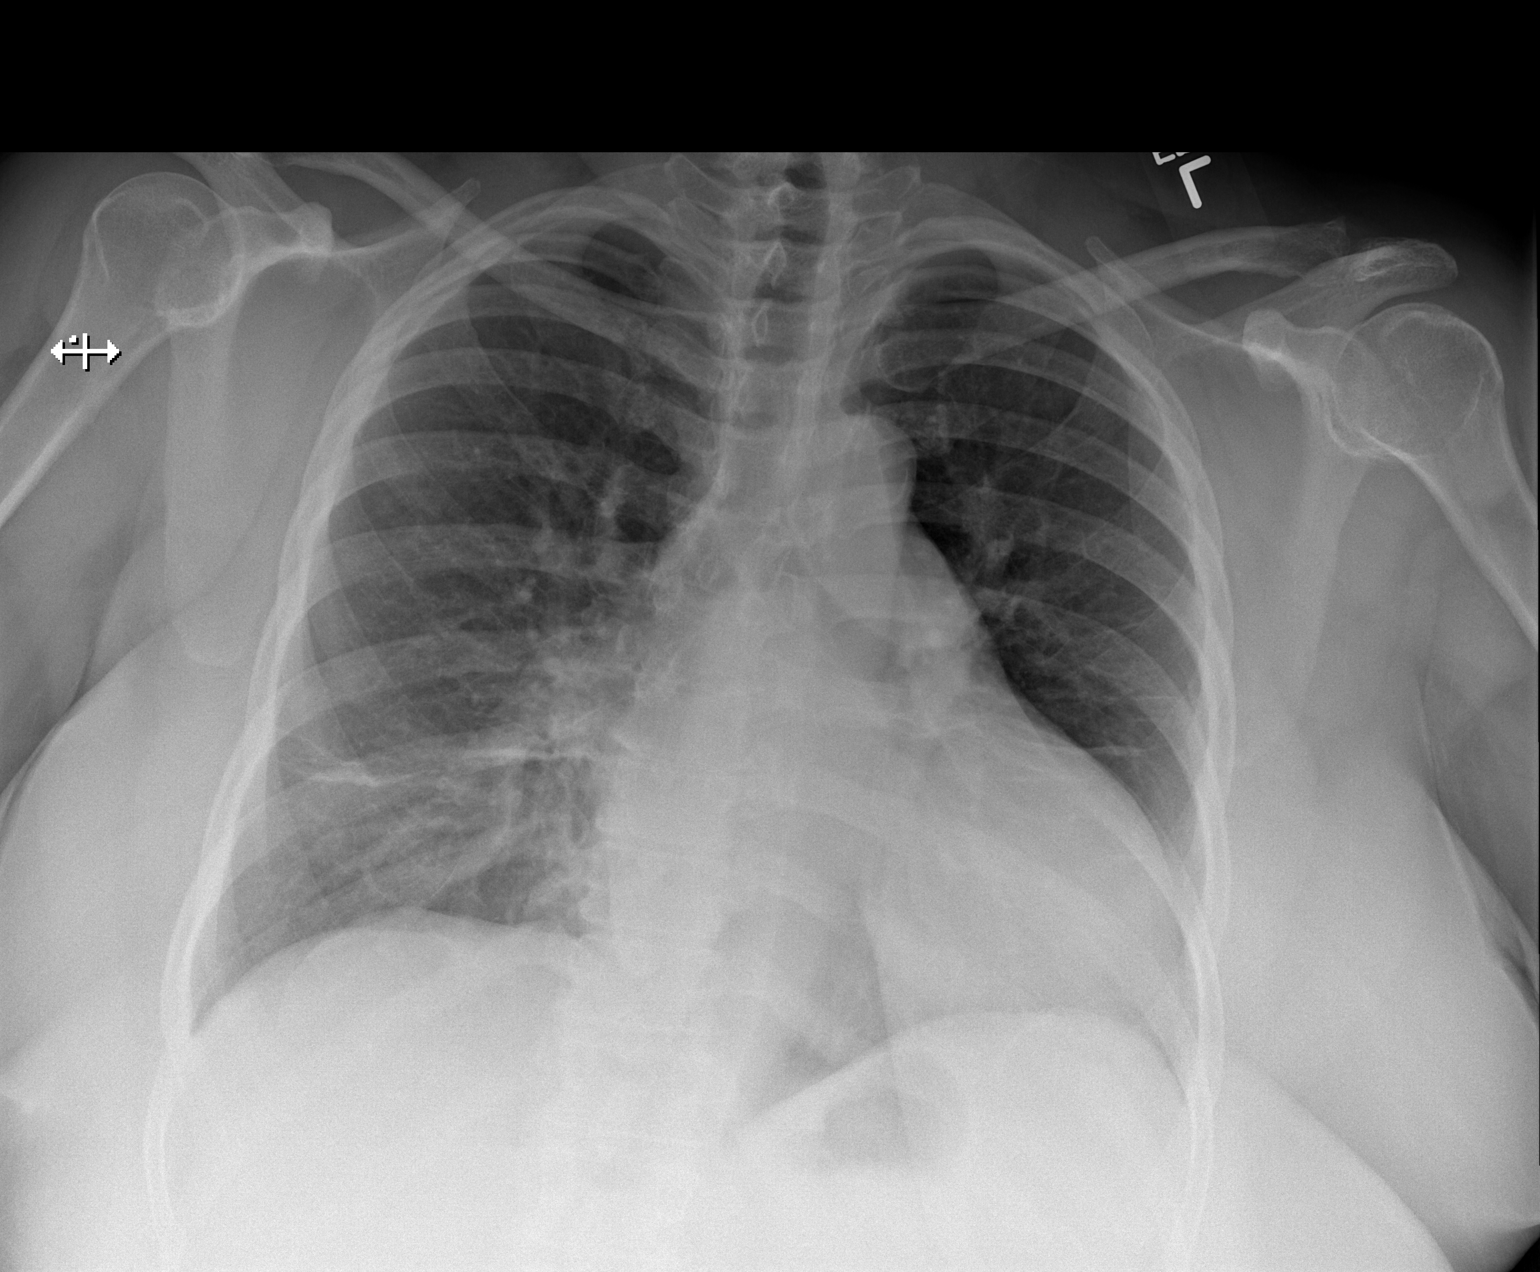
[im 2/2]
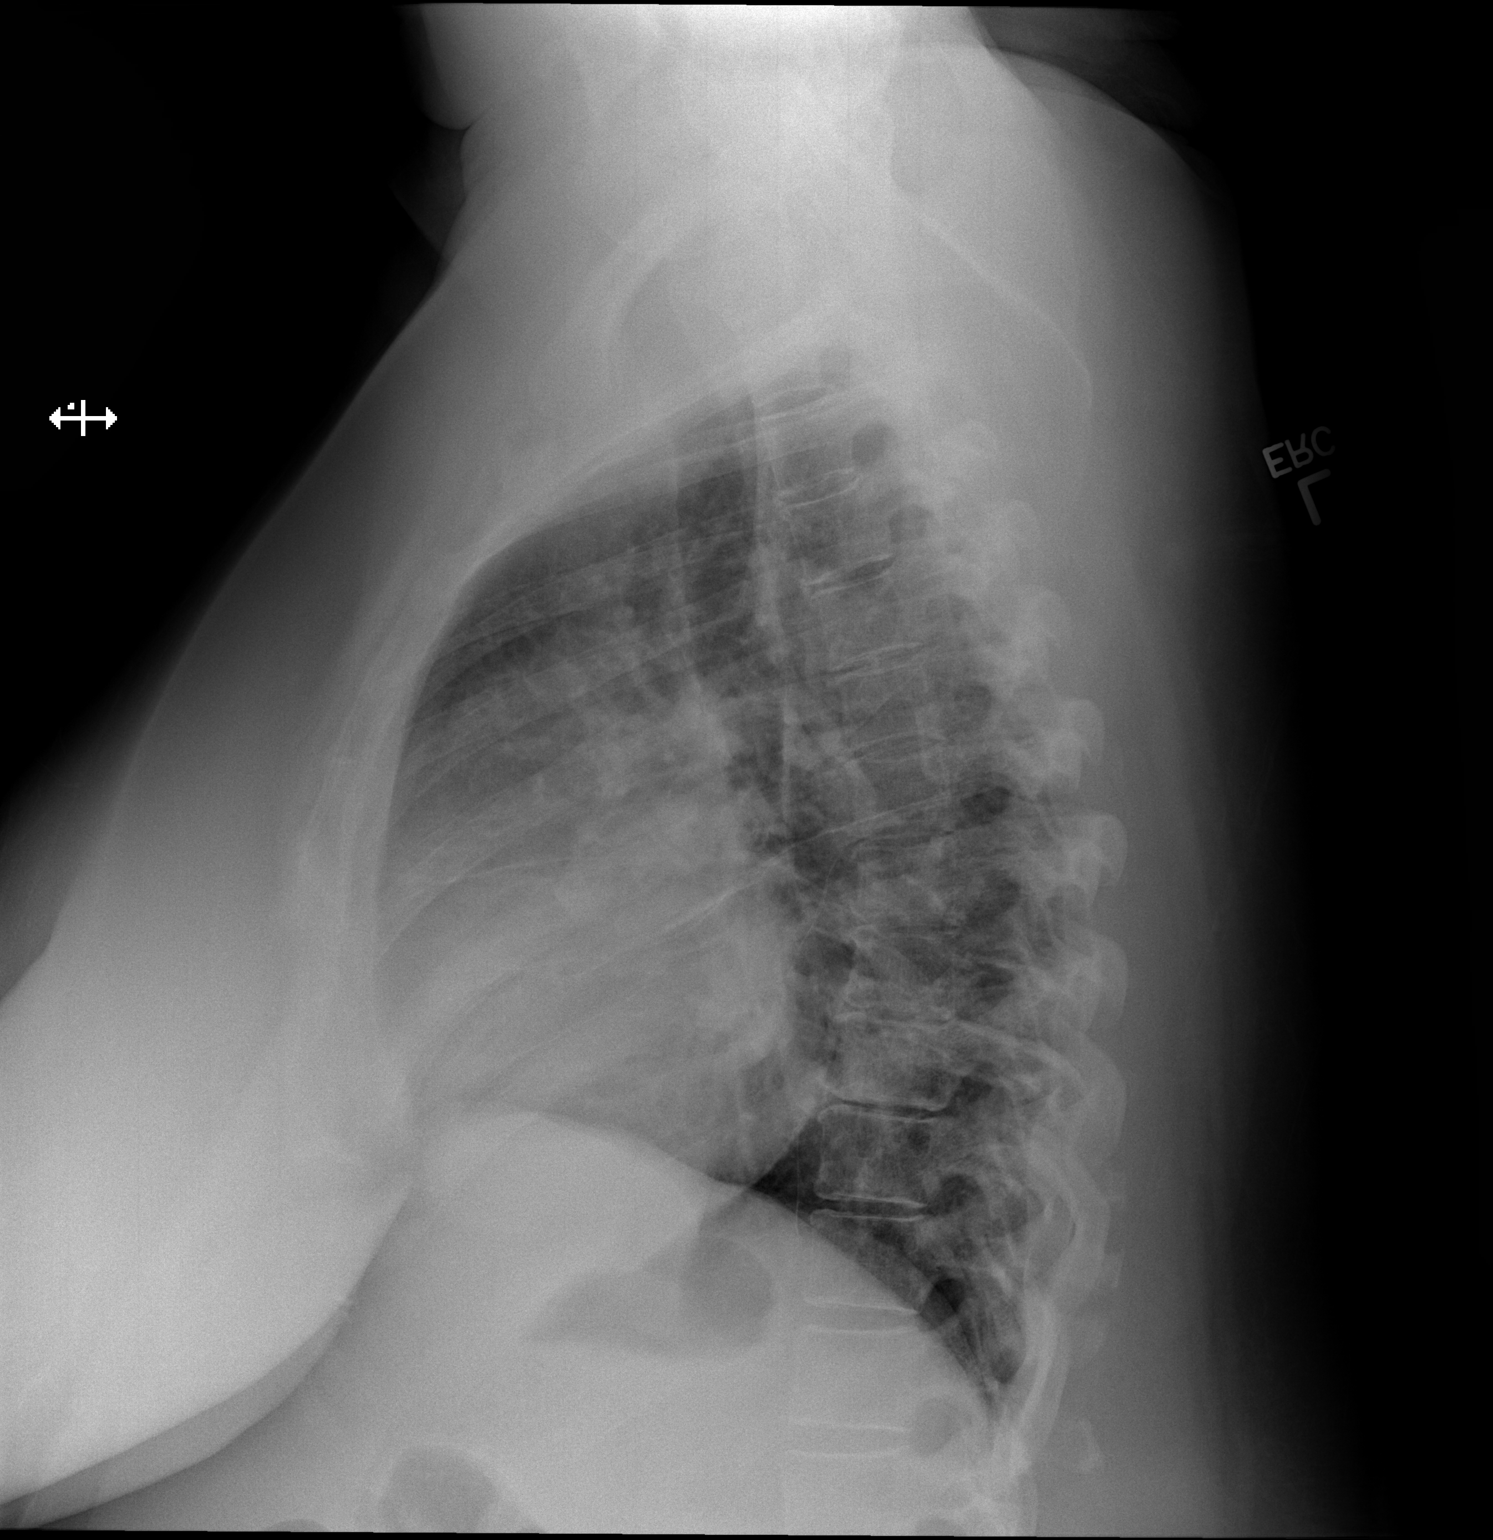

[2 of 2 positions shown; findings below may reference images not displayed]

FINDINGS: Shallow inspiration. Borderline heart size with normal pulmonary
vascularity. Linear fibrosis or atelectasis in the mid lungs. No
focal airspace disease or consolidation. No blunting of costophrenic
angles. No pneumothorax.
IMPRESSION: Cardiac enlargement. Linear fibrosis or atelectasis in the mid
lungs.

## 2013-08-28 ENCOUNTER — Ambulatory Visit (INDEPENDENT_AMBULATORY_CARE_PROVIDER_SITE_OTHER): Payer: Medicare Other | Admitting: Podiatry

## 2013-08-28 ENCOUNTER — Encounter: Payer: Self-pay | Admitting: Podiatry

## 2013-08-28 ENCOUNTER — Ambulatory Visit (INDEPENDENT_AMBULATORY_CARE_PROVIDER_SITE_OTHER): Payer: Medicare Other

## 2013-08-28 VITALS — BP 147/70 | HR 91 | Resp 16 | Ht 62.0 in | Wt 240.0 lb

## 2013-08-28 DIAGNOSIS — E1140 Type 2 diabetes mellitus with diabetic neuropathy, unspecified: Secondary | ICD-10-CM

## 2013-08-28 DIAGNOSIS — M79673 Pain in unspecified foot: Secondary | ICD-10-CM

## 2013-08-28 DIAGNOSIS — M722 Plantar fascial fibromatosis: Secondary | ICD-10-CM

## 2013-08-28 DIAGNOSIS — M79609 Pain in unspecified limb: Secondary | ICD-10-CM

## 2013-08-28 DIAGNOSIS — E1149 Type 2 diabetes mellitus with other diabetic neurological complication: Secondary | ICD-10-CM

## 2013-08-28 DIAGNOSIS — B351 Tinea unguium: Secondary | ICD-10-CM

## 2013-08-28 NOTE — Progress Notes (Signed)
   Subjective:    Patient ID: Teresa [name], female    DOB: 11-08-1960, 53 y.o.   MRN: [id number]  HPI Comments: Both of my feet burn, tingle and go numb. Its been going on for a long time. Its gotten worse. i elevate my feet and try walking but i cant stand it. i went to [facility] and they trimmed my toenails and never sent me an appt to come back.   Foot Pain Associated symptoms include chest pain, coughing, fatigue, headaches, nausea, numbness, a sore throat and weakness.      Review of Systems  Constitutional: Positive for activity change, appetite change and fatigue.       Sweating Weight changes  HENT: Positive for sneezing, sore throat and trouble swallowing.        Sinus problems   Respiratory: Positive for cough, chest tightness and wheezing.        Difficulty breathing    Cardiovascular: Positive for chest pain and leg swelling.       Calf pain with walking   Gastrointestinal: Positive for nausea.       Bloating   Endocrine: Positive for heat intolerance.  Genitourinary: Positive for frequency.  Musculoskeletal:       Joint pain Back pain Difficulty walking   Neurological: Positive for dizziness, weakness, light-headedness, numbness and headaches.  Psychiatric/Behavioral: Positive for confusion. The patient is nervous/anxious.   All other systems reviewed and are negative.      Objective:   Physical Exam: I have reviewed her past mental history medications allergies surgeries social history and review of systems. Pulses are strongly palpable bilateral. Neurologic sensorium is intact per Semmes-Weinstein monofilament. Deep tendon reflexes are intact bilateral muscle strength is 5 over 5 dorsiflexors plantar flexors inverters everters all intrinsic musculature is intact. Orthopedic evaluation demonstrates all joints distal to the ankle have a full range of motion without crepitation. She has pain on palpation of her medial calcaneal tubercle bilateral.  Radiographic evaluation demonstrates mild pes planus bilateral good osseous architecture other than a soft tissue increase in density at the plantar fascial calcaneal insertion site bilateral. Cutaneous evaluation demonstrates supple well hydrated cutis mildly xerotic to the plantar aspect of the foot but minimally so. But she does have thick yellow dystrophic clinically mycotic and painful nails bilaterally.        Assessment & Plan:  Assessment: Plantar fasciitis bilateral. Diabetes. Pain in limb secondary to onychomycosis 1 through 5 bilateral.  Plan: Debridement of nails 1 through 5 bilateral covered service secondary to pain and injected bilateral heels with Kenalog and local anesthetic.

## 2013-09-06 DIAGNOSIS — I38 Endocarditis, valve unspecified: Secondary | ICD-10-CM | POA: Insufficient documentation

## 2013-09-06 DIAGNOSIS — J984 Other disorders of lung: Secondary | ICD-10-CM | POA: Insufficient documentation

## 2013-09-06 DIAGNOSIS — Q249 Congenital malformation of heart, unspecified: Secondary | ICD-10-CM | POA: Insufficient documentation

## 2013-10-25 ENCOUNTER — Emergency Department: Payer: Self-pay | Admitting: Emergency Medicine

## 2013-10-25 LAB — CBC
HCT: 31.4 % — AB (ref 35.0–47.0)
HGB: 10.3 g/dL — AB (ref 12.0–16.0)
MCH: 29.5 pg (ref 26.0–34.0)
MCHC: 32.8 g/dL (ref 32.0–36.0)
MCV: 90 fL (ref 80–100)
Platelet: 347 10*3/uL (ref 150–440)
RBC: 3.48 10*6/uL — AB (ref 3.80–5.20)
RDW: 15.8 % — ABNORMAL HIGH (ref 11.5–14.5)
WBC: 10.3 10*3/uL (ref 3.6–11.0)

## 2013-10-25 LAB — COMPREHENSIVE METABOLIC PANEL
ALT: 30 U/L (ref 12–78)
AST: 47 U/L — AB (ref 15–37)
Albumin: 2.8 g/dL — ABNORMAL LOW (ref 3.4–5.0)
Alkaline Phosphatase: 66 U/L
Anion Gap: 2 — ABNORMAL LOW (ref 7–16)
BUN: 9 mg/dL (ref 7–18)
Bilirubin,Total: 0.4 mg/dL (ref 0.2–1.0)
CO2: 25 mmol/L (ref 21–32)
Calcium, Total: 9 mg/dL (ref 8.5–10.1)
Chloride: 105 mmol/L (ref 98–107)
Creatinine: 0.9 mg/dL (ref 0.60–1.30)
EGFR (African American): >60
GLUCOSE: 107 mg/dL — AB (ref 65–99)
Osmolality: 264 (ref 275–301)
Potassium: 4 mmol/L (ref 3.5–5.1)
Sodium: 132 mmol/L — ABNORMAL LOW (ref 136–145)
TOTAL PROTEIN: 6.8 g/dL (ref 6.4–8.2)

## 2013-10-25 LAB — TROPONIN I

## 2013-10-25 IMAGING — CR DG CHEST 2V
1 series · 2 of 2 positions shown · non-contrast
Comparison: Two-view chest x-ray [DATE], [DATE],
[DATE]. CT chest [DATE].

CLINICAL DATA: Left-sided chest pain. Shortness of breath.
Intermittent cough. Current history of COPD.

EXAM:
CHEST  2 VIEW

[Series 6: w chest pa · 0.14mm/px · 2 of 2 slices shown]
[im 1/2]
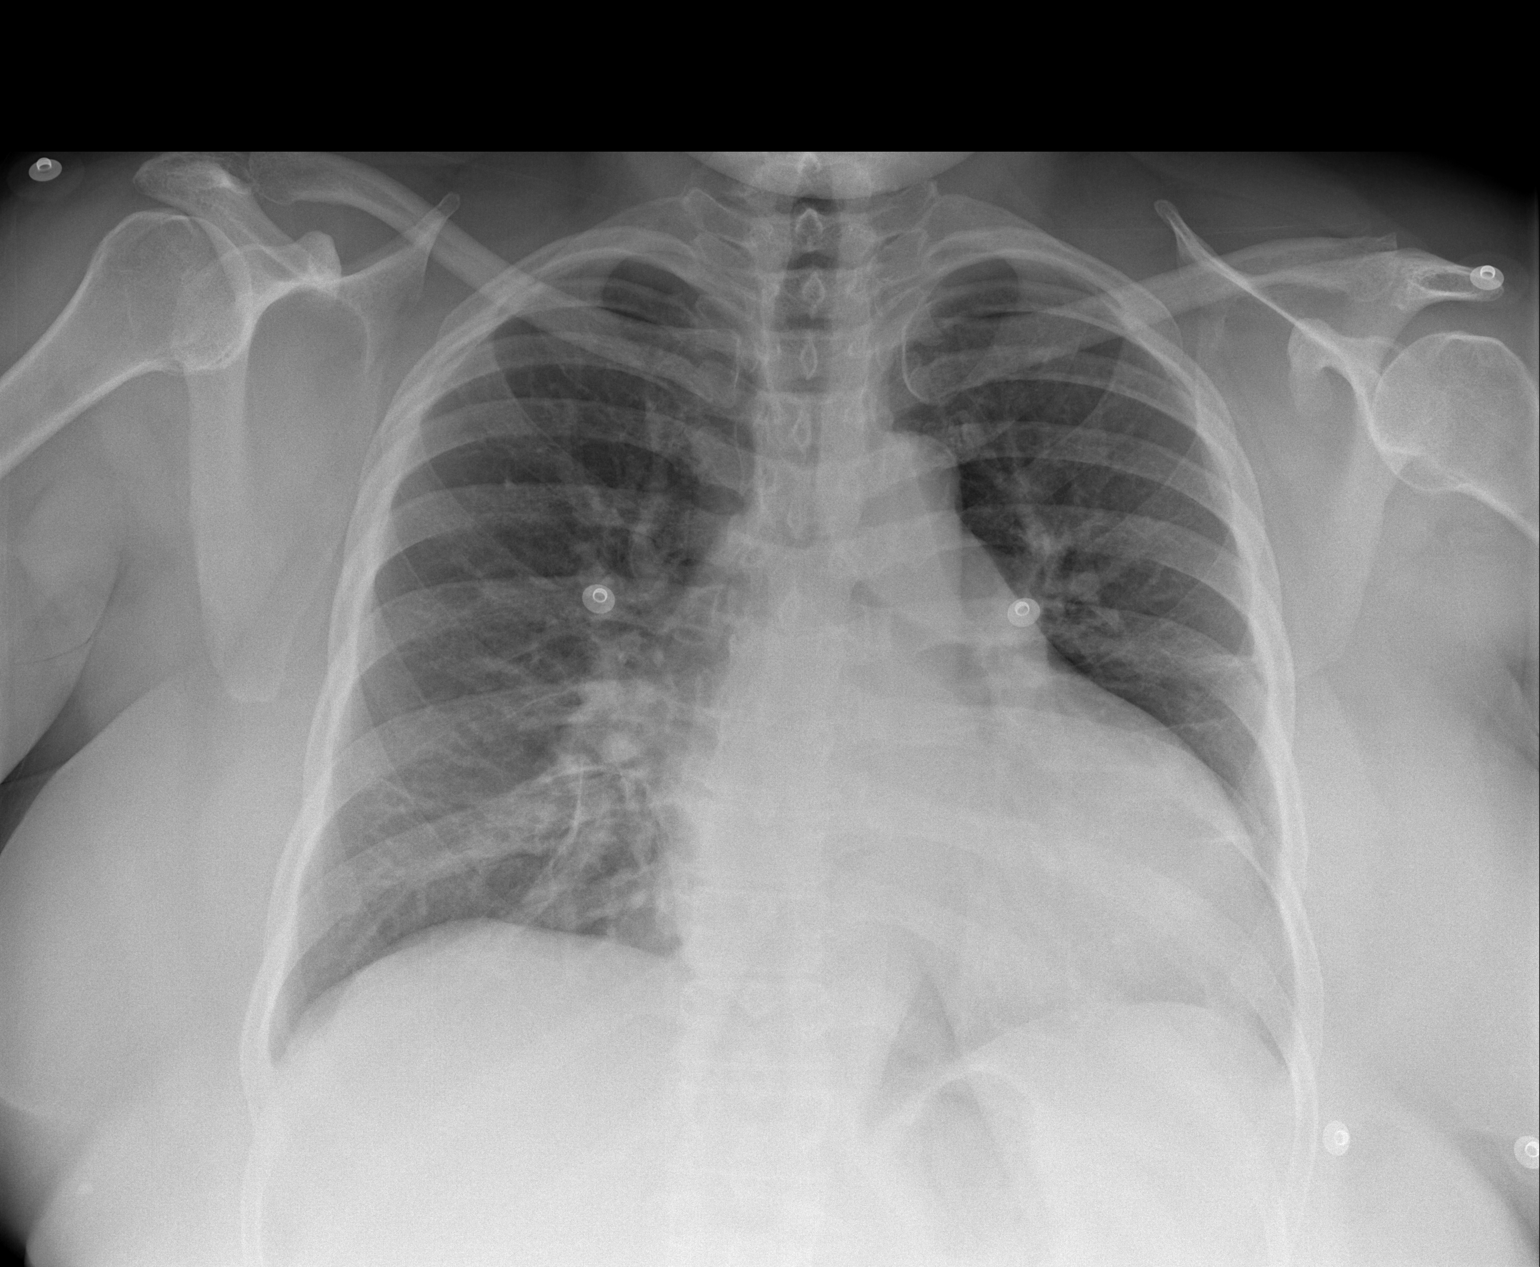
[im 2/2]
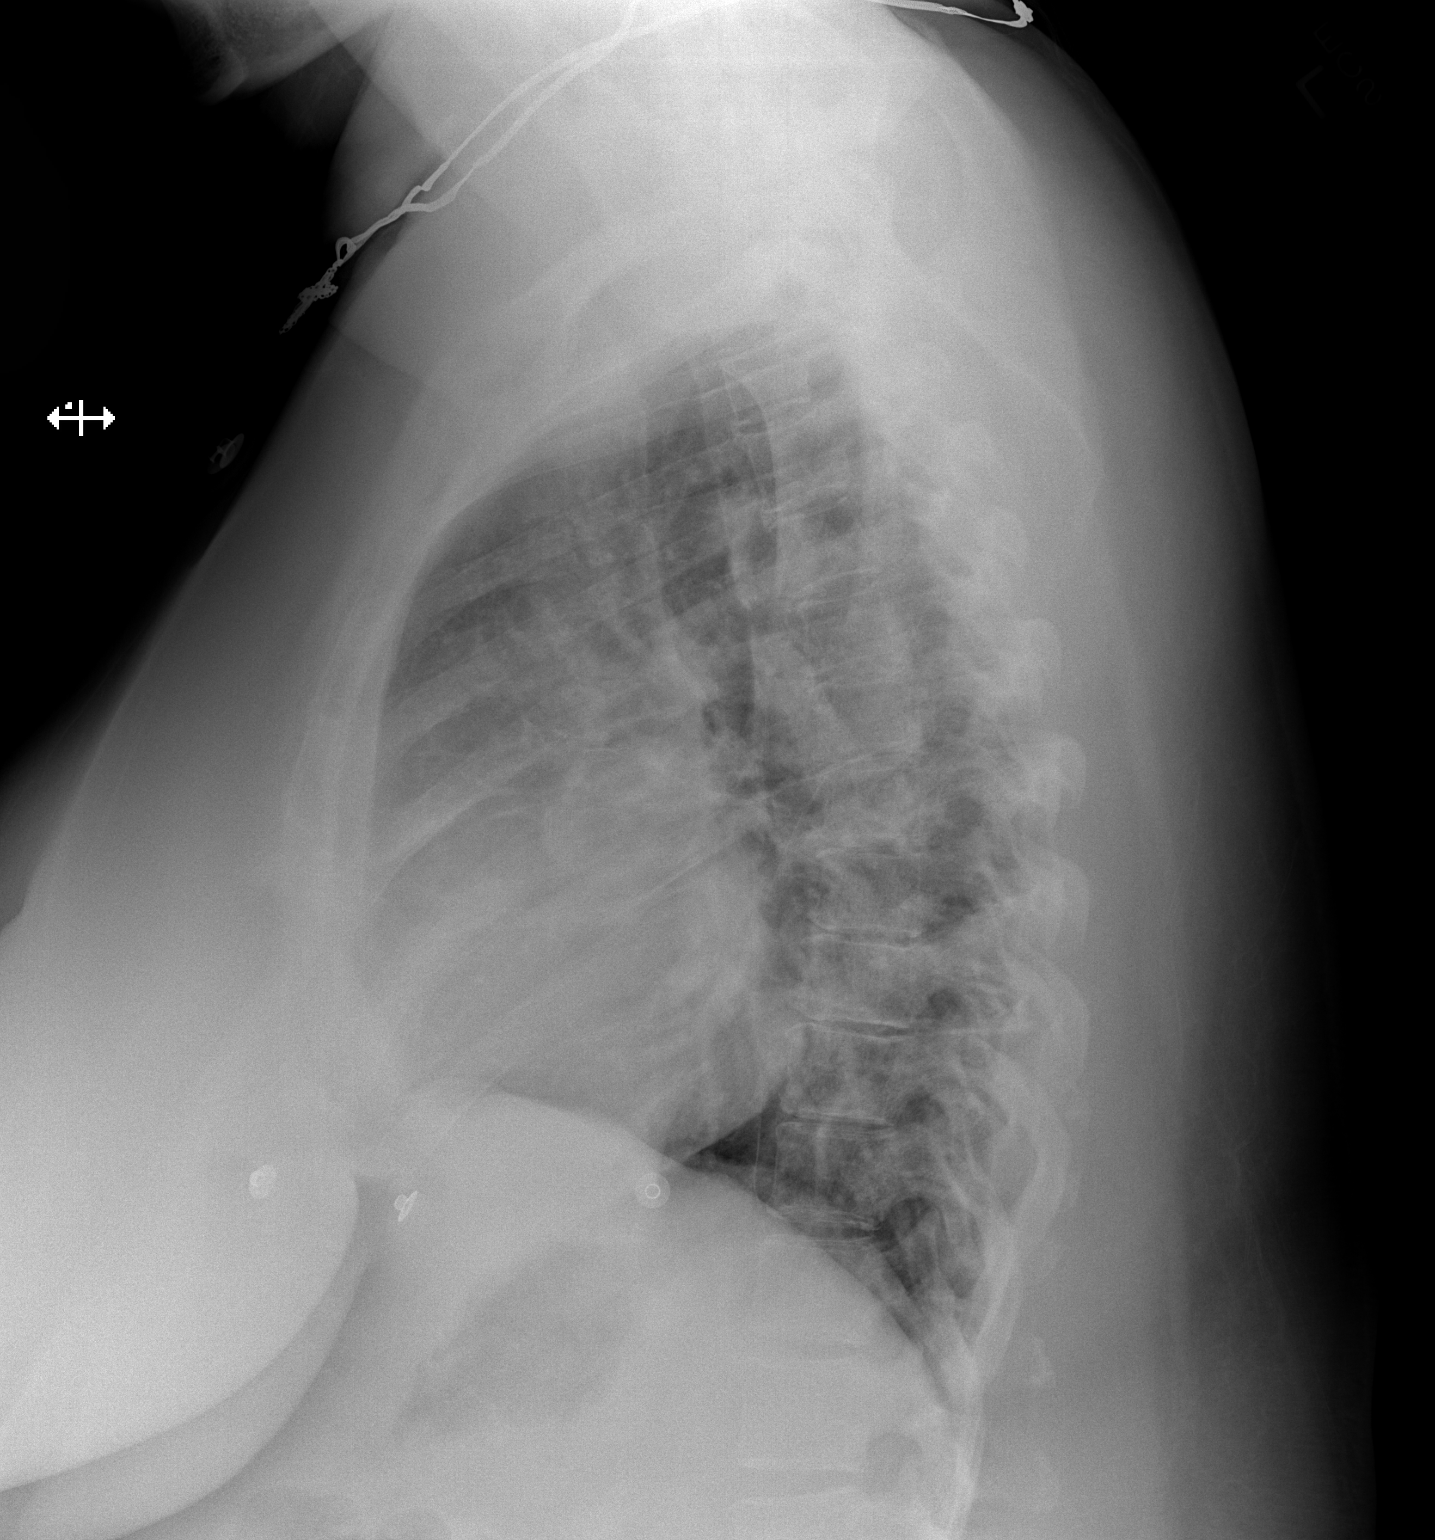

[2 of 2 positions shown; findings below may reference images not displayed]

FINDINGS: Cardiac silhouette moderately enlarged but stable. Thoracic aorta
mildly atherosclerotic and tortuous, unchanged. Hilar and
mediastinal contours otherwise unremarkable. Pulmonary vascularity
normal. Prominent bronchovascular markings diffusely and moderate
central peribronchial thickening, similar to prior examinations.
Linear scar versus atelectasis in the lower lobes, right middle lobe
and lingula. Lungs otherwise clear. No pneumothorax. No pleural
effusions. Mild degenerative changes involving the thoracic spine.
IMPRESSION: 1. Stable moderate changes of chronic bronchitis and/or asthma.
2. Linear atelectasis or scarring in the lower lobes, right middle
lobe and lingula. No acute cardiopulmonary disease otherwise.
3. Stable moderate cardiomegaly without pulmonary edema.

## 2013-11-27 ENCOUNTER — Ambulatory Visit: Payer: Medicare Other | Admitting: Podiatry

## 2013-12-04 ENCOUNTER — Ambulatory Visit (INDEPENDENT_AMBULATORY_CARE_PROVIDER_SITE_OTHER): Payer: Medicare Other | Admitting: Podiatry

## 2013-12-04 VITALS — BP 160/77 | HR 81 | Resp 16

## 2013-12-04 DIAGNOSIS — B351 Tinea unguium: Secondary | ICD-10-CM

## 2013-12-04 DIAGNOSIS — M722 Plantar fascial fibromatosis: Secondary | ICD-10-CM

## 2013-12-04 DIAGNOSIS — M79609 Pain in unspecified limb: Secondary | ICD-10-CM

## 2013-12-04 DIAGNOSIS — M79676 Pain in unspecified toe(s): Secondary | ICD-10-CM

## 2013-12-04 NOTE — Progress Notes (Signed)
She presents today with a chief complaint of bilateral plantar fasciitis states that the shots we gave her last time made her feel much much better. She's also complaining of painful E. elongated toenails.  Objective: Vital signs are stable she is alert and oriented x3. Pulses are strongly palpable bilateral. Neurologic sensorium is intact per since once the monofilament. Deep tendon reflexes are intact. She has pain on palpation medial continued tubercles bilateral heels. Her nails are thick yellow dystrophic onychomycotic and painful palpation.  Assessment: Pain in limb secondary to plantar fasciitis bilateral. Pain in limb secondary to onychomycosis.  Plan: Injected bilateral heels today with Kenalog and local anesthetic and debrided her nails 1 through 5 bilateral covered service secondary to pain.

## 2014-01-01 LAB — COMPREHENSIVE METABOLIC PANEL
ALBUMIN: 3.4 g/dL (ref 3.4–5.0)
ALK PHOS: 72 U/L
ALT: 28 U/L
ANION GAP: 4 — AB (ref 7–16)
AST: 30 U/L (ref 15–37)
BILIRUBIN TOTAL: 0.3 mg/dL (ref 0.2–1.0)
BUN: 12 mg/dL (ref 7–18)
CALCIUM: 8.5 mg/dL (ref 8.5–10.1)
CO2: 34 mmol/L — AB (ref 21–32)
Chloride: 104 mmol/L (ref 98–107)
Creatinine: 1.04 mg/dL (ref 0.60–1.30)
EGFR (Non-African Amer.): 59 — ABNORMAL LOW
Glucose: 98 mg/dL (ref 65–99)
Osmolality: 283 (ref 275–301)
POTASSIUM: 3.3 mmol/L — AB (ref 3.5–5.1)
SODIUM: 142 mmol/L (ref 136–145)
TOTAL PROTEIN: 7.2 g/dL (ref 6.4–8.2)

## 2014-01-01 LAB — PRO B NATRIURETIC PEPTIDE: B-TYPE NATIURETIC PEPTID: 259 pg/mL — AB (ref 0–125)

## 2014-01-01 LAB — CBC
HCT: 33.6 % — AB (ref 35.0–47.0)
HGB: 10.7 g/dL — ABNORMAL LOW (ref 12.0–16.0)
MCH: 28.7 pg (ref 26.0–34.0)
MCHC: 31.9 g/dL — AB (ref 32.0–36.0)
MCV: 90 fL (ref 80–100)
PLATELETS: 317 10*3/uL (ref 150–440)
RBC: 3.73 10*6/uL — ABNORMAL LOW (ref 3.80–5.20)
RDW: 14.8 % — ABNORMAL HIGH (ref 11.5–14.5)
WBC: 10.2 10*3/uL (ref 3.6–11.0)

## 2014-01-01 LAB — CK TOTAL AND CKMB (NOT AT ARMC)
CK, Total: 278 U/L — ABNORMAL HIGH
CK-MB: 2.4 ng/mL (ref 0.5–3.6)

## 2014-01-01 LAB — TROPONIN I: Troponin-I: <0.02 ng/mL

## 2014-01-01 IMAGING — CR DG CHEST 2V
1 series · 2 of 2 positions shown · non-contrast
Comparison: Chest radiograph [DATE]

CLINICAL DATA: Chest pain for 2 days, history of cardiac surgery in
[52].

EXAM:
CHEST  2 VIEW

[Series 1: dxr chest pa (or ap) and lateral · 0.14mm/px · 2 of 2 slices shown]
[im 1/2]
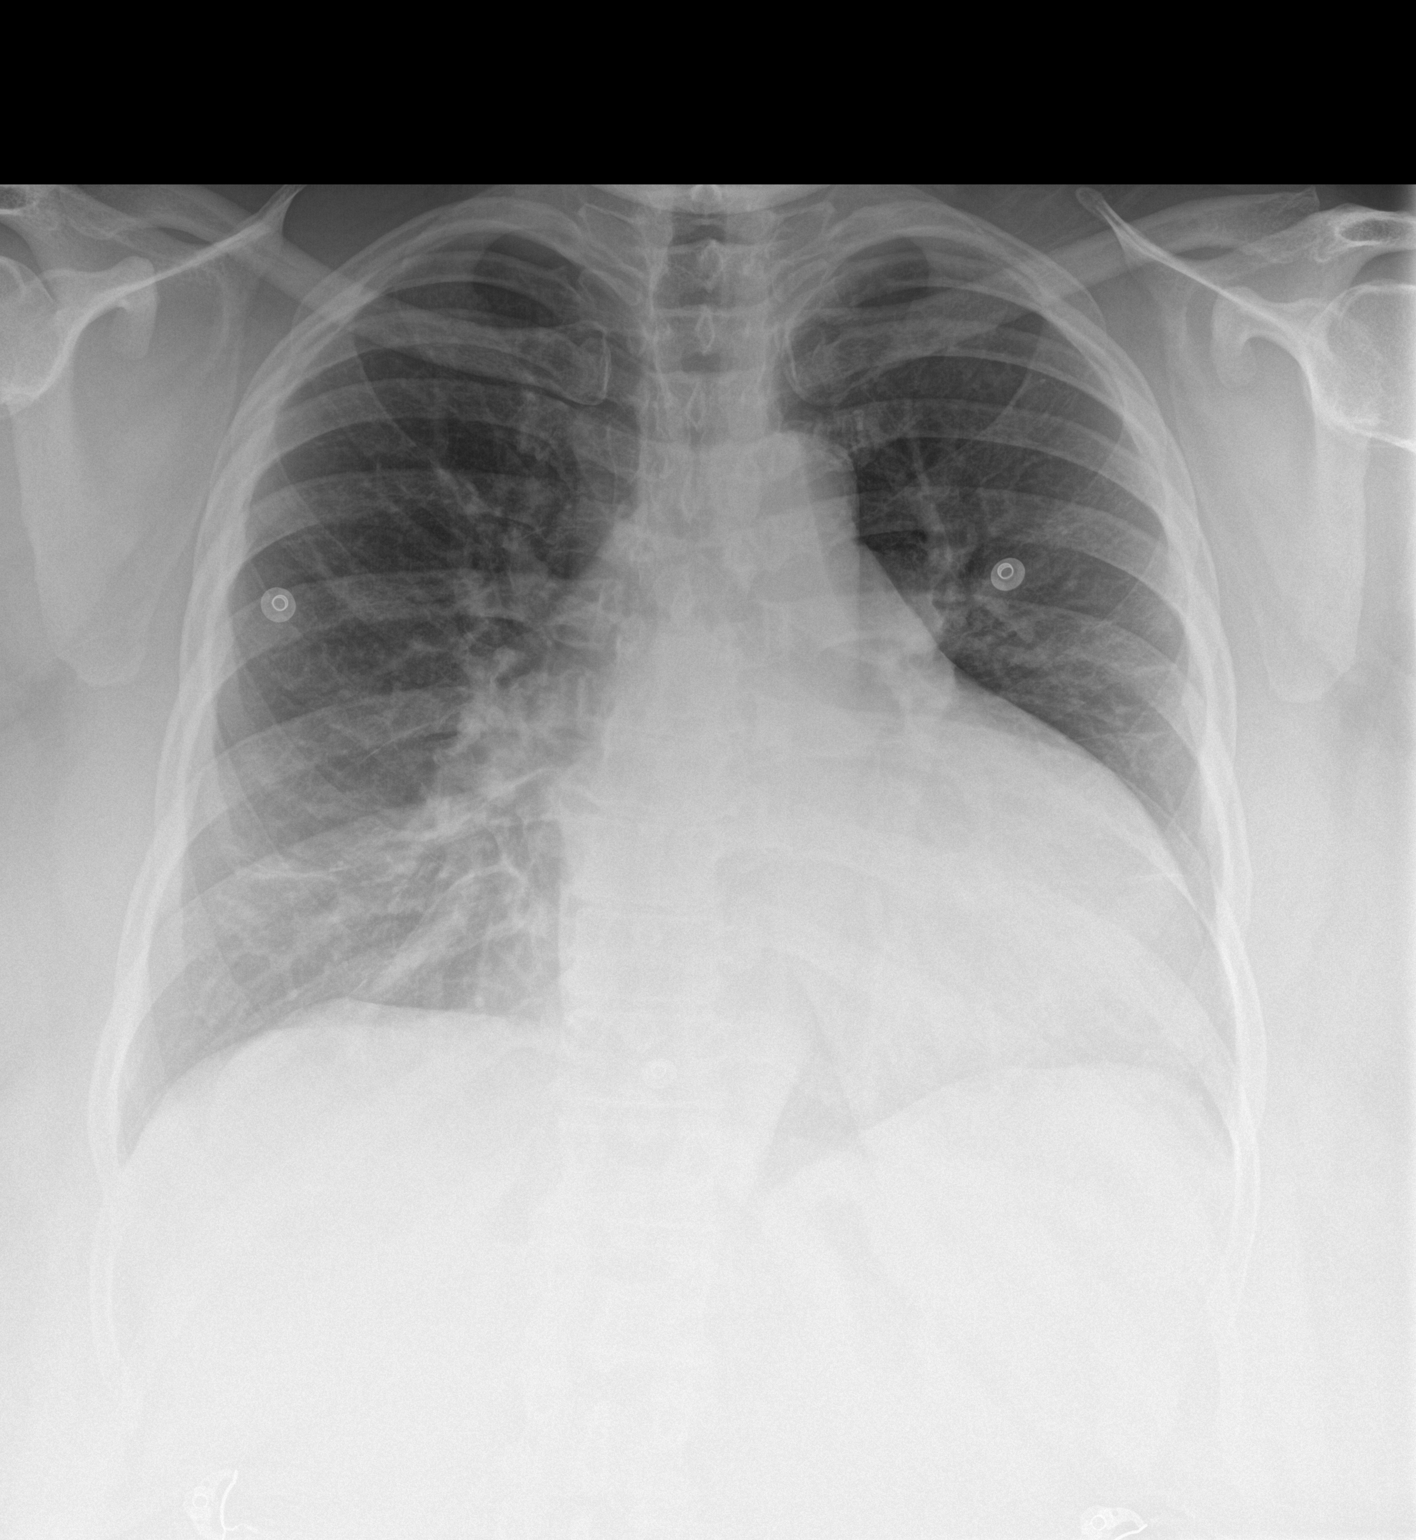
[im 2/2]
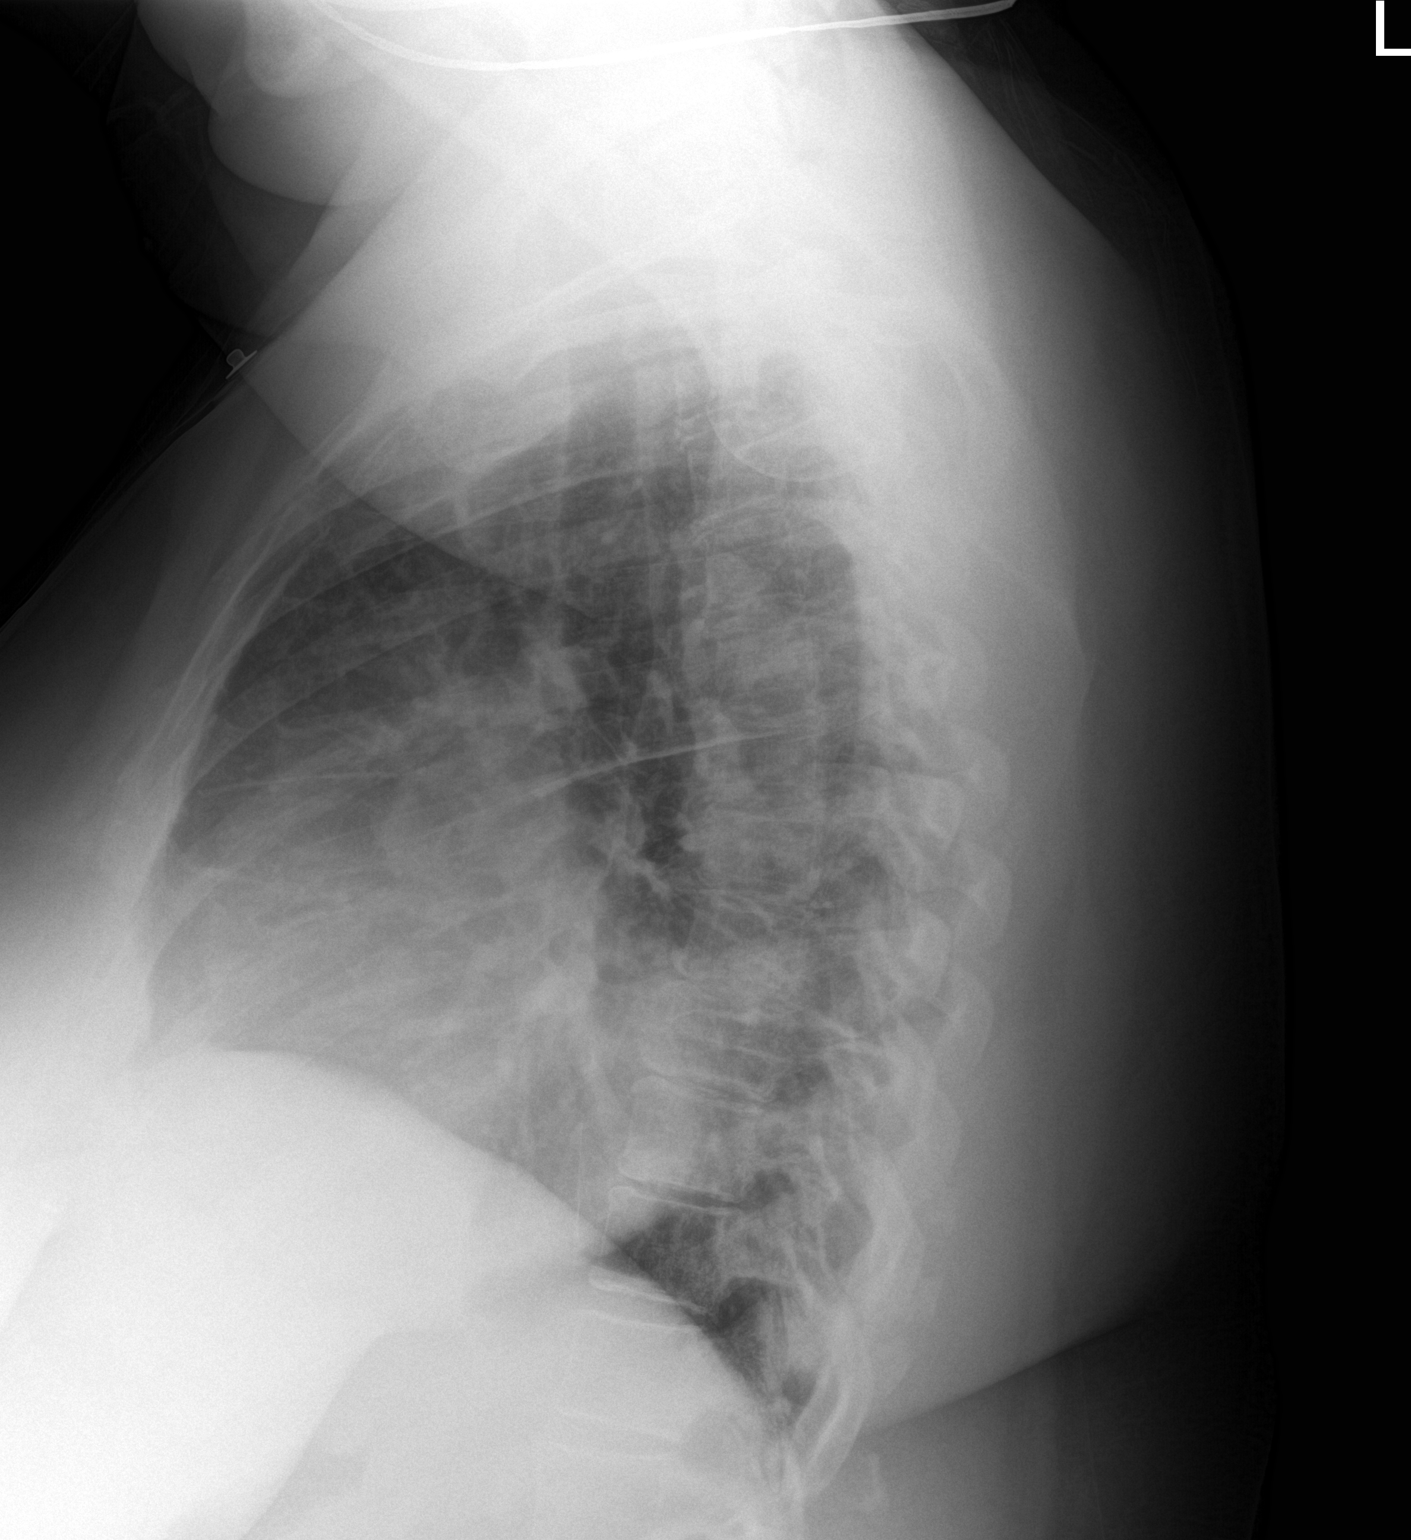

[2 of 2 positions shown; findings below may reference images not displayed]

FINDINGS: The cardiac silhouette remains moderately enlarged, mediastinal
silhouette is nonsuspicious, mildly calcified aortic knob. Stable
mild bronchitic changes. No pleural effusions or focal
consolidations. Stable linear scarring in the LEFT midlung zone and
bilateral lower lobes. No pleural effusions or focal consolidation.
No pneumothorax. Soft tissue planes and included osseous structures
are nonsuspicious.
IMPRESSION: Stable cardiomegaly. Stable mild bronchitic changes without focal
consolidation.

  By: IDAMARI

## 2014-01-02 ENCOUNTER — Observation Stay: Payer: Self-pay | Admitting: Internal Medicine

## 2014-01-02 LAB — CK-MB
CK-MB: 2 ng/mL (ref 0.5–3.6)
CK-MB: 1.7 ng/mL (ref 0.5–3.6)

## 2014-01-02 LAB — TROPONIN I: Troponin-I: <0.02 ng/mL

## 2014-01-18 ENCOUNTER — Emergency Department: Payer: Self-pay | Admitting: Internal Medicine

## 2014-01-18 LAB — COMPREHENSIVE METABOLIC PANEL
ANION GAP: 6 — AB (ref 7–16)
AST: 28 U/L (ref 15–37)
Albumin: 3.2 g/dL — ABNORMAL LOW (ref 3.4–5.0)
Alkaline Phosphatase: 86 U/L
BUN: 8 mg/dL (ref 7–18)
Bilirubin,Total: 0.3 mg/dL (ref 0.2–1.0)
CREATININE: 1.05 mg/dL (ref 0.60–1.30)
Calcium, Total: 9.1 mg/dL (ref 8.5–10.1)
Chloride: 105 mmol/L (ref 98–107)
Co2: 32 mmol/L (ref 21–32)
EGFR (Non-African Amer.): 58 — ABNORMAL LOW
GLUCOSE: 146 mg/dL — AB (ref 65–99)
OSMOLALITY: 286 (ref 275–301)
Potassium: 3.8 mmol/L (ref 3.5–5.1)
SGPT (ALT): 29 U/L
Sodium: 143 mmol/L (ref 136–145)
Total Protein: 7.4 g/dL (ref 6.4–8.2)

## 2014-01-18 LAB — CBC WITH DIFFERENTIAL/PLATELET
Basophil #: 0 10*3/uL (ref 0.0–0.1)
Basophil %: 0.5 %
EOS ABS: 0.2 10*3/uL (ref 0.0–0.7)
EOS PCT: 2.2 %
HCT: 34.5 % — ABNORMAL LOW (ref 35.0–47.0)
HGB: 10.7 g/dL — ABNORMAL LOW (ref 12.0–16.0)
Lymphocyte #: 1.5 10*3/uL (ref 1.0–3.6)
Lymphocyte %: 18.4 %
MCH: 28.5 pg (ref 26.0–34.0)
MCHC: 31 g/dL — ABNORMAL LOW (ref 32.0–36.0)
MCV: 92 fL (ref 80–100)
Monocyte #: 0.5 x10 3/mm (ref 0.2–0.9)
Monocyte %: 6.2 %
NEUTROS ABS: 5.7 10*3/uL (ref 1.4–6.5)
Neutrophil %: 72.7 %
Platelet: 313 10*3/uL (ref 150–440)
RBC: 3.75 10*6/uL — ABNORMAL LOW (ref 3.80–5.20)
RDW: 15 % — ABNORMAL HIGH (ref 11.5–14.5)
WBC: 7.9 10*3/uL (ref 3.6–11.0)

## 2014-01-18 LAB — URINALYSIS, COMPLETE
BACTERIA: NONE SEEN
BILIRUBIN, UR: NEGATIVE
Blood: NEGATIVE
GLUCOSE, UR: NEGATIVE mg/dL (ref 0–75)
KETONE: NEGATIVE
Leukocyte Esterase: NEGATIVE
Nitrite: NEGATIVE
PROTEIN: NEGATIVE
Ph: 5 (ref 4.5–8.0)
Specific Gravity: 1.003 (ref 1.003–1.030)
Squamous Epithelial: <1

## 2014-01-18 IMAGING — CR DG ABDOMEN 3V
1 series · 4 of 4 positions shown · non-contrast
Comparison: CT abdomen and pelvis [DATE]

CLINICAL DATA: Abdominal pain.  Biopsy is cervix 1 week prior

EXAM:
ABDOMEN SERIES

[Series 1: dxr abdomen 3-way (incl pa cxr) · 0.14mm/px · 4 of 4 slices shown]
[im 1/4]
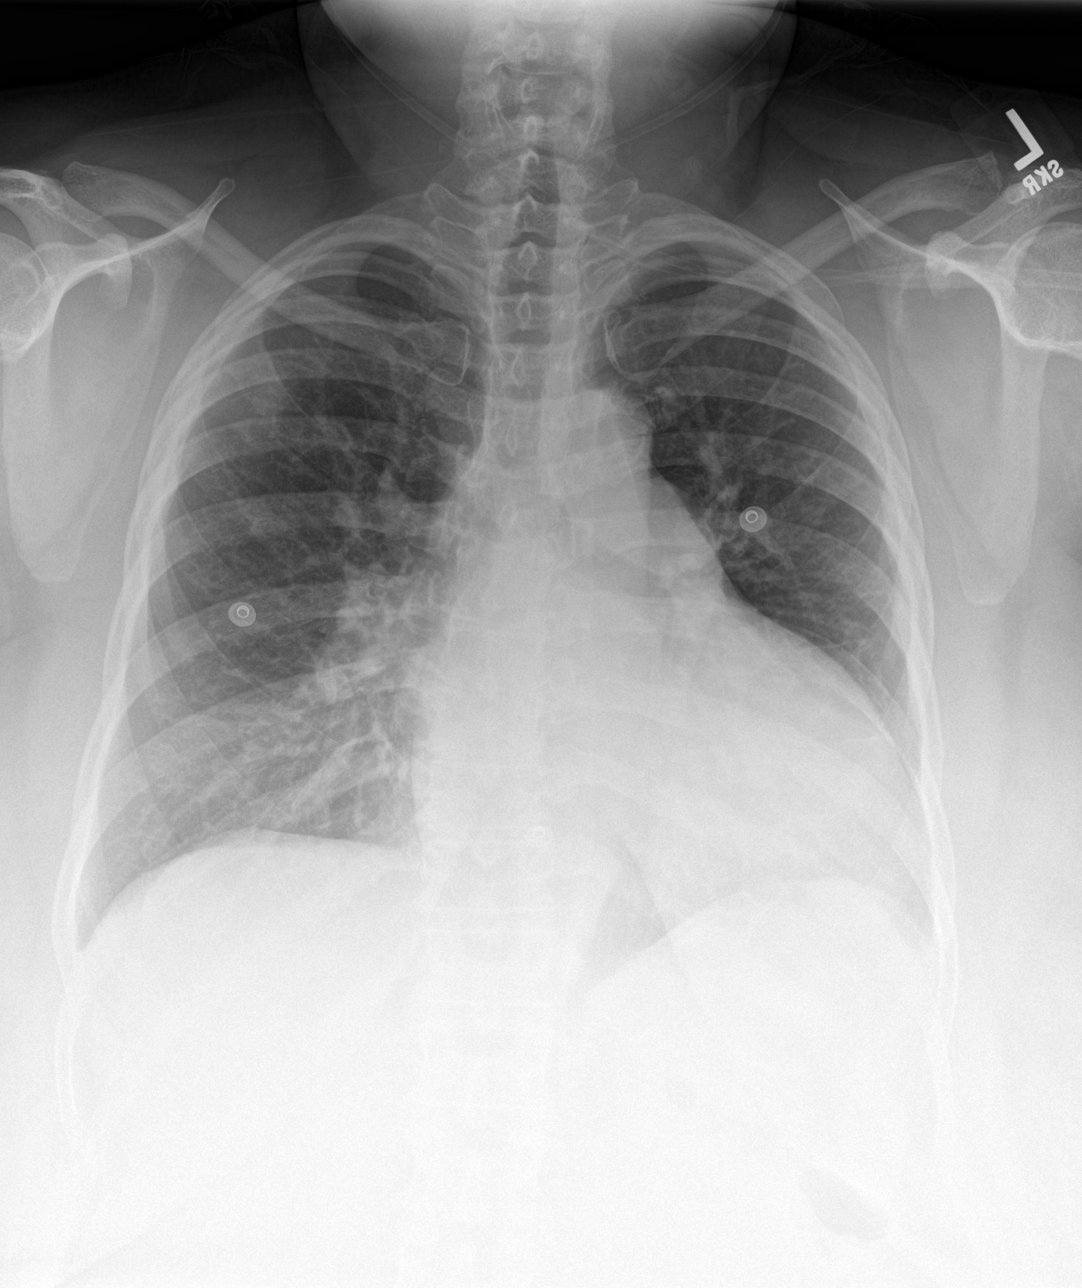
[im 2/4]
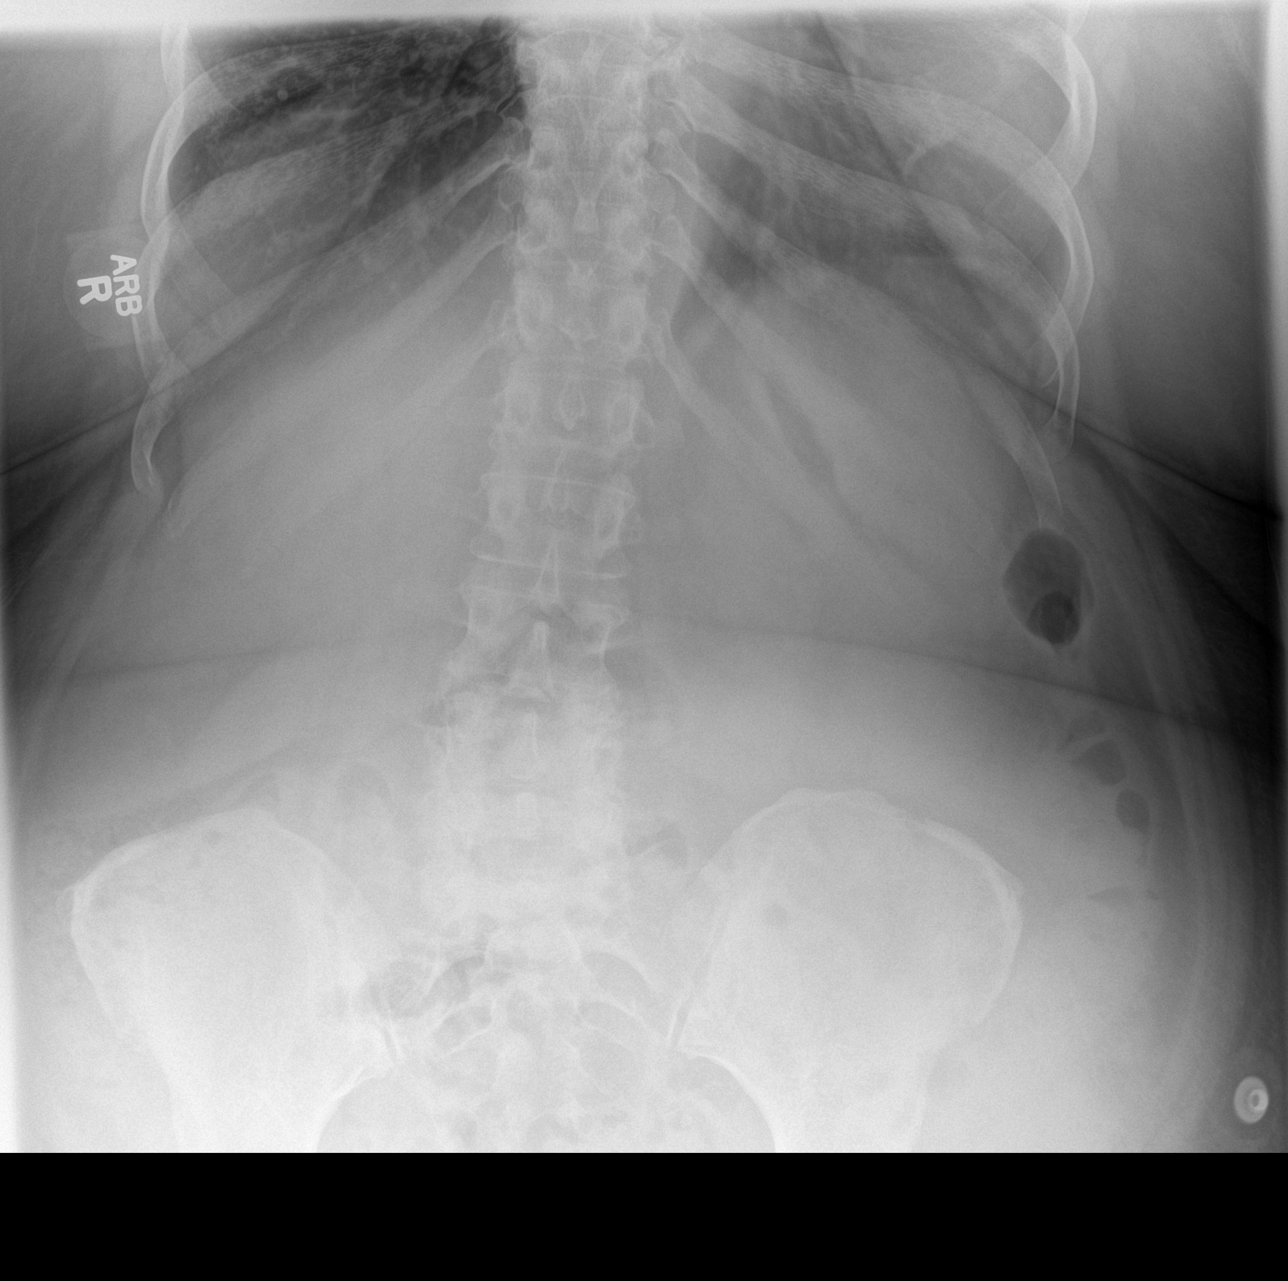
[im 3/4]
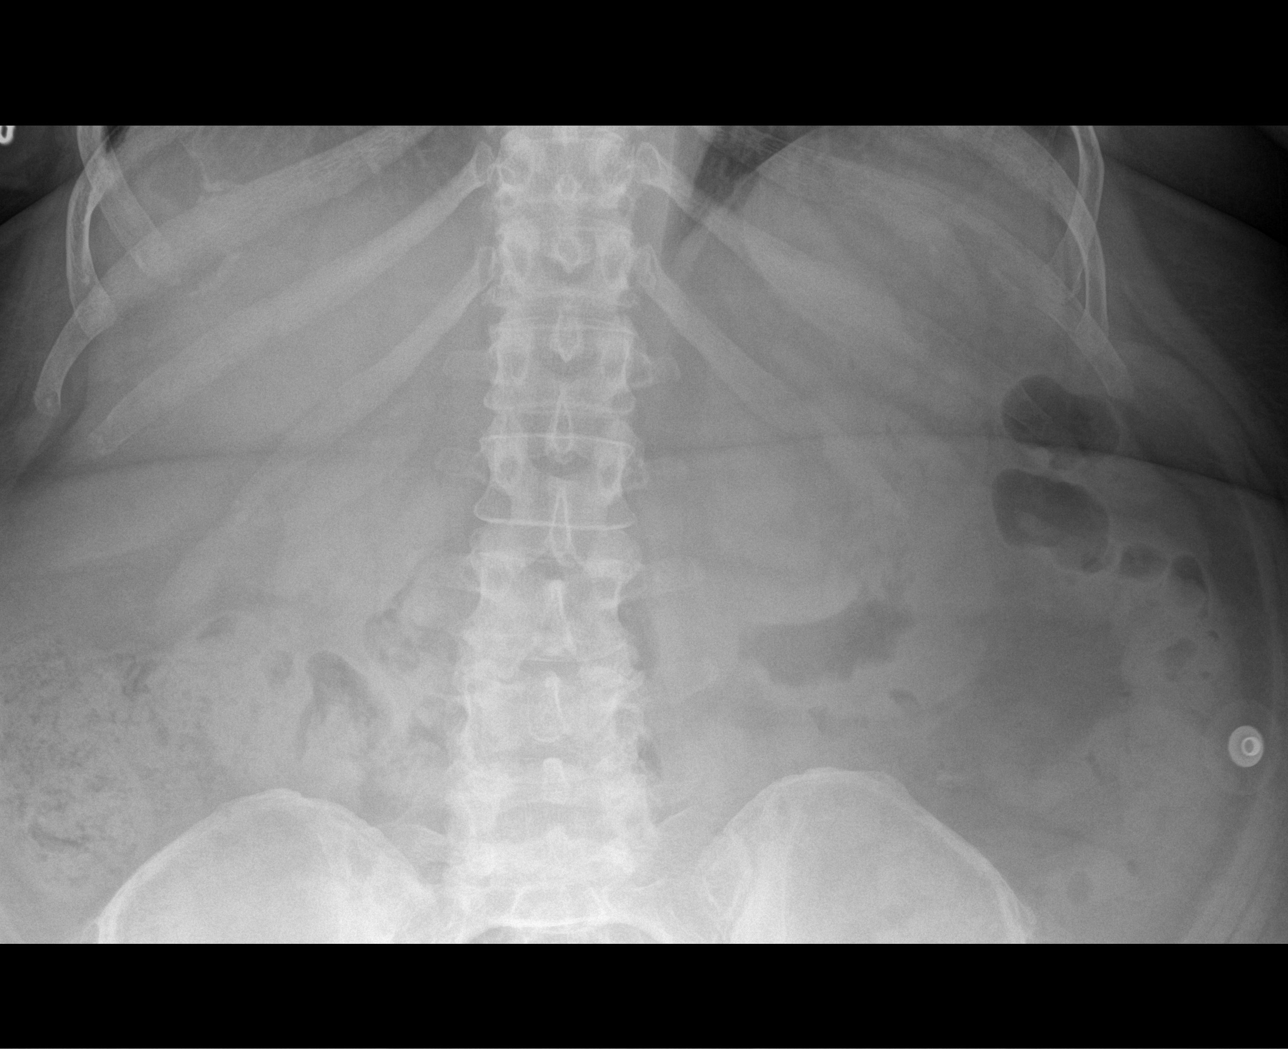
[im 4/4]
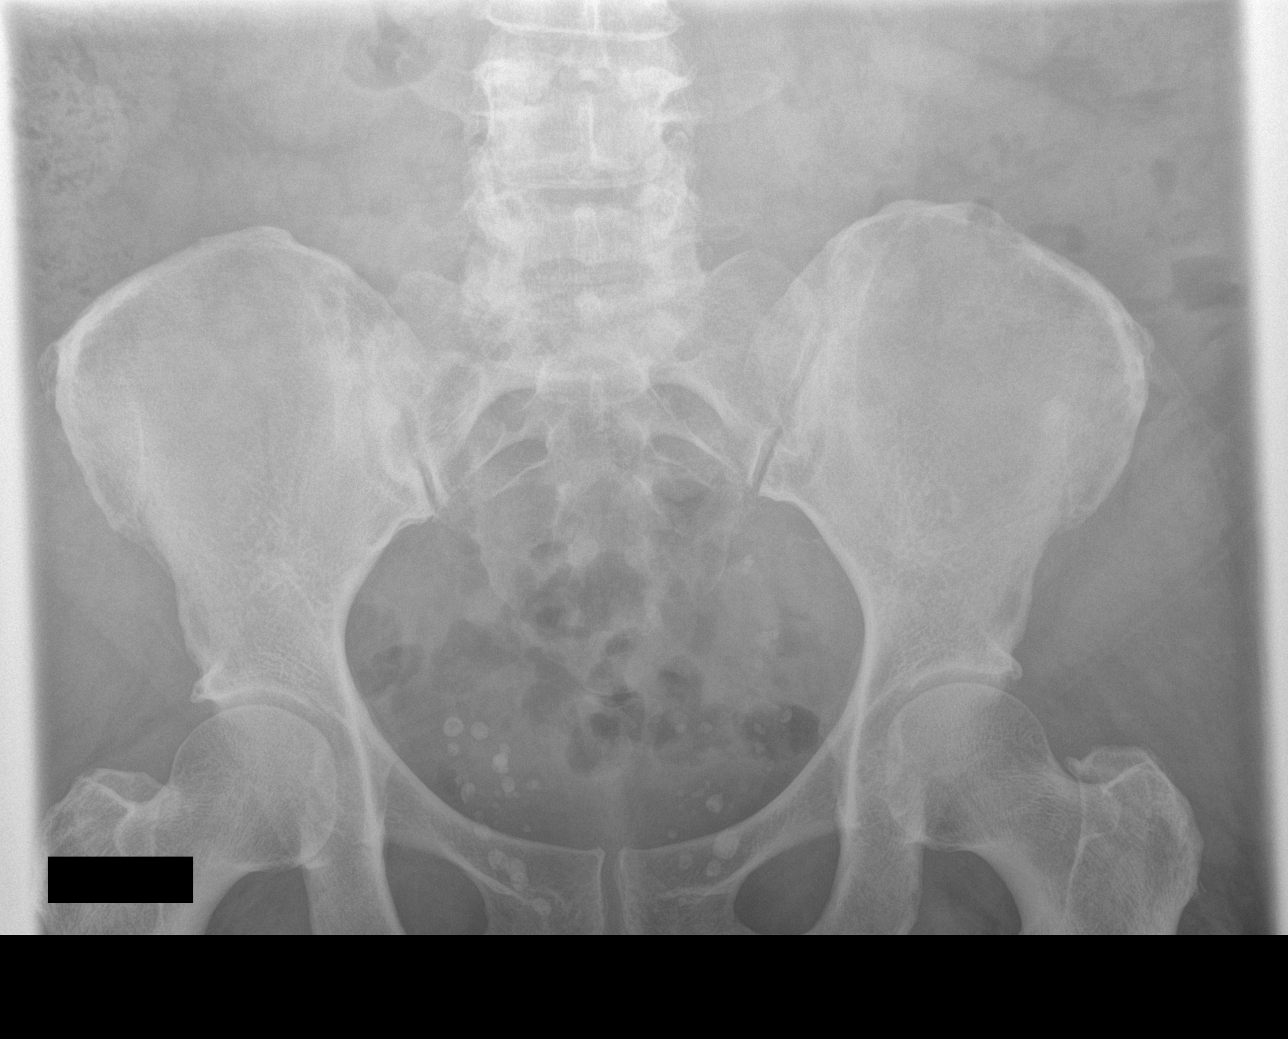

[4 of 4 positions shown; findings below may reference images not displayed]

FINDINGS: PA chest: Lungs are clear. Heart is enlarged with pulmonary
vascularity within normal limits. No adenopathy.

Supine and upright abdomen: There is moderate stool throughout
colon. Bowel gas pattern is unremarkable. No obstruction or free
air. Multiple pelvic calcifications are felt to represent
phleboliths.
IMPRESSION: Moderate stool in colon. Overall bowel gas pattern unremarkable.
Lungs clear. There is cardiac enlargement.

## 2014-01-28 IMAGING — US US EXTREM LOW VENOUS*L*
1 series · 13 of 24 positions shown · non-contrast
Comparison: None.

CLINICAL DATA: Left lower extremity pain and edema

EXAM:
LEFT LOWER EXTREMITY VENOUS DUPLEX ULTRASOUND
TECHNIQUE: Gray-scale sonography with graded compression, as well as color
Doppler and duplex ultrasound were performed to evaluate the left
lower extremity deep venous system from the level of the common
femoral vein and including the common femoral, femoral, profunda
femoral, popliteal and calf veins including the posterior tibial,
peroneal and gastrocnemius veins when visible. The superficial great
saphenous vein was also interrogated. Spectral Doppler was utilized
to evaluate flow at rest and with distal augmentation maneuvers in
the common femoral, femoral and popliteal veins.

[Series 1: us extrem low venous*left* · 0.08mm/px · 13 of 33 slices shown]
[im 1/33]
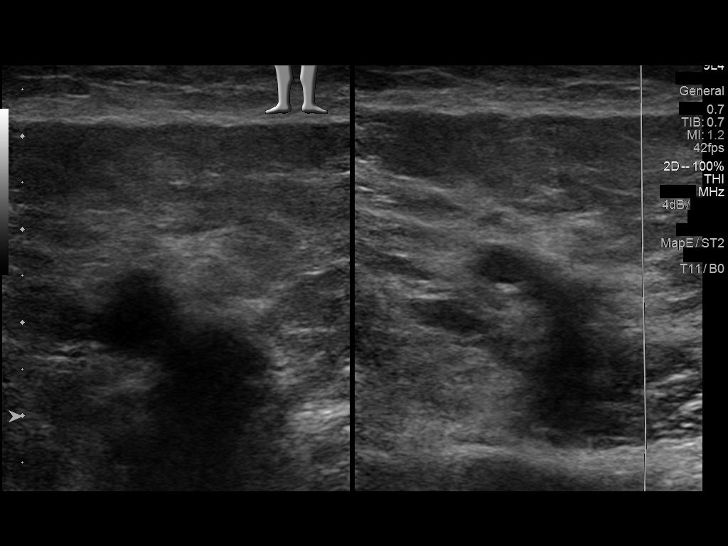
[im 3/33]
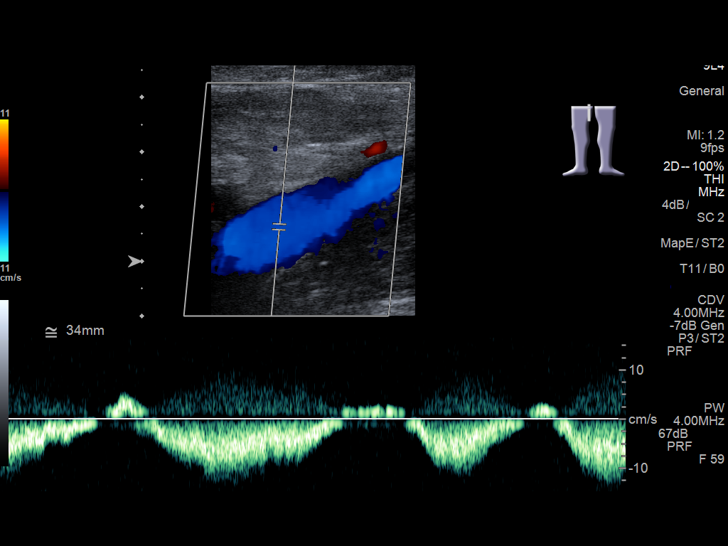
[im 6/33]
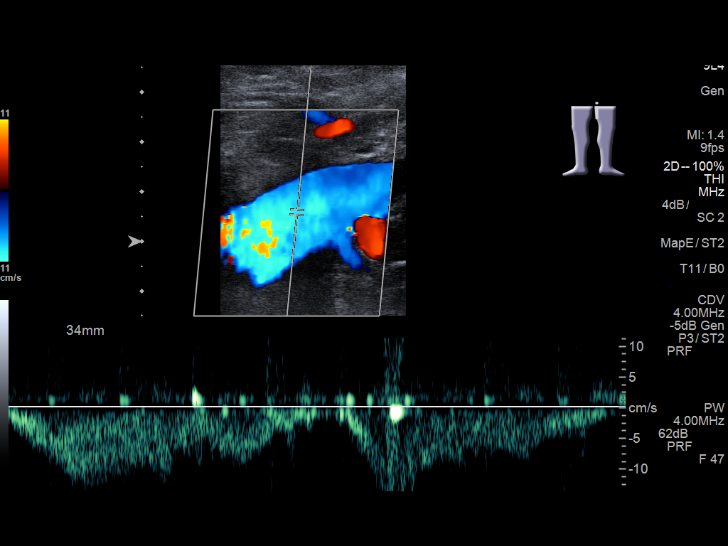
[im 9/33]
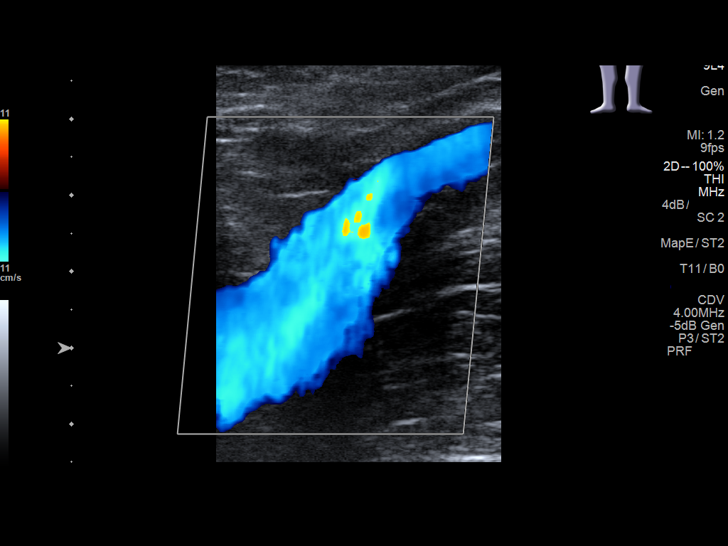
[im 12/33]
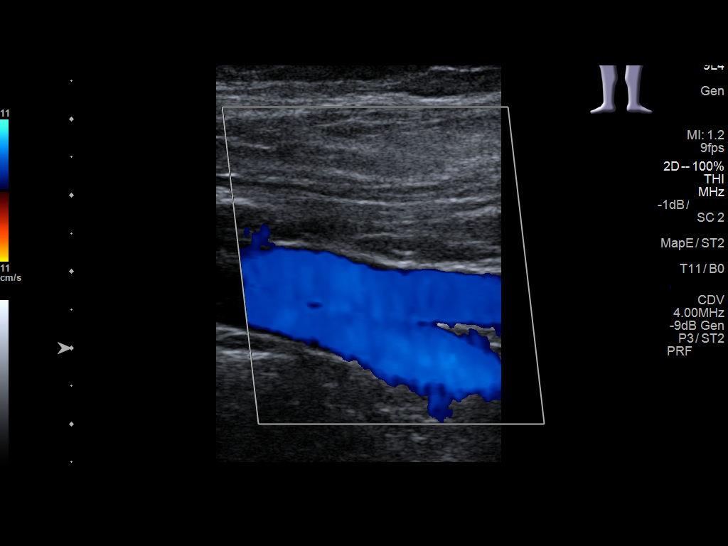
[im 14/33]
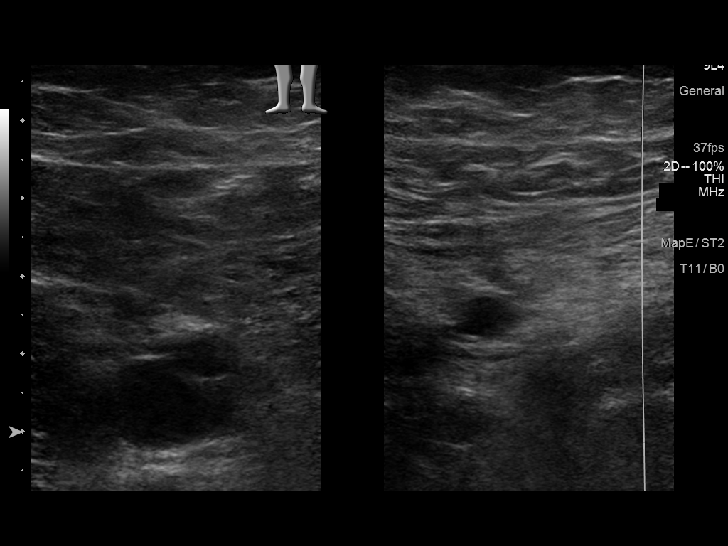
[im 17/33]
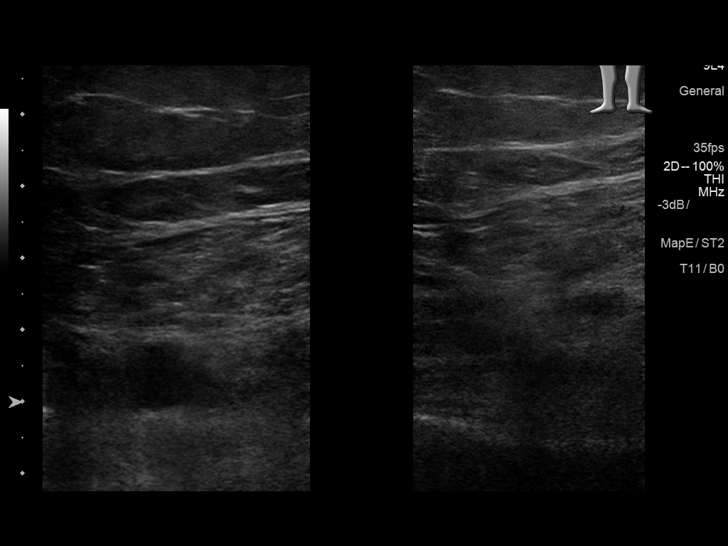
[im 19/33]
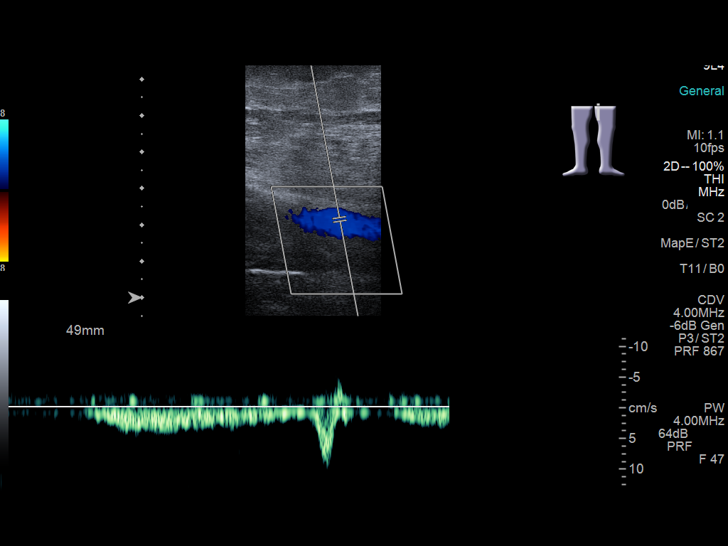
[im 21/33]
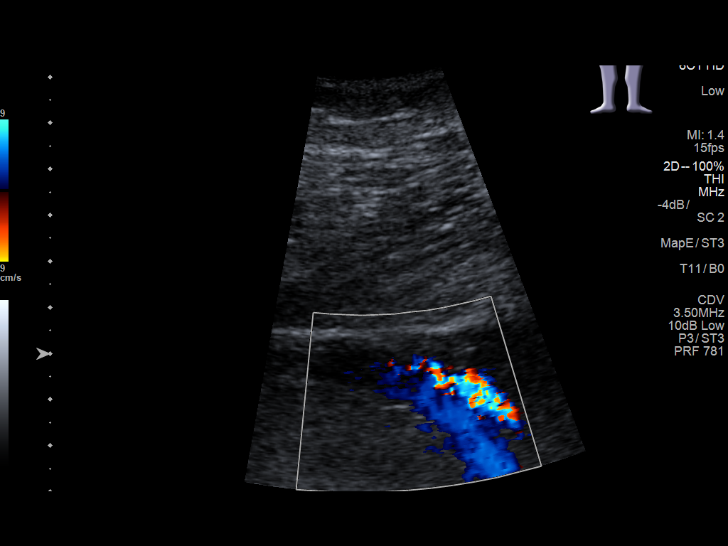
[im 24/33]
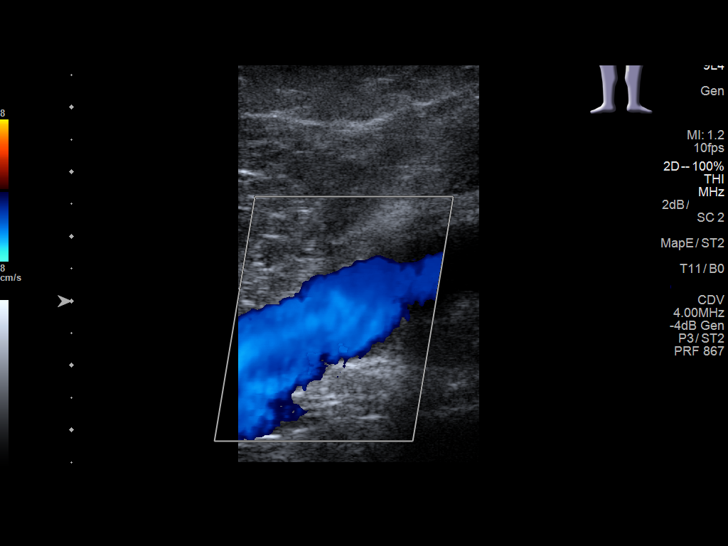
[im 27/33]
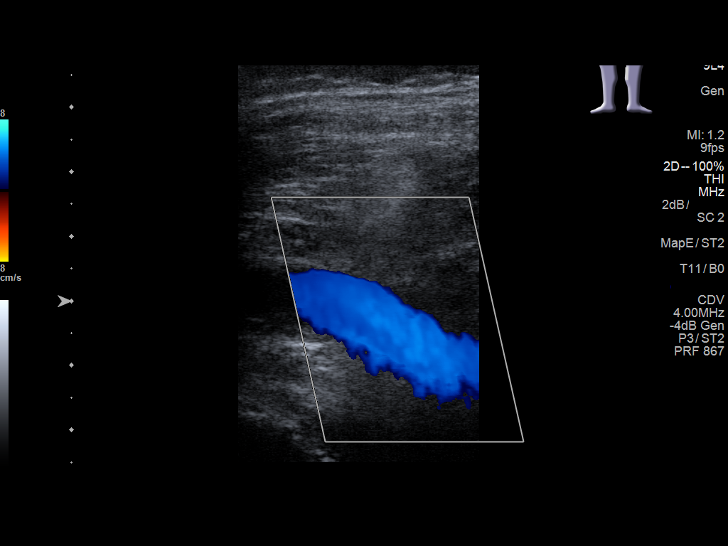
[im 30/33]
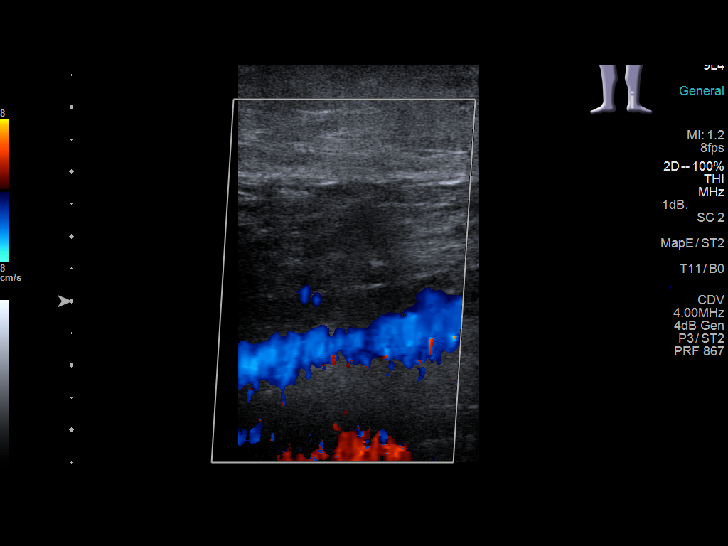
[im 33/33]
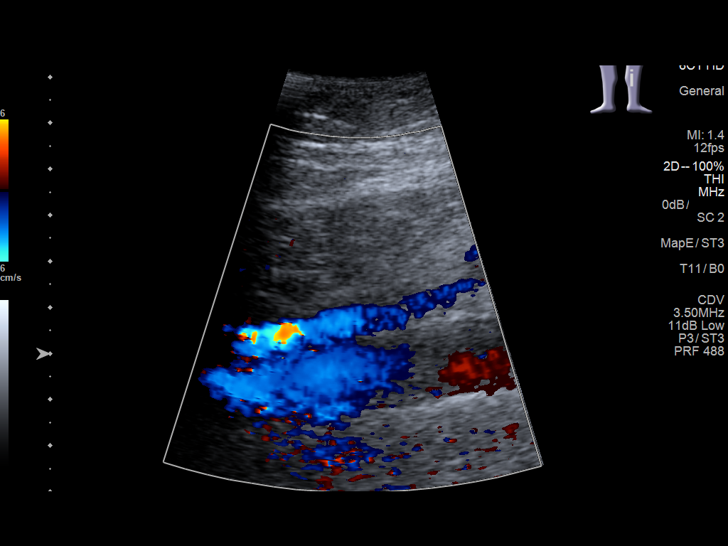

[13 of 24 positions shown; findings below may reference images not displayed]

FINDINGS: Contralateral Common Femoral Vein: Respiratory phasicity is normal
and symmetric with the symptomatic side. No evidence of thrombus.
Normal compressibility.

Common Femoral Vein: No evidence of thrombus. Normal
compressibility, respiratory phasicity and response to augmentation.

Saphenofemoral Junction: No evidence of thrombus. Normal
compressibility and flow on color Doppler imaging.

Profunda Femoral Vein: No evidence of thrombus. Normal
compressibility and flow on color Doppler imaging.

Femoral Vein: No evidence of thrombus. Normal compressibility,
respiratory phasicity and response to augmentation.

Popliteal Vein: No evidence of thrombus. Normal compressibility,
respiratory phasicity and response to augmentation.

Calf Veins: No evidence of thrombus in visualized regions. Left
peroneal vein not well seen due to soft tissue edema. Normal
compressibility and flow on color Doppler imaging.

Superficial Great Saphenous Vein: No evidence of thrombus. Normal
compressibility and flow on color Doppler imaging.

Venous Reflux:  None.

Other Findings:  There is left lower extremity edema.
IMPRESSION: No evidence of left lower extremity deep venous thrombosis. Note
that the left peroneal vein not well visualized due to overlying
edema. Right common femoral vein also patent.

## 2014-02-12 DIAGNOSIS — E782 Mixed hyperlipidemia: Secondary | ICD-10-CM | POA: Insufficient documentation

## 2014-03-05 ENCOUNTER — Ambulatory Visit: Payer: Medicare Other | Admitting: Podiatry

## 2014-03-19 ENCOUNTER — Ambulatory Visit (INDEPENDENT_AMBULATORY_CARE_PROVIDER_SITE_OTHER): Payer: Medicare Other | Admitting: Podiatry

## 2014-03-19 VITALS — BP 151/70 | HR 78 | Resp 16

## 2014-03-19 DIAGNOSIS — B351 Tinea unguium: Secondary | ICD-10-CM

## 2014-03-19 DIAGNOSIS — M79676 Pain in unspecified toe(s): Secondary | ICD-10-CM

## 2014-03-19 DIAGNOSIS — M722 Plantar fascial fibromatosis: Secondary | ICD-10-CM

## 2014-03-19 NOTE — Progress Notes (Signed)
Presents today chief complaint of painful elongated toenails. She also complains of bilateral heel pain.  Objective: Pulses are palpable bilateral nails are thick, yellow dystrophic onychomycosis and painful palpation. Pain on palpation at the plantar fascial insertion site bilateral heels.   Assessment: Onychomycosis with pain in limb. Bilateral plantar fasciitis.  Plan: Treatment of nails in thickness and length as covered service secondary to pain. Injected the bilateral heels today with Kenalog and local anesthetic follow-up with her in 3 months.

## 2014-04-20 ENCOUNTER — Emergency Department: Payer: Self-pay | Admitting: Student

## 2014-04-20 LAB — CBC
HCT: 34.1 % — ABNORMAL LOW (ref 35.0–47.0)
HGB: 10.7 g/dL — AB (ref 12.0–16.0)
MCH: 28.9 pg (ref 26.0–34.0)
MCHC: 31.4 g/dL — ABNORMAL LOW (ref 32.0–36.0)
MCV: 92 fL (ref 80–100)
PLATELETS: 295 10*3/uL (ref 150–440)
RBC: 3.7 10*6/uL — AB (ref 3.80–5.20)
RDW: 14.9 % — ABNORMAL HIGH (ref 11.5–14.5)
WBC: 9.2 10*3/uL (ref 3.6–11.0)

## 2014-04-20 LAB — D-DIMER(ARMC): D-DIMER: 379 ng/mL

## 2014-04-20 LAB — COMPREHENSIVE METABOLIC PANEL
ALBUMIN: 3.2 g/dL — AB (ref 3.4–5.0)
ALK PHOS: 80 U/L
Anion Gap: 8 (ref 7–16)
BILIRUBIN TOTAL: 0.2 mg/dL (ref 0.2–1.0)
BUN: 18 mg/dL (ref 7–18)
CO2: 29 mmol/L (ref 21–32)
CREATININE: 1.14 mg/dL (ref 0.60–1.30)
Calcium, Total: 8.6 mg/dL (ref 8.5–10.1)
Chloride: 103 mmol/L (ref 98–107)
EGFR (African American): >60
GFR CALC NON AF AMER: 53 — AB
Glucose: 153 mg/dL — ABNORMAL HIGH (ref 65–99)
OSMOLALITY: 284 (ref 275–301)
Potassium: 3.6 mmol/L (ref 3.5–5.1)
SGOT(AST): 21 U/L (ref 15–37)
SGPT (ALT): 23 U/L
SODIUM: 140 mmol/L (ref 136–145)
TOTAL PROTEIN: 6.9 g/dL (ref 6.4–8.2)

## 2014-04-20 LAB — TROPONIN I

## 2014-04-20 LAB — HCG, QUANTITATIVE, PREGNANCY: Beta Hcg, Quant.: 1 m[IU]/mL

## 2014-04-20 IMAGING — CR DG CHEST 2V
1 series · 2 of 2 positions shown · non-contrast
Comparison: [DATE]

CLINICAL DATA: Progressive difficulty breathing

EXAM:
CHEST  2 VIEW

[Series 1: dxr chest pa (or ap) and lateral · 0.14mm/px · 2 of 2 slices shown]
[im 1/2]
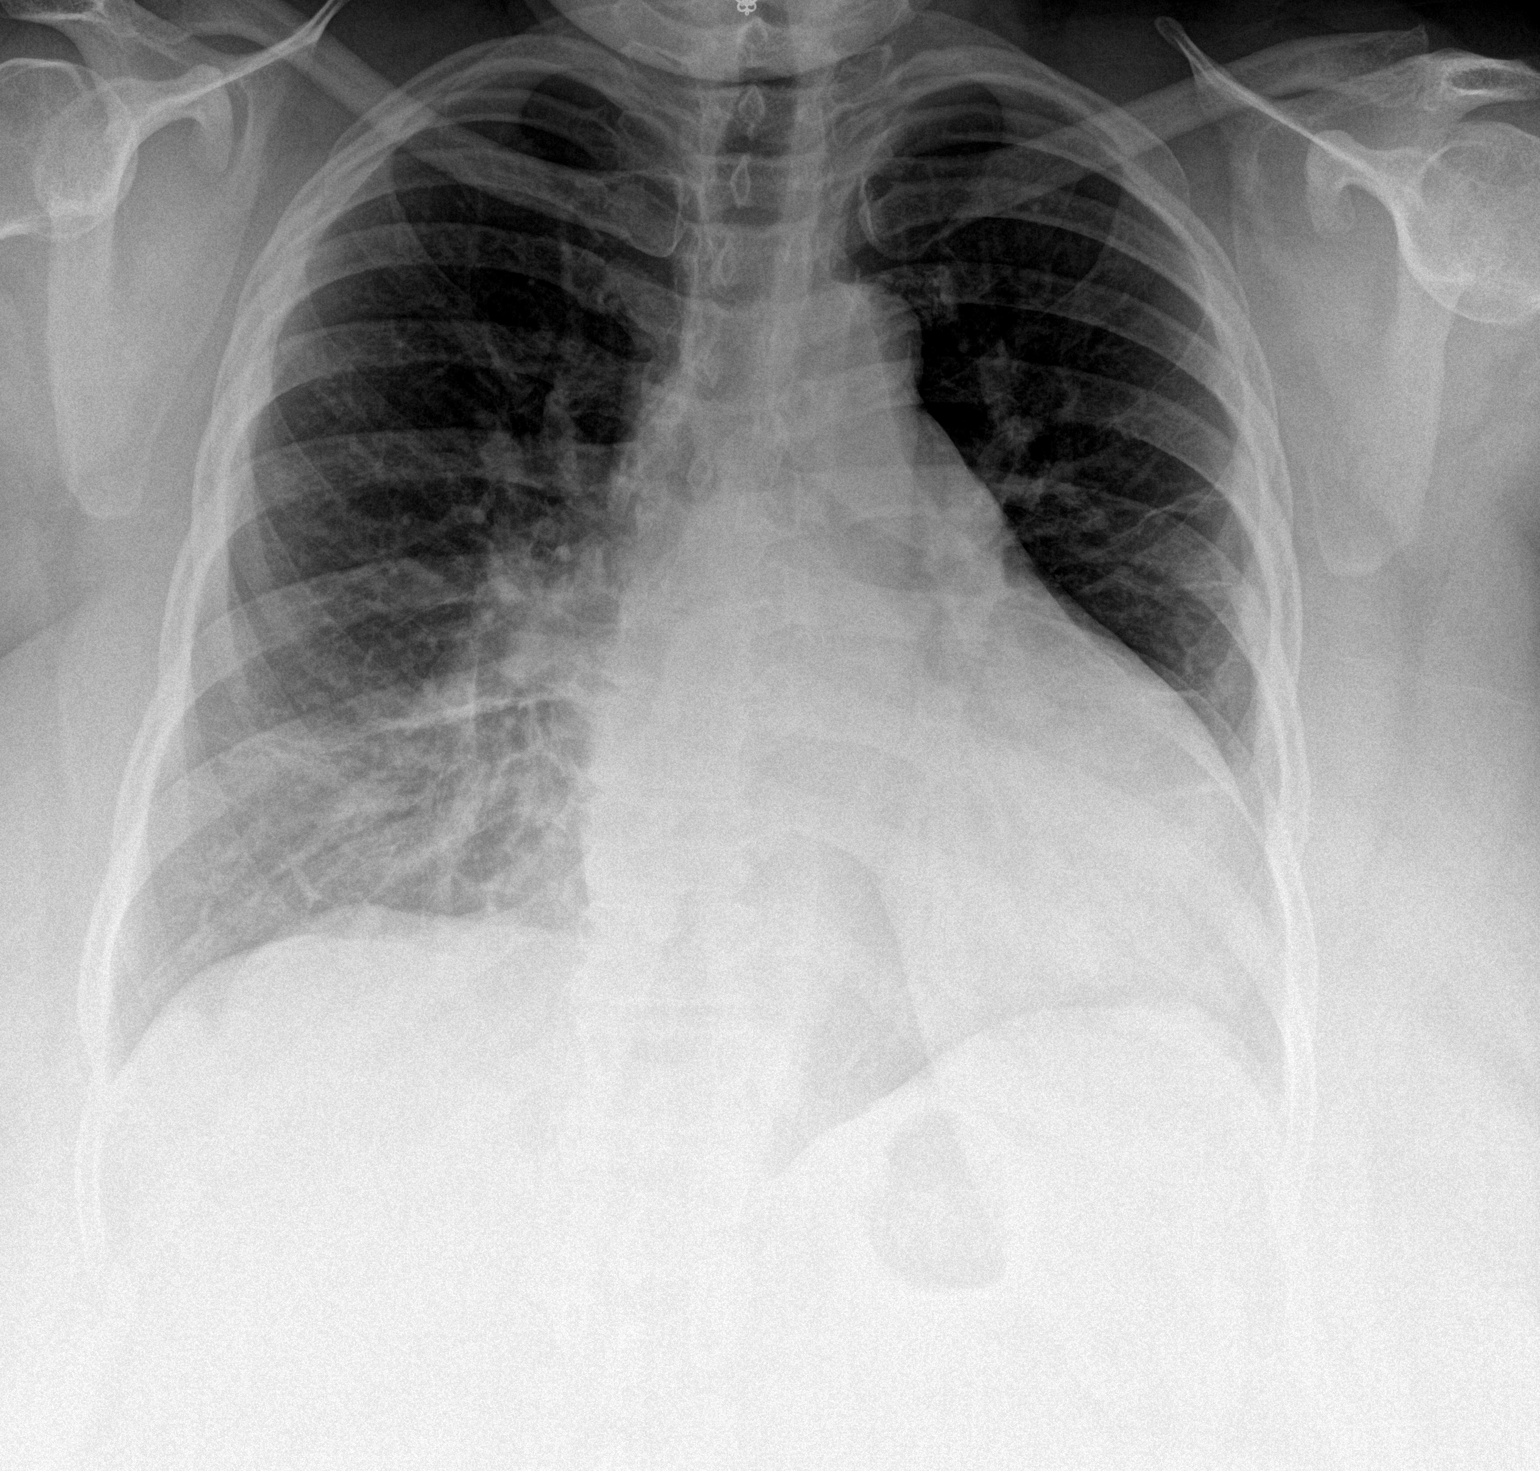
[im 2/2]
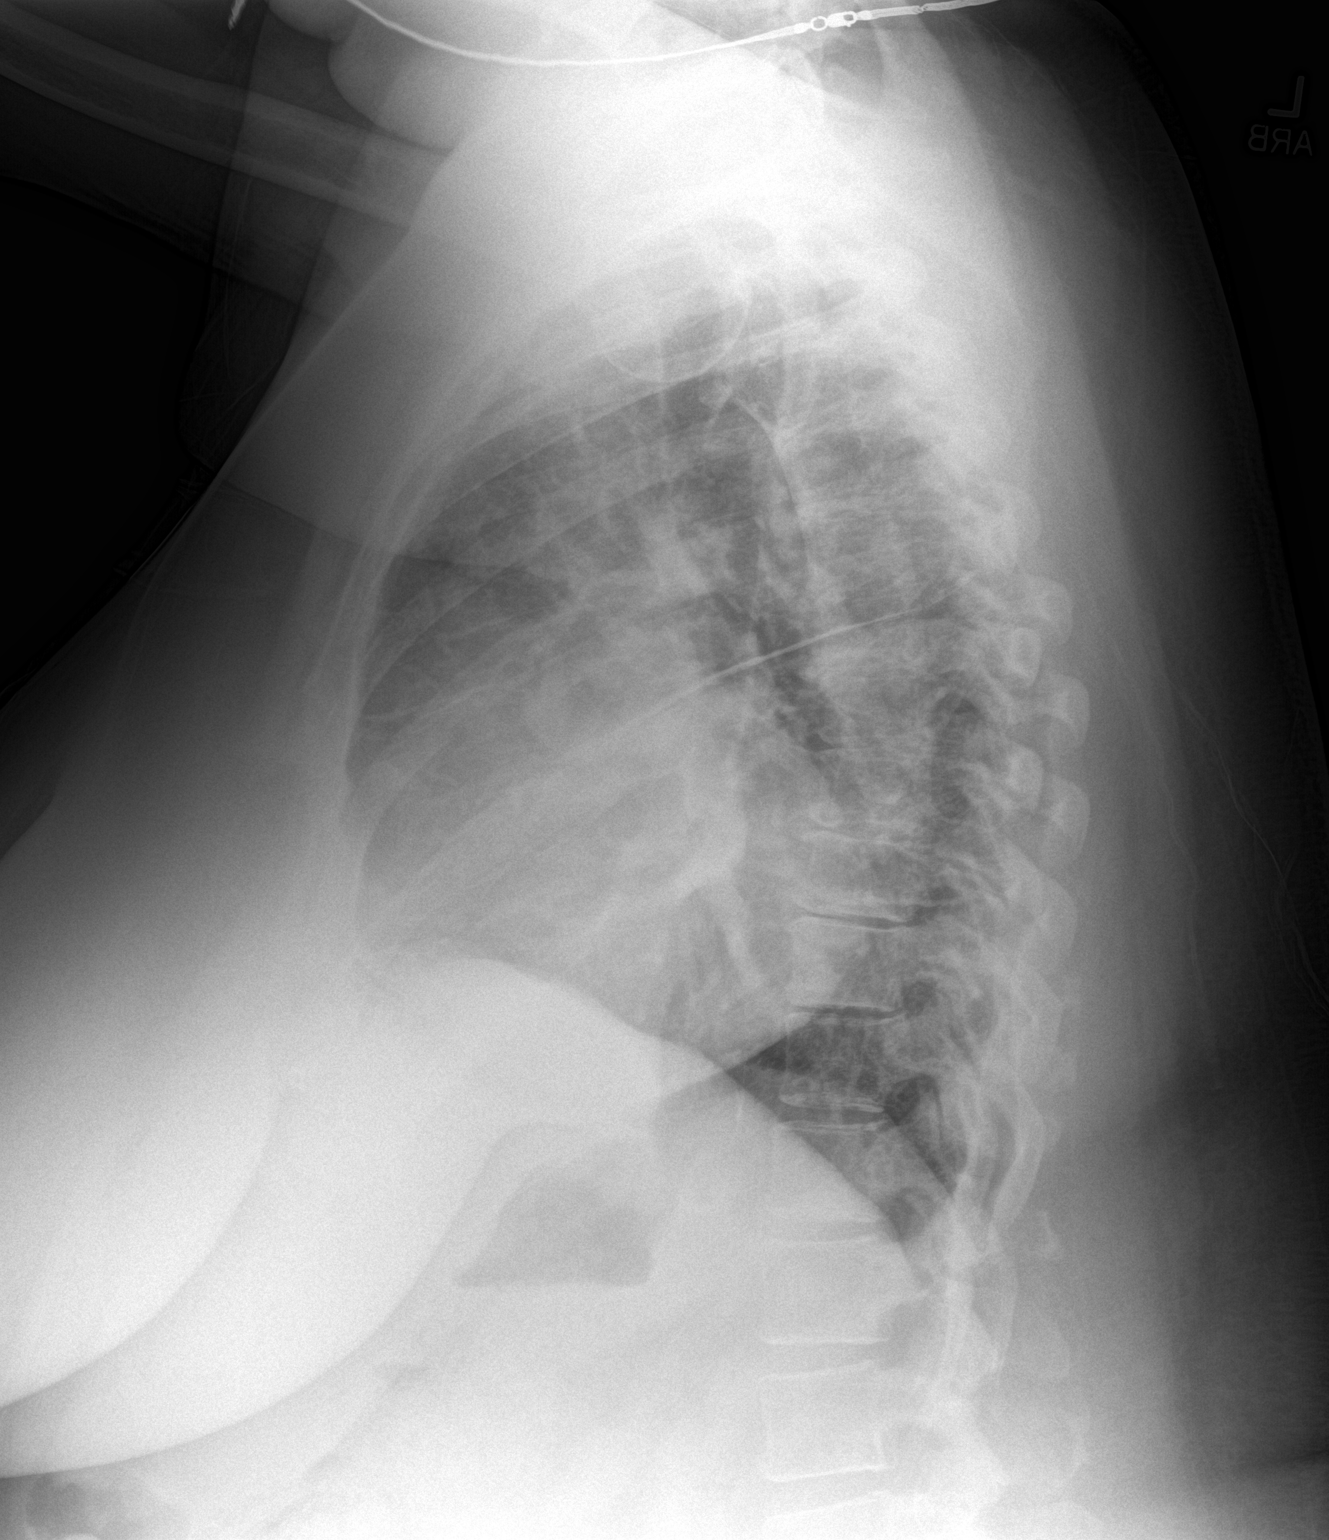

[2 of 2 positions shown; findings below may reference images not displayed]

FINDINGS: There is mild scarring and both mid and lower lung zones. No edema
or consolidation. Heart is mildly enlarged with pulmonary
vascularity within normal limits. No adenopathy. No bone lesions.
IMPRESSION: Areas of mild scarring bilaterally. No edema or consolidation.
Cardiac enlargement, stable.

## 2014-05-08 ENCOUNTER — Ambulatory Visit: Payer: Self-pay | Admitting: Nurse Practitioner

## 2014-05-08 IMAGING — MG MM DIGITAL SCREENING BILAT W/ CAD
6 series · 6 of 6 positions shown · non-contrast
Comparison: Previous exam(s).

CLINICAL DATA: Screening.

EXAM:
DIGITAL SCREENING BILATERAL MAMMOGRAM WITH CAD

[R MLO (1 of 2)]
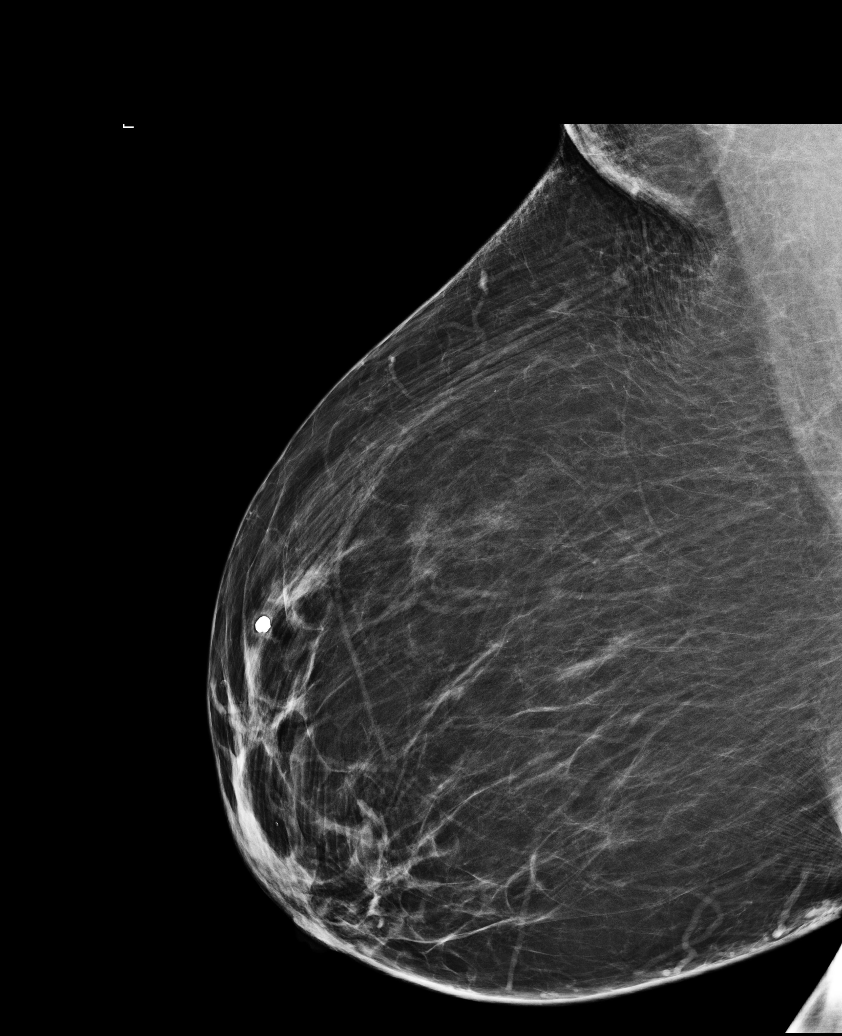

[L MLO (1 of 2)]
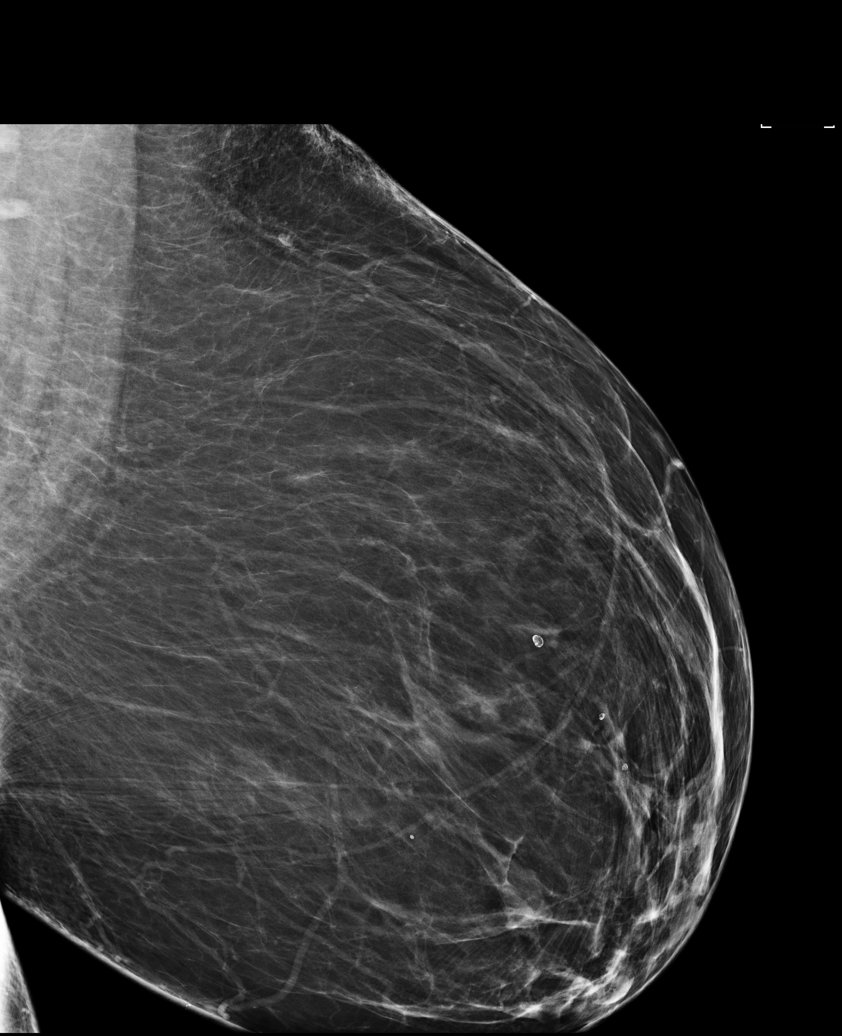

[L MLO (2 of 2)]
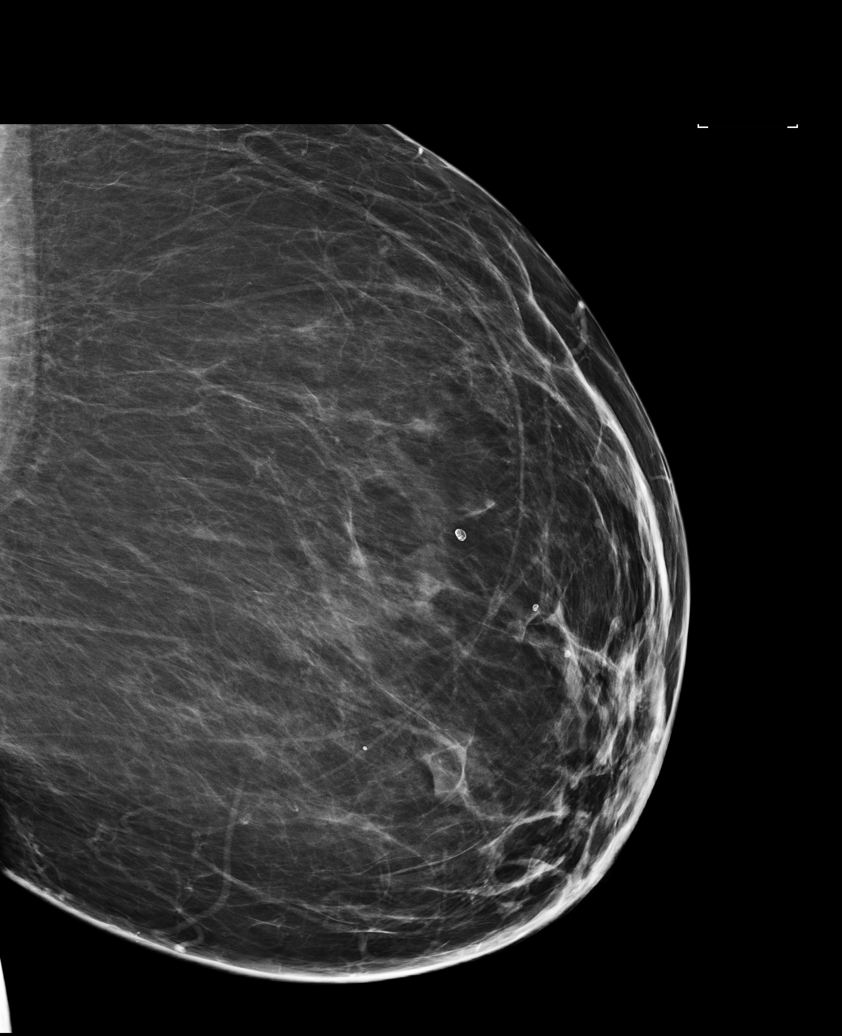

[R CC]
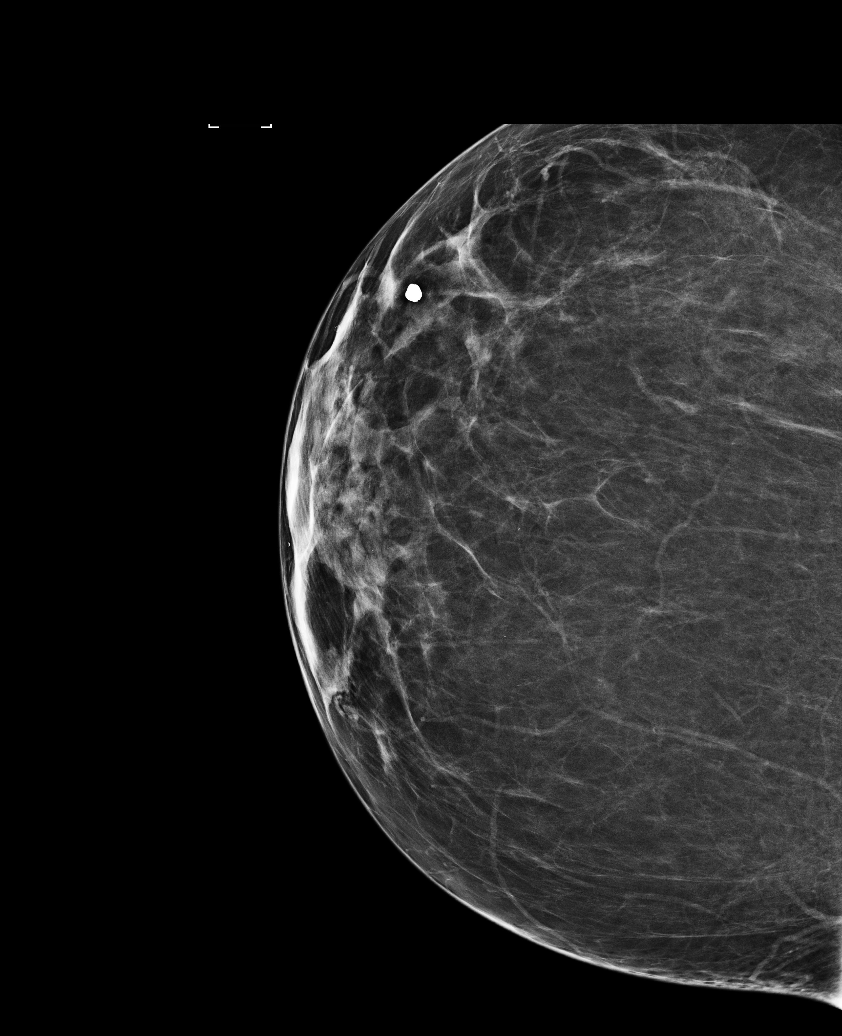

[L CC]
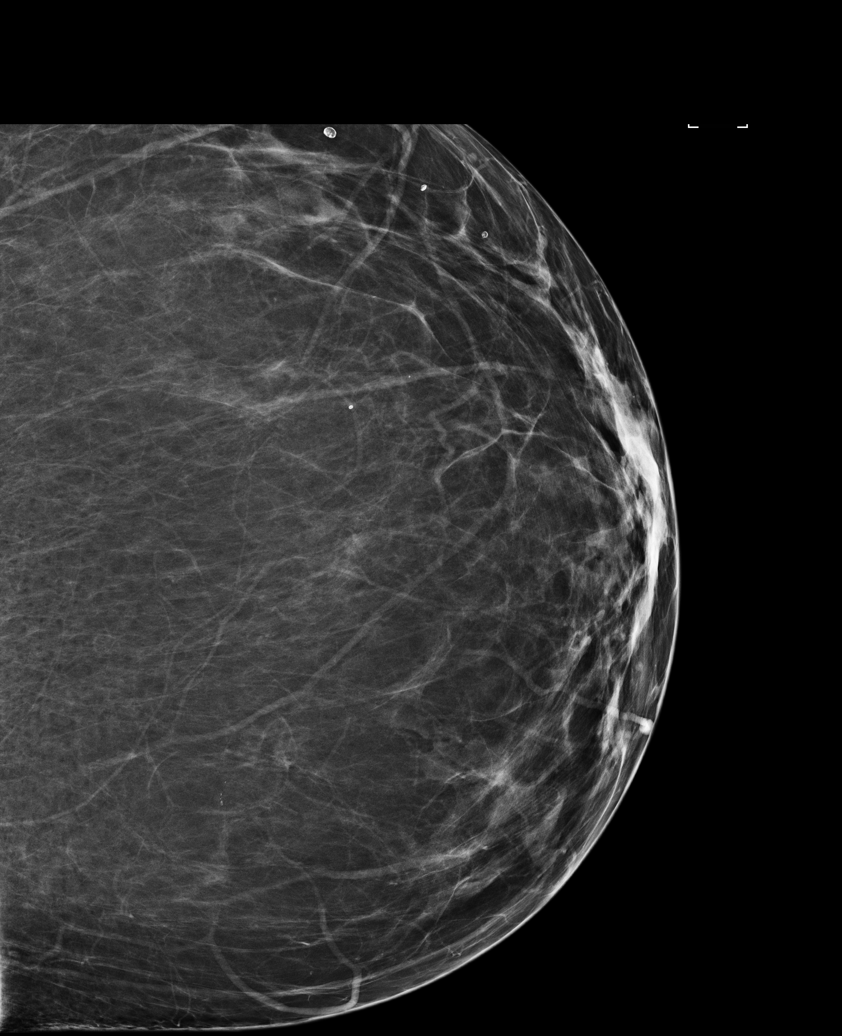

[R MLO (2 of 2)]
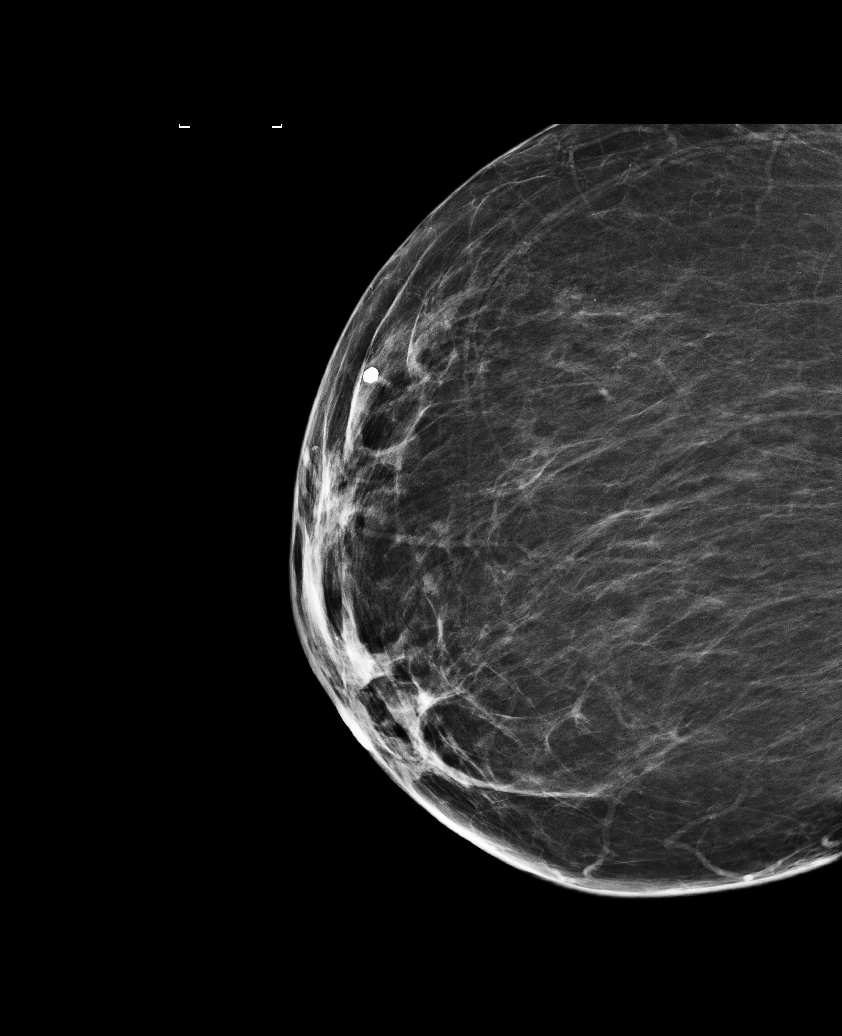

[6 of 6 positions shown; findings below may reference images not displayed]

ACR Breast Density Category b: There are scattered areas of
fibroglandular density.
FINDINGS: There are no findings suspicious for malignancy. Images were
processed with CAD.
IMPRESSION: No mammographic evidence of malignancy. A result letter of this
screening mammogram will be mailed directly to the patient.

RECOMMENDATION:
Screening mammogram in one year. (Code:[US])

BI-RADS CATEGORY  1: Negative.

## 2014-06-23 ENCOUNTER — Other Ambulatory Visit: Payer: Medicare Other

## 2014-06-24 ENCOUNTER — Ambulatory Visit (INDEPENDENT_AMBULATORY_CARE_PROVIDER_SITE_OTHER): Payer: Medicare Other | Admitting: Podiatry

## 2014-06-24 DIAGNOSIS — M79676 Pain in unspecified toe(s): Secondary | ICD-10-CM

## 2014-06-24 DIAGNOSIS — B351 Tinea unguium: Secondary | ICD-10-CM

## 2014-06-24 DIAGNOSIS — M722 Plantar fascial fibromatosis: Secondary | ICD-10-CM | POA: Diagnosis not present

## 2014-06-24 MED ORDER — TRIAMCINOLONE ACETONIDE 10 MG/ML IJ SUSP: 10.0000 mg | Freq: Once | INTRAMUSCULAR | Status: AC

## 2014-06-24 NOTE — Progress Notes (Signed)
Patient ID: Teresa HarvestValerie A [name], female   DOB: 1960/07/23, 54 y.o.   MRN: [id number]  Subjective: 54 y.o.-year-old female returns the office today for painful, elongated, thickened toenails which she is unable to trim herself. Denies any redness or drainage around the nails. She also states that she has should have recurrence of bilateral heel pain. She states that she periodically gets injections with Dr. Al CorpusHyatt which seems to help alleviate the symptoms. She also does state that she has neuropathy and she also has lumbar injury. She has. She is seen pain management she has been placed on gabapentin although she states it is not helping much and she has not been taking the medication. She denies any recent injury or trauma to bilateral lower extremities and she denies any swelling or redness overlying the heels or other areas. She states the pain does not wake her up at night. Denies any acute changes since last appointment and no new complaints today. Denies any systemic complaints such as fevers, chills, nausea, vomiting.   Objective: AAO 3, NAD DP/PT pulses palpable, CRT less than 3 seconds Protective sensation intact with Simms Weinstein monofilament, Achilles tendon reflex intact.  Nails hypertrophic, dystrophic, elongated, brittle, discolored 10. There is tenderness overlying the nails 1-5 bilaterally. There is no surrounding erythema or drainage along the nail sites. No open lesions or pre-ulcerative lesions are identified. There is tenderness to palpation upon the plantar aspect of the calcaneus bilaterally the insertion of the plantar fascia. There is no pain on the course the plantar fascia and the plantar fascia appears to be intact. There is no pain along the posterior aspect of the calcaneus or along the course/insertion of the Achilles tendon. There is no pain with lateral compression of the calcaneus. No other areas of tenderness to bilateral lower extremity is. No overlying edema, erythema,  increase in warmth to bilateral lower extremities. MMT 5/5, ROM WNL No pain with calf compression, swelling, warmth, erythema.  Assessment: Patient presents with symptomatic onychomycosis; bilateral heel pain  Plan: -Treatment options including alternatives, risks, complications were discussed -Nails sharply debrided 10 without complication/bleeding. -Discussed daily foot inspection. If there are any changes, to call the office immediately.  -Patient elects to proceed with steroid injection into bilateral heels. After I discussed all risks, complications of repeated injections, she verbally consents to them. Under sterile skin preparation, a total of 2.5cc of kenalog 10, 0.5% Marcaine plain, and 2% lidocaine plain were infiltrated into the symptomatic area without complication. A band-aid was applied. Patient tolerated the injection well without complication. Post-injection care with discussed with the patient. Discussed with the patient to ice the area over the next couple of days to help prevent a steroid flare.  -Discussed the patient that although she does have some symptoms of plantar fasciitis the majority of her pain is likely due to neuropathy and neurological in origin. I recommended the patient to follow-up with her pain management specialist in regards to gabapentin and other medications that she is taking. Also recommended further evaluation of the back. I discussed that long-term is not realistic to continue with steroid injections every 3 months. Continue with stretching activities. Also discussed shoegear modifications/orthotics.  -Follow-up in 3 months or sooner if any problems are to arise. In the meantime, encouraged to call the office with any questions, concerns, changes symptoms.

## 2014-06-25 ENCOUNTER — Emergency Department: Payer: Self-pay | Admitting: Internal Medicine

## 2014-07-10 LAB — COMPREHENSIVE METABOLIC PANEL
ANION GAP: 9 (ref 7–16)
Albumin: 3.9 g/dL
Alkaline Phosphatase: 81 U/L
BILIRUBIN TOTAL: 0.4 mg/dL
BUN: 19 mg/dL
CO2: 26 mmol/L
Calcium, Total: 8.6 mg/dL — ABNORMAL LOW
Chloride: 99 mmol/L — ABNORMAL LOW
Creatinine: 1.01 mg/dL — ABNORMAL HIGH
EGFR (Non-African Amer.): >60
Glucose: 134 mg/dL — ABNORMAL HIGH
POTASSIUM: 3.7 mmol/L
SGOT(AST): 22 U/L
SGPT (ALT): 16 U/L
SODIUM: 134 mmol/L — AB
Total Protein: 7.5 g/dL

## 2014-07-10 LAB — URINALYSIS, COMPLETE
BACTERIA: NONE SEEN
BILIRUBIN, UR: NEGATIVE
BLOOD: NEGATIVE
Glucose,UR: NEGATIVE mg/dL (ref 0–75)
Ketone: NEGATIVE
NITRITE: NEGATIVE
Ph: 6 (ref 4.5–8.0)
Protein: NEGATIVE
RBC,UR: 1 /HPF (ref 0–5)
SPECIFIC GRAVITY: 1.018 (ref 1.003–1.030)
WBC UR: 2 /HPF (ref 0–5)

## 2014-07-10 LAB — CBC WITH DIFFERENTIAL/PLATELET
BASOS PCT: 0.6 %
Basophil #: 0.1 10*3/uL (ref 0.0–0.1)
EOS ABS: 0.2 10*3/uL (ref 0.0–0.7)
Eosinophil %: 1.9 %
HCT: 35.1 % (ref 35.0–47.0)
HGB: 11.3 g/dL — AB (ref 12.0–16.0)
Lymphocyte #: 2.7 10*3/uL (ref 1.0–3.6)
Lymphocyte %: 27.5 %
MCH: 28.9 pg (ref 26.0–34.0)
MCHC: 32.2 g/dL (ref 32.0–36.0)
MCV: 90 fL (ref 80–100)
MONO ABS: 0.6 x10 3/mm (ref 0.2–0.9)
Monocyte %: 5.8 %
Neutrophil #: 6.3 10*3/uL (ref 1.4–6.5)
Neutrophil %: 64.2 %
Platelet: 338 10*3/uL (ref 150–440)
RBC: 3.91 10*6/uL (ref 3.80–5.20)
RDW: 14.4 % (ref 11.5–14.5)
WBC: 9.7 10*3/uL (ref 3.6–11.0)

## 2014-07-10 LAB — LIPASE, BLOOD: Lipase: 63 U/L — ABNORMAL HIGH

## 2014-07-10 IMAGING — CT CT ABD-PELV W/ CM
2 of 5 series · 17 of 46 positions shown, 19 images · IV contrast (omnipaque)
Comparison: Most recent CT [DATE]

CLINICAL DATA: Abdominal pain for 2 weeks with nausea. History of
pancreatitis.

EXAM:
CT ABDOMEN AND PELVIS WITH CONTRAST
TECHNIQUE: Multidetector CT imaging of the abdomen and pelvis was performed
using the standard protocol following bolus administration of
intravenous contrast.
CONTRAST:  100 mL Omnipaque 300 IV.

[Series 2: routine abd pel with · axial · 0.94mm/px · z∈[-469,-69]mm · 14 of 91 slices shown, 16 images]
[im 6/91  soft-tissue]
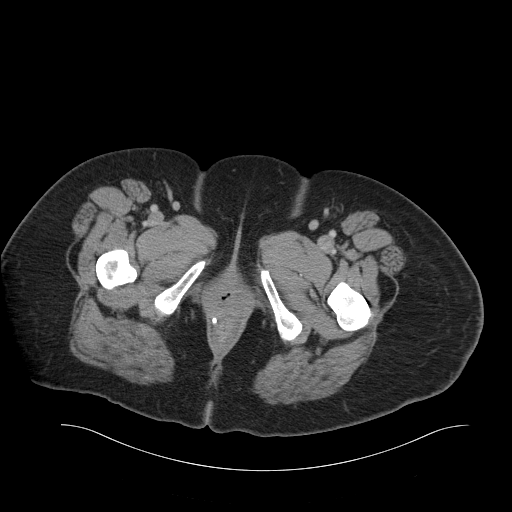
[im 6/91  bone]
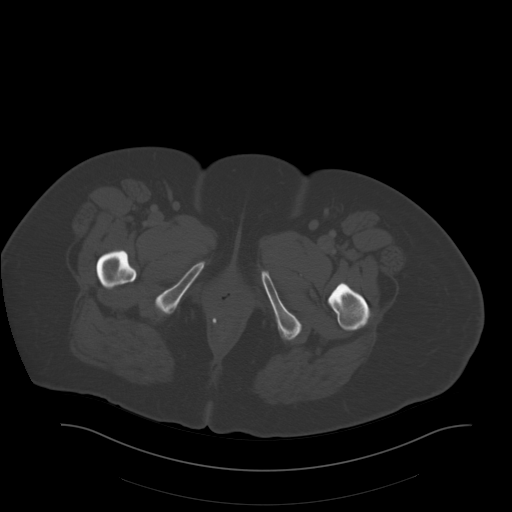
[im 11/91  soft-tissue]
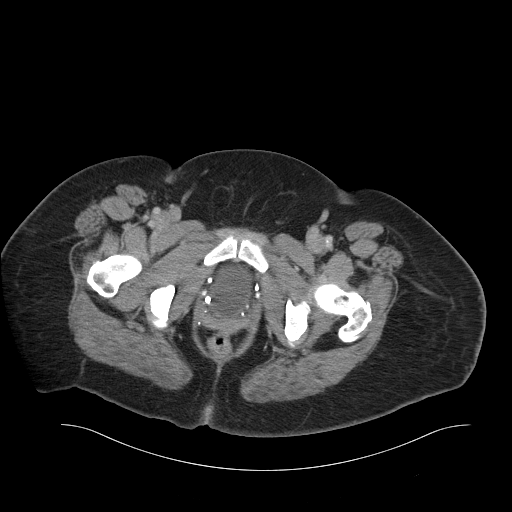
[im 21/91  soft-tissue]
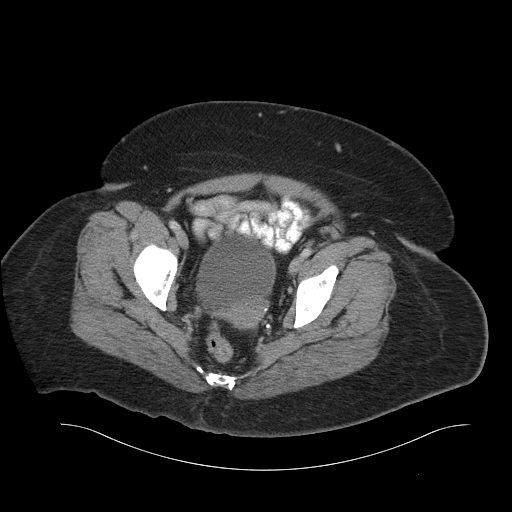
[im 26/91  soft-tissue]
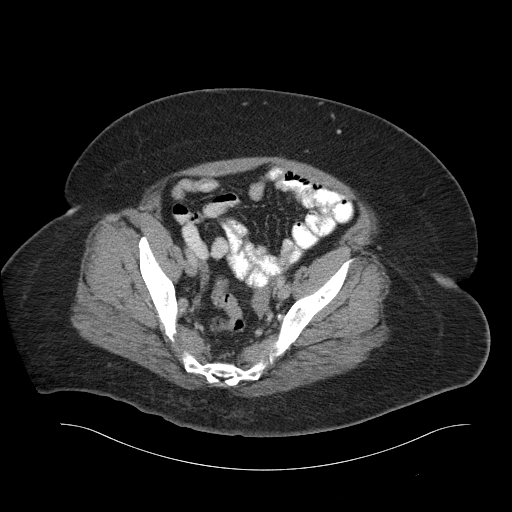
[im 31/91  soft-tissue]
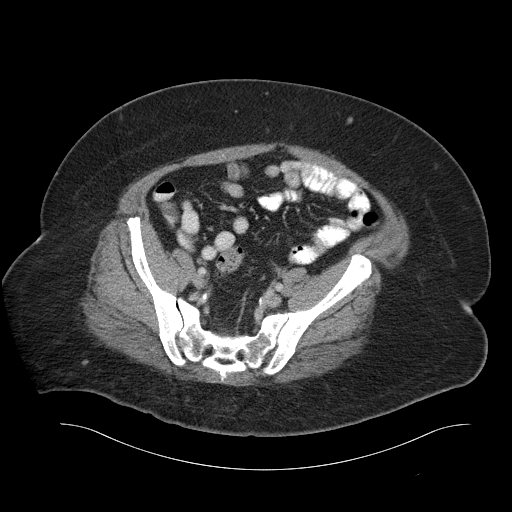
[im 36/91  soft-tissue]
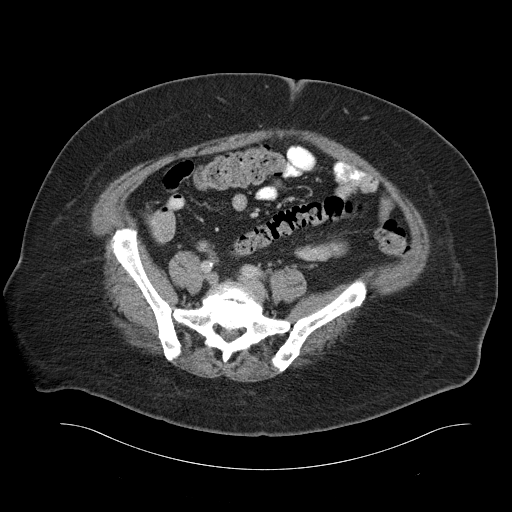
[im 41/91  soft-tissue]
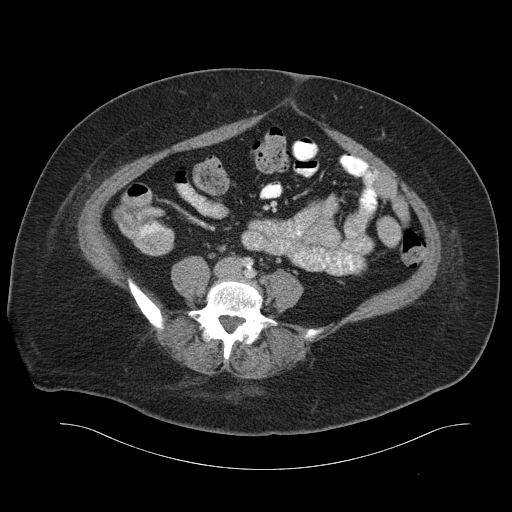
[im 51/91  soft-tissue]
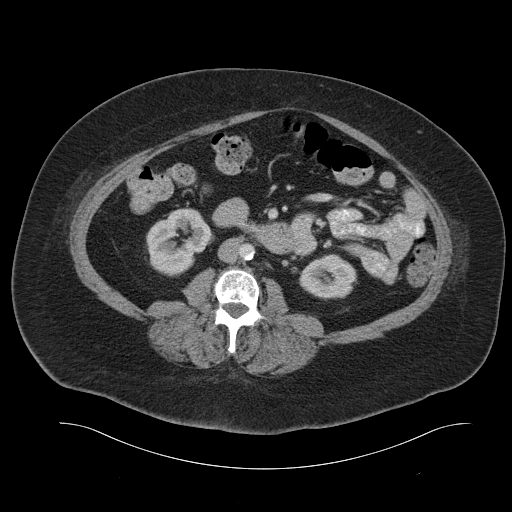
[im 56/91  soft-tissue]
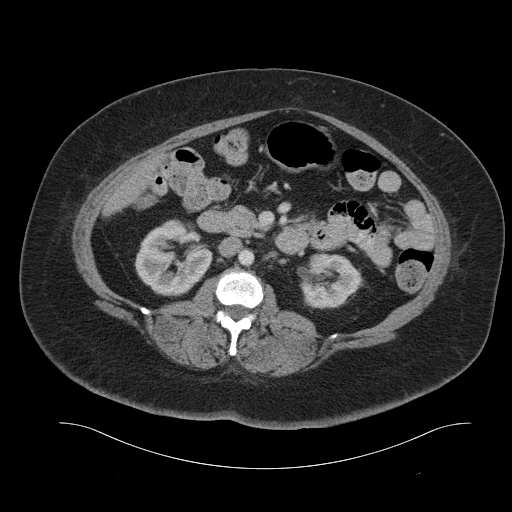
[im 56/91  bone]
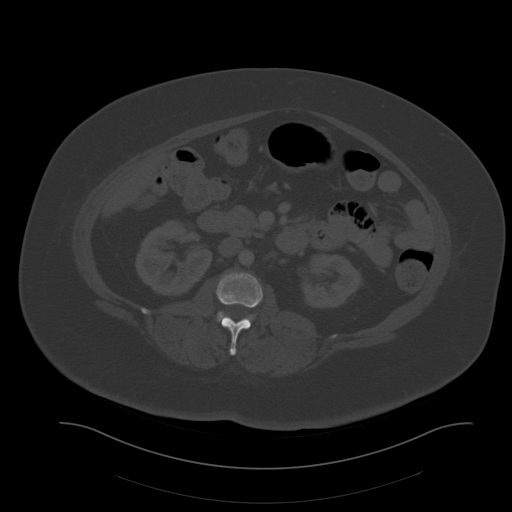
[im 61/91  soft-tissue]
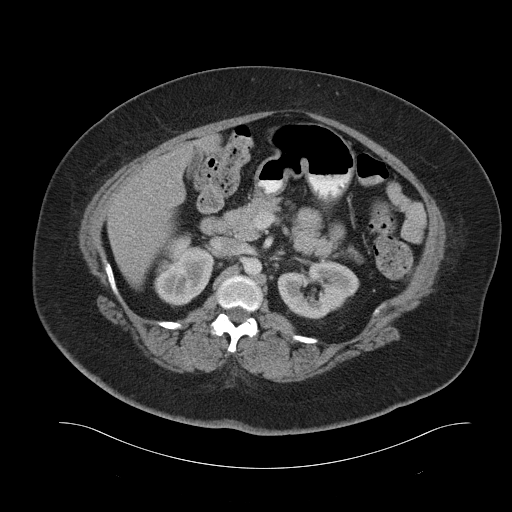
[im 66/91  soft-tissue]
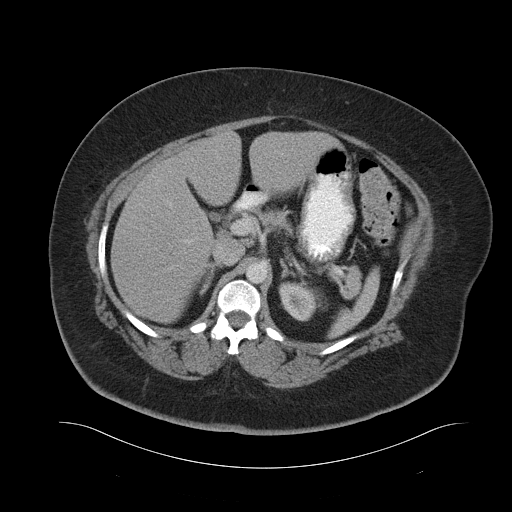
[im 71/91  soft-tissue]
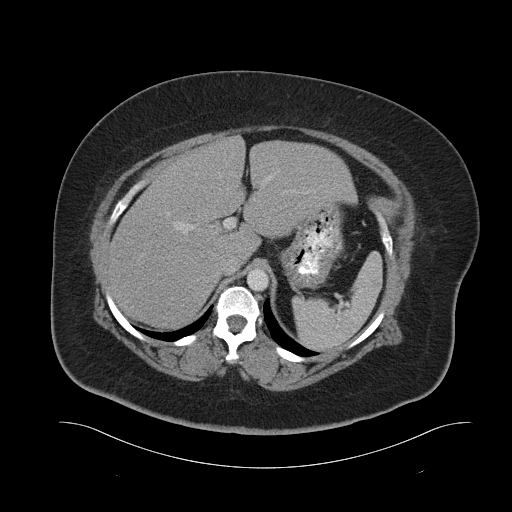
[im 81/91  soft-tissue]
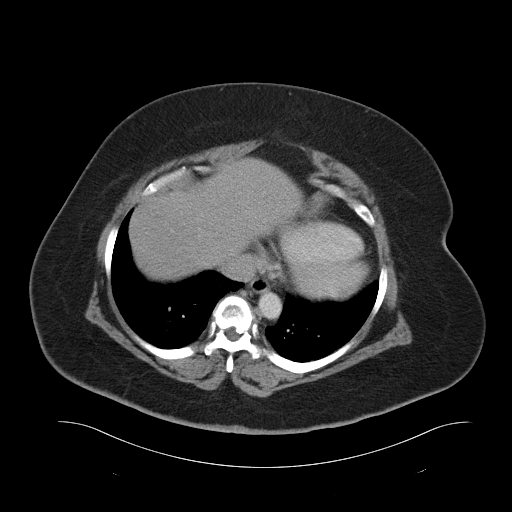
[im 86/91  soft-tissue]
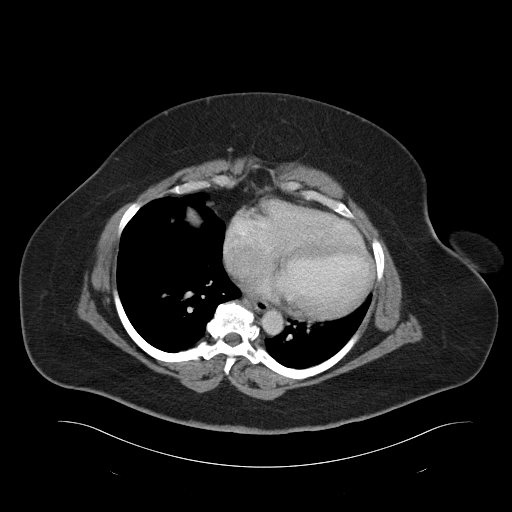

[Series 6: cor routine abd pel with · coronal · 0.93mm/px · 3 of 148 slices shown]
[im 50/148  soft-tissue]
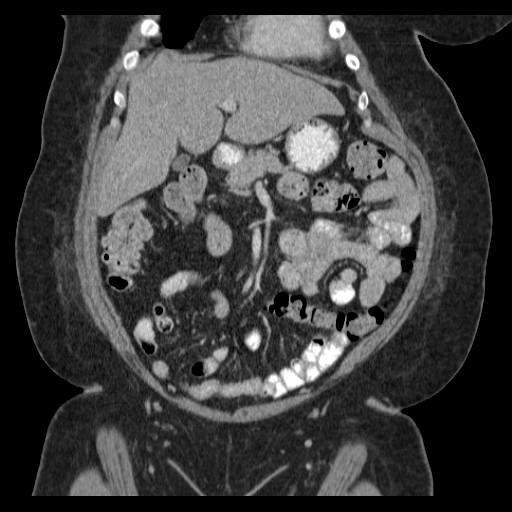
[im 66/148  soft-tissue]
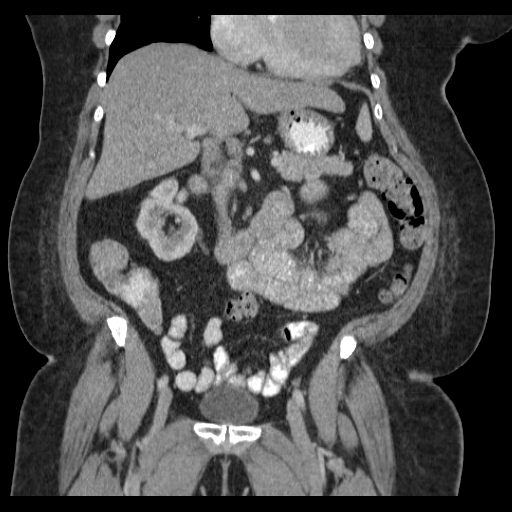
[im 82/148  soft-tissue]
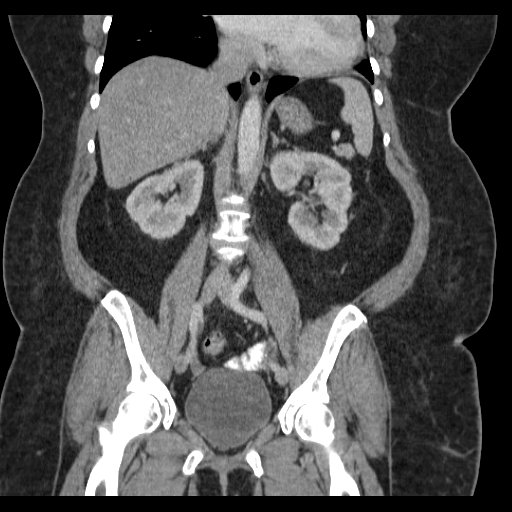

[17 of 46 positions shown; findings below may reference images not displayed]

FINDINGS: Atelectasis or scarring at the lung bases, similar to prior. Mild
cardiomegaly.

Diffusely decreased hepatic density consistent with steatosis. No
focal hepatic lesion. Gallbladder is decompressed. The spleen,
adrenal glands, and pancreas are normal. There is no peripancreatic
inflammatory change. No pancreatic ductal dilatation. The kidneys
demonstrate symmetric enhancement and excretion. No hydronephrosis
or perinephric stranding.

The stomach is physiologically distended. There are no dilated or
thickened small bowel loops. The appendix is normal. Moderate stool
throughout the colon without colonic wall thickening or inflammatory
change. No free air, free fluid, or intra-abdominal fluid
collection.

Abdominal aorta is normal in caliber mild atherosclerosis and
tortuosity. No retroperitoneal adenopathy. Small fat containing
umbilical hernia.

Within the pelvis the bladder is physiologically distended the
uterus and adnexa are normal for age. There is no pelvic free fluid.
No pelvic adenopathy. No inguinal hernia. There are multiple pelvic
phleboliths.

There are no acute or suspicious osseous abnormalities. Degenerative
change throughout the spine, most significant in the lower lumbar
spine.
IMPRESSION: 1. No acute abnormality in the abdomen/pelvis. Particularly, no
findings of pancreatic inflammation.
2. Mild hepatic steatosis.  Tiny fat containing umbilical hernia.

## 2014-07-11 ENCOUNTER — Inpatient Hospital Stay
Admit: 2014-07-11 | Disposition: A | Discharge status: Home / Self Care | Payer: Self-pay | Attending: Internal Medicine | Admitting: Internal Medicine

## 2014-07-29 NOTE — H&P (Signed)
PATIENT NAME:  Teresa [name], SHANDS MR#:  [id number] DATE OF BIRTH:  04/18/1960  DATE OF ADMISSION:  12/09/2011  PRIMARY CARE PHYSICIAN: Dr. Bethena Roys - [facility]   Case discussed with ER physician, old records reviewed.  Imaging studies and EKG reviewed personally along with reviewing the telemetry. History obtained from the patient, nursing staff, and old records.   CHIEF COMPLAINT: Palpitations, left-sided chest pain, and shortness of breath.   HISTORY OF PRESENT ILLNESS: This is a 54 year old African American female patient with history of VSD status post repair in 2007, diabetes mellitus, hypertension, anxiety, depression, and tobacco abuse who presented to the Emergency Room complaining of palpitations, shortness of breath, and left-sided chest pain. The patient was initially verbal and gave a history to the nursing staff, but on initial examination by the ER physician has been somnolent after which she had an ABG done which showed elevated pCO2 of 61, was placed on BiPAP and seems to be slowly responding, but still somnolent. No significant history can be obtained from the patient other than the fact that she mentions palpitations and is drowsy. She does mention that she has had on and off palpitations and chest pain in the past. Limited history at this time.   The patient was last admitted to the hospitalist service in early 2013 for chest pain, had a stress test done along with a 2-D echocardiogram done and was discharged home in stable condition with diagnosis of atypical chest pain likely secondary to anxiety.   Today in the Emergency Room, the patient's CT scan of the head shows no acute abnormalities. Chest x-ray shows bilateral basal atelectasis with no congestive heart failure or pneumonia, on personal, radiology report pending. Initial EKG showed accelerated junctional rhythm and presently this is normal sinus rhythm after BiPAP was placed on the patient.   PAST MEDICAL HISTORY:   1. Insulin-dependent diabetes mellitus.  2. Hypertension. 3. Hyperlipidemia. 4. Anxiety and depression. 5. Tobacco abuse. 6. History of VSD status post repair in 2007.   ALLERGIES: Amoxicillin, penicillin, sulfa, and shellfish.   CURRENT MEDICATIONS:  1. Aspirin 81 mg oral daily.  2. Enalapril 5 mg daily.  3. Famotidine 20 mg oral twice a day. 4. Fluoxetine 60 mg daily.  5. Hydrochlorothiazide 52 mg  daily.  6. Levemir 35 units subcutaneous daily.  7. Metformin 1000 mg twice a day. 8. Simvastatin 40 mg daily.  9. Xanax 1 mg three times daily as needed.  10. Metoprolol 25 mg twice a day.  SOCIAL HISTORY: The patient smokes a pack a day. No alcohol. No illicit drugs.   CODE STATUS: FULL CODE.   FAMILY HISTORY: Significant for hypertension, diabetes, and coronary artery disease.   REVIEW OF SYSTEMS: A 10 point system review attempted, but unobtainable as the patient is not answering to any questions at this time.   PHYSICAL EXAMINATION:   VITAL SIGNS: Temperature 98.3, pulse 127 and down to 191, blood pressure 112/75, and saturating 95% on BiPAP.   GENERAL: Obese, African American female patient lying in bed somnolent on BiPAP.   PSYCHIATRIC: Drowsy, not oriented to person, place, and time. Difficult to judge for any mood and affect.   HEENT: Atraumatic, normocephalic. Oral mucosa moist and pink. External ears and nose normal.  No pallor. No icterus. Pupils bilaterally equal and reactive to light.   NECK: Supple. No thyromegaly. No palpable lymph nodes. No JVD.   CARDIOVASCULAR: S1 and S2, regular rate and rhythm. Peripheral pulses 2+. No edema.  RESPIRATORY: BiPAP in place, clear to auscultation on both sides, decreased air entry at the bases. No crackles. No use of accessory muscles.   GASTROINTESTINAL: Soft abdomen, nontender. Bowel sounds present. No hepatosplenomegaly palpable. No masses.   SKIN: Warm and dry. No petechiae, rash, or ulcers.   EXTREMITIES: No  edema.   MUSCULOSKELETAL: No joint swelling, redness, or effusion of the large joints. Normal muscle tone.   NEUROLOGICAL: Cranial nerves II through XII intact. The patient is drowsy, moves all four extremities symmetrically on both sides.   LABORATORY, DIAGNOSTIC AND RADIOLOGIC DATA: Lab studies show glucose of 162. BNP 128. BUN 12, creatinine 0.82, sodium 138, and potassium 3.5.  CK 75. Troponin less than 0.02. TSH 1.73. WBC 10.1, hemoglobin 11.7, and platelets 317.   ABG shows a pH of 7.29 with pCO2 61 and pO2 95 on room air.   CT scan of the head shows no acute abnormalities.   Chest x-ray shows bilateral basal atelectasis.   EKG shows junctional rhythm at 127. No acute ST-T wave changes.   ASSESSMENT AND PLAN:  1. Acute encephalopathy likely secondary from acute hypercapnic respiratory failure. We will continue the BiPAP at this time. Unclear etiology but the patient is on benzodiazepines at home. We will need to check a urine drug screen as this could be secondary to overdose from medications. We will also check a liver function test, ammonia, and vitamin B12. CT scan of the head shows no acute abnormalities. We will have to monitor for response with the BiPAP being placed on the patient.  2. Acute hypercapnic respiratory failure. Please see above.  3. Palpitations and chest pain. Cardiac enzyme is normal. The patient does have VSD and status post repair has had trouble with palpitations in the past. Presently has junctional rhythm with no significant acute abnormalities. She had a recent a 2-D echocardiogram five months back. We will consult cardiology for further help. Presently the patient is in normal sinus rhythm and doing well. Her chest pain is likely secondary from the tachycardia. She had a normal stress test earlier this year. We will await further input from cardiology. We will also check a d-dimer. If this is positive, this will be followed by a V/Q scan.  4. Insulin-dependent  diabetes mellitus. The patient's glucose is in the normal range. We will decrease her Levemir to 20 units as the patient is drowsy and is unable to take any oral food and will be on a sliding scale insulin.  5. Hypertension, well controlled. Continue medications.  6. CODE STATUS: FULL CODE.   TIME SPENT: Time spent today on this case with acute encephalopathy with hypercapnic respiratory failure needing BiPAP support was 50 minutes of critical care time.  ____________________________ Molinda BailiffSrikar R. Shonta Bourque, MD srs:slb D: 12/09/2011 04:00:08 ET T: 12/09/2011 07:06:02 ET JOB#: [id number]  cc: Wardell HeathSrikar R. Breely Panik, MD, <Dictator> PCP - Dr. Bethena RoysFish [facility] Orie FishermanSRIKAR R Lenya Sterne MD ELECTRONICALLY SIGNED 12/09/2011 7:38

## 2014-07-29 NOTE — Discharge Summary (Signed)
PATIENT NAME:  Teresa [name], Teresa [name] MR#:  [id number] DATE OF BIRTH:  October 04, 1960  DATE OF ADMISSION:  12/09/2011 DATE OF DISCHARGE:  12/10/2011  REASON FOR ADMISSION: Palpitations, left-sided chest pain and shortness of breath.   DISCHARGE DIAGNOSES: 1. Altered mental status.  2. Acute hypercapnic respiratory failure.  3. Palpitations, chest pain.  4. Junctional rhythm.  5. Diabetes insulin dependent, well controlled.  6. Hypertension.  7. Anxiety, depression.  8. Smoking.  9. Obstructive sleep apnea.   DISPOSITION: Home.   MEDICATIONS AT DISCHARGE:  1. Enalapril 5 mg once a day. 2. Famotidine 20 mg twice daily.  3. Fluoxetine 60 mg once daily. 4. Aspirin 81 mg once daily. 5. Metformin 1000 mg twice daily.  6. Simvastatin 40 mg once daily. 7. Alprazolam 1 mg 3 times daily.  8. Amlodipine 5 mg once daily. 9. Levemir 35 units subcutaneously once daily.   HOSPITAL COURSE: Ms. Teresa [name] is a very nice 54 year old female who has history of insulin-dependent diabetes, hypertension, hyperlipidemia, anxiety and depression, history of VSD status post repair in 2007. The patient came to the ER in [place] with a history of palpitations, shortness of breath and left-sided chest pain. The patient was initially awake but then became very somnolent and to the point of being lethargic. ABGs were done showing a increased CO2 of 61. The patient was on BiPAP and is starting to wake up slowly but still remains somnolent through the day. CT of the head was done showing no acute abnormalities. Chest x-ray show bilateral atelectasis but no signs of pneumonia or congestive heart failure.   The patient was kept for observation and she mostly woke up several hours. After obtaining more history from the patient she says that she did not have more chest pain or palpitations and that she occasionally has those but overnight she had trazodone and usually she becomes very somnolent when she takes that  medication. She does not take this medication regularly. She had it from a previous prescription from her psychiatrist after her brother passed. Patient is no longer due to take this medication.   As far as her status in the hospital she actually went back to baseline overnight and this morning she is feeling great and she wants to go home.  Her labs this morning are fine with just a glucose of 184, normal creatinine of 0.91 and a CO2 of 30. Her ammonia plasma was a little bit elevated yesterday at 42 for what she was given some lactulose.   Her cardiac enzymes were negative, troponins all along x3. Her UDS was positive for benzodiazepines, which he takes chronically.   There was no elevated white blood count and her hemoglobin is 11.5, which is chronic. She has normal anemia. Her urinalysis shows increased white blood cells at 161 but patient has been all along asymptomatic for what that was not treated.   During my interview with the patient she told me that she was diagnosed with sleep apnea, but she cannot tolerate CPAP. I had a long discussion with the patient about effects of sleep apnea and risk of sudden death. The patient understands the risks and she states that she will go back to her primary care physician and try to get another sleep study scheduled. At this time if it is necessary she will try to stick with her CPAP.   Patient is also a smoker and she is really not willing to quit smoking at this moment.   As far as  her other medical problems, her respiratory failure has resolved, her palpitations, chest pain. Patient has some junctional rhythm on presentation but she had a stress test in 2013. She has been seen by cardiologist in the past which is going to refer to go back to. At this moment there is really no other symptoms. They have resolved. Patient to follow with her pcp in the office. The echo can be repeated at the office.   TIME SPENT: I spent about 40 minutes with this  discharge.   ____________________________ Felipa Furnaceoberto Sanchez Gutierrez, MD rsg:cms D: 12/10/2011 09:29:09 ET T: 12/13/2011 10:41:48 ET JOB#: [id number]  cc: Felipa Furnaceoberto Sanchez Gutierrez, MD, <Dictator>  Taylour Lietzke Juanda ChanceSANCHEZ GUTIERRE MD ELECTRONICALLY SIGNED 12/16/2011 14:19

## 2014-07-29 NOTE — Consult Note (Signed)
PATIENT NAME:  Teresa [name], Teresa [name] MR#:  [id number] DATE OF BIRTH:  [birth year]  DATE OF CONSULTATION:  12/09/2011  REFERRING PHYSICIAN:  Dr. Elpidio AnisSudini CONSULTING PHYSICIAN:  Dr. Wynelle LinkShaukat Khan/ Verta EllenMonica [name]. Li Fragoso, PA-C  PRIMARY CARE PHYSICIAN:  [facility]Fisher, [facility]    REASON FOR CONSULTATION: Chest pain.   HISTORY OF PRESENT ILLNESS: Teresa [name] is [name] 54 year old African American female with [name] history of diabetes mellitus, hypertension, hyperlipidemia, and ventral septal defect status post repair in 2007. The patient presented with left-sided chest pain, palpitations, and acute shortness of breath. The patient has been very somnolent, is on BiPAP and is minimally responsive at this time. Her history is obtained basically from the chart. She does nod her head slightly saying she did have some mild chest pain. No other history can be obtained.   PAST MEDICAL HISTORY:  1. Diabetes mellitus, insulin-dependent.  2. Hypertension.  3. Hyperlipidemia.  4. Anxiety and depression.  5. Tobacco abuse.  6. Ventral septal defect, status post repair in 2007.   ALLERGIES: Amoxicillin, penicillin, sulfa, shellfish.   HOME MEDICATIONS:  1. Aspirin 81 mg p.o. daily.  2. Enalapril 5 mg p.o. daily.  3. Famotidine 20 mg p.o. twice daily.  4. Fluoxetine 60 mg daily.  5. Hydrochlorothiazide unknown dose daily.  6. Levemir 35 units subcutaneously daily.  7. Metformin 1000 mg p.o. twice daily.  8. Simvastatin 40 mg p.o. daily.  9. Xanax 1 mg 3 times daily as needed.  10. Metoprolol tartrate 25 mg b.i.d.   SOCIAL HISTORY: She smokes 1 pack per day of cigarettes. No alcohol or illicit drug use.   FAMILY HISTORY: Positive for hypertension, diabetes, and coronary artery disease.   REVIEW OF SYSTEMS: Unable to be obtained.   PHYSICAL EXAMINATION:  GENERAL: This is [name] very somnolent [place] who is on BiPAP. She is able to be only minimally aroused, is not alert.   VITAL SIGNS: Temperature  97.1 degrees Fahrenheit, heart rate 82, respiratory rate 20, blood pressure 134/82, oxygen saturation 97% at rest.   HEENT: Head atraumatic, normocephalic. Eyes- pupils are round. Ears, nose, and mouth are normal to external inspection. The patient has BiPAP.   NECK: Supple. Trachea is midline. There is no carotid bruit.   LUNGS: Course breath sounds with BiPAP in place. No use of accessory muscles.   CARDIOVASCULAR: Regular rate and rhythm noted. No murmurs can be heard over the BiPAP machine.   ABDOMEN: Nondistended. Bowel sounds are present. The patient has some mild epigastric tenderness on palpation. There is no rebound tenderness, guarding, peritoneal signs, or hepatosplenomegaly.   EXTREMITIES: No cyanosis, clubbing, or edema.   ANCILLARY DATA: EKG on admission with accelerated junctional rhythm, patient now in normal sinus at 86 beats per minute.    Chest x-ray from August 30: Cardiomegaly with right lung base increased density, which could represent atelectasis or infiltrate.   CT of the head: No evidence of acute intracranial abnormalities.   Glucose 181, BNP 128, BUN 12, creatinine 0.82, sodium 138, potassium 3.5, chloride 103, estimated GFR is greater than 60. Total protein 6.8. Albumin is 3.2, total bilirubin 0.3, alkaline phosphatase 80, AST 18, ALT 13, ethanol not detected, ammonia 42, troponin I is less than 0.02. TSH is 1.73. Urine drug screen is positive for benzodiazepine and MDMA. White blood cell count is 10, hemoglobin 11.7, hematocrit 36.4, platelet count 317,000.    ASSESSMENT/PLAN:  1. Acute encephalopathy likely from acute hypercapnic respiratory failure: The patient is  on BiPAP at this time. There still is unknown etiology of her symptoms. CT scan of the head with no acute abnormalities. The patient has slightly elevated ammonia.  2. Chest pain with abnormal EKG on admission: The patient had complained of some chest pain, but we are unable to obtain [name] review of  systems at this time.  EKG is currently in normal sinus rhythm (change from admission EKG of accelerated junctional). Troponin I has been negative. The patient had cardiac work-up in February 2013 with Dr. Mariah Milling and stress test was normal at that time with normal ejection fraction. We will cycle cardiac enzymes and continue to monitor the patient.  3. Ventral septal defect, status post repair. The patient's last echocardiogram done in February of this year showed no evidence of recurrent ventral septal defect, ejection fraction greater than 55%. She had some diastolic dysfunction, mild left ventricular hypertrophy, moderate anterior and septal wall hypokinesis which was most likely secondary to her postoperative state. We will reassess this again as mentioned above with [name] repeat echo.   Thank you very much for this consultation and allowing [place] participate in this patient's care. We will continue to follow this patient with you.  ____________________________ Verta Ellen, PA-C mam:bjt D: 12/09/2011 10:49:55 ET T: 12/09/2011 11:21:35 ET JOB#: [id number]  cc: Verta Ellen, PA-C, <Dictator> Dr. Sherrie Mustache, [facility] [name] [facility] PA ELECTRONICALLY SIGNED 12/09/2011 16:12

## 2014-07-29 NOTE — Consult Note (Signed)
Brief Consult Note: Diagnosis: CP and junctional rhythm on EKG.   Consult note dictated.   Comments: Patient with CP, SOB, respir distress on BiPAP. She had accelerated junctional rhythm on admission, now in NSR. Patient with neg TNI and no acute EKG changes to suggest ACS. She is s/o VSD repair and had nulcear stress testing and echo in 05/2011 with no evidence of cardiac ischemia and normal LVEF. Last echo with no evidence of VSD, normal LVEF. Cycle cardiac enzymes and will repeat echo. Will follow.  Electronic Signatures: Radene KneeKhan, Shaukat Ali (MD)   (Signed [phone]-Aug-13 10:57)  Co-Signer: Brief Consult Note Maia PlanManzi, Tashanti Dalporto A (PA-C)   (Signed 30-Aug-13 08:45)  Authored: Brief Consult Note  Last Updated: 30-Aug-13 10:57 by Radene KneeKhan, Shaukat Ali (MD)

## 2014-08-01 NOTE — Discharge Summary (Signed)
PATIENT NAME:  Teresa [name], Teresa [name] MR#:  [id number] DATE OF BIRTH:  10-Aug-1960  DATE OF ADMISSION:  04/17/2012 DATE OF DISCHARGE:    PRIMARY CARE PHYSICIAN: [facility].   CHIEF COMPLAINT: Shortness of breath.   DISCHARGE DIAGNOSES: 1.  Acute respiratory failure with significant hypoxia and hypercapnia likely secondary to chronic obstructive pulmonary disease exacerbation and pneumonia.  2.  Diabetes.  3.  Uncontrolled hypertension.  4.  Respiratory acidosis, resolved.  5.  Tobacco abuse.   DISCHARGE MEDICATIONS:   Famotidine 20 mg 2 times [name] day. Fluoxetine 60 mg daily. Aspirin 81 mg daily. Metformin 1000 mg 2 times [name] day. Simvastatin 40 mg daily. Alprazolam 1 mg 3 times [name] day.  Levemir FlexPen 67 units once [name] day at bedtime. Enalapril 20 mg daily. Amlodipine 10 mg daily. Prednisone taper 50 mg for 2 days, then taper down by 10 mg daily for 2 days until done in 10 days. Levaquin 500 mg daily for 4r days, Cardizem CD extended-release 120 mg daily.  Tiotropium 18 mcg 1 cap inhaled once [name] day. Advair 250/50 mcg 1 puff inhaled 2 times [name] day. Albuterol CFC free 90 mcg inhaled 2 puffs 4 times [name] day as needed for wheezing.   NOTE: Stop taking ibuprofen. The patient will be going home with 2 liters of oxygen via nasal cannula continuous.   DIET: Low sodium, ADA diet.   ACTIVITY: As tolerated.   FOLLOWUP: Please follow with your PCP within 1 to 2 weeks. Please follow with [name] pulmonologist within 1 to 2 weeks and if symptoms, worsen shortness of breath comes back or fevers return, please call your PCP right away.  Stop smoking as discussed.   DISPOSITION: Home.   HISTORY OF PRESENT ILLNESS: For full details of the H and P please see history and physical on January7th by Dr. Juliene PinaMody, but briefly this is [name] 54 year old obese African American female with history of COPD, tobacco abuse who came in with shortness of breath, PND and was noted to have O2 sats of 39% on room air by triage and was started on BiPAP  and admitted to the hospitalist service. She was also noted to have pneumonia.   SIGNIFICANT LABORATORIES AND IMAGING:  Initial BNP was 3414, BUN 16, creatinine 0.94, sodium 134. LFTs:  Albumin was 3, otherwise within normal limits. WBC 14.8. Rapid flu negative. Blood cultures negative. Initial ABG was 7.37 in the pH, pCO2 of 50, pO2 of 48 on oxygen. X-ray of the chest, 1 view, showing findings concerning for right lower lobe pneumonia.   HOSPITAL COURSE: The patient was admitted to the hospitalist service, started on steroids, IV antibiotics nebs around-the-clock. The patient had significant respiratory failure and was significantly hypoxic, was also started on BiPAP. The patient has extensive wheezing and tobacco abuser. The patient also was noted to have pneumonia. In regards to the COPD exacerbation, smoking cessation was strongly encouraged and she was started on nicotine patch as well as standard COPD regimen, however, given the severity of the symptoms, IV steroids were continued until today. She will also be discharged on [name] prolonged steroid taper. She should also be on Spiriva, Advair for now as well as p.r.n. albuterol. She is not on any inhalers at home. Furthermore, pulmonology evaluation and PFTs were strongly recommended to her. In regards to her pneumonia, she has no more fever, leukocytosis has improved. Rapid flu has been negative. Blood cultures have been negative. She was noted to have severe hypoxemia, however, the patient stated  that she is at her baseline now. Without oxygen on ambulation, her O2 sats dropped to the 70s. Currently she is walking in the hallways with oxygen and states that she is at her baseline breathing wise and therefore will be discharged with outpatient follow-up. Of note, her blood pressures were also suboptimally controlled. Her medications have been adjusted and she will be discharged with the above blood pressure medications.  Total time spent:  35 minutes.    CODE STATUS: Patient is full code.    ____________________________ Krystal Eaton, MD sa:ct D: 04/22/2012 13:10:30 ET T: 04/23/2012 07:28:16 ET JOB#: [id number]  cc: Krystal Eaton, MD, <Dictator> Krystal Eaton MD ELECTRONICALLY SIGNED 04/30/2012 20:34

## 2014-08-01 NOTE — H&P (Signed)
ATIENT NAME:  Teresa [name], Berdena A MR#:  [id number] DATE OF BIRTH:  1960/07/27  DATE OF ADMISSION:  04/17/2012  PRIMARY CARE PHYSICIAN: [facility]  CHIEF COMPLAINT: Shortness of breath and wheezing.   HISTORY OF PRESENT ILLNESS: The patient is a 54 year old female with a history of COPD and smoking dependence who presents with the above complaint. Since Saturday, the patient has had gradual onset of 3 to 4 days of cough with light green sputum and shortness of breath, dyspnea on exertion, some PND, orthopnea and wheezing. She also says she has fever and chills. In the ER, she had a chest x-ray which showed right middle lobe pneumonia. She was treated with antibiotics, steroids and placed on BiPAP due to severe hypoxia. She was found to be 39% on room air at triage.    REVIEW OF SYSTEMS: CONSTITUTIONAL: Positive fatigue, fever and weakness. No weight loss or weight gain.  EYES: No blurred or double vision, glaucoma, or cataract.  ENT: No ear pain, hearing loss, seasonal allergies. She does state that she snores.  RESPIRATORY: Positive cough. Positive wheezing. Positive dyspnea. Positive history of COPD, not on oxygen.  CARDIOVASCULAR: She had some chest pain, positive orthopnea, no edema, arrhythmia. She does have, positive dyspnea on exertion. No palpitations or syncope.  GASTROINTESTINAL: No nausea, vomiting, diarrhea, abdominal pain, melena, or ulcers. GENITOURINARY: She denies any dysuria or hematuria.  ENDOCRINE: No nocturia or thyroid problems.  HEMATOLOGIC/LYMPHATIC: No anemia or easy bruising. MUSCULOSKELETAL: No pain in shoulders or knees. No limited activity.  NEUROLOGICAL:  No CVA, TIA or seizures.  PSYCHIATRIC: She does have some anxiety.   PAST MEDICAL HISTORY:  1. COPD, not on oxygen. 2. Panic attacks with anxiety.  3. Diabetes.  4. VSD. 5. History of Bell's palsy.  PAST SURGICAL HISTORY: Ventriculoseptal defect  repair.   MEDICATIONS: 1. Xanax 1 mg q.i.d.  2. Amlodipine 5  mg daily.  3. Aspirin 81 mg daily.  4. Famotidine 20 mg b.i.d.  5. Insulin Detemir 67 units at bedtime.  6. Simvastatin 40 mg daily.   ALLERGIES: The patient told me she does not have any allergies; however, medical chart says that she is allergic to the following medications: AMOXICILLIN, PENICILLIN, SULFA AND SHELLFISH.  FAMILY HISTORY: She does not know any information about her father. She says her mother is healthy.   SOCIAL HISTORY: She continues to smoke 1/2 pack to a pack a day. No alcohol or IV drug.   PHYSICAL EXAMINATION: VITAL SIGNS: Temperature is afebrile, with a pulse of 89, respirations 28, blood pressure 154/70, 95% on BiPAP.  GENERAL: The patient is alert, oriented, does not appear to in acute distress.  HEENT: Head is atraumatic. Pupils are round.  Sclerae are anicteric.  Mucous membranes were not examined due to the patient having the BiPAP. Oropharynx is also not examined.   NECK: Supple without jugular venous distention. No enlarged thyroid.  CARDIOVASCULAR: Regular rate and rhythm. She has a 3 out of 6 systolic murmur heard throughout the precordium without any radiation. PMI is difficult to palpate.  ABDOMEN: Bowel sounds are positive. Nontender, nondistended. No appreciable organomegaly.  LUNGS: She has got diffuse wheezing bilaterally without any rhonchi, rales, or crackles, with fair air movement.  EXTREMITIES: No clubbing, cyanosis, or edema.  NEUROLOGIC: Cranial nerves II through XII are grossly intact. No focal deficits.  SKIN: Without rash or lesion.   LABORATORY, DIAGNOSTIC AND RADIOLOGICAL DATA:  PH 7.37, pCO2 of 50, pO2 48.  This was on room air.  White blood cells 14.8, hemoglobin 10.7, hematocrit 35, platelets are 319. Influenza A and B are negative.   Chest x-ray shows right lower lobe pneumonia.  EKG shows normal sinus rhythm. She does have some T wave inversions in the lateral leads.   ASSESSMENT AND PLAN: This is a 54 year old female who presents  with acute respiratory failure and hypoxia, found to have community-acquired pneumonia.   1. Acute respiratory failure secondary to community-acquired pneumonia on top of acute chronic obstructive pulmonary disease exacerbation: The patient will be treated with BiPAP, antibiotics and steroids as outlined below. 2. Community-acquired pneumonia: The patient will be treated with Levaquin 750 mg IV q. 24 hours. Blood cultures have been ordered in the ER.  3. Acute chronic obstructive pulmonary disease exacerbation: Triggered by her community-acquired pneumonia. We will provide some steroids, continue BiPAP, wean oxygen. We will check an ABG later this afternoon. DuoNebs and antibiotics.  4. Diabetes: We will continue the patient on her outpatient medications with the exception of metformin and will place the patient on an ADA diet, insulin sliding scale.  5. History of ventriculoseptal defect: The patient does have a murmur.  6. History of anxiety:  We will continue with anxiolytics.  7. Chest pain with abnormal EKG: We will go ahead and check troponins and CPKs.  8. smoking dependence: patient counselled elects patch at this time  no plans to quit time spent 3 minutes on counselling   CODE STATUS: The patient is a FULL CODE STATUS.     TIME SPENT: Approximately 40 minutes.  ____________________________ Janyth Contes. Juliene Pina, MD spm:cb D: 04/17/2012 14:54:58 ET T: 04/17/2012 16:23:25 ET JOB#: [id number]  cc: Amia Rynders P. Juliene Pina, MD, <Dictator> [facility] P Sayed Apostol MD ELECTRONICALLY SIGNED 04/17/2012 17:13

## 2014-08-01 NOTE — Discharge Summary (Signed)
PATIENT NAME:  Teresa [name], Teresa [name] MR#:  [id number] DATE OF BIRTH:  1960-09-22  DATE OF ADMISSION:  09/07/2012 DATE OF DISCHARGE:  09/09/2012  ADMITTING PHYSICIAN:  Dr. Amado CoeGouru.  DISCHARGING PHYSICIAN: Enid Baasadhika Ashutosh Dieguez, MD   PATIENT'S PRIMARY MD: Teresa [name].   CONSULTATIONS IN THE HOSPITAL: None.  DISCHARGE DIAGNOSES: 1.  Acute viral gastroenteritis with nausea, vomiting, and abdominal pain.  2.  Diabetes mellitus.  3.  Migraine headaches.  4.  Chronic obstructive pulmonary disease.  5.  Tobacco use disorder.   DISCHARGE HOME MEDICATIONS:  1.  Famotidine 20 mg p.o. b.i.d.  2.  Metformin 1000 mg p.o. b.i.d.  3.  Xanax 1 mg p.o. 3 times [name] day.  4.  Advair 250/50, 1 puff b.i.d.  5.  Albuterol inhaler 2 puffs 4 times [name] day as needed for wheezing.  6.  Aspirin 81 mg p.o. daily.  7.  Fluoxetine 16 mg p.o. daily.  8.  Spiriva 18 mcg inhalation, 1 capsule daily.  9.  Atorvastatin 20 mg p.o. daily.  10.  Norvasc 10 mg p.o. daily.  11.  Percocet 5/325 mg, 1 tablet p.o. 3 times [name] day as needed for pain.   DISCHARGE DIET: ADA 1800 calorie diet, and low sodium diet.   DISCHARGE ACTIVITY: As tolerated.   FOLLOW-UP INSTRUCTIONS:  1.  Patient's Lantus has been held because her sugars were normal here. Patient advised to check sugars at home, and her metformin is being continued if her blood sugars are consistently greater than 200, otherwise to start her Lantus home dose at half the dose before increasing.  2.  Follow up with PCP for diabetes management in 1 to 2 weeks.   LABS AND IMAGING STUDIES: WBC 6.8, hemoglobin 10.3, hematocrit 32.0, platelet count 315.   Sodium 141, potassium 4.0, chloride 105, bicarbonate 32, BUN 11, creatinine 0.72, glucose 124 and calcium of 8.9. Lipase was 301 prior to discharge.   Cardiac enzymes remained negative.   Abdominal ultrasound revealing unremarkable right upper quadrant abdominal ultrasound. Pancreas is not visible due to overlying gas. No  gallstones are evident in the gallbladder, and negative sonographic Murphy's sign present.   HbA1c is 76.5. LDL 55, HDL 48, total cholesterol 213119, triglycerides of 81.   CT of the abdomen and pelvis with contrast on admission showing fat-filled hernia in the epigastric region. No acute abdominal or intrapelvic abnormality evident. Lung base atelectasis and cardiomegaly present.   BRIEF HOSPITAL COURSE: Teresa [name] is [name] 54 year old obese female with [name] past medical history significant for hypertension, diabetes,  COPD on 2 liters home oxygen, hyperlipidemia, was brought in secondary to abdominal pain, nausea and vomiting. She did have [name] mild lipase elevation on admission in the 400s.   1.  Abdominal pain, nausea and vomiting: Initially thought it was acute pancreatitis, but I think it was more like viral gastritis. The mild bump in the lipase is more from her from intraabdominal viral enteritis rather than pancreatitis. Her CT of the abdomen was normal, and abdominal ultrasound was normal. She did recover in less than 24 hours, and her diet was advanced slowly from [name] clear liquid diet to full clear liquid diet to low-residue ADA 1800 diet, and she was able to tolerate it better, so she is being discharged in stable condition.  2.  Insulin-dependent diabetes mellitus: Her HgA1c was only 6.5. She was on the liquid diet here in the hospital. Her insulin was completely held, and her metformin on hold too due to her CT  with contrast. However, her sugars were however being less than 100 to 150 range, so even after she was on [name] regular diet her sugars did not up greater than 150, so she was advised to hold off on the Lantus for now and continue her metformin after discharge, and check her blood sugars. If they are persistently greater than 200 advised to restart Lantus at half the dose, and then follow up with PCP in the next week for further management.  All her other home medications were continued. Her COPD was  stable. She was on 2 liters home oxygen and continue the same here in the hospital, and she was started on Advair and she was already on Spiriva.  CODE STATUS: Since the patient is FULL CODE her course has been otherwise uneventful in the hospital.   DISCHARGE CONDITION: Stable.   DISCHARGE DISPOSITION: Home.   Time spent on discharge: Forty-five minutes.    ____________________________ Enid Baas, MD rk:dm D: 09/10/2012 09:44:29 ET T: 09/10/2012 10:53:19 ET JOB#: [id number]  cc: Enid Baas, MD, <Dictator> Enid Baas MD ELECTRONICALLY SIGNED 09/17/2012 15:31

## 2014-08-01 NOTE — H&P (Signed)
PATIENT NAME:  Teresa [name], Teresa [name] MR#:  [id number] DATE OF BIRTH:  [birth year]  DATE OF ADMISSION:  09/07/2012  PRIMARY CARE PHYSICIAN:  Dr. Ulanda Edison.   REFERRING PHYSICIAN:  Reita May, PA.  CHIEF COMPLAINT:  Abdominal pain associated with nausea for two weeks.   HISTORY OF PRESENT ILLNESS:  The patient is a 54 year old African American female with past medical history of insulin-dependent diabetes mellitus, COPD with chronic respiratory failure, lives on oxygen, is presenting to the ER with a chief complaint of generalized abdominal pain, but more focused in the epigastric and left upper quadrant area associated with nausea for the past two weeks, is presenting to the ER as the nausea is not getting better and the abdominal pain is getting worse.  The patient had a CAT scan of the abdomen and pelvis done in the ER which has revealed stable ventral hernia in the epigastric region, but no acute abdominal or pelvic pathology.  The patient was given 1 liter fluid bolus and hospitalist team is called to admit the patient under observation status.  During my examination, the patient is reporting that her abdominal pain is getting much better and the pain is decreased from 8 out of 10 to 2 out of 10.  Although patient is nauseous she is able to tolerate food and keeping her food down.  She took her Lantus last night at 9:00 p.m. and also taking her medications on a regular basis.  Denies any shortness of breath, but complaining of chest pain radiating from the epigastric area and whenever she turns or tosses the chest pain in the midsternal area is getting worse.  Complaining of a little dizziness, but denies any loss of consciousness.  The patient strongly believes that her pancreas is inflamed, that is why she is getting the pain in the epigastric area.  She denies drinking any alcohol.  Her last bowel movement was today morning.  Denies any diarrhea or constipation.   PAST MEDICAL HISTORY:   Insulin-dependent diabetes mellitus, chronic COPD, chronic oxygen dependent and lives on 2 liters of oxygen, hyperlipidemia, GERD, history of VSD status post repair.   PAST SURGICAL HISTORY:  VSD repair, carpal tunnel and fibroid removal.   ALLERGIES:  She is allergic to SHELLFISH, SULFA, PENICILLIN, AMOXICILLIN.   HOME MEDICATIONS:  Spiriva 18 mcg inhalation 1 inhalation each day once, Percocet 5/325 1 tablet by mouth 3 times a day as needed for pain, Norvasc 10 mg once daily, metformin 1000 mg twice a day, Lantus 60 units subQ once daily at bedtime, furosemide 20 mg once daily, fluoxetine 20 mg 3 capsules once a day, famotidine 10 mg 2 times a day, low dose aspirin 81 mg once daily, atorvastatin 20 mg once daily, alprazolam 1 mg by mouth 3 times a day, Advair Diskus 250/50 1 puff inhalation 2 times a day, albuterol 2 puff inhalation 4 times a day as needed for shortness of breath.   PSYCHOSOCIAL HISTORY:  Lives at home, lives alone, smokes 2 to 3 cigarettes per day.  Denies any alcohol.  Denies any illicit drug usage.   FAMILY HISTORY:  Father died from brown lung.  Mother has history of diabetes mellitus.  REVIEW OF SYSTEMS:   CONSTITUTIONAL:  Complaining of weakness, but denies any weight loss or weight gain.  No fever. No fatigue.    EYES:  Denies any blurry vision, glaucoma, cataracts.  EARS, NOSE, THROAT:  Denies any epistaxis, discharge, snoring, difficulty in swallowing.  RESPIRATION:  Denies any  history of hemoptysis or asthma.  Has history of COPD, lives on 2 liters of oxygen.  CARDIOVASCULAR:  Complaining of reproducible midsternal chest pain in the scar area.  Denies any palpitations or syncope.  GASTROINTESTINAL:  Complaining of persistent nausea for two weeks, but denies any vomiting or diarrhea.  Generalized abdominal pain is present, more located in the epigastric area in the left and right upper quadrant area.  Denies any hematemesis or melena.  Has positive history of GERD.   GENITOURINARY:  No dysuria or hematuria.  GYNECOLOGIC AND BREASTS:  No breast mass or vaginal discharge.  ENDOCRINE:  No polyuria, nocturia or thyroid problems.  HEMATOLOGIC AND LYMPHATIC:  Denies any anemia, easy bruising or bleeding.  INTEGUMENTARY:  No acne, rash, lesions.  MUSCULOSKELETAL:  No joint pain in the neck, back, shoulder.  Denies any gout or arthritis.   NEUROLOGIC:  Denies any history of vertigo, ataxia, dementia, CVA, TIA.  PSYCHIATRIC:  Denies any insomnia, ADD, OCD.   PHYSICAL EXAMINATION: VITAL SIGNS:  Temperature 99.2, pulse is 96, respirations 24, blood pressure 176/72, pulse ox 97% on 2 liters.  GENERAL APPEARANCE:  Not under acute distress.  Moderately built and obese.  HEENT:  Normocephalic, atraumatic.  Pupils are equally reacting to light and accommodation.  No scleral icterus.  No conjunctival injection.  No sinus tenderness.  No postnasal drip.  No pharyngeal exudates.  NECK:  Supple.  No JVD.  No thyromegaly.  No lymphadenopathy.  LUNGS:  Clear to auscultation bilaterally.  No accessory muscle usage.  Positive midsternal anterior chest wall tenderness on palpation.  CARDIAC:  S1, S2 normal.  Regular rate and rhythm.  No murmurs.  No gallops.  GASTROINTESTINAL:  Soft, obese.  Bowel sounds are positive in all four quadrants.  Diffuse tenderness is present, but no rebound tenderness.  More tender in the epigastric and left upper quadrant area as well as right upper quadrant area.  No masses felt.  No hepatosplenomegaly.  NEUROLOGIC:  Awake, alert, oriented x 3.  Motor and sensory are grossly intact.  Cranial nerves II through XII are intact.  Reflexes are 2+.  EXTREMITIES:  No edema.  No cyanosis.  No clubbing.  MUSCULOSKELETAL:  No joint effusion, tenderness or erythema.  SKIN:  Warm to touch.  Normal turgor.  No rashes or no lesions.   LABORATORY DATA:  CAT scan of the abdomen and pelvis has revealed stable fat-filled ventral hernia in the epigastric region.  No  acute abdominal or pelvic abnormality evident.  Lung bases, presumed atelectasis, cardiomegaly.  Chest x-ray PA and lateral, stable chest x-ray with linear areas of chronic atelectasis or fibrosis.  Glucose is 143, BUN 12, creatinine 0.86, sodium 140, potassium 3.3, chloride 103, CO2 29.  GFR greater than 60, anion gap is 8, serum osmolality 282, calcium 9.1, lipase is 618.  LFTs are within normal range.  CK total 130, CPK MB 0.7, troponin less than 0.02.  WBC 9.8, hemoglobin 10.4, hematocrit 32.1, platelets 320.   ASSESSMENT AND PLAN:  A 54 year old African American female presenting to the ER with a chief complaint of two week history of nausea and generalized abdominal pain, but tolerating food, will be admitted with the following assessment and plan. 1.  Generalized abdominal pain, more in the epigastric, left upper quadrant and right upper quadrant area, probably from acute gastritis versus acute pancreatitis.  We will keep her nothing by mouth until her right upper quadrant ultrasound is done, then we will start her on clear  liquid diet.  We will give her IV proton pump inhibitor.  A.m. labs including amylase and lipase.  We will get H. pylori titers.  Morphine IV as needed for pain.   2.  Insulin-dependent diabetes mellitus.  The patient being nothing by mouth temporarily.  We will hold off on her home medications and put her on insulin sliding scale.  3.  Chronic history of chronic obstructive pulmonary disease, stable.  We will continue 2 liters of oxygen as patient has chronic respiratory failure.  4.  Congestive heart failure, stable.  No fluid overload is noted at this time.  With patient being nothing by mouth, we will hold off her home medications for now.  5.  History of gastroesophageal reflux disease.  We will provide her IV proton pump inhibitor.  6.  We will provide her gastrointestinal and deep vein thrombosis prophylaxis.  7.  CODE STATUS:  She is FULL CODE.   The diagnosis and plan  of care was discussed with the patient.  She is aware of the plan.   Total time spent on admission is 45 minutes.    ____________________________ Ramonita LabAruna Jerimey Burridge, MD ag:ea D: 09/07/2012 23:35:48 ET T: 09/08/2012 01:15:10 ET JOB#: [id number]  cc: Ramonita LabAruna Humaira Sculley, MD, <Dictator> Lucillie GarfinkelEmma R. Mayford KnifeWilliams, MD  Ramonita LabARUNA Izick Gasbarro MD ELECTRONICALLY SIGNED 09/15/2012 22:57

## 2014-08-01 NOTE — Consult Note (Signed)
PATIENT NAME:  Teresa [name], [place] A MR#:  [id number] DATE OF BIRTH:  [birth year]  DATE OF CONSULTATION:  08/02/2012  CONSULTING PHYSICIAN:  Herschell Dimesichard J. Renae GlossWieting, MD PRIMARY CARE PHYSICIAN: Lucillie Garfinkelmma R. Mayford KnifeWilliams, MD CARDIOLOGIST: Lamar BlinksBruce J. Kowalski, MD PULMONOLOGIST: Clenton PareHerbon E. Meredeth IdeFleming, MD  EMERGENCY ROOM CONSULTATION AND DISCHARGE  CHIEF COMPLAINT: Swelling.   HISTORY OF PRESENT ILLNESS: This is a 54 year old female who states that she has had swelling increase for 1 month, not sure how much weight gain she has had. She has pain in the legs and feet, shortness of breath with getting out of the car today and she felt a little bit weak. Normally, she wears oxygen at night and wears it also during the day while she is moving around. The ER physician told me that pulse ox dropped down to 83% with walking around, and that was the reason for hospital admission. When I spoke with the patient, she states that she does wear the oxygen when she is walking around most of the time and that the drop in pulse ox does happen for her. She does feel okay. She did respond to the Lasix that was given in the Emergency Room with good urinary output. Her pulse ox was 97% on 2 liters when I saw her.   PAST MEDICAL HISTORY: Diabetes, history of VSD and repair, hypertension, COPD on oxygen, hyperlipidemia, gastroesophageal reflux disease.   PAST SURGICAL HISTORY: VSD repair, carpal tunnel and fibroid removal.   ALLERGIES: AMOXICILLIN, SULFA AND SHELLFISH.   MEDICATIONS: Included Advair Diskus 250/50 one inhalation twice a day, albuterol CFC 2 puffs 4 times a day as needed for wheezing, Xanax 1 mg 3 times a day, atorvastatin 20 mg daily, aspirin 81 mg daily, enalapril 20 mg daily, famotidine 20 mg twice a day, fluoxetine 20 mg 3 capsules daily, Lantus 65 units at bedtime, metformin 1000 mg twice a day, metoprolol 25 mg twice a day, Spiriva 1 inhalation daily, amlodipine 10 mg daily.   SOCIAL HISTORY: Smokes 2 to 3 cigarettes per day.  No alcohol. No drug use. Is on disability.   FAMILY HISTORY: Father died of brown lung, mother with diabetes.   REVIEW OF SYSTEMS:    CONSTITUTIONAL: Positive for weakness. No fever, chills or sweats.  EYES: Positive for blurry vision.  EARS, NOSE, MOUTH AND THROAT: Positive for runny nose. No sore throat. No difficulty swallowing.  CARDIOVASCULAR: Occasional chest pain with activity.  RESPIRATORY: Positive for shortness of breath. Positive for cough, some light green phlegm. No hemoptysis.  GASTROINTESTINAL: Occasional feeling sick with nausea, some abdominal pain but not now. Positive for constipation. No bright red blood per rectum. No melena.  GENITOURINARY: No burning on urination. No hematuria.  MUSCULOSKELETAL: Positive for joint pains.  INTEGUMENTARY: Positive for itching.  NEUROLOGICAL: No fainting or blackouts.  PSYCHIATRIC: On medication for depression.  ENDOCRINE: No thyroid problems.  HEMATOLOGIC AND LYMPHATIC: No anemia.   PHYSICAL EXAMINATION:  VITAL SIGNS: Pulse 85, respirations 18, blood pressure 133/94, pulse ox 96% on oxygen.  GENERAL: No respiratory distress.  EYES: Conjunctivae and lids normal. Pupils equal, round and reactive to light. Extraocular muscles intact. No nystagmus.  EARS, NOSE, MOUTH AND THROAT: Tympanic membranes: No erythema. Nasal mucosa: No erythema. Throat: No erythema, no exudate seen. Lips and gums: No lesions.  NECK: No JVD. No bruits. No lymphadenopathy. No thyromegaly. No thyroid nodules palpated.  LUNGS: Clear to auscultation. No use of accessory muscles to breathe. No rhonchi, rales or wheeze heard.  CARDIOVASCULAR: S1, S2  soft. No gallops or rubs heard, II/VI systolic ejection murmur. Carotid upstroke 2+ bilaterally. No bruits.  EXTREMITIES: Dorsalis pedis pulses 2+ bilaterally, 2+ edema bilateral lower extremities.  ABDOMEN: Soft, nontender. No organomegaly/splenomegaly. Normoactive bowel sounds. No masses felt.  LYMPHATIC: No lymph nodes  in the neck.  MUSCULOSKELETAL: 2+ edema. No clubbing, no cyanosis.  SKIN: No rashes or ulcers seen.  NEUROLOGICAL: Cranial nerves II through XII grossly intact. Deep tendon reflexes 2+ bilateral lower extremities.  PSYCHIATRIC: The patient is oriented to person, place and time.   LABORATORY AND RADIOLOGICAL DATA: Chest x-ray: Mild interstitial edema, likely due to CHF. Glucose 209, BUN 9, creatinine 0.77, sodium 138, potassium 4.2, chloride 104, CO2 of 30, calcium 9.0. Liver function tests normal range. BNP 569. Troponin negative. White blood cell count 9.1, hemoglobin and hematocrit 9.8 and 30.2, platelet count of 337. EKG: Normal sinus rhythm, left atrial enlargement, nonspecific ST-T wave changes. Previous echocardiogram showed an EF of 50% to 55%.   ASSESSMENT AND PLAN:  1.  Respiratory failure: In further speaking with the patient, this is chronic. She is supposed to wear oxygen 24/7. She does have oxygen that drops down when she walks around, and this is usual for her. I advised her to wear her oxygen 24/7. This is likely secondary to chronic obstructive pulmonary disease on home oxygen.  2.  Acute diastolic congestive heart failure with edema. Intravenous Lasix was given in the Emergency Room. She had good pulse oximetry on oxygen supplementation. I will discontinue Norvasc because this can cause swelling and weight gain, and oral Lasix and potassium was given upon discharge. The patient is on a beta blocker and angiotensin-converting enzyme inhibitor already.  3.  For her hypertension, blood pressure will have to be followed up as outpatient with the stopping of Norvasc. Blood pressure on presentation was 140/64. I think the Lasix will help out. Can consider starting Aldactone as outpatient or titrating up some of the medications that she is already on, such as her metoprolol or enalapril.  4.  Hyperlipidemia: I continued her atorvastatin.  5.  Gastroesophageal reflux disease: On famotidine.   6.  Diabetes: She is on metformin and Lantus.  7.  Tobacco abuse: She only smokes 2 to 3 cigarettes per day. I advised her to quit this cold [place]. No need for a nicotine patch. Smoking cessation counseling done 3 minutes by me.   The patient is clinically stable for discharge home. Follow up within 1 week with Dr. Ulanda Edison for blood pressure check and titration of medication if needed.   TIME SPENT ON EMERGENCY ROOM CONSULTATION AND DISCHARGE: 65 minutes.   ____________________________ Herschell Dimes. Renae Gloss, MD rjw:jm D: 08/02/2012 17:15:51 ET T: 08/02/2012 20:00:27 ET JOB#: [id number]  cc: Herschell Dimes. Renae Gloss, MD, <Dictator> Lucillie Garfinkel. Mayford Knife, MD Lamar Blinks, MD Herbon E. Meredeth Ide, MD Salley Scarlet MD ELECTRONICALLY SIGNED 08/11/2012 12:53

## 2014-08-02 NOTE — Discharge Summary (Signed)
PATIENT NAME:  Teresa [name], Dilyn A MR#:  [id number] DATE OF BIRTH:  14-Feb-1961  DATE OF ADMISSION:  01/02/2014 DATE OF DISCHARGE:    PRIMARY CARE PHYSICIAN: [facility].    FINAL DIAGNOSES: 1. Bronchitis.  2.  Chest pain, likely costochondritis from the bronchitis.  3.  Chronic obstructive pulmonary disease with chronic respiratory failure, on home oxygen.  4.  Diabetes.  5.  Hypertension.  6.  Hyperlipidemia.   MEDICATIONS ON DISCHARGE: Include metformin 1000 mg twice a day, aspirin 81 mg daily, fluoxetine 20 mg 3 tablets daily, Spiriva 1 inhalation daily, atorvastatin 20 mg at bedtime, Pepcid 20 mg twice a day, trazodone 100 mg at bedtime, enalapril 20 mg daily, metoprolol 25 mg 1/2 tablet twice a day, furosemide 20 mg daily, ProAir HFA 2 puffs every 4 to 6 hours as needed for wheezing, shortness of breath, Flonase nasal spray 1 spray each nostril daily, Lantus 58 units subcutaneous injection at bedtime, Percocet 5/325 one tablet every 4 to 6 hours as needed for pain, Symbicort 2 puffs twice a day, alprazolam 1 mg 3 times a day, Zithromax 250 mg 1 tablet daily for 4 more days, prednisone taper 5 mg 5 tablets day one, 4 tablets day two, 3 tablets day three, 2 tablets days four, 1 tablet day five and six and then stop, oxygen 2 liters nasal cannula.   DIET: Low sodium, carbohydrate-controlled diet, regular consistency.   ACTIVITY: As tolerated.   FOLLOWUP: [facility] at [facility] in 1 to 2 weeks.   HOSPITAL COURSE: The patient was admitted as an observation for chest pain. Cardiac enzymes were negative x 3. The patient does have a bronchitis and costochondritis by physical exam. The patient was discharged home with a quick prednisone taper and to finish up Zithromax. She was kept on her usual medications for COPD, diabetes, hypertension, and hyperlipidemia. No changes in her usual medications.   TIME SPENT ON DISCHARGE: 35 minutes. The patient was admitted and discharged  same day.    ____________________________ Herschell Dimesichard J. Renae GlossWieting, MD rjw:at D: 01/02/2014 13:04:19 ET T: 01/02/2014 14:13:15 ET JOB#: [id number]  cc: Herschell Dimesichard J. Renae GlossWieting, MD, <Dictator> Duke Salviaynthia B. Tania AdeWeeks, MD Salley ScarletICHARD J Piotr Christopher MD ELECTRONICALLY SIGNED 01/26/2014 12:45

## 2014-08-02 NOTE — H&P (Signed)
PATIENT NAME:  Teresa [name], Teresa [name] MR#:  [id number] DATE OF BIRTH:  04-30-1960  DATE OF ADMISSION:  01/02/2014  REFERRING PHYSICIAN:  Loraine Leriche R. Fanny Bien, MD    PRIMARY CARE PHYSICIAN: Lucillie Garfinkel. Mayford Knife, MD    CHIEF COMPLAINTS: Chest pain, cough.   HISTORY OF PRESENT ILLNESS: This is a 54 year old female with known history of diabetes, COPD, chronic respiratory failure, on 2 liters nasal cannula, who presents with complaints of cough, chest pain. The patient reports she has been having upper respiratory symptoms over the last 2 weeks including cough, runny nose with some productive phlegm as well. Reports over the last 2 days she started to complain of some chest pain, reports her chest pain is midsternal, pressure like, nonradiating, as well reports some accompanying diaphoresis, nausea, vomiting. Reports currently she is chest pain free. Upon further questioning, reports her chest pain is mainly related to cough.   Whenever she coughs it produces the chest pain. As well it was reproducible by palpation on my physical exam. Given patient's risk factors ED physician requested the patient to be admitted for further evaluation. She denies any fever, any chills. Did not have any leukocytosis. Her blood work did not show significant abnormalities.   PAST MEDICAL HISTORY:  1.  Diabetes mellitus, insulin-dependent.  2.  COPD.  3.  Chronic oxygen, respiratory failure, on 2 liters nasal cannula.  4.  Hyperlipidemia.  5.  GERD.  6.  History of VSD, status post repair.   PAST SURGICAL HISTORY:  1.  VSD repair.  2.  Carpal tunnel.   3.  Fibroid removal.   ALLERGIES: SHELLFISH, SULFA, PENICILLIN, AMOXICILLIN.   HOME MEDICATIONS:  1.  Spiriva 18 mcg inhalational once daily.  2.  Percocet as needed.  3.  Aspirin 81 mg daily.  4.  Enalapril 20 mg oral daily.  5.  Fluoxetine 60 mg oral daily.  6.  Trazodone 100 mg oral at bedtime.  7.  Lantus 58 units subcutaneous at bedtime.  8.  Metformin 1000 mg oral 2  times a day.  9.  Atorvastatin 20 mg at bedtime.  10.  Metoprolol 12.5 mg oral 2 times a day.  11.  ProAir as needed.  12.  Spiriva 18 mcg inhalational once daily.  13.  Symbicort 160/4.5 two puffs b.i.d.  14.  Lasix 20 mg daily.  15.  Pepcid 20 mg 2 times a day.  16.  Fluticasone spray once daily.   FAMILY HISTORY: Father died from brown lung. Mother has history of diabetes mellitus.   SOCIAL HISTORY: Lives at home. Lives alone. Smokes 2 to 3 cigarettes per day. No alcohol. No illicit drug use.   REVIEW OF SYSTEMS:   CONSTITUTIONAL:  Denies fever, chills. Reports fatigue, weakness. Denies weight gain or weight loss.  EYES: Denies blurry vision, double vision, inflammation.  ENT: Denies  hearing loss or dysphagia.  RESPIRATORY: Reports cough, productive sputum and history of COPD, on 2 liters nasal cannula at baseline.  CARDIOVASCULAR: Complains of chest pain reproducible by palpation and by cough. No palpitation or syncope.  GASTROINTESTINAL: Denies nausea, vomiting, diarrhea, abdominal pain, hematemesis.  GENITOURINARY: Denies dysuria, hematuria, renal colic.  ENDOCRINE: Denies polyuria, polydipsia, heat or cold intolerance.  HEMATOLOGY: Denies anemia, easy bruising, bleeding diathesis.  INTEGUMENT: Denies acne, rash, or skin lesion.  MUSCULOSKELETAL: Denies any gout, cramps, arthritis.  NEUROLOGIC: Denies any history of CVA, TIA, ataxia, vertigo.  PSYCHIATRIC: Denies anxiety, insomnia, or depression.   PHYSICAL EXAMINATION:  VITAL SIGNS: Temperature 96.2,  pulse 73, respiratory rate 17, blood pressure 105/68, saturating 100% on 2 liters nasal cannula.   GENERAL: Well-nourished female, looks comfortable in bed, in no apparent distress.  HEENT: Head atraumatic, normocephalic. Pupils equal, reactive to light. Pink conjunctivae. Anicteric sclerae. Moist oral mucosa.  NECK: Supple. No thyromegaly. No JVD. No nuchal rigidity. Trachea is midline.  RESPIRATORY:   Good air entry  bilaterally. No wheezing, rales, rhonchi. No use of accessory muscles.  CHEST: The patient has reproducible mid sternal chest pain on palpation, resembles chest pain upon presentation.  CARDIOVASCULAR: S1, S2 heard. No rubs, murmur, gallops.  ABDOMEN: Soft, nontender, nondistended. Bowel sounds present.  EXTREMITIES: No edema. No clubbing. No cyanosis. Pedal pulses +2 bilaterally.  PSYCHIATRIC: Appropriate affect. Awake, alert x3. Intact judgment and insight.  NEUROLOGIC: Cranial nerves grossly intact. Motors 5/5.  No focal deficits. Sensation symmetrical and intact to light touch.  MUSCULOSKELETAL: No joint effusion or erythema.  SKIN: Normal skin turgor. Warm and dry.  LYMPHATICS: No cervical lymphadenopathy.   PERTINENT LABORATORIES: Glucose 98, BNP 259, BUN 12, creatinine 1.04, sodium 142, potassium 3.3, chloride 104, CO2 34. Troponin less than 0.02, CK total 278, CK-MB 2.4, ALT 28, AST 30, alkaline phosphatase 72. White blood cells 10.2, hemoglobin 10.7, platelets 317.   EKG showing normal sinus rhythm without significant changes from previous.   ASSESSMENT AND PLAN:  1.  Chest pain, has atypical features, musculoskeletal, is reproducible by palpation and related by cough, but given the patient's factors, ED physician requested admission for further evaluation. At this point, we will admit patient to tele. She already received aspirin. She is chest pain free. We will cycle her cardiac enzymes x3. If negative, she can follow with her cardiologist as an outpatient, Dr. Gwen PoundsKowalski, for further workup as I will not do any stress test for her given her current acute bronchitis or URI.  2.  Acute bronchitis/upper respiratory infection. We will continue patient on erythromycin. She can be discharged on Z-Pak as an outpatient.  3.  Diabetes mellitus, start the patient on insulin sliding scale. Continue her Lantus at a lower dose and we will hold her metformin.  4.  Hypertension. Blood pressure  acceptable. Continue on lisinopril and metoprolol.  5.  Hyperlipidemia. Continue with statin.  6.  Chronic obstructive pulmonary disease. The patient has no active wheezing. Continue with p.r.n. albuterol and Symbicort and Spiriva.  7.  Chronic respiratory failure, at baseline. Continue with oxygen at 2 liters nasal cannula. 8.  Hyperlipidemia. Continue with statin.  8.  Deep vein thrombosis prophylaxis, subcutaneous heparin.  9.  Code status: Full code.   TOTAL TIME SPENT ON ADMISSION AND PATIENT CARE: 55 minutes.     ____________________________ Starleen Armsawood S. Elgergawy, MD dse:AT D: 01/02/2014 00:46:01 ET T: 01/02/2014 01:45:03 ET JOB#: [id number]  cc: Starleen Armsawood S. Elgergawy, MD, <Dictator> DAWOOD Teena IraniS ELGERGAWY MD ELECTRONICALLY SIGNED 01/02/2014 5:49

## 2014-08-03 NOTE — Discharge Summary (Signed)
PATIENT NAME:  Teresa [name], Teresa [name] DATE OF BIRTH:  1960/07/03  DATE OF ADMISSION:  05/25/2011 DATE OF DISCHARGE:  05/26/2011  DISCHARGE DIAGNOSES:  1. Atypical noncardiac chest pain, possibly related to anxiety. 2. Diabetes. 3. Hypertension. 4. Hyperlipidemia. 5. Anxiety. 6. Depression. 7. Tobacco abuse.  8. History of VSD repair in 2007.  DISPOSITION: The patient is being discharged home.  DISCHARGE FOLLOWUP:  [facility] 1 to 2 weeks after discharge.   DIET: Low sodium, 1800 calorie ADA diet.   ACTIVITY: As tolerated.   DISCHARGE MEDICATIONS:  1. Albuterol 2 puffs every 6 hours p.r.n.  2. Tylenol/hydrocodone 5/325 mg 1 tablet every six hours p.r.n.  3. Reglan 10 mg four times daily. 4. HCTZ 25 mg daily.  5. Enalapril 5 mg daily.  6. Aspirin 81 mg daily.  7. Metformin 1000 mg twice [name] day. 8. Pepcid 20 mg twice [name] day. 9. Fluoxetine 50 mg daily.  10. Simvastatin 40 mg daily.  11. Insulin Detemir 52 units subcutaneous at bedtime.  12. Neurontin 500 mg three times daily.  13. Xanax 1 mg three times daily p.r.n.    RESULTS: Inpatient nuclear stress test showed no evidence of any ischemia.   Echocardiogram: Status post VSD, repaired. There is no VSD visualized. Left ventricular systolic function is normal. Ejection fraction more than 55%. Mild concentric LVH. Mild to moderate anterior wall hypokinesis. Moderate to severe septal hypokinesis. Septal motion is consistent with postoperative state.   Cardiac enzymes negative.  CBC showed hemoglobin of 10.4. Normal white count. Normal platelet count. Glucose 224. The rest of the complete metabolic panel was normal. Cardiac enzymes: Serial cardiac enzymes were negative.   HOSPITAL COURSE: The patient is [name] 54 year old female with past medical history of VSD repair, hypertension, diabetes, and anxiety and depression who presented with chest pain. The patient was ruled out with  three sets of cardiac enzymes for any acute coronary syndrome. She reported history of anxiety and depression and having increased stress at home. She had [name] catheterization done in July 2011 which was essentially normal and had shown [name] normal ejection fraction. In view of her risk factors, the patient underwent an inpatient nuclear stress test which was negative for any ischemia. An echo was also done which showed good ejection fraction with no evidence of any residual VSD. The patient's chest pain is possibly related to her anxiety. She also smokes extensively and has been counseled for more than three minutes. She is being discharged home in [name] stable condition.   TIME SPENT: 45 minutes. ____________________________ Darrick MeigsSangeeta Shruti Arrey, MD sp:slb D: 05/26/2011 16:29:11 ET T: 05/27/2011 14:20:25 ET JOB#: [id number]  cc: Darrick MeigsSangeeta Paighton Godette, MD, <Dictator> Darrick MeigsSANGEETA Tobin Cadiente MD ELECTRONICALLY SIGNED 05/27/2011 16:50

## 2014-08-03 NOTE — H&P (Signed)
PATIENT NAME:  Teresa [name], Teresa [name] MR#:  [id number] DATE OF BIRTH:  January 08, 1961  DATE OF ADMISSION:  05/25/2011  PRIMARY CARE PHYSICIAN: Dr. Bethena Roys at [facility]  CHIEF COMPLAINT: Chest pressure.   HISTORY OF PRESENT ILLNESS: This is a 54 year old female has a history of a VSD repair in 2007. She said she has ongoing chest pressure on and off for quite some time, but it was much worse today and she became short of breath, which was new to her. She comes in. She has been having some off and on chest pressure here but is at this moment comfortable. We are going to go ahead and admit her for further evaluation.   PAST MEDICAL HISTORY:  1. Non-insulin-dependent diabetes.  2. Hypertension.  3. Hyperlipidemia.  4. Anxiety/depression.  5. Ongoing tobacco abuse.  6. History of VSD.   PAST SURGICAL HISTORY: VSD repair in 2007.   ALLERGIES: Amoxicillin, penicillin and sulfa drugs.   CURRENT MEDICATIONS:  1. Aspirin 81 mg daily.  2. Enalapril 5 mg daily.  3. Famotidine 20 mg b.i.d.  4. Fluoxetine 60 mg daily.  5. Hydrochlorothiazide 25 mg daily.  6. Levemir 52 units subcutaneous daily.  7. Metformin 1000 mg b.i.d.  8. Simvastatin 40 mg daily.  9. Xanax 1 mg t.i.d. p.r.n.  10. Metoprolol 25 mg b.i.d.   SOCIAL HISTORY: Continues to smoke cigarettes. Does not drink alcohol.   FAMILY HISTORY: Significant for hypertension, diabetes, and coronary artery disease.    REVIEW OF SYSTEMS: CONSTITUTIONAL: No fever or chills. EYES: No blurred vision. ENT: No hearing loss. CARDIOVASCULAR: Some chest pressure. PULMONARY: Some shortness of breath. GASTROINTESTINAL: No nausea, vomiting, or diarrhea. GENITOURINARY: No dysuria. ENDOCRINE: No heat or cold intolerance. INTEGUMENT: No rash. MUSCULOSKELETAL: Occasional joint pain. NEUROLOGIC: No numbness or weakness.   PHYSICAL EXAMINATION:  VITAL SIGNS: Temperature 97.7, pulse 82, respirations 20, blood pressure 98/65.   GENERAL: This is an obese, black  female in no acute distress.   HEENT: The pupils are equal, round, reactive to light. Sclerae is not icteric. The oral mucosa is moist. Oropharynx is clear. Nasopharynx is clear.   NECK: Supple. No JVD, lymphadenopathy or thyromegaly.   CARDIOVASCULAR: Regular rate and rhythm. There is a 1/6 systolic murmur.   LUNGS: Clear to auscultation. No dullness to percussion. No use of accessory muscles.   ABDOMEN: Soft, nontender, nondistended. Bowel sounds are positive. No hepatosplenomegaly. No masses.   EXTREMITIES: There is 1+ lower extremity edema.   NEUROLOGIC: Cranial nerves II through XII are intact. She is alert and oriented x4. There is no decrease in sensation in the upper and lower extremities.   SKIN: Moist with no rash.   LABORATORY, DIAGNOSTIC AND RADIOLOGICAL DATA: EKG shows normal sinus rhythm. Troponin is less than 0.02.   ASSESSMENT AND PLAN:  1. Chest pain. She has had more symptoms of chest pressure. The shortness of breath and was new for her. She does have risk factors for coronary artery disease. I am going to go ahead and start her on full dose aspirin, give her Lovenox and continue her beta blocker. Will go ahead rule her out and set up for stress testing in the morning.  2. History of ventricular septal defect. She has not had this evaluated in a while; will go ahead and get an echocardiogram.  3. Non-insulin-dependent diabetes. Will continue her Levemir plus some sliding scale.  4. Hypertension. Will continue her current medications.   TIME SPENT ON ADMISSION: 40 minutes.  ____________________________ Gracelyn NurseJohn D. Johnston, MD jdj:cms D: 05/25/2011 19:34:11 ET T: 05/26/2011 06:52:40 ET JOB#: [id number]  cc: Gracelyn NurseJohn D. Johnston, MD, <Dictator> Dr[facility] at [facility]  JOHN D JOHNSTON MD ELECTRONICALLY SIGNED 06/04/2011 17:32

## 2014-08-04 DIAGNOSIS — I10 Essential (primary) hypertension: Secondary | ICD-10-CM | POA: Insufficient documentation

## 2014-08-10 NOTE — H&P (Signed)
PATIENT NAME:  Teresa [name], Teresa [name] MR#:  [id number] DATE OF BIRTH:  02/28/1961  DATE OF ADMISSION:  07/11/2014  REFERRING PHYSICIAN: Enedina Finner. Manson Passey, MD  PRIMARY CARE PRACTITIONER: Lucillie Garfinkel. Mayford Knife, MD  ADMITTING PHYSICIAN: Crissie Figures, MD   CHIEF COMPLAINT: Ongoing abdominal pain with nausea for the past 3 weeks.   HISTORY OF PRESENT ILLNESS: A 54 year old, African American female with a history of COPD on home oxygen, hypertension, history of pancreatitis, Bell palsy, depression/panic disorder, diabetes mellitus type 2 with peripheral neuropathy, presents with the complaints of ongoing diffuse abdominal pain with associated nausea for the past 3 weeks. Symptoms increased yesterday. Hence, came to the Emergency Room for further evaluation. Denies any fever. She does have some chronic cough, but no shortness of breath, no wheezing. No chest pain. No chills. No vomiting. No diarrhea. In the Emergency Room, the patient was evaluated by the ED physician and was found to have stable vitals, and work-up revealed mildly elevated lipase at 63. The rest of the labs were unremarkable. The patient also underwent a CT scan of the abdomen and the pelvis, which was unremarkable for any acute intra-abdominal pathology, including pancreatic inflammation , and showed some hepatic steatosis. In view of her continued symptoms with abdominal pain and nausea, despite receiving multiple doses of IV pain medications, she continues to have significant abdominal pain with nausea. Hence, hospitalist service was consulted for further evaluation and management. The patient still complains of abdominal pain, which is slightly better with the pain medications, but is not back to her baseline.    PAST MEDICAL HISTORY: 1. COPD on home oxygen.  2. Hypertension.  3. History of pancreatitis.  4. Bell palsy. 5. Depression.  6. Diabetes mellitus, type 2.  7. Peripheral neuropathy.   PAST SURGICAL HISTORY:  1.  CTS release  surgery.  2.  VSD repair. 3.  Fibroid removal.  ALLERGIES:  1.  AMOXICILLIN.  2.  SULFA.  3.  PENICILLIN.   4.  SHELLFISH.  FAMILY HISTORY: Father died from brown lung. Mother with history of diabetes mellitus.   SOCIAL HISTORY: He lives at home and lives alone. Smokes about 2 to 3 cigarettes per day. No history of alcohol or illicit drug usage.    HOME MEDICATIONS:  1. Alprazolam 1 mg tablet 1 tablet orally 3 times a day.  2. Atorvastatin 20 mg 1 tablet orally once a day.  3. Aspirin 81 mg 1 tablet orally once a day.  4. Enalapril 20 mg 1 tablet orally once a day.  5. Fluoxetine 20 mg 3 capsules orally once a day.  6. Fluticasone nasal spray, 1 spray each nostril once a day.  7. Furosemide 20 mg 1 tablet orally once a day.  8. Ibuprofen  600 mg tablet 1 tablet orally 4 times a day for pain as needed.  9.  Ketorolac 10 mg 1 tablet orally 3 times a day for 5 days.  10.  Lantus 58 units subcutaneous once a day at bedtime.  11.  Metformin 1000 mg oral tablet 1 tablet 2 times a day.  12.  Metoprolol tartrate 25 mg half tablet 2 times a day.  13.  MiraLax oral powder p.r.n. for constipation.  14.  Ondansetron 4 mg oral tablet every 4 hours as needed for nausea or vomiting.  15.  Pepcid 20 mg 1 tablet orally 2 times a day.  16.  Percocet 5/325 mg 1 tablet orally every 4 hours as needed for pain.  17.  ProAir inhaler as needed for shortness of breath.  18.  Spiriva 80 mcg 1 inhalation once a day.  19.  Symbicort 160/4.5 mcg 1 puff twice a day.  20.  Trazodone 100 mg 1 tablet orally once a day.   REVIEW OF SYSTEMS: CONSTITUTIONAL: Negative for fever, chills. No fatigue. No generalized weakness.  EYES: Negative for blurred vision, double vision. No pain. No redness. No discharge.  EARS, NOSE, AND THROAT: Negative for tinnitus, ear pain, hearing loss, epistaxis, nasal discharge.  RESPIRATORY: Positive for chronic cough, but negative for wheezing, dyspnea. No hemoptysis. No painful  respiration.  CARDIOVASCULAR: Negative for chest pain, palpitations, dizziness, syncopal episodes, orthopnea, dyspnea on exertion, or pedal edema.  GASTROINTESTINAL: Positive for nausea with diffuse abdominal pain ongoing for the past 3 weeks. Negative for vomiting. No diarrhea. No constipation. No rectal bleeding.  GENITOURINARY: Negative for dysuria, frequency, urgency or hematuria.  ENDOCRINE: Negative for polyuria, nocturia, heat or cold intolerance.  HEMATOLOGIC: Negative for any major bruising or bleeding.  INTEGUMENTARY: Negative for acne, skin rash or lesions.  MUSCULOSKELETAL: Negative for neck or back pain or shoulder pain.  NEUROLOGIC: Negative for focal weakness or numbness. No history of CVA, TIA or seizure disorder.  PSYCHIATRIC: Positive for depression, stable on home medications.   PHYSICAL EXAMINATION: VITAL SIGNS: Temperature 98.6 degrees Fahrenheit, pulse rate 87, respirations 20 per minute, blood pressure 124/71, oxygen saturation 96% on room air.  GENERAL: Well-developed, well-nourished, alert, in no acute distress, comfortably resting in bed.  HEAD: Atraumatic, normocephalic.  EYES: Pupils are equal, reactive to light and accommodation. No conjunctival pallor. No icterus. Extraocular movements intact.  NOSE: No drainage. No lesions.  EXTREMITIES: No drainage, no external lesions.  ORAL CAVITY: No mucositis.  NECK: Supple. No JVD. No carotid bruit. No thyromegaly. Range of motion of the neck is within normal limits.   RESPIRATORY: Good respiratory effort. Not using accessory muscles of respiration. Bilateral vesicular breath sounds present. No rales or rhonchi.  CARDIOVASCULAR: S1, S2 regular. No murmurs, gallops or clicks. Pulses equal at carotid, femoral and pedal pulses. No peripheral edema.  GASTROINTESTINAL: Abdomen is soft. No hepatosplenomegaly. No masses noted. No guarding. Mild diffuse tenderness present, more on the right side. Bowel sounds present and equal in  all 4 quadrants.  GENITOURINARY: Deferred.   MUSCULOSKELETAL: No joint tenderness or effusion. Range of motion is adequate. Strength and tone equal bilaterally.  SKIN: Inspection within normal limits.  LYMPHATIC: No cervical lymphadenopathy.  VASCULAR: Good dorsalis pedis and posterior tibial pulses.  NEUROLOGIC: Alert, awake and oriented x3. Cranial nerves II through XII grossly intact. No sensory deficit. Motor strength 5/5 in both upper and lower extremities. DTRs 2+ bilateral and symmetrical.  PSYCHIATRIC: Alert, awake and oriented x3. Judgment and insight adequate. Memory and mood within normal limits.    ANCILLARY DATA:  LABORATORY DATA: Serum glucose 134, BUN 19, creatinine 1.0, sodium 134, potassium 3.7, chloride 99, bicarbonate 26, total calcium 8.6, lipase 63, total protein 7.5, albumin 3.9, total bilirubin 0.4, alkaline phosphatase 81, AST 22, ALT 16. WBC is 9.7, hemoglobin 11.3, hematocrit 36.1, platelet count 338,000.  Urinalysis: WBC 2, bacteria none.   CT of the abdomen and the pelvis with contrast:  1.  No acute abnormality in the abdomen and pelvis; particularly no findings of pancreatic inflammation.  2.  Mild hepatic steatosis.    ASSESSMENT AND PLAN: A 54 year old, African American female with a history of chronic obstructive pulmonary disease on home oxygen, hypertension, diabetes mellitus type 2, chronic  pancreatitis, depression, presents with complaints of ongoing diffuse abdominal pain with associated nausea for the past 2 weeks, found to have mildly elevated lipase of 63. CT of the abdomen negative for acute intra-abdominal findings.  1.   Generalized abdominal pain with associated nausea of 2 weeks duration with mildly elevated lipase, suggestive of acute pancreatitis. Plan: Admit to medical floor. Keep her n.p.o. except ice chips, IV fluids, IV pain control medications. Follow up lipase, and we will request for GI consultation for further advice. Check fasting lipids.  2.   Chronic obstructive pulmonary disease on home oxygen, stable on home medications. No acute exacerbations. Continue home medications, oxygen supplementation.  3.  Hypertension, stable on home medications. Continue same.  4.  Diabetes mellitus type 2, stable on home medications. Continue same along with sliding scale insulin.  5.  Depression, stable on home medications. Continue same.  6.  Deep vein thrombosis prophylaxis: Subcutaneous Lovenox.  7.  Gastrointestinal prophylaxis: Proton pump inhibitor.   CODE STATUS: Full code.   TIME SPENT: 50 minutes.    ____________________________ Crissie FiguresEdavally N. Jacksyn Beeks, MD enr:je D: 07/11/2014 05:04:16 ET T: 07/11/2014 06:51:17 ET JOB#: [id number]  cc: Crissie FiguresEdavally N. Raychell Holcomb, MD, <Dictator> Lucillie GarfinkelEmma R. Mayford KnifeWilliams, MD Crissie FiguresEDAVALLY N Raynetta Osterloh MD ELECTRONICALLY SIGNED 07/11/2014 18:28

## 2014-08-10 NOTE — Discharge Summary (Signed)
PATIENT NAME:  [name], [name] MR#:  [name] DATE OF BIRTH:  05-Apr-1961  DATE OF ADMISSION:  07/11/2014 DATE OF DISCHARGE:    PRIMARY CARE PHYSICIAN: Lucillie Garfinkelmma R. Mayford KnifeWilliams, MD.   DISCHARGE DIAGNOSES: Acute pancreatitis, chronic obstructive pulmonary disease on home oxygen, hypertension, diabetes, morbid obesity.   CONDITION: Stable.  CODE STATUS: Full code.   HOME MEDICATIONS: Please refer to the medication reconciliation list.   DIET: Low-sodium, low-fat, low-cholesterol, ADA diet.   ACTIVITY: As tolerated.   FOLLOWUP CARE: Follow up with PCP within 1 to 2 weeks.   REASON FOR ADMISSION: Ongoing abdominal pain with nausea for 3 weeks.   HOSPITAL COURSE: 1. The patient is a 54 year old African-American female with a history of COPD on home oxygen, history of pancreatitis, who came to the ED for abdominal pain with nausea for 3 weeks The patient's lipase is mildly elevated at 63. The rest of the labs were unremarkable. CAT scan of abdomen and pelvis was unremarkable. The patient was admitted for pancreatitis. After admission, the patient was kept n.p.o. with pain medication p.r.n. The patient has no nausea, vomiting or diarrhea but has abdominal pain. The patient was started with a diet. She tolerated diet well. She is clinically stable and will be discharged to home today.  2. COPD on home oxygen, stable.  3. Hypertension and diabetes, stable. Continue home medication.  4. I discussed the patient's discharge plan with the patient, nurse, and case manager.   TIME SPENT: About 35 minutes.    ____________________________ Teresa PollackQing Lesbia Ottaway, MD qc:jh D: 07/11/2014 15:28:44 ET T: 07/11/2014 16:06:11 ET JOB#: [id number]  cc: Teresa PollackQing Taeya Theall, MD, <Dictator> Teresa PollackQING Debraann Livingstone MD ELECTRONICALLY SIGNED 07/11/2014 16:46

## 2014-08-19 DIAGNOSIS — F17200 Nicotine dependence, unspecified, uncomplicated: Secondary | ICD-10-CM | POA: Insufficient documentation

## 2014-09-08 DIAGNOSIS — R0789 Other chest pain: Secondary | ICD-10-CM | POA: Insufficient documentation

## 2014-10-06 ENCOUNTER — Ambulatory Visit (INDEPENDENT_AMBULATORY_CARE_PROVIDER_SITE_OTHER): Payer: Medicare Other | Admitting: Podiatry

## 2014-10-06 DIAGNOSIS — B351 Tinea unguium: Secondary | ICD-10-CM | POA: Diagnosis not present

## 2014-10-06 DIAGNOSIS — M79676 Pain in unspecified toe(s): Secondary | ICD-10-CM | POA: Diagnosis not present

## 2014-10-06 LAB — HM DIABETES FOOT EXAM

## 2014-10-06 NOTE — Progress Notes (Signed)
Presents today chief complaint of painful elongated toenails. Patient states that heel pain is better.  Objective: Pulses are palpable bilateral nails are thick, yellow dystrophic onychomycosis and still some discomfort plantar heels bilateral.   Assessment: Onychomycosis with pain in limb. Plantar fasciitis bilateral.  Plan: Treatment of nails in thickness and length as covered service secondary to pain.

## 2014-12-06 ENCOUNTER — Emergency Department: Payer: Medicare Other

## 2014-12-06 ENCOUNTER — Emergency Department
Admission: EM | Admit: 2014-12-06 | Discharge: 2014-12-06 | Disposition: A | Discharge status: Home / Self Care | Payer: Medicare Other | Attending: Emergency Medicine | Admitting: Emergency Medicine

## 2014-12-06 ENCOUNTER — Encounter: Payer: Self-pay | Admitting: Medical Oncology

## 2014-12-06 DIAGNOSIS — Z880 Allergy status to penicillin: Secondary | ICD-10-CM | POA: Insufficient documentation

## 2014-12-06 DIAGNOSIS — R42 Dizziness and giddiness: Secondary | ICD-10-CM | POA: Insufficient documentation

## 2014-12-06 DIAGNOSIS — Z720 Tobacco use: Secondary | ICD-10-CM | POA: Insufficient documentation

## 2014-12-06 DIAGNOSIS — R739 Hyperglycemia, unspecified: Secondary | ICD-10-CM

## 2014-12-06 DIAGNOSIS — E1165 Type 2 diabetes mellitus with hyperglycemia: Secondary | ICD-10-CM | POA: Diagnosis present

## 2014-12-06 DIAGNOSIS — I10 Essential (primary) hypertension: Secondary | ICD-10-CM | POA: Insufficient documentation

## 2014-12-06 DIAGNOSIS — J449 Chronic obstructive pulmonary disease, unspecified: Secondary | ICD-10-CM | POA: Insufficient documentation

## 2014-12-06 HISTORY — DX: Chronic obstructive pulmonary disease, unspecified: J44.9

## 2014-12-06 HISTORY — DX: Acute pancreatitis without necrosis or infection, unspecified: K85.90

## 2014-12-06 HISTORY — DX: Essential (primary) hypertension: I10

## 2014-12-06 HISTORY — DX: Sleep apnea, unspecified: G47.30

## 2014-12-06 LAB — BASIC METABOLIC PANEL
ANION GAP: 10 (ref 5–15)
BUN: 15 mg/dL (ref 6–20)
CHLORIDE: 98 mmol/L — AB (ref 101–111)
CO2: 29 mmol/L (ref 22–32)
Calcium: 8.9 mg/dL (ref 8.9–10.3)
Creatinine, Ser: 1.17 mg/dL — ABNORMAL HIGH (ref 0.44–1.00)
GFR calc Af Amer: >60 mL/min (ref >60)
GFR, EST NON AFRICAN AMERICAN: 52 mL/min — AB (ref >60)
Glucose, Bld: 394 mg/dL — ABNORMAL HIGH (ref 65–99)
POTASSIUM: 4.1 mmol/L (ref 3.5–5.1)
SODIUM: 137 mmol/L (ref 135–145)

## 2014-12-06 LAB — URINALYSIS COMPLETE WITH MICROSCOPIC (ARMC ONLY)
Bilirubin Urine: NEGATIVE
Glucose, UA: >500 mg/dL — AB
HGB URINE DIPSTICK: NEGATIVE
Ketones, ur: NEGATIVE mg/dL
LEUKOCYTES UA: NEGATIVE
NITRITE: NEGATIVE
PH: 5 (ref 5.0–8.0)
PROTEIN: NEGATIVE mg/dL
SPECIFIC GRAVITY, URINE: 1.01 (ref 1.005–1.030)

## 2014-12-06 LAB — CBC
HEMATOCRIT: 34.2 % — AB (ref 35.0–47.0)
HEMOGLOBIN: 11 g/dL — AB (ref 12.0–16.0)
MCH: 29.4 pg (ref 26.0–34.0)
MCHC: 32.3 g/dL (ref 32.0–36.0)
MCV: 91 fL (ref 80.0–100.0)
Platelets: 297 10*3/uL (ref 150–440)
RBC: 3.76 MIL/uL — ABNORMAL LOW (ref 3.80–5.20)
RDW: 15.2 % — AB (ref 11.5–14.5)
WBC: 7.9 10*3/uL (ref 3.6–11.0)

## 2014-12-06 LAB — GLUCOSE, CAPILLARY: GLUCOSE-CAPILLARY: 386 mg/dL — AB (ref 65–99)

## 2014-12-06 LAB — TROPONIN I

## 2014-12-06 IMAGING — CR DG CHEST 2V
1 series · 2 of 2 positions shown · non-contrast
Comparison: [DATE]

CLINICAL DATA: Difficulty breathing for 2 weeks.

EXAM:
CHEST  2 VIEW

[Series 1: dg chest 2 view · 0.14mm/px · 2 of 2 slices shown]
[im 1/2]
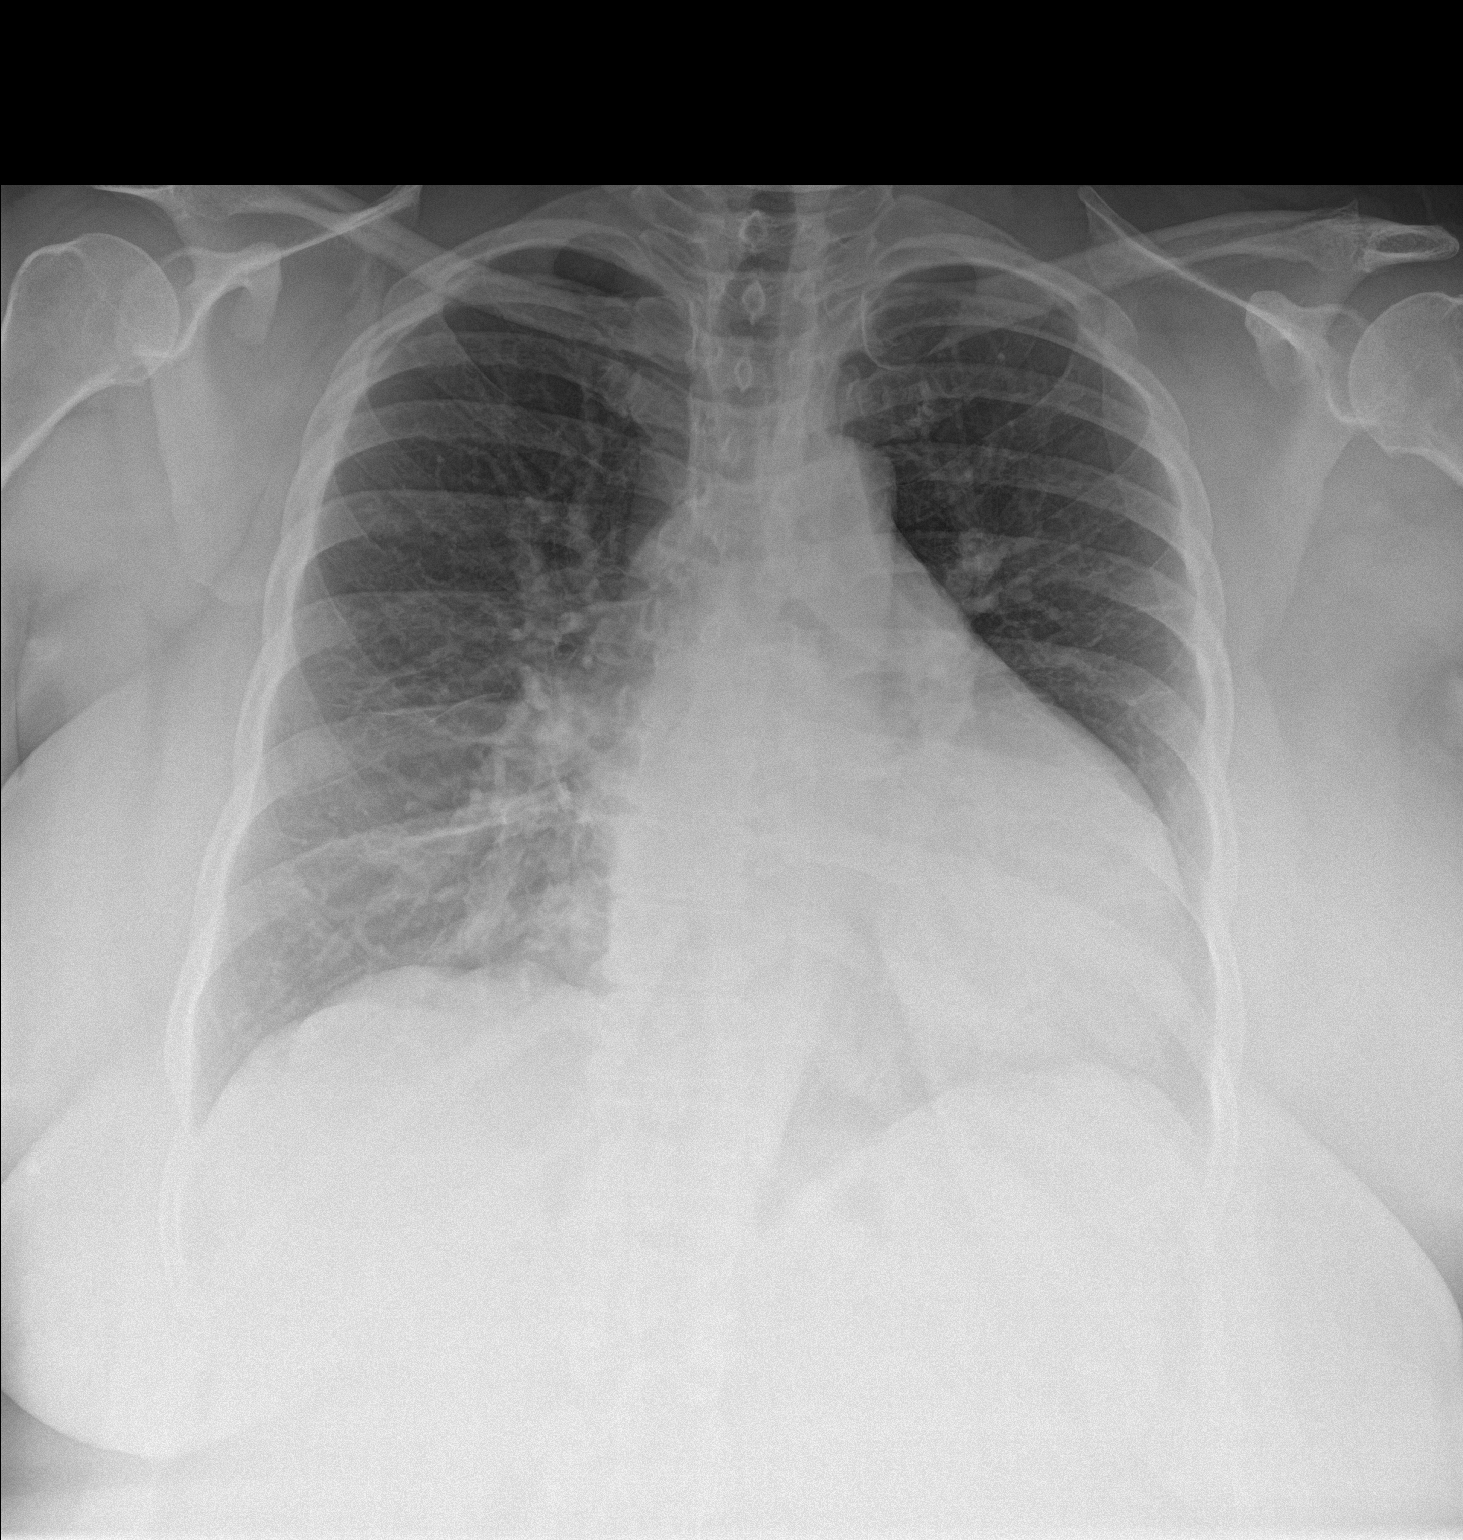
[im 2/2]
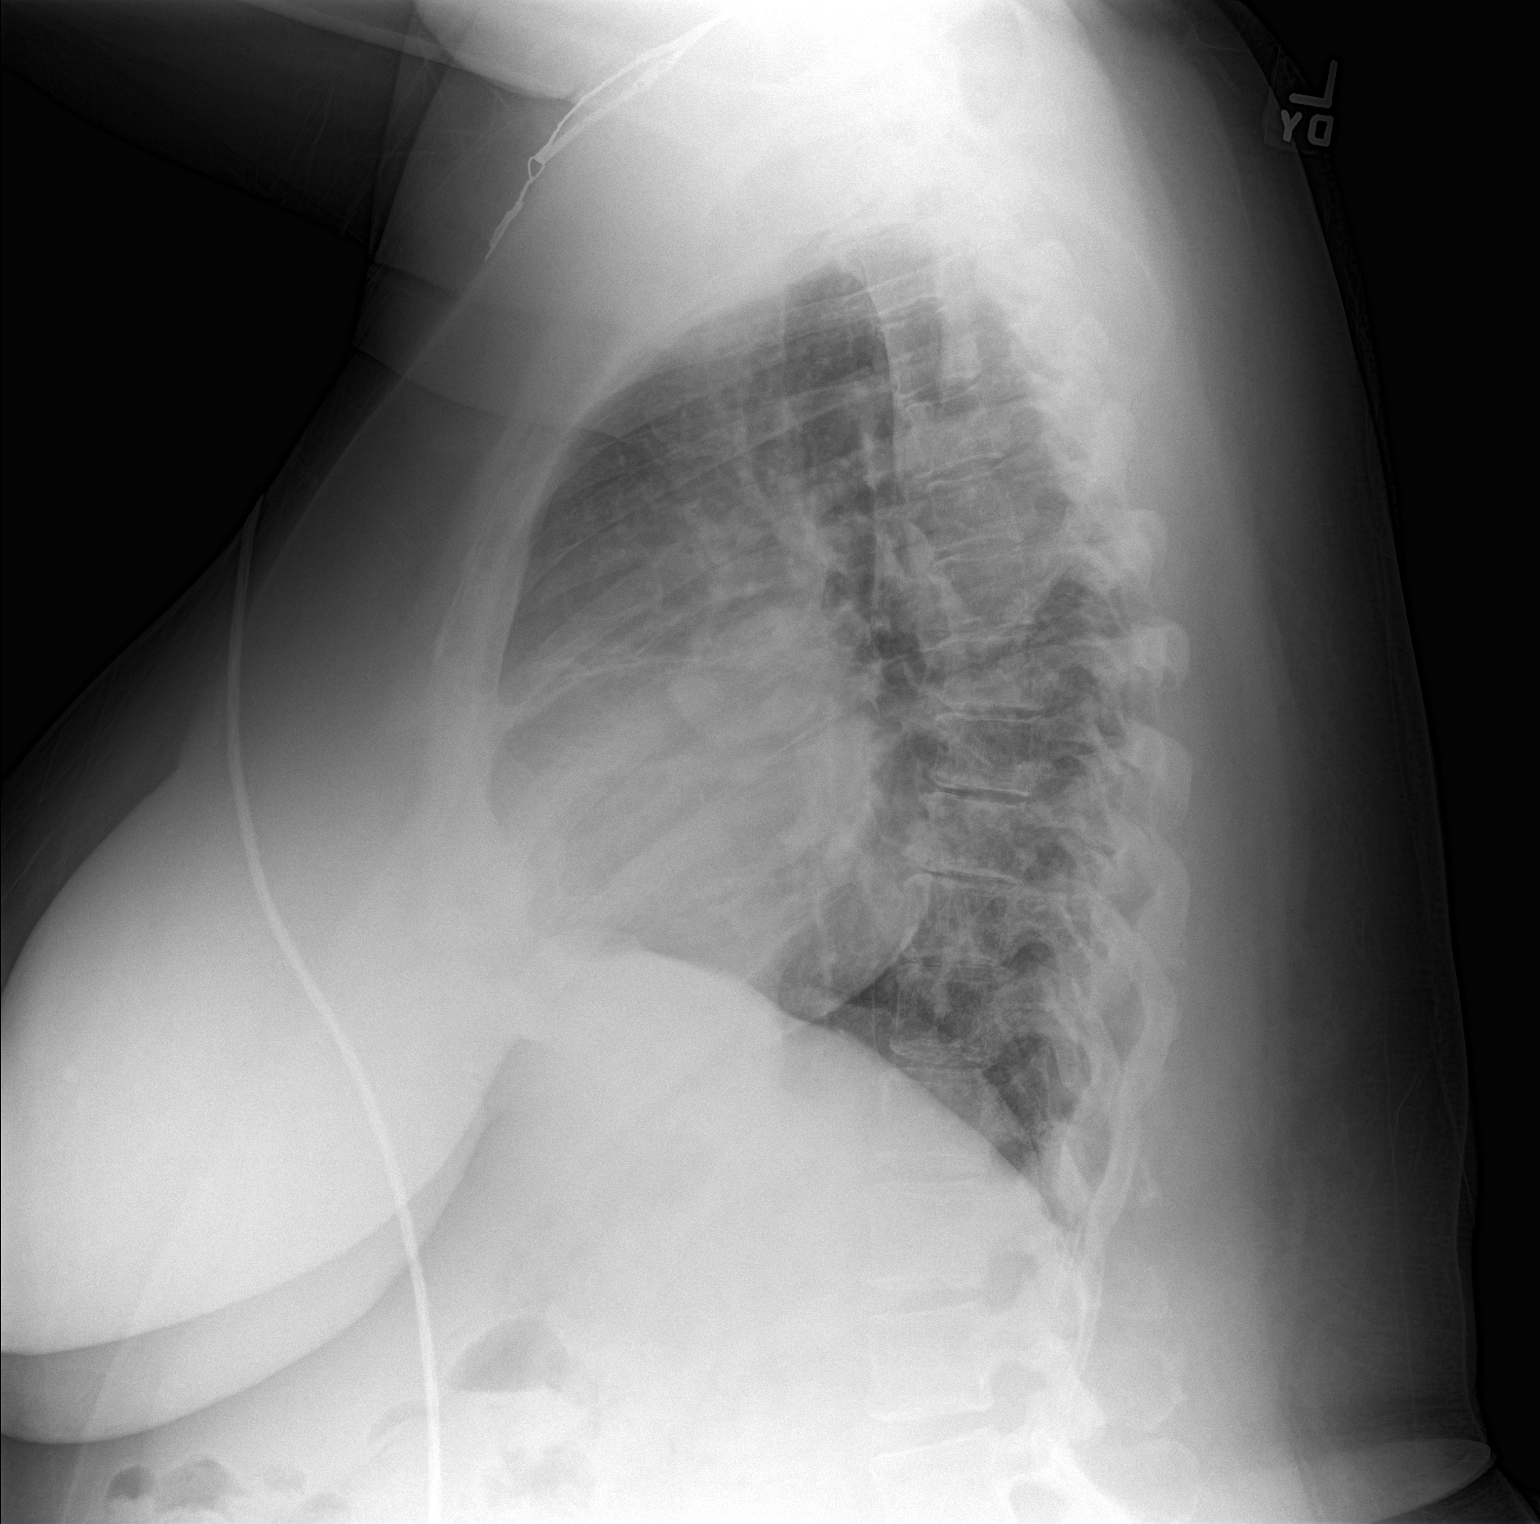

[2 of 2 positions shown; findings below may reference images not displayed]

FINDINGS: Mild cardiomegaly. Mild vascular congestion. No overt edema. No
confluent opacities or effusions. No acute bony abnormality.
IMPRESSION: Cardiomegaly, vascular congestion.

## 2014-12-06 MED ORDER — SODIUM CHLORIDE 0.9 % IV SOLN
Freq: Once | INTRAVENOUS | Status: AC
  Administered 2014-12-06: 14:00:00 via INTRAVENOUS

## 2014-12-06 MED ORDER — INSULIN ASPART 100 UNIT/ML ~~LOC~~ SOLN
10.0000 [IU] | Freq: Once | SUBCUTANEOUS | Status: AC
  Administered 2014-12-06: 10 [IU] via SUBCUTANEOUS
  Filled 2014-12-06: qty 10

## 2014-12-06 MED ORDER — INSULIN REGULAR HUMAN 100 UNIT/ML IJ SOLN: INTRAMUSCULAR | Status: DC

## 2014-12-06 NOTE — ED Provider Notes (Signed)
[facility] Emergency Department Provider Note     Time seen: ----------------------------------------- 1:47 PM on 12/06/2014 -----------------------------------------    I have reviewed the triage vital signs and the nursing notes.   HISTORY  Chief Complaint Hyperglycemia    HPI Teresa [name] is a 54 y.o. female who presents to ER for dizziness and high blood sugars. She states that she went to her doctor 2 weeks ago she was taken off of metformin because her pancreas was irritated. She states since that period time her blood sugars up and elevated and she felt weak and lightheaded. At sugars as high as 400s, she's also had a little cough and congestion. Main complaint is feeling weak and fatigued.   Past Medical History  Diagnosis Date  . Diabetes   . COPD (chronic obstructive pulmonary disease)   . Sleep apnea   . Hypertension   . Pancreatitis     There are no active problems to display for this patient.   Past Surgical History  Procedure Laterality Date  . Cardiac surgery    . Wrist surgery Bilateral   . Cyst excision      Allergies Amoxicillin; Penicillins; and Shellfish allergy  Social History Social History  Substance Use Topics  . Smoking status: Current Some Day Smoker    Types: Cigarettes  . Smokeless tobacco: None  . Alcohol Use: No    Review of Systems Constitutional: Negative for fever. Eyes: Negative for visual changes. ENT: Negative for sore throat. Cardiovascular: Negative for chest pain. Respiratory: Negative for shortness of breath. Positive for cough Gastrointestinal: Negative for abdominal pain, vomiting and diarrhea. Genitourinary: Negative for dysuria. Musculoskeletal: Negative for back pain. Skin: Negative for rash. Neurological: Negative for headaches, positive for weakness   10-point ROS otherwise negative.  ____________________________________________   PHYSICAL EXAM:  VITAL SIGNS: ED Triage  Vitals  Enc Vitals Group     BP 12/06/14 1326 104/57 mmHg     Pulse Rate 12/06/14 1326 78     Resp 12/06/14 1326 20     Temp 12/06/14 1326 98.3 F (36.8 C)     Temp Source 12/06/14 1326 Oral     SpO2 12/06/14 1326 96 %     Weight 12/06/14 1326 240 lb (108.863 kg)     Height 12/06/14 1326  (1.575 m)     Head Cir --      Peak Flow --      Pain Score 12/06/14 1327 7     Pain Loc --      Pain Edu? --      Excl. in GC? --     Constitutional: Alert and oriented. Well appearing and in no distress. Obese Eyes: Conjunctivae are normal. PERRL. Normal extraocular movements. ENT   Head: Normocephalic and atraumatic.   Nose: No congestion/rhinnorhea.   Mouth/Throat: Mucous membranes are moist.   Neck: No stridor. Cardiovascular: Normal rate, regular rhythm. Normal and symmetric distal pulses are present in all extremities. No murmurs, rubs, or gallops. Respiratory: Normal respiratory effort without tachypnea nor retractions. Breath sounds are clear and equal bilaterally. No wheezes/rales/rhonchi. Gastrointestinal: Soft and nontender. No distention. No abdominal bruits.  Musculoskeletal: Nontender with normal range of motion in all extremities. No joint effusions.  No lower extremity tenderness nor edema. Neurologic:  Normal speech and language. No gross focal neurologic deficits are appreciated. Speech is normal. No gait instability. Skin:  Skin is warm, dry and intact. No rash noted. Psychiatric: Mood and affect are normal. Speech  and behavior are normal. Patient exhibits appropriate insight and judgment. ____________________________________________  ED COURSE:  Pertinent labs & imaging results that were available during my care of the patient were reviewed by me and considered in my medical decision making (see chart for details). Patient is in no acute distress, likely feeling the effects of days of hyperglycemia and is probably mildly dehydrated. Will need fluids here  and reevaluation of her labs ____________________________________________    LABS (pertinent positives/negatives)  Labs Reviewed  BASIC METABOLIC PANEL - Abnormal; Notable for the following:    Chloride 98 (*)    Glucose, Bld 394 (*)    Creatinine, Ser 1.17 (*)    GFR calc non Af Amer 52 (*)    All other components within normal limits  CBC - Abnormal; Notable for the following:    RBC 3.76 (*)    Hemoglobin 11.0 (*)    HCT 34.2 (*)    RDW 15.2 (*)    All other components within normal limits  URINALYSIS COMPLETEWITH MICROSCOPIC ([facility] ONLY) - Abnormal; Notable for the following:    Color, Urine YELLOW (*)    APPearance HAZY (*)    Glucose, UA >500 (*)    Bacteria, UA RARE (*)    Squamous Epithelial / LPF 0-5 (*)    All other components within normal limits  GLUCOSE, CAPILLARY - Abnormal; Notable for the following:    Glucose-Capillary 386 (*)    All other components within normal limits  TROPONIN I  CBG MONITORING, ED   ____________________________________________  FINAL ASSESSMENT AND PLAN  Dizziness, hyperglycemia  Plan: Patient with labs as dictated above. Patient to receive saline here, currently stable. Her blood sugar is improving. She does need to be on a sliding scale insulin in addition to Lantus. She'll be discharged with short acting insulin sliding scale and encouraged to have close follow-up with her doctor.   Emily Filbert, MD   Emily Filbert, MD 12/06/14 [phone]

## 2014-12-06 NOTE — ED Notes (Signed)
POC blood glucose- 386

## 2014-12-06 NOTE — ED Notes (Signed)
Returned from XR 

## 2014-12-06 NOTE — ED Notes (Signed)
Patient transported to X-ray 

## 2014-12-06 NOTE — ED Notes (Signed)
Pt to triage with reports that she went to her pcp 2 weeks ago and was taken off of her metformin d/t her pancreas. Pt reports that her pcp placed her on insulin and since then pt has been having difficulty keeping sugars under control. Pt states that she took her sugar around 1230 and it was 400. Pt also concerned that she has cough and congestion.

## 2014-12-06 NOTE — Discharge Instructions (Signed)
Dizziness °Dizziness is a common problem. It is a feeling of unsteadiness or light-headedness. You may feel like you are about to faint. Dizziness can lead to injury if you stumble or fall. A person of any age group can suffer from dizziness, but dizziness is more common in older adults. °CAUSES  °Dizziness can be caused by many different things, including: °· Middle ear problems. °· Standing for too long. °· Infections. °· An allergic reaction. °· Aging. °· An emotional response to something, such as the sight of blood. °· Side effects of medicines. °· Tiredness. °· Problems with circulation or blood pressure. °· Excessive use of alcohol or medicines, or illegal drug use. °· Breathing too fast (hyperventilation). °· An irregular heart rhythm (arrhythmia). °· A low red blood cell count (anemia). °· Pregnancy. °· Vomiting, diarrhea, fever, or other illnesses that cause body fluid loss (dehydration). °· Diseases or conditions such as Parkinson's disease, high blood pressure (hypertension), diabetes, and thyroid problems. °· Exposure to extreme heat. °DIAGNOSIS  °Your health care provider will ask about your symptoms, perform a physical exam, and perform an electrocardiogram (ECG) to record the electrical activity of your heart. Your health care provider may also perform other heart or blood tests to determine the cause of your dizziness. These may include: °· Transthoracic echocardiogram (TTE). During echocardiography, sound waves are used to evaluate how blood flows through your heart. °· Transesophageal echocardiogram (TEE). °· Cardiac monitoring. This allows your health care provider to monitor your heart rate and rhythm in real time. °· Holter monitor. This is a portable device that records your heartbeat and can help diagnose heart arrhythmias. It allows your health care provider to track your heart activity for several days if needed. °· Stress tests by exercise or by giving medicine that makes the heart beat  faster. °TREATMENT  °Treatment of dizziness depends on the cause of your symptoms and can vary greatly. °HOME CARE INSTRUCTIONS  °· Drink enough fluids to keep your urine clear or pale yellow. This is especially important in very hot weather. In older adults, it is also important in cold weather. °· Take your medicine exactly as directed if your dizziness is caused by medicines. When taking blood pressure medicines, it is especially important to get up slowly. °· Rise slowly from chairs and steady yourself until you feel okay. °· In the morning, first sit up on the side of the bed. When you feel okay, stand slowly while holding onto something until you know your balance is fine. °· Move your legs often if you need to stand in one place for a long time. Tighten and relax your muscles in your legs while standing. °· Have someone stay with you for 1-2 days if dizziness continues to be a problem. Do this until you feel you are well enough to stay alone. Have the person call your health care provider if he or she notices changes in you that are concerning. °· Do not drive or use heavy machinery if you feel dizzy. °· Do not drink alcohol. °SEEK IMMEDIATE MEDICAL CARE IF:  °· Your dizziness or light-headedness gets worse. °· You feel nauseous or vomit. °· You have problems talking, walking, or using your arms, hands, or legs. °· You feel weak. °· You are not thinking clearly or you have trouble forming sentences. It may take a friend or family member to notice this. °· You have chest pain, abdominal pain, shortness of breath, or sweating. °· Your vision changes. °· You notice   any bleeding.  You have side effects from medicine that seems to be getting worse rather than better. MAKE SURE YOU:   Understand these instructions.  Will watch your condition.  Will get help right away if you are not doing well or get worse. Document Released: 09/21/2000 Document Revised: 04/02/2013 Document Reviewed: 10/15/2010 [facility]  Patient Information 2015 [place], [place]. This information is not intended to replace advice given to you by your health care provider. Make sure you discuss any questions you have with your health care provider.  Hyperglycemia Hyperglycemia occurs when the glucose (sugar) in your blood is too high. Hyperglycemia can happen for many reasons, but it most often happens to people who do not know they have diabetes or are not managing their diabetes properly.  CAUSES  Whether you have diabetes or not, there are other causes of hyperglycemia. Hyperglycemia can occur when you have diabetes, but it can also occur in other situations that you might not be as aware of, such as: Diabetes  If you have diabetes and are having problems controlling your blood glucose, hyperglycemia could occur because of some of the following reasons:  Not following your meal plan.  Not taking your diabetes medications or not taking it properly.  Exercising less or doing less activity than you normally do.  Being sick. Pre-diabetes  This cannot be ignored. Before people develop Type 2 diabetes, they almost always have "pre-diabetes." This is when your blood glucose levels are higher than normal, but not yet high enough to be diagnosed as diabetes. Research has shown that some long-term damage to the body, especially the heart and circulatory system, may already be occurring during pre-diabetes. If you take action to manage your blood glucose when you have pre-diabetes, you may delay or prevent Type 2 diabetes from developing. Stress  If you have diabetes, you may be "diet" controlled or on oral medications or insulin to control your diabetes. However, you may find that your blood glucose is higher than usual in the hospital whether you have diabetes or not. This is often referred to as "stress hyperglycemia." Stress can elevate your blood glucose. This happens because of hormones put out by the body during times of stress. If  stress has been the cause of your high blood glucose, it can be followed regularly by your caregiver. That way he/she can make sure your hyperglycemia does not continue to get worse or progress to diabetes. Steroids  Steroids are medications that act on the infection fighting system (immune system) to block inflammation or infection. One side effect can be a rise in blood glucose. Most people can produce enough extra insulin to allow for this rise, but for those who cannot, steroids make blood glucose levels go even higher. It is not unusual for steroid treatments to "uncover" diabetes that is developing. It is not always possible to determine if the hyperglycemia will go away after the steroids are stopped. A special blood test called an A1c is sometimes done to determine if your blood glucose was elevated before the steroids were started. SYMPTOMS  Thirsty.  Frequent urination.  Dry mouth.  Blurred vision.  Tired or fatigue.  Weakness.  Sleepy.  Tingling in feet or leg. DIAGNOSIS  Diagnosis is made by monitoring blood glucose in one or all of the following ways:  A1c test. This is a chemical found in your blood.  Fingerstick blood glucose monitoring.  Laboratory results. TREATMENT  First, knowing the cause of the hyperglycemia is important before  the hyperglycemia can be treated. Treatment may include, but is not be limited to:  Education.  Change or adjustment in medications.  Change or adjustment in meal plan.  Treatment for an illness, infection, etc.  More frequent blood glucose monitoring.  Change in exercise plan.  Decreasing or stopping steroids.  Lifestyle changes. HOME CARE INSTRUCTIONS   Test your blood glucose as directed.  Exercise regularly. Your caregiver will give you instructions about exercise. Pre-diabetes or diabetes which comes on with stress is helped by exercising.  Eat wholesome, balanced meals. Eat often and at regular, fixed times. Your  caregiver or nutritionist will give you a meal plan to guide your sugar intake.  Being at an ideal weight is important. If needed, losing as little as 10 to 15 pounds may help improve blood glucose levels. SEEK MEDICAL CARE IF:   You have questions about medicine, activity, or diet.  You continue to have symptoms (problems such as increased thirst, urination, or weight gain). SEEK IMMEDIATE MEDICAL CARE IF:   You are vomiting or have diarrhea.  Your breath smells fruity.  You are breathing faster or slower.  You are very sleepy or incoherent.  You have numbness, tingling, or pain in your feet or hands.  You have chest pain.  Your symptoms get worse even though you have been following your caregiver's orders.  If you have any other questions or concerns. Document Released: 09/21/2000 Document Revised: 06/20/2011 Document Reviewed: 07/25/2011 [facility] Patient Information 2015 [place], [place]. This information is not intended to replace advice given to you by your health care provider. Make sure you discuss any questions you have with your health care provider.

## 2014-12-13 LAB — GLUCOSE, CAPILLARY: GLUCOSE-CAPILLARY: 327 mg/dL — AB (ref 65–99)

## 2015-01-01 ENCOUNTER — Encounter: Payer: Self-pay | Admitting: *Deleted

## 2015-01-01 ENCOUNTER — Observation Stay: Payer: Medicare Other

## 2015-01-01 ENCOUNTER — Emergency Department: Payer: Medicare Other

## 2015-01-01 ENCOUNTER — Observation Stay
Admission: EM | Admit: 2015-01-01 | Discharge: 2015-01-02 | Disposition: A | Discharge status: Home / Self Care | Payer: Medicare Other | Attending: Internal Medicine | Admitting: Internal Medicine

## 2015-01-01 DIAGNOSIS — K859 Acute pancreatitis, unspecified: Secondary | ICD-10-CM | POA: Diagnosis not present

## 2015-01-01 DIAGNOSIS — G4730 Sleep apnea, unspecified: Secondary | ICD-10-CM | POA: Diagnosis not present

## 2015-01-01 DIAGNOSIS — Z794 Long term (current) use of insulin: Secondary | ICD-10-CM | POA: Insufficient documentation

## 2015-01-01 DIAGNOSIS — Z91013 Allergy to seafood: Secondary | ICD-10-CM | POA: Insufficient documentation

## 2015-01-01 DIAGNOSIS — Q210 Ventricular septal defect: Secondary | ICD-10-CM | POA: Insufficient documentation

## 2015-01-01 DIAGNOSIS — R4781 Slurred speech: Secondary | ICD-10-CM | POA: Insufficient documentation

## 2015-01-01 DIAGNOSIS — E785 Hyperlipidemia, unspecified: Secondary | ICD-10-CM | POA: Insufficient documentation

## 2015-01-01 DIAGNOSIS — Z79899 Other long term (current) drug therapy: Secondary | ICD-10-CM | POA: Diagnosis not present

## 2015-01-01 DIAGNOSIS — Z7982 Long term (current) use of aspirin: Secondary | ICD-10-CM | POA: Diagnosis not present

## 2015-01-01 DIAGNOSIS — I639 Cerebral infarction, unspecified: Secondary | ICD-10-CM | POA: Diagnosis present

## 2015-01-01 DIAGNOSIS — Z881 Allergy status to other antibiotic agents status: Secondary | ICD-10-CM | POA: Insufficient documentation

## 2015-01-01 DIAGNOSIS — E1165 Type 2 diabetes mellitus with hyperglycemia: Secondary | ICD-10-CM | POA: Insufficient documentation

## 2015-01-01 DIAGNOSIS — J449 Chronic obstructive pulmonary disease, unspecified: Secondary | ICD-10-CM | POA: Insufficient documentation

## 2015-01-01 DIAGNOSIS — F17210 Nicotine dependence, cigarettes, uncomplicated: Secondary | ICD-10-CM | POA: Diagnosis not present

## 2015-01-01 DIAGNOSIS — Z880 Allergy status to penicillin: Secondary | ICD-10-CM | POA: Insufficient documentation

## 2015-01-01 DIAGNOSIS — I10 Essential (primary) hypertension: Secondary | ICD-10-CM | POA: Insufficient documentation

## 2015-01-01 DIAGNOSIS — R531 Weakness: Secondary | ICD-10-CM | POA: Insufficient documentation

## 2015-01-01 DIAGNOSIS — Z7951 Long term (current) use of inhaled steroids: Secondary | ICD-10-CM | POA: Diagnosis not present

## 2015-01-01 DIAGNOSIS — Z6841 Body Mass Index (BMI) 40.0 and over, adult: Secondary | ICD-10-CM | POA: Diagnosis not present

## 2015-01-01 DIAGNOSIS — G459 Transient cerebral ischemic attack, unspecified: Secondary | ICD-10-CM | POA: Diagnosis present

## 2015-01-01 LAB — BASIC METABOLIC PANEL
Anion gap: 9 (ref 5–15)
BUN: 17 mg/dL (ref 6–20)
CO2: 30 mmol/L (ref 22–32)
CREATININE: 1.06 mg/dL — AB (ref 0.44–1.00)
Calcium: 9.2 mg/dL (ref 8.9–10.3)
Chloride: 100 mmol/L — ABNORMAL LOW (ref 101–111)
GFR calc Af Amer: >60 mL/min (ref >60)
GFR, EST NON AFRICAN AMERICAN: 58 mL/min — AB (ref >60)
GLUCOSE: 198 mg/dL — AB (ref 65–99)
Potassium: 3.7 mmol/L (ref 3.5–5.1)
SODIUM: 139 mmol/L (ref 135–145)

## 2015-01-01 LAB — URINALYSIS COMPLETE WITH MICROSCOPIC (ARMC ONLY)
BILIRUBIN URINE: NEGATIVE
GLUCOSE, UA: NEGATIVE mg/dL
HGB URINE DIPSTICK: NEGATIVE
Ketones, ur: NEGATIVE mg/dL
LEUKOCYTES UA: NEGATIVE
NITRITE: NEGATIVE
Protein, ur: NEGATIVE mg/dL
SPECIFIC GRAVITY, URINE: 1.01 (ref 1.005–1.030)
pH: 6 (ref 5.0–8.0)

## 2015-01-01 LAB — GLUCOSE, CAPILLARY
GLUCOSE-CAPILLARY: 209 mg/dL — AB (ref 65–99)
Glucose-Capillary: 131 mg/dL — ABNORMAL HIGH (ref 65–99)
Glucose-Capillary: 201 mg/dL — ABNORMAL HIGH (ref 65–99)

## 2015-01-01 LAB — CBC
HCT: 34.4 % — ABNORMAL LOW (ref 35.0–47.0)
Hemoglobin: 11.3 g/dL — ABNORMAL LOW (ref 12.0–16.0)
MCH: 30.1 pg (ref 26.0–34.0)
MCHC: 32.7 g/dL (ref 32.0–36.0)
MCV: 91.9 fL (ref 80.0–100.0)
PLATELETS: 292 10*3/uL (ref 150–440)
RBC: 3.75 MIL/uL — ABNORMAL LOW (ref 3.80–5.20)
RDW: 14.1 % (ref 11.5–14.5)
WBC: 9.5 10*3/uL (ref 3.6–11.0)

## 2015-01-01 IMAGING — CT CT HEAD W/O CM
1 series · 16 of 30 positions shown, 20 images · non-contrast
Comparison: CT scan of [DATE].

CLINICAL DATA: Slurred speech.

EXAM:
CT HEAD WITHOUT CONTRAST
TECHNIQUE: Contiguous axial images were obtained from the base of the skull
through the vertex without intravenous contrast.

[Series 2: head wo · axial · 0.41mm/px · z∈[-112,+22]mm · 16 of 31 slices shown, 20 images]
[im 2/31  brain]
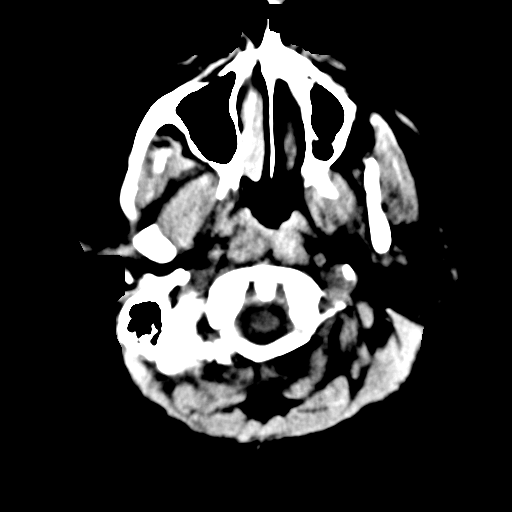
[im 2/31  bone]
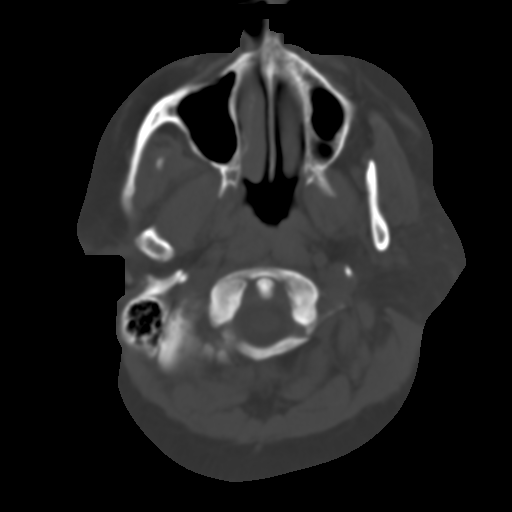
[im 4/31  brain]
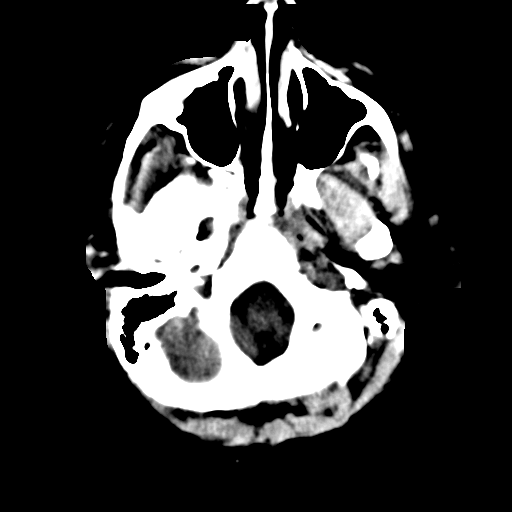
[im 6/31  brain]
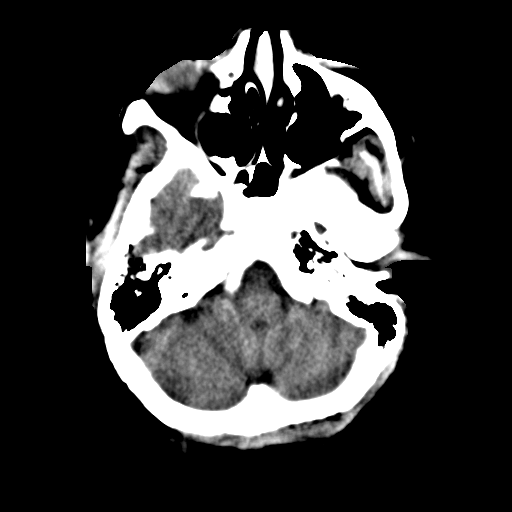
[im 8/31  brain]
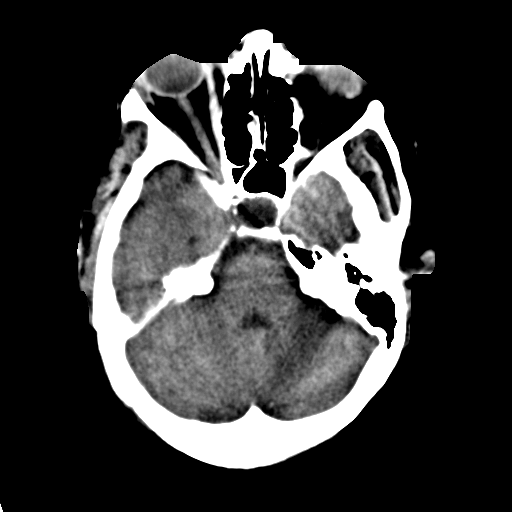
[im 9/31  brain]
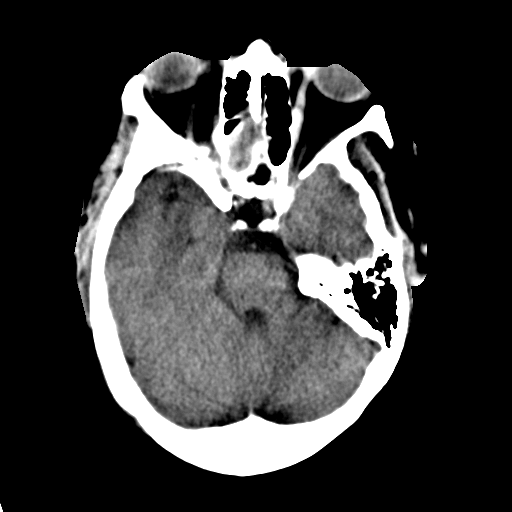
[im 9/31  bone]
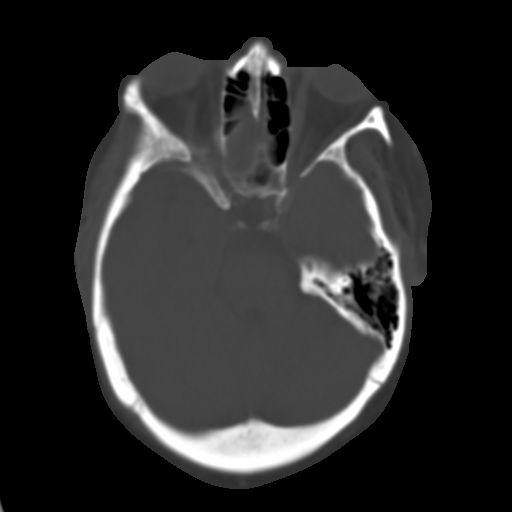
[im 11/31  brain]
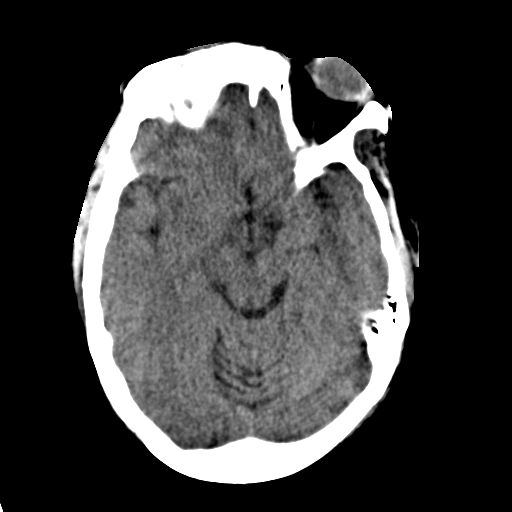
[im 13/31  brain]
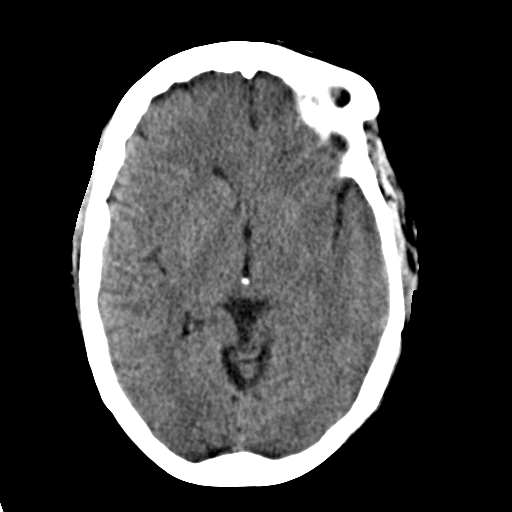
[im 15/31  brain]
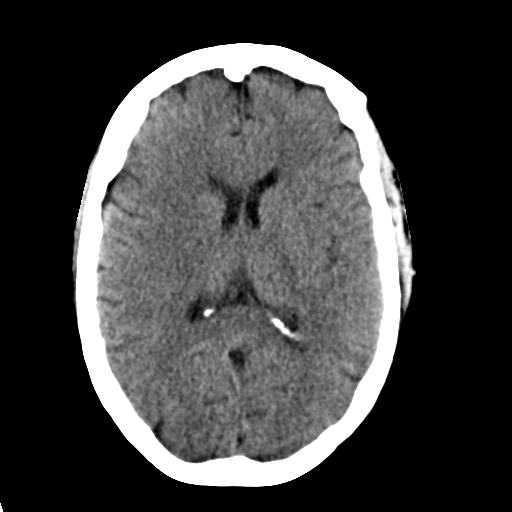
[im 16/31  brain]
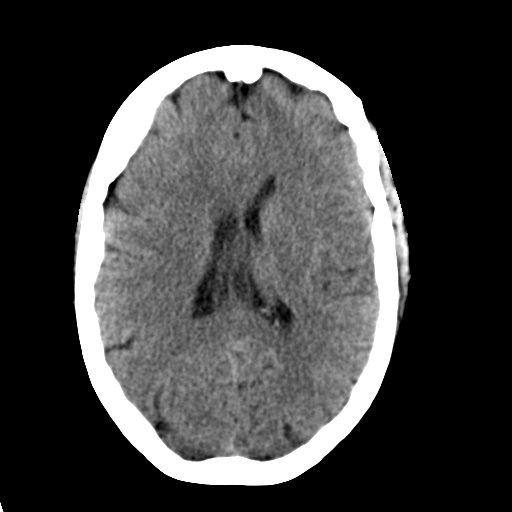
[im 16/31  bone]
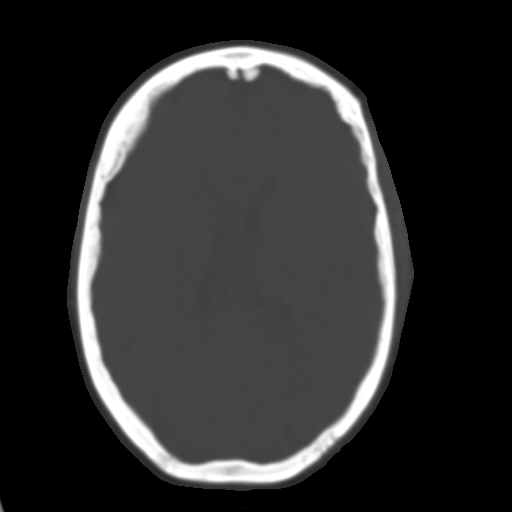
[im 18/31  brain]
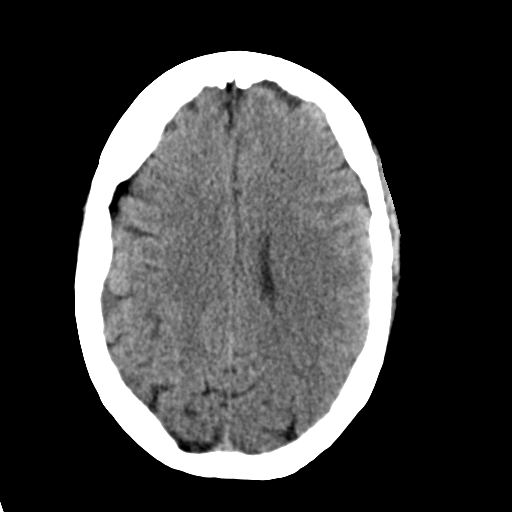
[im 20/31  brain]
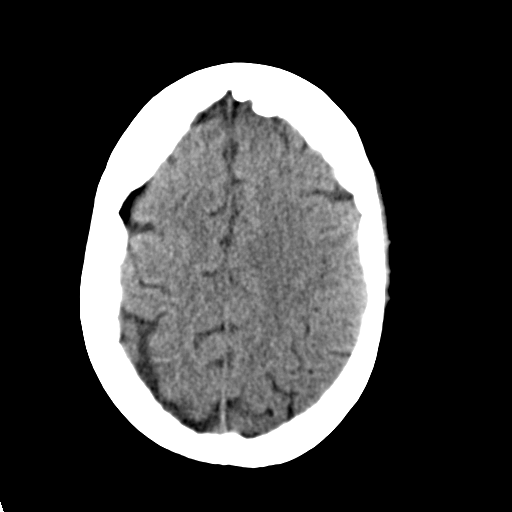
[im 22/31  brain]
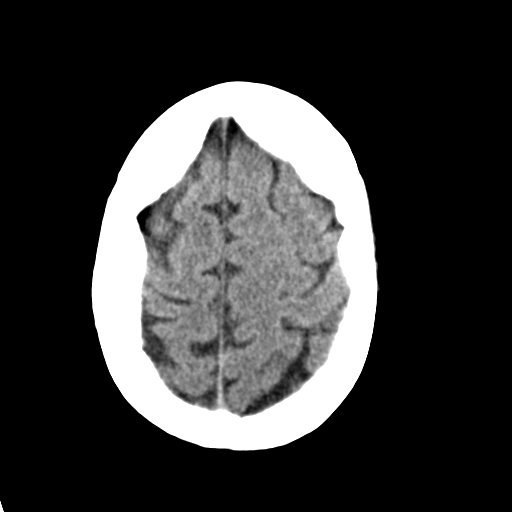
[im 23/31  brain]
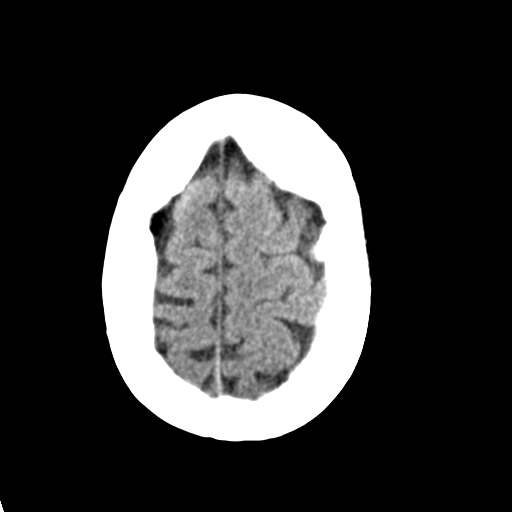
[im 23/31  bone]
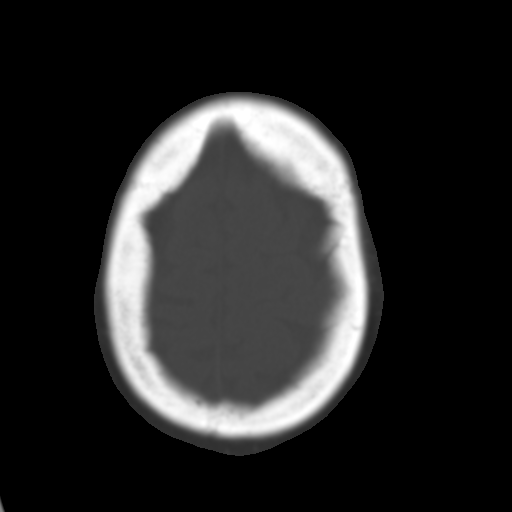
[im 25/31  brain]
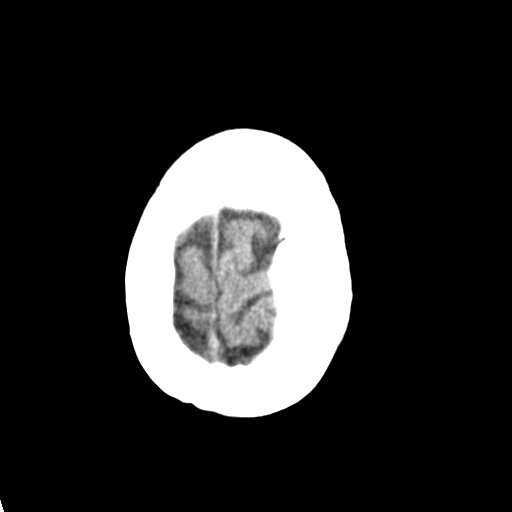
[im 27/31  brain]
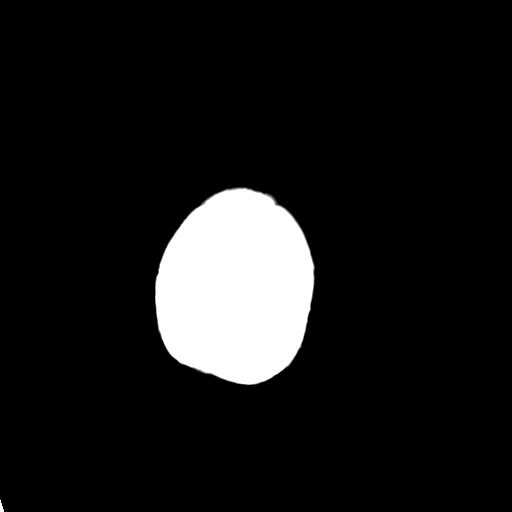
[im 29/31  brain]
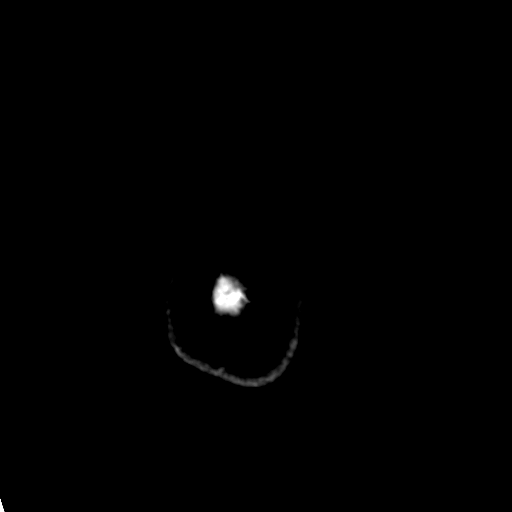

[16 of 30 positions shown; findings below may reference images not displayed]

FINDINGS: Bony calvarium appears intact. No mass effect or midline shift is
noted. Ventricular size is within normal limits. There is no
evidence of mass lesion, hemorrhage or acute infarction.
IMPRESSION: No acute intracranial abnormality seen.

## 2015-01-01 MED ORDER — NON FORMULARY: 20.0000 mg | Freq: Every day | Status: DC

## 2015-01-01 MED ORDER — INSULIN ASPART 100 UNIT/ML ~~LOC~~ SOLN
0.0000 [IU] | Freq: Three times a day (TID) | SUBCUTANEOUS | Status: DC
  Administered 2015-01-01: 3 [IU] via SUBCUTANEOUS
  Administered 2015-01-02: 5 [IU] via SUBCUTANEOUS
  Administered 2015-01-02: 3 [IU] via SUBCUTANEOUS
  Administered 2015-01-02: 12:00:00 5 [IU] via SUBCUTANEOUS
  Filled 2015-01-01: qty 5
  Filled 2015-01-01 (×2): qty 3
  Filled 2015-01-01: qty 5

## 2015-01-01 MED ORDER — AMLODIPINE BESYLATE 5 MG PO TABS
5.0000 mg | ORAL_TABLET | Freq: Every day | ORAL | Status: DC
  Administered 2015-01-01 – 2015-01-02 (×2): 5 mg via ORAL
  Filled 2015-01-01 (×2): qty 1

## 2015-01-01 MED ORDER — ASPIRIN EC 81 MG PO TBEC
81.0000 mg | DELAYED_RELEASE_TABLET | Freq: Every day | ORAL | Status: DC
  Administered 2015-01-02: 11:00:00 81 mg via ORAL
  Filled 2015-01-01: qty 1

## 2015-01-01 MED ORDER — ENOXAPARIN SODIUM 40 MG/0.4ML ~~LOC~~ SOLN
40.0000 mg | Freq: Two times a day (BID) | SUBCUTANEOUS | Status: DC
  Administered 2015-01-01 – 2015-01-02 (×2): 40 mg via SUBCUTANEOUS
  Filled 2015-01-01 (×2): qty 0.4

## 2015-01-01 MED ORDER — ASPIRIN 81 MG PO CHEW
324.0000 mg | CHEWABLE_TABLET | Freq: Once | ORAL | Status: AC
  Administered 2015-01-01: 324 mg via ORAL
  Filled 2015-01-01: qty 4

## 2015-01-01 MED ORDER — SIMVASTATIN 20 MG PO TABS
20.0000 mg | ORAL_TABLET | Freq: Every day | ORAL | Status: DC
  Administered 2015-01-02: 20 mg via ORAL
  Filled 2015-01-01: qty 1

## 2015-01-01 MED ORDER — BUDESONIDE-FORMOTEROL FUMARATE 80-4.5 MCG/ACT IN AERO
2.0000 | INHALATION_SPRAY | Freq: Every day | RESPIRATORY_TRACT | Status: DC
  Administered 2015-01-02: 2 via RESPIRATORY_TRACT
  Filled 2015-01-01: qty 6.9

## 2015-01-01 NOTE — ED Notes (Signed)
Patient states she was at [facility] and had a BS of 328 and was sent here by EMS.

## 2015-01-01 NOTE — ED Notes (Signed)
CBG 154. 

## 2015-01-01 NOTE — ED Notes (Signed)
Patient still in [place].

## 2015-01-01 NOTE — ED Provider Notes (Signed)
[facility] Emergency Department Provider Note  ____________________________________________  Time seen: Approximately 3:55 PM  I have reviewed the triage vital signs and the nursing notes.   HISTORY  Chief Complaint Hyperglycemia    HPI Teresa [name] is a 54 y.o. female patient reports she was called by [facility] to come to visit them. Patient got in her car and started driving and noticed when she got to [place] [facility] she was having trouble walking and her speech she thought was somewhat slurry. That was at 1045 this morning. Patient sent here from [place] [facility] with a history of present illness saying confusion can't remember birthdate stumbling off balance and sugar this morning was reported be 199 at the time she didn't have any difficulty speaking or have any facial droop. Patient came to the emergency room with persistent presumed diagnosis of low blood sugar. When she got here she was not only confused she was having a lot of slurry speech almost unintelligible. She went to CT scan and came to the major side room 25. Once here she was awake alert not confused able to tell me her complete history she did hesitate when I asked her what her chest scar was from. 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 Patient had clear speech nonfocal neuro exam but she was unable to walk.  Past Medical History  Diagnosis Date  . Diabetes   . COPD (chronic obstructive pulmonary disease)   . Sleep apnea   . Hypertension   . Pancreatitis     There are no active problems to display for this patient.   Past Surgical History  Procedure Laterality Date  . Cardiac surgery    . Wrist surgery Bilateral   . Cyst excision      Current Outpatient Rx  Name  Route  Sig  Dispense  Refill  . ALPRAZolam (XANAX PO)   Oral   Take by mouth.         Marland Kitchen amLODipine (NORVASC) 5 MG tablet   Oral   Take 5 mg by mouth daily.         Marland Kitchen aspirin  81 MG tablet   Oral   Take 81 mg by mouth daily.         . ATORVASTATIN CALCIUM PO   Oral   Take by mouth daily.         . Budesonide-Formoterol Fumarate (SYMBICORT IN)   Inhalation   Inhale into the lungs daily.         . ENALAPRIL MALEATE PO   Oral   Take by mouth daily.         Marland Kitchen FAMOTIDINE PO   Oral   Take by mouth daily.         Marland Kitchen FLUoxetine HCl (PROZAC PO)   Oral   Take by mouth daily.         Marland Kitchen GABAPENTIN PO   Oral   Take by mouth daily.         . Insulin Glargine (LANTUS [place])   Subcutaneous   Inject into the skin daily.         . insulin regular (NOVOLIN R,HUMULIN R) 100 units/mL injection      For use three times daily with sliding scale-150-199 = 2 units, 200-249= 4 units, 250-299=6 units, 300-349= 8 units, 350-399=10 units   100 mL   3   . METFORMIN HCL PO  Oral   Take by mouth daily.         Marland Kitchen METOPROLOL TARTRATE PO   Oral   Take by mouth daily.         . Oxycodone-Acetaminophen (PERCOCET PO)   Oral   Take by mouth daily.         . Tiotropium Bromide Monohydrate (SPIRIVA HANDIHALER IN)   Inhalation   Inhale into the lungs daily.         . TRAZODONE HCL PO   Oral   Take by mouth daily.           Allergies Amoxicillin; Penicillins; and Shellfish allergy  No family history on file.  Social History Social History  Substance Use Topics  . Smoking status: Current Some Day Smoker    Types: Cigarettes  . Smokeless tobacco: None  . Alcohol Use: No    Review of Systems Constitutional: No fever/chills Eyes: No visual changes. ENT: No sore throat. Cardiovascular: Denies chest pain. Respiratory: Denies shortness of breath. Gastrointestinal: No abdominal pain.  No nausea, no vomiting.  No diarrhea.  No constipation. Genitourinary: Negative for dysuria. Musculoskeletal: Negative for back pain. Skin: Negative for rash. Neurological: Negative for headaches, focal weakness or numbness.  10-point ROS otherwise  negative.  ____________________________________________   PHYSICAL EXAM:  VITAL SIGNS: ED Triage Vitals  Enc Vitals Group     BP 01/01/15 1319 121/65 mmHg     Pulse Rate 01/01/15 1319 76     Resp 01/01/15 1319 20     Temp 01/01/15 1319 98.4 F (36.9 C)     Temp Source 01/01/15 1319 Oral     SpO2 01/01/15 1319 96 %     Weight 01/01/15 1319 240 lb (108.863 kg)     Height 01/01/15 1319  (1.575 m)     Head Cir --      Peak Flow --      Pain Score 01/01/15 1420 1     Pain Loc --      Pain Edu? --      Excl. in GC? --     Constitutional: Alert and oriented. Well appearing and in no acute distress. Eyes: Conjunctivae are normal. PERRL. EOMI. Head: Atraumatic. Nose: No congestion/rhinnorhea. Mouth/Throat: Mucous membranes are moist.  Oropharynx non-erythematous. Neck: No stridor.  Cardiovascular: Normal rate, regular rhythm. Grossly normal heart sounds.  Good peripheral circulation. Respiratory: Normal respiratory effort.  No retractions. Lungs CTAB. Gastrointestinal: Soft and nontender. No distention. No abdominal bruits. No CVA tenderness. Musculoskeletal: No lower extremity tenderness nor edema.  No joint effusions. Neurologic:  Normal speech and language. No gross focal neurologic deficits are appreciated. Cranial nerves II through XII are intact Skin:  Skin is warm, dry and intact. No rash noted. Psychiatric: Mood and affect are normal. Speech and behavior are normal.  ____________________________________________   LABS (all labs ordered are listed, but only abnormal results are displayed)  Labs Reviewed  GLUCOSE, CAPILLARY - Abnormal; Notable for the following:    Glucose-Capillary 209 (*)    All other components within normal limits  BASIC METABOLIC PANEL - Abnormal; Notable for the following:    Chloride 100 (*)    Glucose, Bld 198 (*)    Creatinine, Ser 1.06 (*)    GFR calc non Af Amer 58 (*)    All other components within normal limits  CBC - Abnormal;  Notable for the following:    RBC 3.75 (*)    Hemoglobin 11.3 (*)  HCT 34.4 (*)    All other components within normal limits  GLUCOSE, CAPILLARY - Abnormal; Notable for the following:    Glucose-Capillary 131 (*)    All other components within normal limits  URINALYSIS COMPLETEWITH MICROSCOPIC ([facility] ONLY)  CBG MONITORING, ED   ____________________________________________  EKG  EKG read and interpreted by me shows normal sinus rhythm at a rate of 73 normal axis nonspecific ST-T wave changes ____________________________________________  RADIOLOGY  CT reviewed by me read by the radiologist as no acute changes ____________________________________________   PROCEDURES  Patient stood up and tried to walk in the ER patient unable to take steps. About 5 minutes later patient was walked by the nurses to the mode in the room. Nurse's report at this time patient much improved still very unsteady but actually able to walk  ____________________________________________   INITIAL IMPRESSION / ASSESSMENT AND PLAN / ED COURSE  Pertinent labs & imaging results that were available during my care of the patient were reviewed by me and considered in my medical decision making (see chart for details).   ____________________________________________   FINAL CLINICAL IMPRESSION(S) / ED DIAGNOSES  Final diagnoses:  Transient cerebral ischemia, unspecified transient cerebral ischemia type      Arnaldo Natal, MD 01/01/15 [id number]

## 2015-01-01 NOTE — Progress Notes (Signed)
ANTICOAGULATION CONSULT NOTE - Initial Consult  Pharmacy Consult for Lovenox  Indication: VTE prophylaxis  Allergies  Allergen Reactions  . Amoxicillin Other (See Comments)    Body shakes  . Penicillins Other (See Comments)    Body shakes  . Shellfish Allergy Swelling    Patient Measurements: Height:  (157.5 cm) Weight: 240 lb (108.863 kg) IBW/kg (Calculated) : 50.1 Heparin Dosing Weight:   Vital Signs: Temp: 99 F (37.2 C) (09/22 1908) Temp Source: Oral (09/22 1908) BP: 137/71 mmHg (09/22 1908) Pulse Rate: 43 (09/22 1908)  Labs:  Recent Labs  01/01/15 1338  HGB 11.3*  HCT 34.4*  PLT 292  CREATININE 1.06*    Estimated Creatinine Clearance: 70.5 mL/min (by C-G formula based on Cr of 1.06).   Medical History: Past Medical History  Diagnosis Date  . Diabetes   . COPD (chronic obstructive pulmonary disease)   . Sleep apnea   . Hypertension   . Pancreatitis     Medications:  Facility-administered medications prior to admission  Medication Dose Route Frequency Provider Last Rate Last Dose  . triamcinolone acetonide (KENALOG) 10 MG/ML injection 10 mg  10 mg Other Once Vivi Barrack, DPM       Prescriptions prior to admission  Medication Sig Dispense Refill Last Dose  . ALPRAZolam (XANAX PO) Take by mouth.   Taking  . amLODipine (NORVASC) 5 MG tablet Take 5 mg by mouth daily.   Taking  . aspirin 81 MG tablet Take 81 mg by mouth daily.   Taking  . ATORVASTATIN CALCIUM PO Take by mouth daily.   Taking  . Budesonide-Formoterol Fumarate (SYMBICORT IN) Inhale into the lungs daily.   Taking  . ENALAPRIL MALEATE PO Take by mouth daily.   Taking  . FAMOTIDINE PO Take by mouth daily.   Taking  . FLUoxetine HCl (PROZAC PO) Take by mouth daily.   Taking  . GABAPENTIN PO Take by mouth daily.   Taking  . Insulin Glargine (LANTUS [place]) Inject into the skin daily.   Taking  . insulin regular (NOVOLIN R,HUMULIN R) 100 units/mL injection For use three times daily  with sliding scale-150-199 = 2 units, 200-249= 4 units, 250-299=6 units, 300-349= 8 units, 350-399=10 units 100 mL 3   . METFORMIN HCL PO Take by mouth daily.   Taking  . METOPROLOL TARTRATE PO Take by mouth daily.   Taking  . Oxycodone-Acetaminophen (PERCOCET PO) Take by mouth daily.   Taking  . Tiotropium Bromide Monohydrate (SPIRIVA HANDIHALER IN) Inhale into the lungs daily.   Taking  . TRAZODONE HCL PO Take by mouth daily.   Taking    Assessment: CrCl = 70.5 ml/min BMI = 44  Goal of Therapy:  DVT prophylaxis   Plan:  Lovenox 40 mg SQ Q24H originally ordered.  Will adjust dose to lovenox 40 mg SQ Q12H based on BMI > 40 .   Jakyrah Holladay D 01/01/2015,7:25 PM

## 2015-01-02 ENCOUNTER — Observation Stay
Admit: 2015-01-02 | Discharge: 2015-01-02 | Disposition: A | Discharge status: Home / Self Care | Payer: Medicare Other | Attending: Internal Medicine | Admitting: Internal Medicine

## 2015-01-02 DIAGNOSIS — G459 Transient cerebral ischemic attack, unspecified: Secondary | ICD-10-CM | POA: Diagnosis not present

## 2015-01-02 LAB — GLUCOSE, CAPILLARY
GLUCOSE-CAPILLARY: 154 mg/dL — AB (ref 65–99)
Glucose-Capillary: 211 mg/dL — ABNORMAL HIGH (ref 65–99)
Glucose-Capillary: 216 mg/dL — ABNORMAL HIGH (ref 65–99)
Glucose-Capillary: 187 mg/dL — ABNORMAL HIGH (ref 65–99)

## 2015-01-02 MED ORDER — BUDESONIDE-FORMOTEROL FUMARATE 80-4.5 MCG/ACT IN AERO: 2.0000 | INHALATION_SPRAY | Freq: Every day | RESPIRATORY_TRACT | Status: DC

## 2015-01-02 MED ORDER — ACETAMINOPHEN 325 MG PO TABS
650.0000 mg | ORAL_TABLET | Freq: Four times a day (QID) | ORAL | Status: DC | PRN
  Administered 2015-01-02: 15:00:00 650 mg via ORAL
  Filled 2015-01-02: qty 2

## 2015-01-02 MED ORDER — ASPIRIN 81 MG PO TABS: 81.0000 mg | ORAL_TABLET | Freq: Every day | ORAL | Status: AC

## 2015-01-02 MED ORDER — IPRATROPIUM-ALBUTEROL 0.5-2.5 (3) MG/3ML IN SOLN: 3.0000 mL | Freq: Four times a day (QID) | RESPIRATORY_TRACT | Status: DC | PRN

## 2015-01-02 NOTE — Discharge Summary (Signed)
[facility] [place] [facility]   PATIENT NAME: Teresa [name]    MR#:  [id number]  DATE OF BIRTH:  [birth year]  DATE OF ADMISSION:  01/01/2015 ADMITTING PHYSICIAN: Enedina Finner, MD  DATE OF DISCHARGE: 01/02/2015  PRIMARY CARE PHYSICIAN: WHITE, CHRISTINA M, FNP    ADMISSION DIAGNOSIS:  CVA (cerebral infarction) [I63.9] Transient cerebral ischemia, unspecified transient cerebral ischemia type [G45.9]  DISCHARGE DIAGNOSIS:  Active Problems:   TIA (transient ischemic attack)   SECONDARY DIAGNOSIS:   Past Medical History  Diagnosis Date  . Diabetes   . COPD (chronic obstructive pulmonary disease)   . Sleep apnea   . Hypertension   . Pancreatitis     HOSPITAL COURSE:   54 year old Mrs. Teresa [name] with past medical history of hypertension, type 2 diabetes, hyperlipidemia comes to the emergency room from [facility] after she started having periods of confusion, slurred speech and some weakness.  #1 suspected TIA. Patient's symptoms resolved . Her CT head is negative for acute infarct.  Continue 81 mg aspirin. Patient is declining MRI of the brain. negative carotid Doppler and echo done- awaited report. Patient is walking fine, without any focal weakness or problems. She feels completely at her baseline.  #2 type 2 diabetes continue home medications and sliding scale insulin.  #3 hypertension Resume home meds at discharge, blood pressure stable  #4 hyperlipidemia continue statins.  # 5 COPD with chronic, oxygen use  No acute exacerbation, continue same medications.  DISCHARGE CONDITIONS:   Stable  CONSULTS OBTAINED:     DRUG ALLERGIES:   Allergies  Allergen Reactions  . Amoxicillin Other (See Comments)    Body shakes  . Penicillins Other (See Comments)    Body shakes  . Shellfish Allergy Swelling    DISCHARGE MEDICATIONS:   Current Discharge Medication List    CONTINUE these medications which have CHANGED   Details   aspirin 81 MG tablet Take 1 tablet (81 mg total) by mouth daily. Qty: 30 tablet, Refills: 0    budesonide-formoterol (SYMBICORT) 80-4.5 MCG/ACT inhaler Inhale 2 puffs into the lungs daily. Qty: 1 Inhaler, Refills: 12      CONTINUE these medications which have NOT CHANGED   Details  acyclovir (ZOVIRAX) 400 MG tablet Take 400 mg by mouth 2 (two) times daily.    albuterol (PROVENTIL HFA;VENTOLIN HFA) 108 (90 BASE) MCG/ACT inhaler Inhale 1-2 puffs into the lungs every 6 (six) hours as needed for wheezing or shortness of breath.    ALPRAZolam (XANAX) 1 MG tablet Take 1 mg by mouth 3 (three) times daily as needed for anxiety. 3 to 4 times daily PRN    amLODipine (NORVASC) 5 MG tablet Take 5 mg by mouth daily.    atorvastatin (LIPITOR) 20 MG tablet Take 20 mg by mouth at bedtime.    enalapril (VASOTEC) 5 MG tablet Take 5 mg by mouth daily.    famotidine (PEPCID) 20 MG tablet Take 20 mg by mouth daily.    FLUoxetine HCl 60 MG TABS Take 60 mg by mouth daily.    furosemide (LASIX) 40 MG tablet Take 40 mg by mouth daily.    gabapentin (NEURONTIN) 400 MG capsule Take 400 mg by mouth 3 (three) times daily.    insulin glargine (LANTUS) 100 UNIT/ML injection Inject 68 Units into the skin at bedtime.    insulin lispro (HUMALOG) 100 UNIT/ML injection Inject 2 Units into the skin 3 (three) times daily with meals.    insulin regular (NOVOLIN R,HUMULIN R)  100 units/mL injection For use three times daily with sliding scale-150-199 = 2 units, 200-249= 4 units, 250-299=6 units, 300-349= 8 units, 350-399=10 units Qty: 100 mL, Refills: 3    metoprolol tartrate (LOPRESSOR) 25 MG tablet Take 25 mg by mouth 2 (two) times daily.    orphenadrine (NORFLEX) 100 MG tablet Take 100 mg by mouth 2 (two) times daily as needed for muscle spasms.    oxyCODONE-acetaminophen (PERCOCET/ROXICET) 5-325 MG per tablet Take 1 tablet by mouth every 6 (six) hours as needed for severe pain.    tiotropium (SPIRIVA) 18 MCG  inhalation capsule Place 18 mcg into inhaler and inhale daily.      STOP taking these medications     aspirin EC 81 MG tablet          DISCHARGE INSTRUCTIONS:    Follow-up with PMD in 1-2 weeks.  If you experience worsening of your admission symptoms, develop shortness of breath, life threatening emergency, suicidal or homicidal thoughts you must seek medical attention immediately by calling 911 or calling your MD immediately  if symptoms less severe.  You Must read complete instructions/literature along with all the possible adverse reactions/side effects for all the Medicines you take and that have been prescribed to you. Take any new Medicines after you have completely understood and accept all the possible adverse reactions/side effects.   Please note  You were cared for by a hospitalist during your hospital stay. If you have any questions about your discharge medications or the care you received while you were in the hospital after you are discharged, you can call the unit and asked to speak with the hospitalist on call if the hospitalist that took care of you is not available. Once you are discharged, your primary care physician will handle any further medical issues. Please note that NO REFILLS for any discharge medications will be authorized once you are discharged, as it is imperative that you return to your primary care physician (or establish a relationship with a primary care physician if you do not have one) for your aftercare needs so that they can reassess your need for medications and monitor your lab values.    Today   CHIEF COMPLAINT:   Chief Complaint  Patient presents with  . Transient Ischemic Attack    HISTORY OF PRESENT ILLNESS:  Teresa [name]  is a 54 y.o. female with a known history of hypertension, hyperlipidemia, type 2 diabetes, morbid obesity comes to the emergency room from [facility] clinic after she was noted to have slurred speech, confusion and some  weakness on her left side. The patient was hemodynamically stable during my evaluation. Neurologically she was intact during my evaluation Patient underwent CT scan of the head and was negative for acute stroke. She did not have any focal deficits. She was admitted for possible TIA. She was already on aspirin.   VITAL SIGNS:  Blood pressure 132/66, pulse 79, temperature 99 F (37.2 C), temperature source Oral, resp. rate 20, height  (1.575 m), weight 108.818 kg (239 lb 14.4 oz), SpO2 97 %.  I/O:   Intake/Output Summary (Last 24 hours) at 01/02/15 1429 Last data filed at 01/02/15 0800  Gross per 24 hour  Intake    240 ml  Output    400 ml  Net   -160 ml    PHYSICAL EXAMINATION:   GENERAL: 54 y.o.-year-old obase patient lying in the bed with no acute distress.  EYES: Pupils equal, round, reactive to light and accommodation.  No scleral icterus. Extraocular muscles intact.  HEENT: Head atraumatic, normocephalic. Oropharynx and nasopharynx clear.  NECK: Supple, no jugular venous distention. No thyroid enlargement, no tenderness.  LUNGS: Normal breath sounds bilaterally, no wheezing, rales,rhonchi or crepitation. No use of accessory muscles of respiration.  CARDIOVASCULAR: S1, S2 normal. No murmurs, rubs, or gallops.  ABDOMEN: Soft, nontender, nondistended. Bowel sounds present. No organomegaly or mass.  EXTREMITIES: No pedal edema, cyanosis, or clubbing.  NEUROLOGIC: Cranial nerves II through XII are intact. Muscle strength 5/5 in all extremities. Sensation intact. Gait not checked. Speech clear PSYCHIATRIC: The patient is alert and oriented x 3.  SKIN: No obvious rash, lesion, or ulcer.   DATA REVIEW:   CBC  Recent Labs Lab 01/01/15 1338  WBC 9.5  HGB 11.3*  HCT 34.4*  PLT 292    Chemistries   Recent Labs Lab 01/01/15 1338  NA 139  K 3.7  CL 100*  CO2 30  GLUCOSE 198*  BUN 17  CREATININE 1.06*  CALCIUM 9.2    Cardiac Enzymes No results for  input(s): TROPONINI in the last 168 hours.  Microbiology Results  Results for orders placed or performed in visit on 06/29/12  Culture, blood (single)     Status: None   Collection Time: 06/29/12  1:01 AM  Result Value Ref Range Status   Micro Text Report   Final       SOURCE: SET#1    COMMENT                   NO GROWTH AEROBICALLY/ANAEROBICALLY IN 5 DAYS   ANTIBIOTIC                                                      Culture, blood (single)     Status: None   Collection Time: 06/29/12 10:10 AM  Result Value Ref Range Status   Micro Text Report   Final       SOURCE: SET#2 RT AC    COMMENT                   NO GROWTH AEROBICALLY/ANAEROBICALLY IN 5 DAYS   ANTIBIOTIC                                                        RADIOLOGY:  Ct Head Wo Contrast  01/01/2015   CLINICAL DATA:  Slurred speech.  EXAM: CT HEAD WITHOUT CONTRAST  TECHNIQUE: Contiguous axial images were obtained from the base of the skull through the vertex without intravenous contrast.  COMPARISON:  CT scan of December 08, 2011.  FINDINGS: Bony calvarium appears intact. No mass effect or midline shift is noted. Ventricular size is within normal limits. There is no evidence of mass lesion, hemorrhage or acute infarction.  IMPRESSION: No acute intracranial abnormality seen.   Electronically Signed   By: Lupita Raider, M.D.   On: 01/01/2015 15:48   US Carotid Bilateral  01/01/2015   CLINICAL DATA:  Stroke, hypertension, diabetes mellitus, COPD, smoker  EXAM: BILATERAL CAROTID DUPLEX ULTRASOUND  TECHNIQUE: Wallace Cullens scale imaging, color Doppler and duplex ultrasound were performed of bilateral carotid and vertebral arteries  in the neck.  COMPARISON:  None  FINDINGS: Criteria: Quantification of carotid stenosis is based on velocity parameters that correlate the residual internal carotid diameter with NASCET-based stenosis levels, using the diameter of the distal internal carotid lumen as the denominator for stenosis  measurement.  The following velocity measurements were obtained:  RIGHT  ICA:  86/25 cm/sec  CCA:  119/25 cm/sec  SYSTOLIC ICA/CCA RATIO:  0.7  DIASTOLIC ICA/CCA RATIO:  1.0  ECA:  85 cm/sec  LEFT  ICA:  82/36 cm/sec  CCA:  114/22 cm/sec  SYSTOLIC ICA/CCA RATIO:  0.7  DIASTOLIC ICA/CCA RATIO:  1.6  ECA:  66 cm/sec  RIGHT CAROTID ARTERY: Tortuous RIGHT carotid system. Laminar flow on color Doppler imaging. Single tiny hypoechoic plaque proximal RIGHT ICA. No high velocity jet. Waveform analysis shows spectral broadening, likely due to tortuosity.  RIGHT VERTEBRAL ARTERY:  Patent, antegrade  LEFT CAROTID ARTERY: Tortuous LEFT carotid system. Laminar flow on color Doppler imaging. No significant plaque identified. Mild spectral broadening on waveform analysis likely related to tortuosity. No high velocity jets.  LEFT VERTEBRAL ARTERY:  Patent, antegrade  IMPRESSION: Single tiny plaque proximal RIGHT ICA.  No evidence of hemodynamically stenosis.   Electronically Signed   By: Ulyses Southward M.D.   On: 01/01/2015 18:10     Management plans discussed with the patient, family and they are in agreement.  CODE STATUS:     Code Status Orders        Start     Ordered   01/01/15 1709  Full code   Continuous     01/01/15 1708      TOTAL TIME TAKING CARE OF THIS PATIENT: 35 minutes.    Altamese Dilling M.D on 01/02/2015 at 2:29 PM  Between 7am to 6pm - Pager - [phone]  After 6pm go to [email or web address] - password [email or web address]  [facility]  Office  [phone]  CC: Primary care physician; WHITE, Valentina Shaggy, FNP   Note: This dictation was prepared with Dragon dictation along with smaller phrase technology. Any transcriptional errors that result from this process are unintentional.

## 2015-01-02 NOTE — Care Management (Signed)
Admitted to [facility] under observation status with the diagnosis of TIA. Lives alone. Daughter is Rayleen Wyrick [phone]). Seen by Dr. Cliffton Asters at [facility] yesterday, then sent to hospital. No home health. No skilled facility. Home oxygen for the last 3-5 years thru [facility]. Takes care of all basic and instrumental activities of daily living herself, still drives. No Life Alert. Uses no aids for ambulation. Daughter will transport.  Physical therapy evaluation completed. No follow-up recommendations. Possible discharge today. Gwenette Greet RN MSN Care Management [phone]

## 2015-01-02 NOTE — Progress Notes (Signed)
Inpatient Diabetes Program Recommendations  AACE/ADA: New Consensus Statement on Inpatient Glycemic Control (2015)  Target Ranges:  Prepandial:   less than 140 mg/dL      Peak postprandial:   less than 180 mg/dL (1-2 hours)      Critically ill patients:  140 - 180 mg/dL   Review of Glycemic Control  Diabetes history: Type 2 diabetes  Outpatient Diabetes medications: Lantus 80 units by pen qhs, Patient could not clearly identify the insulins she's taking.  She thinks she not on R insulin anymore and thinks she takes Novolog 2 units at each meal and Novolog based on a sliding scale 2 hours after the meal.  This is not what is documented in the medication record.   Current orders for Inpatient glycemic control: Novolog 0-15 units tid with meals  Although patient tells me she "always" takes her insulin at bedtime, she did not take it last night before she came to the ED.    Inpatient Diabetes Program Recommendations:     1. Please consider ordering an A1C ; Per ADA recommendations "consider performing an A1C on all patients with diabetes or hypreglycemia admitted to the hospital      if not performed in the prior 3 months". 2. Consider starting 1/2 of her home dose of Lantus (80 units) beginning tonight;  Lantus 40 units qhs- increase tomorrow as needed if fasting blood sugars are     elevated 3. Consider ordering Novolog 2 units tid with meals.  4. Continue Novolog correction insulin 0-15 units tid with meals 5. Consider ordering Novolog correction insulin 0-5 units qhs  Susette Racer, RN, [place], [place], CDE Diabetes Coordinator Inpatient Diabetes Program  [phone] (Team Pager) [phone] [facility] Office) 01/02/2015 12:35 PM

## 2015-01-02 NOTE — H&P (Signed)
[facility] - [place] at [facility]   PATIENT NAME: Teresa [name]    MR#:  [id number]  DATE OF BIRTH:  10-20-1960  DATE OF ADMISSION:  01/01/2015  PRIMARY CARE PHYSICIAN: WHITE, Valentina Shaggy, FNP   REQUESTING/REFERRING PHYSICIAN: Dr. Darnelle Catalan  CHIEF COMPLAINT:  Slurred speech and left-sided weakness  HISTORY OF PRESENT ILLNESS:  Teresa [name]  is a 54 y.o. female with a known history of hypertension, hyperlipidemia, type 2 diabetes, morbid obesity comes to the emergency room from [facility] clinic after she was noted to have slurred speech, confusion and some weakness on her left side. The patient was hemodynamically stable during my evaluation. Neurologically she was intact during my evaluation Patient underwent CT scan of the head and was negative for acute stroke. She did not have any focal deficits. She was admitted for possible TIA. She was already on aspirin.  PAST MEDICAL HISTORY:   Past Medical History  Diagnosis Date  . Diabetes   . COPD (chronic obstructive pulmonary disease)   . Sleep apnea   . Hypertension   . Pancreatitis     PAST SURGICAL HISTOIRY:   Past Surgical History  Procedure Laterality Date  . Cardiac surgery    . Wrist surgery Bilateral   . Cyst excision      SOCIAL HISTORY:   Social History  Substance Use Topics  . Smoking status: Current Some Day Smoker    Types: Cigarettes  . Smokeless tobacco: Not on file  . Alcohol Use: No    FAMILY HISTORY:  Hypertension  DRUG ALLERGIES:   Allergies  Allergen Reactions  . Amoxicillin Other (See Comments)    Body shakes  . Penicillins Other (See Comments)    Body shakes  . Shellfish Allergy Swelling    REVIEW OF SYSTEMS:  Review of Systems  Constitutional: Negative for fever, chills and weight loss.  HENT: Negative for ear discharge, ear pain and nosebleeds.   Eyes: Negative for blurred vision, pain and discharge.  Respiratory: Negative for sputum production,  shortness of breath, wheezing and stridor.   Cardiovascular: Negative for chest pain, palpitations, orthopnea and PND.  Gastrointestinal: Negative for nausea, vomiting, abdominal pain and diarrhea.  Genitourinary: Negative for urgency and frequency.  Musculoskeletal: Negative for back pain and joint pain.  Neurological: Positive for speech change and weakness. Negative for sensory change and focal weakness.  Psychiatric/Behavioral: Negative for depression. The patient is not nervous/anxious.   All other systems reviewed and are negative.    MEDICATIONS AT HOME:   Prior to Admission medications   Medication Sig Start Date End Date Taking? Authorizing Provider  acyclovir (ZOVIRAX) 400 MG tablet Take 400 mg by mouth 2 (two) times daily.   Yes Historical Provider, MD  albuterol (PROVENTIL HFA;VENTOLIN HFA) 108 (90 BASE) MCG/ACT inhaler Inhale 1-2 puffs into the lungs every 6 (six) hours as needed for wheezing or shortness of breath.   Yes Historical Provider, MD  ALPRAZolam Prudy Feeler) 1 MG tablet Take 1 mg by mouth 3 (three) times daily as needed for anxiety. 3 to 4 times daily PRN   Yes Historical Provider, MD  amLODipine (NORVASC) 5 MG tablet Take 5 mg by mouth daily.   Yes Historical Provider, MD  aspirin EC 81 MG tablet Take 81 mg by mouth daily.   Yes Historical Provider, MD  atorvastatin (LIPITOR) 20 MG tablet Take 20 mg by mouth at bedtime.   Yes Historical Provider, MD  enalapril (VASOTEC) 5 MG tablet Take 5 mg by  mouth daily.   Yes Historical Provider, MD  famotidine (PEPCID) 20 MG tablet Take 20 mg by mouth daily.   Yes Historical Provider, MD  FLUoxetine HCl 60 MG TABS Take 60 mg by mouth daily.   Yes Historical Provider, MD  furosemide (LASIX) 40 MG tablet Take 40 mg by mouth daily.   Yes Historical Provider, MD  gabapentin (NEURONTIN) 400 MG capsule Take 400 mg by mouth 3 (three) times daily.   Yes Historical Provider, MD  insulin glargine (LANTUS) 100 UNIT/ML injection Inject 68 Units  into the skin at bedtime.   Yes Historical Provider, MD  insulin lispro (HUMALOG) 100 UNIT/ML injection Inject 2 Units into the skin 3 (three) times daily with meals.   Yes Historical Provider, MD  insulin regular (NOVOLIN R,HUMULIN R) 100 units/mL injection For use three times daily with sliding scale-150-199 = 2 units, 200-249= 4 units, 250-299=6 units, 300-349= 8 units, 350-399=10 units 12/06/14 12/06/15 Yes Emily Filbert, MD  metoprolol tartrate (LOPRESSOR) 25 MG tablet Take 25 mg by mouth 2 (two) times daily.   Yes Historical Provider, MD  orphenadrine (NORFLEX) 100 MG tablet Take 100 mg by mouth 2 (two) times daily as needed for muscle spasms.   Yes Historical Provider, MD  oxyCODONE-acetaminophen (PERCOCET/ROXICET) 5-325 MG per tablet Take 1 tablet by mouth every 6 (six) hours as needed for severe pain.   Yes Historical Provider, MD  tiotropium (SPIRIVA) 18 MCG inhalation capsule Place 18 mcg into inhaler and inhale daily.   Yes Historical Provider, MD      VITAL SIGNS:  Blood pressure 132/66, pulse 79, temperature 99 F (37.2 C), temperature source Oral, resp. rate 20, height  (1.575 m), weight 108.818 kg (239 lb 14.4 oz), SpO2 97 %.  PHYSICAL EXAMINATION:  GENERAL:  54 y.o.-year-old patient lying in the bed with no acute distress.  EYES: Pupils equal, round, reactive to light and accommodation. No scleral icterus. Extraocular muscles intact.  HEENT: Head atraumatic, normocephalic. Oropharynx and nasopharynx clear.  NECK:  Supple, no jugular venous distention. No thyroid enlargement, no tenderness.  LUNGS: Normal breath sounds bilaterally, no wheezing, rales,rhonchi or crepitation. No use of accessory muscles of respiration.  CARDIOVASCULAR: S1, S2 normal. No murmurs, rubs, or gallops.  ABDOMEN: Soft, nontender, nondistended. Bowel sounds present. No organomegaly or mass.  EXTREMITIES: No pedal edema, cyanosis, or clubbing.  NEUROLOGIC: Cranial nerves II through XII are  intact. Muscle strength 5/5 in all extremities. Sensation intact. Gait not checked. Speech clear PSYCHIATRIC: The patient is alert and oriented x 3.  SKIN: No obvious rash, lesion, or ulcer.   LABORATORY PANEL:   CBC  Recent Labs Lab 01/01/15 1338  WBC 9.5  HGB 11.3*  HCT 34.4*  PLT 292   ------------------------------------------------------------------------------------------------------------------  Chemistries   Recent Labs Lab 01/01/15 1338  NA 139  K 3.7  CL 100*  CO2 30  GLUCOSE 198*  BUN 17  CREATININE 1.06*  CALCIUM 9.2   ------------------------------------------------------------------------------------------------------------------  Cardiac Enzymes No results for input(s): TROPONINI in the last 168 hours. ------------------------------------------------------------------------------------------------------------------  RADIOLOGY:  Ct Head Wo Contrast  01/01/2015   CLINICAL DATA:  Slurred speech.  EXAM: CT HEAD WITHOUT CONTRAST  TECHNIQUE: Contiguous axial images were obtained from the base of the skull through the vertex without intravenous contrast.  COMPARISON:  CT scan of December 08, 2011.  FINDINGS: Bony calvarium appears intact. No mass effect or midline shift is noted. Ventricular size is within normal limits. There is no evidence of mass lesion,  hemorrhage or acute infarction.  IMPRESSION: No acute intracranial abnormality seen.   Electronically Signed   By: Lupita Raider, M.D.   On: 01/01/2015 15:48   US Carotid Bilateral  01/01/2015   CLINICAL DATA:  Stroke, hypertension, diabetes mellitus, COPD, smoker  EXAM: BILATERAL CAROTID DUPLEX ULTRASOUND  TECHNIQUE: Wallace Cullens scale imaging, color Doppler and duplex ultrasound were performed of bilateral carotid and vertebral arteries in the neck.  COMPARISON:  None  FINDINGS: Criteria: Quantification of carotid stenosis is based on velocity parameters that correlate the residual internal carotid diameter with  NASCET-based stenosis levels, using the diameter of the distal internal carotid lumen as the denominator for stenosis measurement.  The following velocity measurements were obtained:  RIGHT  ICA:  86/25 cm/sec  CCA:  119/25 cm/sec  SYSTOLIC ICA/CCA RATIO:  0.7  DIASTOLIC ICA/CCA RATIO:  1.0  ECA:  85 cm/sec  LEFT  ICA:  82/36 cm/sec  CCA:  114/22 cm/sec  SYSTOLIC ICA/CCA RATIO:  0.7  DIASTOLIC ICA/CCA RATIO:  1.6  ECA:  66 cm/sec  RIGHT CAROTID ARTERY: Tortuous RIGHT carotid system. Laminar flow on color Doppler imaging. Single tiny hypoechoic plaque proximal RIGHT ICA. No high velocity jet. Waveform analysis shows spectral broadening, likely due to tortuosity.  RIGHT VERTEBRAL ARTERY:  Patent, antegrade  LEFT CAROTID ARTERY: Tortuous LEFT carotid system. Laminar flow on color Doppler imaging. No significant plaque identified. Mild spectral broadening on waveform analysis likely related to tortuosity. No high velocity jets.  LEFT VERTEBRAL ARTERY:  Patent, antegrade  IMPRESSION: Single tiny plaque proximal RIGHT ICA.  No evidence of hemodynamically stenosis.   Electronically Signed   By: Ulyses Southward M.D.   On: 01/01/2015 18:10   IMPRESSION AND PLAN:   54 year old [name] with past medical history of hypertension, type 2 diabetes, hyperlipidemia comes to the emergency room from [facility] clinic after she started having periods of confusion, slurred speech and some weakness.  #1 suspected TIA. Patient's symptoms resolved during my evaluation in the emergency room. Her CT head is negative for acute infarct.  Continue baby aspirin. Patient is declining MRI of the brain. We'll get carotid Doppler and echo of the heart. Neuro check per stroke protocol.  #2 type 2 diabetes continue home medications and sliding scale insulin. Sugars were stable this morning.  #3 hypertension Resume home meds at discharge  #4 hyperlipidemia continue statins.   All the records are reviewed and case discussed with ED  provider. Management plans discussed with the patient, family and they are in agreement.  CODE STATUS: Full TOTAL TIME TAKING CARE OF THIS PATIENT:  50 minutes.    PATEL,SONA M.D on 01/01/2015   Between 7am to 6pm - Pager - [phone]  After 6pm go to [email or web address] - password [email or web address] [facility]  [facility]  Office  [phone]  CC: Primary care physician; WHITE, Valentina Shaggy, FNP

## 2015-01-02 NOTE — Plan of Care (Signed)
Problem: Phase I Progression Outcomes Goal: Hemodynamically stable Outcome: Progressing Patient admitted for observation for slurred speech, imbalance and hyperglycemia.  Symptoms of slurred speech and imbalance completely resolved.  Patient alert and oriented, feeling better NIH 0.  SR on telemetry monitor.  VSS

## 2015-01-02 NOTE — Evaluation (Signed)
Physical Therapy Evaluation Patient Details Name: Teresa [name] MRN: [id number] DOB: [birth year] Today's Date: 01/02/2015   History of Present Illness  Pt had some minimal weakness, now feeling back to baseline   Clinical Impression  Pt is able to ambulate well, shows good ability to stand and maintain balance and walks 200 ft w/o AD and w/o issue.  She is safe t/o the session and has no real fatigue with the effort.  She is feeling at her baseline and is ready to go home.     Follow Up Recommendations No PT follow up    Equipment Recommendations  None recommended by PT    Recommendations for Other Services       Precautions / Restrictions Precautions Precautions: Fall Restrictions Weight Bearing Restrictions: No      Mobility  Bed Mobility Overal bed mobility: Independent                Transfers Overall transfer level: Independent Equipment used: None             General transfer comment: pt has no balance/safety concerns getting to standing w/o AD   Ambulation/Gait Ambulation/Gait assistance: Independent Ambulation Distance (Feet): 200 Feet Assistive device: None (portable O2 compressor)       General Gait Details: Pt with consistent, deliberate gait (reports she is walking at her baseline speed) with no LOBs and no fatigue.    Stairs            Wheelchair Mobility    Modified Rankin (Stroke Patients Only)       Balance Overall balance assessment: Independent                                           Pertinent Vitals/Pain Pain Assessment: No/denies pain    Home Living Family/patient expects to be discharged to:: Private residence Living Arrangements:  (grandkids stay at night) Available Help at Discharge: Family   Home Access: Ramped entrance       Home Equipment: Cane - single point      Prior Function Level of Independence: Independent (on O2 24/7)         Comments: Pt reports that she is out of  the house multiple times a day and is able to run all her errands, etc w/o issue     Hand Dominance        Extremity/Trunk Assessment   Upper Extremity Assessment: Overall WFL for tasks assessed           Lower Extremity Assessment: Overall WFL for tasks assessed         Communication   Communication: No difficulties  Cognition Arousal/Alertness: Awake/alert Behavior During Therapy: WFL for tasks assessed/performed Overall Cognitive Status: Within Functional Limits for tasks assessed                      General Comments      Exercises        Assessment/Plan    PT Assessment Patent does not need any further PT services  PT Diagnosis Difficulty walking   PT Problem List    PT Treatment Interventions     PT Goals (Current goals can be found in the Care Plan section)      Frequency     Barriers to discharge        Co-evaluation  End of Session Equipment Utilized During Treatment: Oxygen Activity Tolerance: Patient tolerated treatment well Patient left: in bed      Functional Assessment Tool Used: clinical judgement Functional Limitation: Mobility: Walking and moving around Mobility: Walking and Moving Around Current Status (Z6109): At least 1 percent but less than 20 percent impaired, limited or restricted Mobility: Walking and Moving Around Goal Status [phone]): At least 1 percent but less than 20 percent impaired, limited or restricted Mobility: Walking and Moving Around Discharge Status [phone]): At least 1 percent but less than 20 percent impaired, limited or restricted    Time:  -      Charges:   PT Evaluation $Initial PT Evaluation Tier I: 1 Procedure     PT G Codes:   PT G-Codes **NOT FOR INPATIENT CLASS** Functional Assessment Tool Used: clinical judgement Functional Limitation: Mobility: Walking and moving around Mobility: Walking and Moving Around Current Status (B1478): At least 1 percent but less than 20  percent impaired, limited or restricted Mobility: Walking and Moving Around Goal Status [phone]): At least 1 percent but less than 20 percent impaired, limited or restricted Mobility: Walking and Moving Around Discharge Status [phone]): At least 1 percent but less than 20 percent impaired, limited or restricted   Loran Senters, PT, DPT [phone]  Malachi Pro 01/02/2015, 10:22 AM

## 2015-01-02 NOTE — Progress Notes (Signed)
  Echocardiogram 2D Echocardiogram has been performed.  Teresa [name] 01/02/2015, 12:41 PM

## 2015-01-02 NOTE — Progress Notes (Signed)
MD making rounds. Discharge orders received. Telemetry Removed. IV removed. Prescriptions E-Prescribed to Pharmacy. Discharge paperwork provided, explained, signed and witnessed. Education handouts provided to patient. No unanswered questions. Escorted via wheelchair by nursing staff. All belongings sent with patient and family.

## 2015-01-06 ENCOUNTER — Ambulatory Visit: Payer: Medicare Other

## 2015-01-22 ENCOUNTER — Ambulatory Visit: Payer: Medicare Other

## 2015-04-07 ENCOUNTER — Ambulatory Visit (INDEPENDENT_AMBULATORY_CARE_PROVIDER_SITE_OTHER): Payer: Medicare Other | Admitting: Sports Medicine

## 2015-04-07 ENCOUNTER — Encounter: Payer: Self-pay | Admitting: Sports Medicine

## 2015-04-07 DIAGNOSIS — M79676 Pain in unspecified toe(s): Secondary | ICD-10-CM | POA: Diagnosis not present

## 2015-04-07 DIAGNOSIS — E0800 Diabetes mellitus due to underlying condition with hyperosmolarity without nonketotic hyperglycemic-hyperosmolar coma (NKHHC): Secondary | ICD-10-CM

## 2015-04-07 DIAGNOSIS — B351 Tinea unguium: Secondary | ICD-10-CM | POA: Diagnosis not present

## 2015-04-07 NOTE — Progress Notes (Signed)
Patient ID: Teresa [name], female   DOB: 1960-05-07, 54 y.o.   MRN: [id number] Subjective: Teresa [name] is a 54 y.o. female patient with history of type 2 diabetes who presents to office today complaining of long, painful nails  while ambulating in shoes; unable to trim. Patient states that the glucose reading this morning was /dl on insulin sliding scale.Patient denies any new changes in medication or new problems. Patient denies any new cramping, numbness, burning or tingling in the legs.  Patient Active Problem List   Diagnosis Date Noted  . TIA (transient ischemic attack) 01/01/2015   Current Outpatient Prescriptions on File Prior to Visit  Medication Sig Dispense Refill  . acyclovir (ZOVIRAX) 400 MG tablet Take 400 mg by mouth 2 (two) times daily.    Marland Kitchen albuterol (PROVENTIL HFA;VENTOLIN HFA) 108 (90 BASE) MCG/ACT inhaler Inhale 1-2 puffs into the lungs every 6 (six) hours as needed for wheezing or shortness of breath.    . ALPRAZolam (XANAX) 1 MG tablet Take 1 mg by mouth 3 (three) times daily as needed for anxiety. 3 to 4 times daily PRN    . amLODipine (NORVASC) 5 MG tablet Take 5 mg by mouth daily.    Marland Kitchen aspirin 81 MG tablet Take 1 tablet (81 mg total) by mouth daily. 30 tablet 0  . atorvastatin (LIPITOR) 20 MG tablet Take 20 mg by mouth at bedtime.    . budesonide-formoterol (SYMBICORT) 80-4.5 MCG/ACT inhaler Inhale 2 puffs into the lungs daily. 1 Inhaler 12  . enalapril (VASOTEC) 5 MG tablet Take 5 mg by mouth daily.    . famotidine (PEPCID) 20 MG tablet Take 20 mg by mouth daily.    Marland Kitchen FLUoxetine HCl 60 MG TABS Take 60 mg by mouth daily.    . furosemide (LASIX) 40 MG tablet Take 40 mg by mouth daily.    Marland Kitchen gabapentin (NEURONTIN) 400 MG capsule Take 400 mg by mouth 3 (three) times daily.    . insulin glargine (LANTUS) 100 UNIT/ML injection Inject 68 Units into the skin at bedtime.    . insulin lispro (HUMALOG) 100 UNIT/ML injection Inject 2 Units into the skin 3 (three) times  daily with meals.    . insulin regular (NOVOLIN R,HUMULIN R) 100 units/mL injection For use three times daily with sliding scale-150-199 = 2 units, 200-249= 4 units, 250-299=6 units, 300-349= 8 units, 350-399=10 units 100 mL 3  . metoprolol tartrate (LOPRESSOR) 25 MG tablet Take 25 mg by mouth 2 (two) times daily.    . orphenadrine (NORFLEX) 100 MG tablet Take 100 mg by mouth 2 (two) times daily as needed for muscle spasms.    Marland Kitchen oxyCODONE-acetaminophen (PERCOCET/ROXICET) 5-325 MG per tablet Take 1 tablet by mouth every 6 (six) hours as needed for severe pain.    Marland Kitchen tiotropium (SPIRIVA) 18 MCG inhalation capsule Place 18 mcg into inhaler and inhale daily.     Current Facility-Administered Medications on File Prior to Visit  Medication Dose Route Frequency Provider Last Rate Last Dose  . triamcinolone acetonide (KENALOG) 10 MG/ML injection 10 mg  10 mg Other Once Vivi Barrack, DPM       Allergies  Allergen Reactions  . Amoxicillin Other (See Comments)    Body shakes  . Penicillins Other (See Comments)    Body shakes  . Shellfish Allergy Swelling   Labs: HEMOGLOBIN A1C- No recent lab  Objective: General: Patient is awake, alert, and oriented x 3 and in no acute distress on Oxygen  Integument: Skin is warm, dry and supple bilateral with mild callus skin at bilateral heels with no opening or signs of infection. Nails are tender, long, thickened and dystrophic with subungual debris, consistent with onychomycosis, 1-5 bilateral. No signs of infection. No open lesions or preulcerative lesions present bilateral. Remaining integument unremarkable.  Vasculature:  Dorsalis Pedis pulse 2/4 bilateral. Posterior Tibial pulse  1/4 bilateral.  Capillary fill time <3 sec 1-5 bilateral. Scant hair growth to the level of the digits. Temperature gradient within normal limits. No varicosities present bilateral. No edema present bilateral.   Neurology: The patient has intact sensation measured with a  5.07/10g Semmes Weinstein Monofilament at all pedal sites bilateral . Vibratory sensation diminished bilateral with tuning fork. No Babinski sign present bilateral.   Musculoskeletal: No gross pedal deformities noted bilateral. Muscular strength 5/5 in all lower extremity muscular groups bilateral without pain or limitation on range of motion . No tenderness with calf compression bilateral.  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 -Discussed and educated patient on diabetic foot care, especially with  regards to the vascular, neurological and musculoskeletal systems.  -Stressed the importance of good glycemic control and the detriment of not  controlling glucose levels in relation to the foot. -Mechanically debrided all nails 1-5 bilateral using sterile nail nipper and filed with dremel without incident  -Recommend daily skin emollients dry skin -Answered all patient questions -Patient to return  in 3 months for at risk foot care -Patient advised to call the office if any problems or questions arise in the meantime.  Asencion Islamitorya Armoni Kludt, DPM

## 2015-04-08 ENCOUNTER — Ambulatory Visit: Payer: Medicare Other | Admitting: Podiatry

## 2015-07-07 ENCOUNTER — Ambulatory Visit: Payer: Medicare Other | Admitting: Sports Medicine

## 2015-07-20 ENCOUNTER — Encounter: Payer: Self-pay | Admitting: *Deleted

## 2015-08-06 ENCOUNTER — Encounter: Payer: Self-pay | Admitting: Urology

## 2015-08-06 ENCOUNTER — Ambulatory Visit (INDEPENDENT_AMBULATORY_CARE_PROVIDER_SITE_OTHER): Payer: Medicare Other | Admitting: Urology

## 2015-08-06 VITALS — BP 129/65 | HR 101 | Ht 63.0 in | Wt 244.9 lb

## 2015-08-06 DIAGNOSIS — IMO0001 Reserved for inherently not codable concepts without codable children: Secondary | ICD-10-CM | POA: Insufficient documentation

## 2015-08-06 DIAGNOSIS — R350 Frequency of micturition: Secondary | ICD-10-CM | POA: Diagnosis not present

## 2015-08-06 DIAGNOSIS — IMO0002 Reserved for concepts with insufficient information to code with codable children: Secondary | ICD-10-CM

## 2015-08-06 DIAGNOSIS — R3129 Other microscopic hematuria: Secondary | ICD-10-CM | POA: Insufficient documentation

## 2015-08-06 DIAGNOSIS — N8110 Cystocele, unspecified: Secondary | ICD-10-CM

## 2015-08-06 LAB — URINALYSIS, COMPLETE
Bilirubin, UA: NEGATIVE
Ketones, UA: NEGATIVE
Leukocytes, UA: NEGATIVE
Nitrite, UA: NEGATIVE
PROTEIN UA: NEGATIVE
Specific Gravity, UA: <1.005 — ABNORMAL LOW (ref 1.005–1.030)
UUROB: 0.2 mg/dL (ref 0.2–1.0)
pH, UA: 5.5 (ref 5.0–7.5)

## 2015-08-06 LAB — MICROSCOPIC EXAMINATION: RBC MICROSCOPIC, UA: NONE SEEN /HPF (ref 0–?)

## 2015-08-06 NOTE — Progress Notes (Signed)
08/06/2015 11:56 AM   [name] 10/18/1960 [id number]  Referring provider: Veneda Melter, FNP [address] [place], [place] [address]  Chief Complaint  Patient presents with  . Hematuria    referred by [facility]  . Urinary Frequency    HPI:  Patient is a 55 year old African-American female who is referred by her primary care provider, Veneda Melter, FNP, for microscopic hematuria and symptoms of interstitial cystitis versus overactive bladder.  Patient states that she has not had episodes of gross hematuria. She states that she has been told she has blood in her urine. I do not have a microscopic urinalysis report available to me at this visit. I do have a dipstick urinalysis that was positive for blood with the referral notes.  I did find 2 microscopic urinalyses from hospitalizations that did contain red blood cells. An UA from 12/06/2014 noted 0-5 RBCs per high power field. An UA from 01/01/2015 noted 0-5 RBCs per high-power field.  She does not have a prior history of nephrolithiasis, trauma to the genitourinary tract or malignancies of the genitourinary tract.  She states she does have a prior history of urinary tract infections.    She does not have a family medical history of nephrolithiasis, malignancies of the genitourinary tract or hematuria.   Today, she is having symptoms of frequent urination, dysuria, nocturia x 3, incontinence and a weak urinary stream.  Her UA today does not demonstrate microscopic hematuria.  She is experiencing suprapubic pain.  She denies abdominal pain or flank pain. She has not had any recent fevers, chills or vomiting. She is experiencing nausea.  She did undergo a CT of the abdomen and pelvis with contrast on 07/10/2014. No acute abnormalities in the abdomen or pelvis were noted.  She is a smoker.   PMH: Past Medical History  Diagnosis Date  . Diabetes (HCC)   . COPD (chronic obstructive pulmonary disease) (HCC)     . Sleep apnea   . Hypertension   . Pancreatitis   . Microscopic hematuria   . Chest pain   . Urinary frequency   . Impetigo   . Chronic kidney disease   . Diabetic retinopathy (HCC)   . Urinary incontinence   . DDD (degenerative disc disease), lumbar   . Chronic back pain   . Ventricular septal defect   . Bilateral leg edema   . Genital herpes   . Obesity, morbid (HCC)   . CAD (coronary artery disease)   . HLD (hyperlipidemia)   . Anxiety disorder   . Peripheral neuropathy (HCC)   . Depression   . Heart burn   . Arthritis   . Heart disease   . Heart murmur   . Hepatitis   . Thyroid disease     Surgical History: Past Surgical History  Procedure Laterality Date  . Cardiac surgery    . Wrist surgery Bilateral   . Cyst excision      Home Medications:    Medication List       This list is accurate as of: 08/06/15 11:56 AM.  Always use your most recent med list.               acyclovir 400 MG tablet  Commonly known as:  ZOVIRAX  Take 400 mg by mouth 2 (two) times daily.     albuterol 108 (90 Base) MCG/ACT inhaler  Commonly known as:  PROVENTIL HFA;VENTOLIN HFA  Inhale 1-2 puffs into the lungs  every 6 (six) hours as needed for wheezing or shortness of breath.     ALPRAZolam 1 MG tablet  Commonly known as:  XANAX  Take 1 mg by mouth 3 (three) times daily as needed for anxiety. 3 to 4 times daily PRN     amLODipine 5 MG tablet  Commonly known as:  NORVASC  Take 5 mg by mouth daily.     ANORO ELLIPTA 62.5-25 MCG/INH Aepb  Generic drug:  umeclidinium-vilanterol  Inhale into the lungs.     aspirin 81 MG tablet  Take 1 tablet (81 mg total) by mouth daily.     atorvastatin 20 MG tablet  Commonly known as:  LIPITOR  Take 20 mg by mouth at bedtime.     azithromycin 250 MG tablet  Commonly known as:  ZITHROMAX  take 2 tablets by mouth today then take 1 tablet DAILY FOR 4 DAYS     budesonide-formoterol 80-4.5 MCG/ACT inhaler  Commonly known as:   SYMBICORT  Inhale 2 puffs into the lungs daily.     cyclobenzaprine 10 MG tablet  Commonly known as:  FLEXERIL  take 1 tablet by mouth at bedtime if needed for BACK PAIN     enalapril 5 MG tablet  Commonly known as:  VASOTEC  Take 5 mg by mouth daily.     famotidine 20 MG tablet  Commonly known as:  PEPCID  Take 20 mg by mouth daily. Reported on 08/06/2015     FLUoxetine HCl 60 MG Tabs  Take 60 mg by mouth daily.     fluticasone 50 MCG/ACT nasal spray  Commonly known as:  FLONASE     furosemide 40 MG tablet  Commonly known as:  LASIX  Take 40 mg by mouth daily.     gabapentin 400 MG capsule  Commonly known as:  NEURONTIN  Take 400 mg by mouth 3 (three) times daily.     insulin glargine 100 UNIT/ML injection  Commonly known as:  LANTUS  Inject 68 Units into the skin at bedtime.     insulin lispro 100 UNIT/ML injection  Commonly known as:  HUMALOG  Inject 2 Units into the skin 3 (three) times daily with meals.     insulin regular 100 units/mL injection  Commonly known as:  NOVOLIN R,HUMULIN R  For use three times daily with sliding scale-150-199 = 2 units, 200-249= 4 units, 250-299=6 units, 300-349= 8 units, 350-399=10 units     metoprolol tartrate 25 MG tablet  Commonly known as:  LOPRESSOR  Take 25 mg by mouth 2 (two) times daily. Reported on 08/06/2015     orphenadrine 100 MG tablet  Commonly known as:  NORFLEX  Take 100 mg by mouth 2 (two) times daily as needed for muscle spasms. Reported on 08/06/2015     oxybutynin 5 MG 24 hr tablet  Commonly known as:  DITROPAN-XL  take 1 tablet by mouth once daily for BLADDER URGENCY     oxyCODONE-acetaminophen 5-325 MG tablet  Commonly known as:  PERCOCET/ROXICET  Take 1 tablet by mouth every 6 (six) hours as needed for severe pain.     OXYGEN  Inhale into the lungs.     tiotropium 18 MCG inhalation capsule  Commonly known as:  SPIRIVA  Place 18 mcg into inhaler and inhale daily. Reported on 08/06/2015     traZODone  100 MG tablet  Commonly known as:  DESYREL  Take by mouth.        Allergies:  Allergies  Allergen Reactions  . Sulfa Antibiotics Shortness Of Breath  . Amoxicillin Other (See Comments)    Body shakes  . Penicillins Other (See Comments)    Body shakes  . Shellfish Allergy Swelling    Family History: Family History  Problem Relation Age of Onset  . Diabetes      2 Brother 2 Sister  . Hypertension    . Kidney disease Neg Hx   . Bladder Cancer Neg Hx     Social History:  reports that she has been smoking Cigarettes.  She does not have any smokeless tobacco history on file. She reports that she does not drink alcohol or use illicit drugs.  ROS: UROLOGY Frequent Urination?: Yes Hard to postpone urination?: No Burning/pain with urination?: Yes Get up at night to urinate?: Yes Leakage of urine?: Yes Urine stream starts and stops?: Yes Trouble starting stream?: No Do you have to strain to urinate?: No Blood in urine?: Yes Urinary tract infection?: Yes Sexually transmitted disease?: No Injury to kidneys or bladder?: No Painful intercourse?: No Weak stream?: Yes Currently pregnant?: No Vaginal bleeding?: No Last menstrual period?: n  Gastrointestinal Nausea?: Yes Vomiting?: No Indigestion/heartburn?: Yes Diarrhea?: No Constipation?: No  Constitutional Fever: No Night sweats?: Yes Weight loss?: No Fatigue?: Yes  Skin Skin rash/lesions?: No Itching?: Yes  Eyes Blurred vision?: Yes Double vision?: No  Ears/Nose/Throat Sore throat?: No Sinus problems?: Yes  Hematologic/Lymphatic Swollen glands?: No Easy bruising?: Yes  Cardiovascular Leg swelling?: Yes Chest pain?: Yes  Respiratory Cough?: Yes Shortness of breath?: Yes  Endocrine Excessive thirst?: Yes  Musculoskeletal Back pain?: Yes Joint pain?: Yes  Neurological Headaches?: Yes Dizziness?: Yes  Psychologic Depression?: Yes Anxiety?: Yes  Physical Exam: BP 129/65 mmHg  Pulse  101  Ht  (1.6 m)  Wt 244 lb 14.4 oz (111.086 kg)  BMI 43.39 kg/m2  Constitutional: Well nourished. Alert and oriented, No acute distress. HEENT:  AT, moist mucus membranes. Trachea midline, no masses. Cardiovascular: No clubbing, cyanosis, or edema. Respiratory: Normal respiratory effort, no increased work of breathing. GI: Abdomen is soft, non tender, non distended, no abdominal masses. Liver and spleen not palpable.  No hernias appreciated.  Stool sample for occult testing is not indicated.   GU: No CVA tenderness.  No bladder fullness or masses.  Normal external genitalia, normal pubic hair distribution, no lesions.  Normal urethral meatus, no lesions, no prolapse, no discharge.   No urethral masses, tenderness and/or tenderness. No bladder fullness, tenderness or masses. Normal vagina mucosa, good estrogen effect, no discharge, no lesions, good pelvic support, Grade II cystocele is noted.  No rectocele is noted.  No cervical motion tenderness.  Uterus is freely mobile and non-fixed.  No adnexal/parametria masses or tenderness noted.  Anus and perineum are without rashes or lesions.    Skin: No rashes, bruises or suspicious lesions. Lymph: No cervical or inguinal adenopathy. Neurologic: Grossly intact, no focal deficits, moving all 4 extremities. Psychiatric: Normal mood and affect.  Laboratory Data: Lab Results  Component Value Date   WBC 9.5 01/01/2015   HGB 11.3* 01/01/2015   HCT 34.4* 01/01/2015   MCV 91.9 01/01/2015   PLT 292 01/01/2015    Lab Results  Component Value Date   CREATININE 1.06* 01/01/2015    Lab Results  Component Value Date   HGBA1C 6.5* 09/08/2012    Lab Results  Component Value Date   TSH 1.73 12/08/2011       Component Value Date/Time   CHOL 119 09/08/2012  0240   HDL 48 09/08/2012 0240   VLDL 16 09/08/2012 0240   LDLCALC 55 09/08/2012 0240    Lab Results  Component Value Date   AST 22 07/10/2014   Lab Results  Component Value  Date   ALT 16 07/10/2014    Urinalysis Results for orders placed or performed in visit on 08/06/15  Microscopic Examination  Result Value Ref Range   WBC, UA 0-5 0 -  5 /hpf   RBC, UA None seen 0 -  2 /hpf   Epithelial Cells (non renal) 0-10 0 - 10 /hpf   Bacteria, UA Few None seen/Few  Urinalysis, Complete  Result Value Ref Range   Specific Gravity, UA <1.005 (L) 1.005 - 1.030   pH, UA 5.5 5.0 - 7.5   Color, UA Yellow Yellow   Appearance Ur Clear Clear   Leukocytes, UA Negative Negative   Protein, UA Negative Negative/Trace   Glucose, UA Trace (A) Negative   Ketones, UA Negative Negative   RBC, UA Trace (A) Negative   Bilirubin, UA Negative Negative   Urobilinogen, Ur 0.2 0.2 - 1.0 mg/dL   Nitrite, UA Negative Negative   Microscopic Examination See below:     Pertinent Imaging: CLINICAL DATA: Abdominal pain for 2 weeks with nausea. History of  pancreatitis.   EXAM:  CT ABDOMEN AND PELVIS WITH CONTRAST   TECHNIQUE:  Multidetector CT imaging of the abdomen and pelvis was performed  using the standard protocol following bolus administration of  intravenous contrast.   CONTRAST: 100 mL Omnipaque 300 IV.   COMPARISON: Most recent CT 10/27/2012   FINDINGS:  Atelectasis or scarring at the lung bases, similar to prior. Mild  cardiomegaly.   Diffusely decreased hepatic density consistent with steatosis. No  focal hepatic lesion. Gallbladder is decompressed. The spleen,  adrenal glands, and pancreas are normal. There is no peripancreatic  inflammatory change. No pancreatic ductal dilatation. The kidneys  demonstrate symmetric enhancement and excretion. No hydronephrosis  or perinephric stranding.   The stomach is physiologically distended. There are no dilated or  thickened small bowel loops. The appendix is normal. Moderate stool  throughout the colon without colonic wall thickening or inflammatory  change. No free air, free fluid, or  intra-abdominal fluid  collection.   Abdominal aorta is normal in caliber mild atherosclerosis and  tortuosity. No retroperitoneal adenopathy. Small fat containing  umbilical hernia.   Within the pelvis the bladder is physiologically distended the  uterus and adnexa are normal for age. There is no pelvic free fluid.  No pelvic adenopathy. No inguinal hernia. There are multiple pelvic  phleboliths.   There are no acute or suspicious osseous abnormalities. Degenerative  change throughout the spine, most significant in the lower lumbar  spine.   IMPRESSION:  1. No acute abnormality in the abdomen/pelvis. Particularly, no  findings of pancreatic inflammation.  2. Mild hepatic steatosis. Tiny fat containing umbilical hernia.    Electronically Signed   By: Rubye Oaks M.D.   On: 07/10/2014 22:57          Assessment & Plan:     1. Incontinence:   Patient does not complain of urge incontinence.  She does state that she wets herself upon wakening.  She was found to have a cystocele, she will be referred to gynecology to see if the pessary area with the aid with her morning time incontinence.  - Urinalysis, Complete - CULTURE, URINE COMPREHENSIVE  2. Microscopic hematuria:   If urine culture is  negative, we will pursue a hematuria work up.  Explained to patient the causes of blood in the urine are as follows: stones, UTI's, damage to the urinary tract and/or cancer.  It is explained to the patient that they will be scheduled for a CT Urogram with contrast material and that in rare instances, an allergic reaction can be serious and even life threatening with the injection of contrast material.   The patient denies any allergies to contrast or iodine, but she is allergic to seafood and is not taking metformin. She did have a CT of the abdomen and pelvis with contrast last year without issue.   I have sent an allergy prep to her pharmacy.  I have  explained to the patient that they will  be scheduled for a cystoscopy in our office to evaluate their bladder.  The cystoscopy consists of passing a tube with a lens up through their urethra and into their urinary bladder.   We will inject the urethra with a lidocaine gel prior to introducing the cystoscope to help with any discomfort during the procedure.   After the procedure, they might experience blood in the urine and discomfort with urination.  This will abate after the first few voids.  I have  encouraged the patient to increase water intake  during this time.  Patient denies any allergies to lidocaine.    - Urinalysis, Complete - CULTURE, URINE COMPREHENSIVE - BUN+Creat  3. Cystocele:   Patient will be referred to gynecology for a pessary fitting.   Return for refer to GYN for pessary fitting.  These notes generated with voice recognition software. I apologize for typographical errors.  Michiel CowboySHANNON Nakhia Levitan, PA-C  [facility] [address], [address] [place], [place] [address] [phone]

## 2015-08-07 LAB — BUN+CREAT
BUN / CREAT RATIO: 12 (ref 9–23)
BUN: 14 mg/dL (ref 6–24)
Creatinine, Ser: 1.18 mg/dL — ABNORMAL HIGH (ref 0.57–1.00)
GFR calc Af Amer: 60 mL/min/{1.73_m2} (ref >59)
GFR calc non Af Amer: 52 mL/min/{1.73_m2} — ABNORMAL LOW (ref >59)

## 2015-08-08 LAB — CULTURE, URINE COMPREHENSIVE

## 2015-08-10 ENCOUNTER — Telehealth: Payer: Self-pay | Admitting: Urology

## 2015-08-10 ENCOUNTER — Other Ambulatory Visit: Payer: Self-pay | Admitting: Urology

## 2015-08-10 DIAGNOSIS — R3129 Other microscopic hematuria: Secondary | ICD-10-CM

## 2015-08-10 MED ORDER — DIPHENHYDRAMINE HCL 50 MG PO TABS: ORAL_TABLET | ORAL | Status: DC

## 2015-08-10 MED ORDER — RANITIDINE HCL 150 MG PO TABS: ORAL_TABLET | ORAL | Status: DC

## 2015-08-10 MED ORDER — PREDNISONE 50 MG PO TABS: ORAL_TABLET | ORAL | Status: DC

## 2015-08-10 NOTE — Telephone Encounter (Signed)
Would you send her note to Veneda Melterhristina M White, FNP?

## 2015-08-10 NOTE — Telephone Encounter (Signed)
Waiting on patient to cb to schd

## 2015-08-17 NOTE — Telephone Encounter (Signed)
done

## 2015-08-21 ENCOUNTER — Ambulatory Visit
Admission: RE | Admit: 2015-08-21 | Discharge: 2015-08-21 | Disposition: A | Discharge status: Home / Self Care | Payer: Medicare Other | Source: Ambulatory Visit | Attending: Urology | Admitting: Urology

## 2015-08-21 DIAGNOSIS — K760 Fatty (change of) liver, not elsewhere classified: Secondary | ICD-10-CM | POA: Diagnosis not present

## 2015-08-21 DIAGNOSIS — K439 Ventral hernia without obstruction or gangrene: Secondary | ICD-10-CM | POA: Diagnosis not present

## 2015-08-21 DIAGNOSIS — R3129 Other microscopic hematuria: Secondary | ICD-10-CM

## 2015-08-21 DIAGNOSIS — I700 Atherosclerosis of aorta: Secondary | ICD-10-CM | POA: Insufficient documentation

## 2015-08-21 DIAGNOSIS — M47816 Spondylosis without myelopathy or radiculopathy, lumbar region: Secondary | ICD-10-CM | POA: Insufficient documentation

## 2015-08-21 DIAGNOSIS — M5137 Other intervertebral disc degeneration, lumbosacral region: Secondary | ICD-10-CM | POA: Insufficient documentation

## 2015-08-21 HISTORY — DX: Unspecified asthma, uncomplicated: J45.909

## 2015-08-21 IMAGING — CT CT ABD-PEL WO/W CM
2 of 10 series · 10 of 46 positions shown, 16 images · IV contrast (iopamidol)
Comparison: [DATE]

CLINICAL DATA: Right lower quadrant abdominal pain for 2 weeks.
Microscopic hematuria. Diabetes.

EXAM:
CT ABDOMEN AND PELVIS WITHOUT AND WITH CONTRAST
TECHNIQUE: Multidetector CT imaging of the abdomen and pelvis was performed
following the standard protocol before and following the bolus
administration of intravenous contrast.
CONTRAST:  125mL [43] IOPAMIDOL ([43]) INJECTION 61%

[Series 5: coronal · coronal · 0.84mm/px · 2 of 166 slices shown, 3 images]
[im 56/166  soft-tissue]
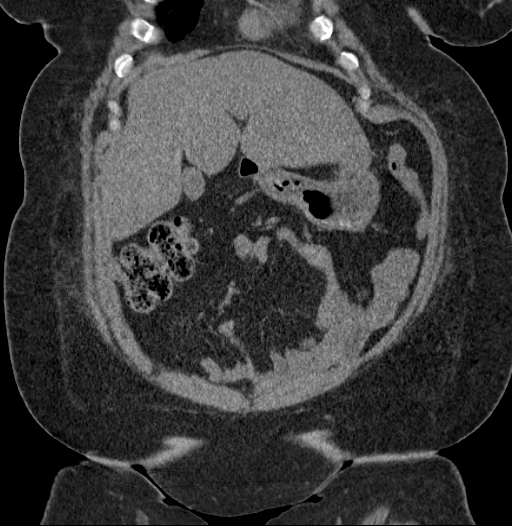
[im 56/166  bone]
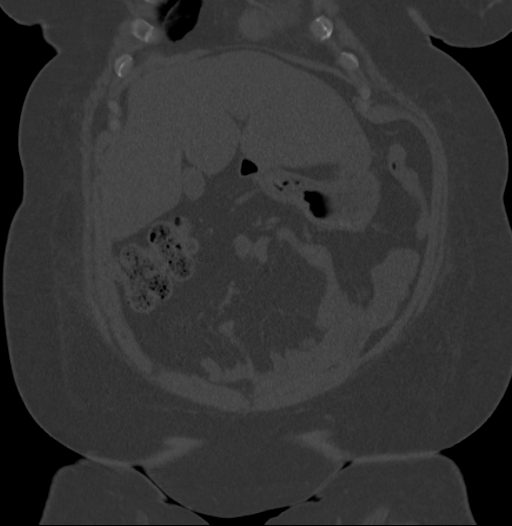
[im 111/166  soft-tissue]
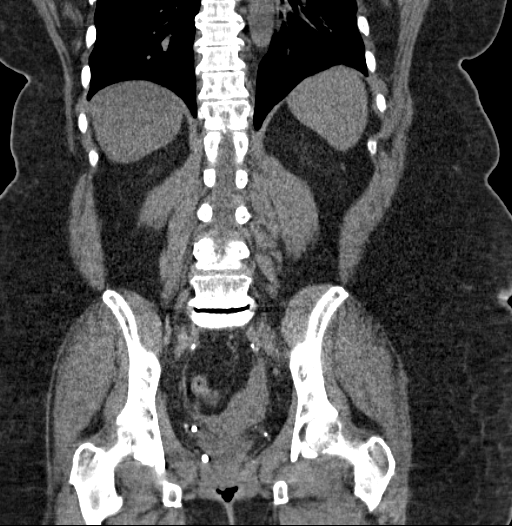

[Series 12: soft tissue delay · axial · delayed · 0.78mm/px · z∈[-514,-134]mm · 8 of 98 slices shown, 13 images]
[im 11/98  soft-tissue]
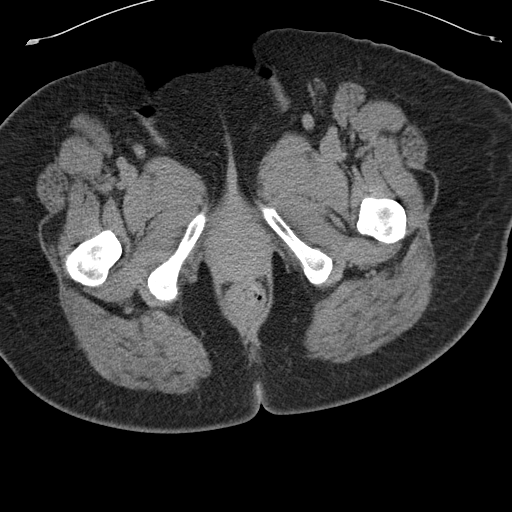
[im 11/98  bone]
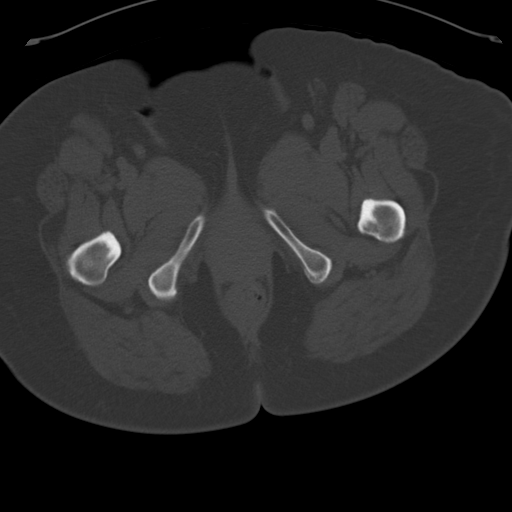
[im 22/98  soft-tissue]
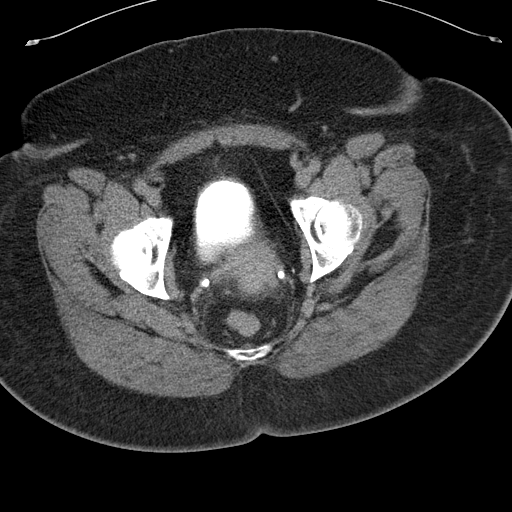
[im 33/98  soft-tissue]
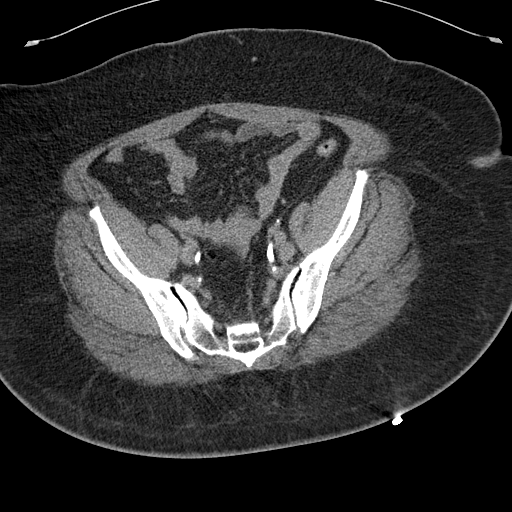
[im 44/98  soft-tissue]
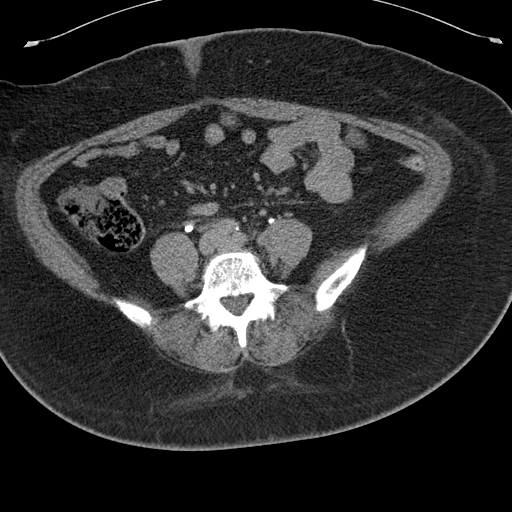
[im 54/98  soft-tissue]
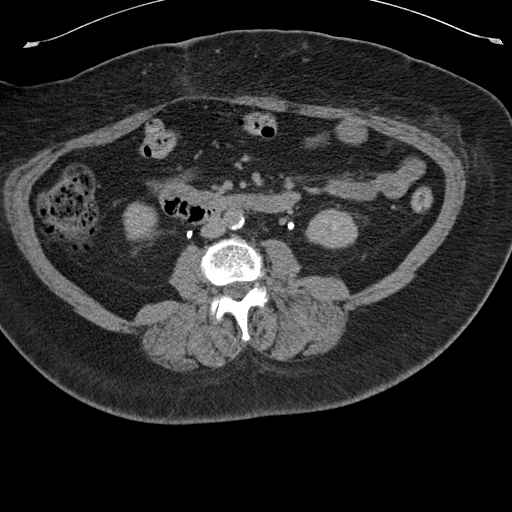
[im 54/98  lung]
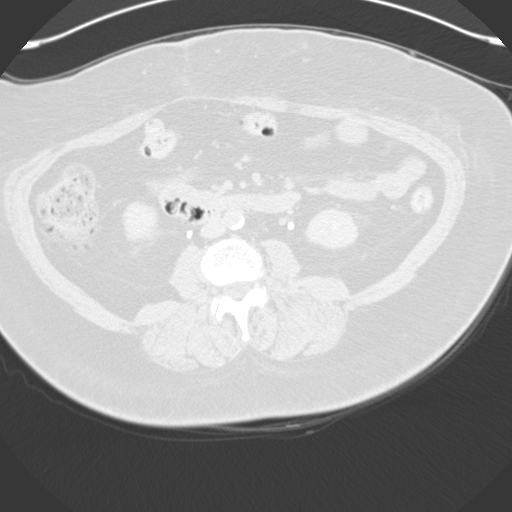
[im 65/98  soft-tissue]
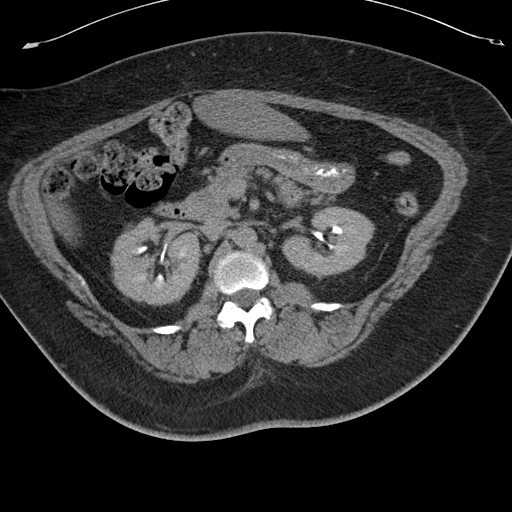
[im 65/98  lung]
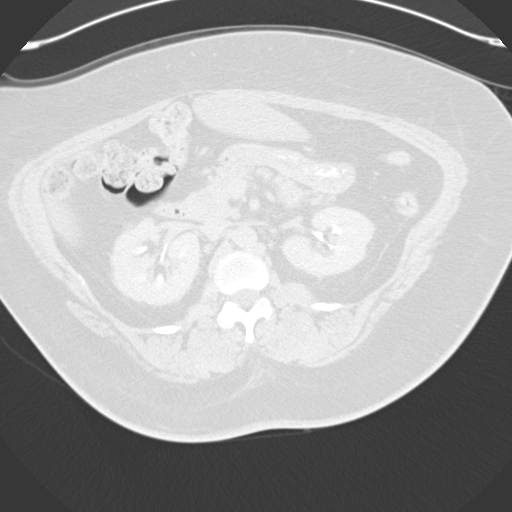
[im 76/98  soft-tissue]
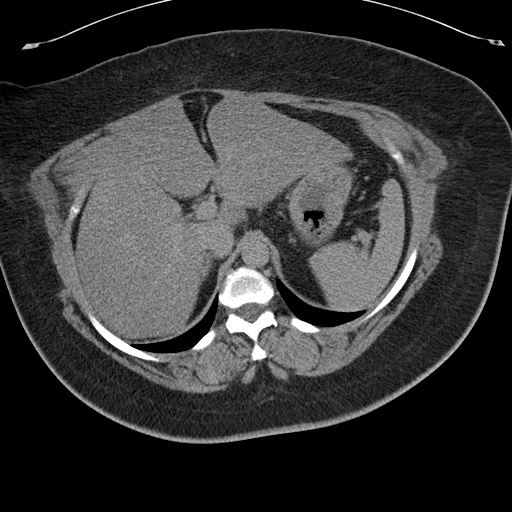
[im 76/98  lung]
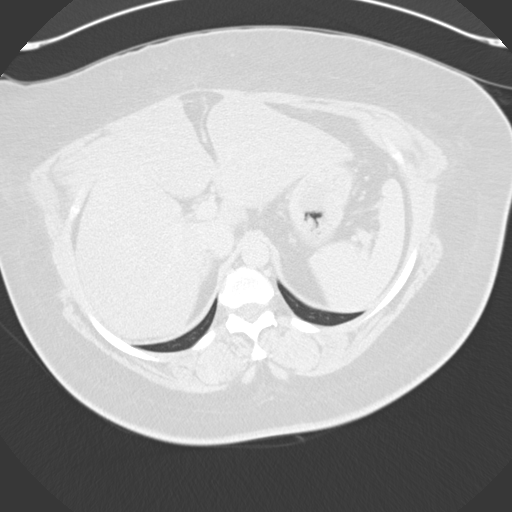
[im 87/98  soft-tissue]
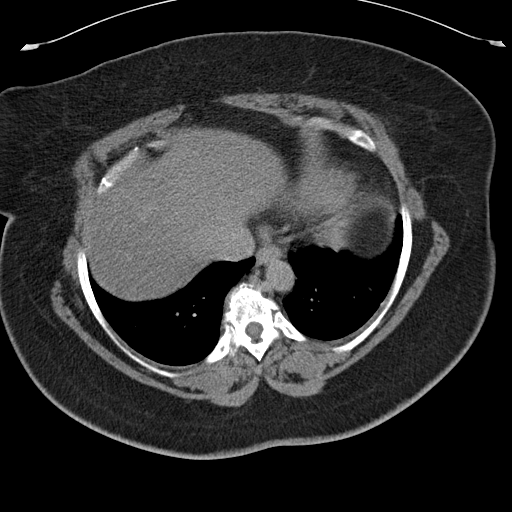
[im 87/98  lung]
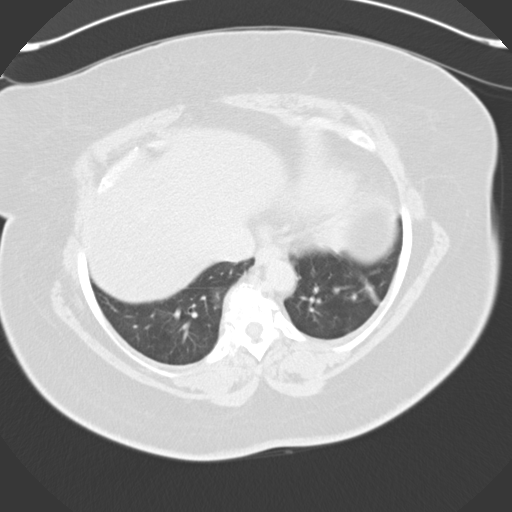

[10 of 46 positions shown; findings below may reference images not displayed]

FINDINGS: Body habitus reduces diagnostic sensitivity and specificity.

Lower chest: Mild bibasilar scarring, similar pattern to [DATE].
Mild cardiomegaly.

Hepatobiliary: Diffuse hepatic steatosis.

Pancreas: Unremarkable

Spleen: Unremarkable

Adrenals/Urinary Tract: Adrenal glands normal. No urinary tract
calculi, abnormal renal parenchymal enhancement, or abnormal
urographic phase filling defect along the urothelium to explain the
patient's hematuria. Numerous calcifications are present in the
pelvis but no ureteral calcification is identified.

Stomach/Bowel: Unremarkable.  Appendix normal.

Vascular/Lymphatic: Aortoiliac atherosclerotic vascular disease.
Small retroperitoneal lymph nodes are not pathologically enlarged by
size criteria.

Reproductive: Unremarkable

Other: No supplemental non-categorized findings.

Musculoskeletal: Sub xiphoid and adjacent upper abdominal ventral
hernia both contain adipose tissue.

Lower lumbar spondylosis and degenerative disc disease with grade 1
degenerative anterolisthesis at L4-5 and suspected foraminal
impingement bilaterally at L5-S1 due to spurring and degenerative
disc disease.
IMPRESSION: 1. No urinary tract calculi, abnormal renal parenchymal enhancement,
or abnormal urographic phase filling defect along the urothelium to
explain the patient's hematuria. The appendix appears normal.
2. Sub xiphoid and adjacent upper abdominal ventral hernias contain
adipose tissue.
3. Lumbar spondylosis and degenerative disc disease causing
foraminal impingement at L5-S1.
4.  Aortoiliac atherosclerotic vascular disease.
5. Diffuse hepatic steatosis.

## 2015-08-21 MED ORDER — IOPAMIDOL (ISOVUE-300) INJECTION 61%
125.0000 mL | Freq: Once | INTRAVENOUS | Status: AC | PRN
  Administered 2015-08-21: 125 mL via INTRAVENOUS

## 2015-08-21 MED ORDER — IOPAMIDOL (ISOVUE-300) INJECTION 61%: 100.0000 mL | Freq: Once | INTRAVENOUS | Status: DC | PRN

## 2015-08-23 IMAGING — US US EXTREM LOW VENOUS BILAT
1 series · 13 of 24 positions shown · non-contrast
Comparison: [DATE]

CLINICAL DATA: Bilateral lower extremity pain



[Series 1: us extrem low venous bilat · 0.08mm/px · 13 of 63 slices shown]
[im 1/63]
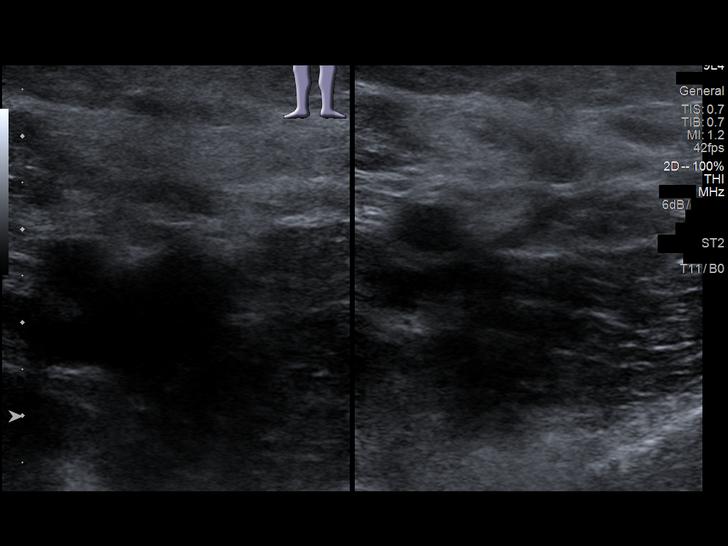
[im 6/63]
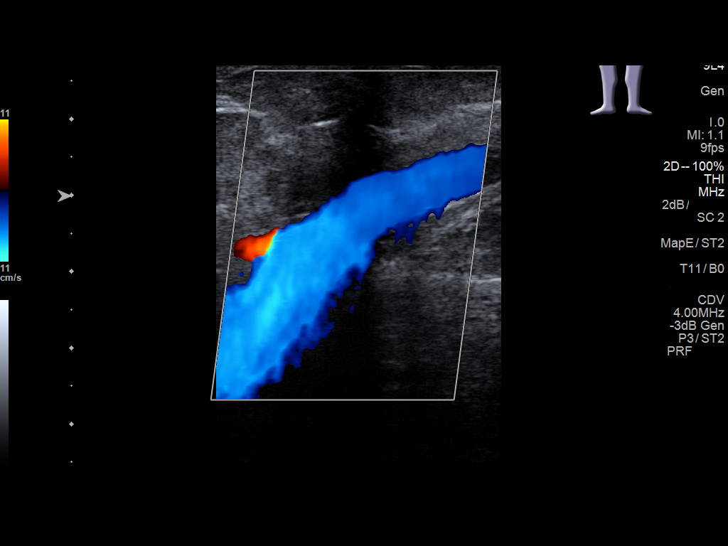
[im 11/63]
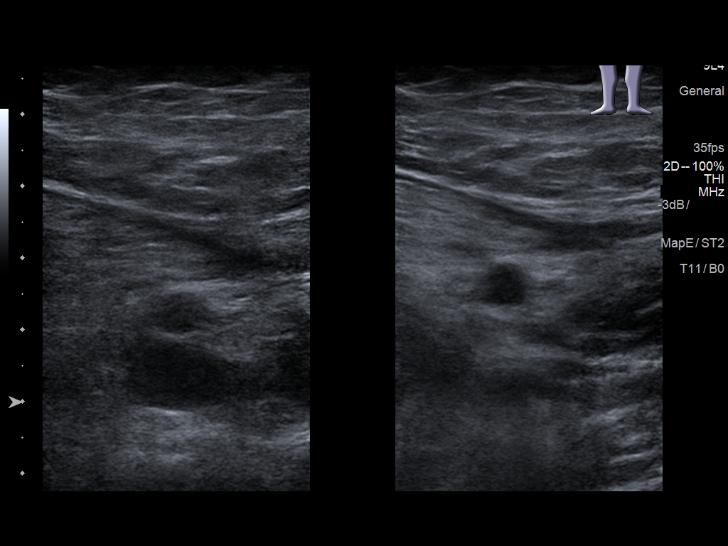
[im 17/63]
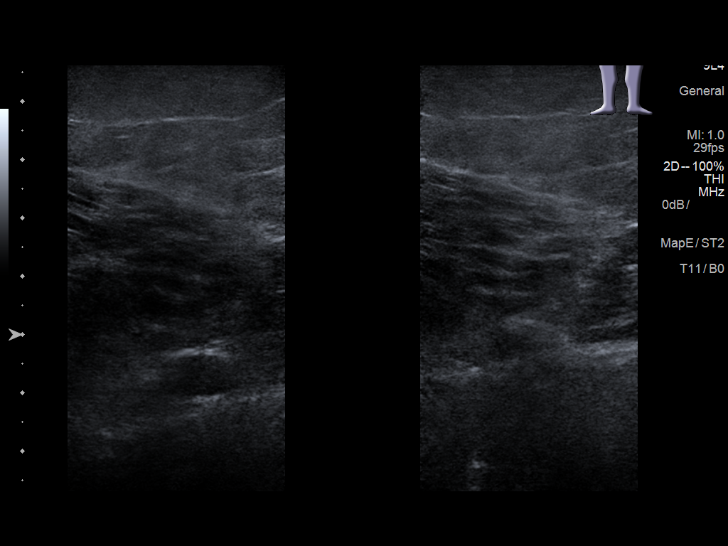
[im 22/63]
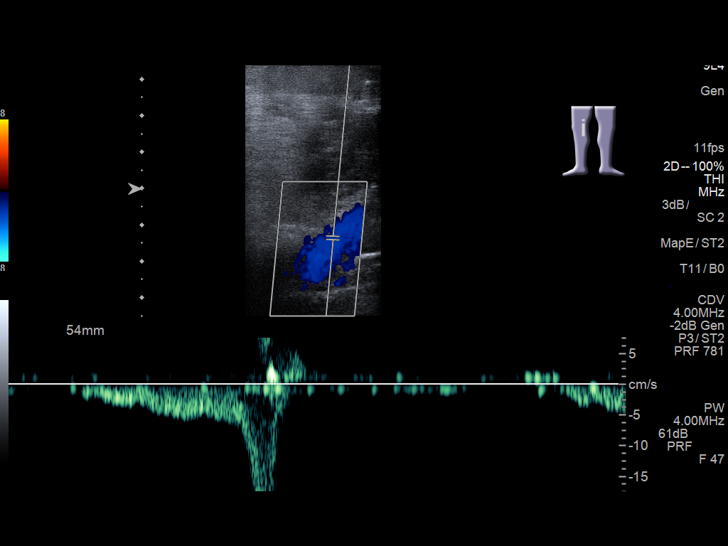
[im 27/63]
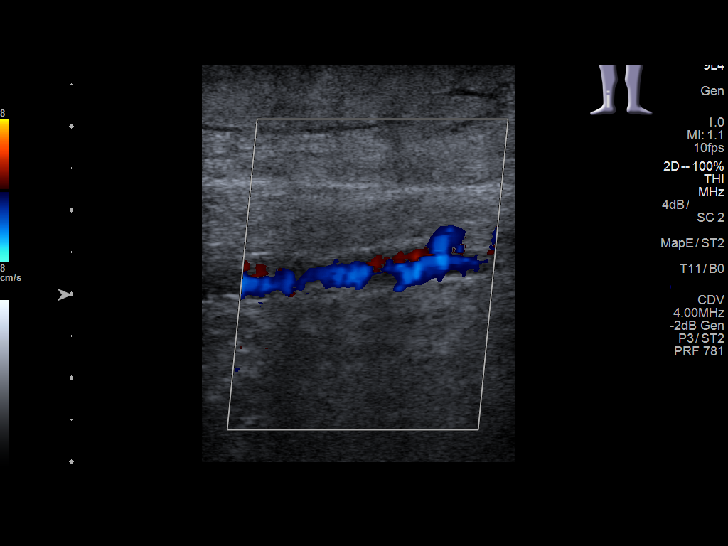
[im 33/63]
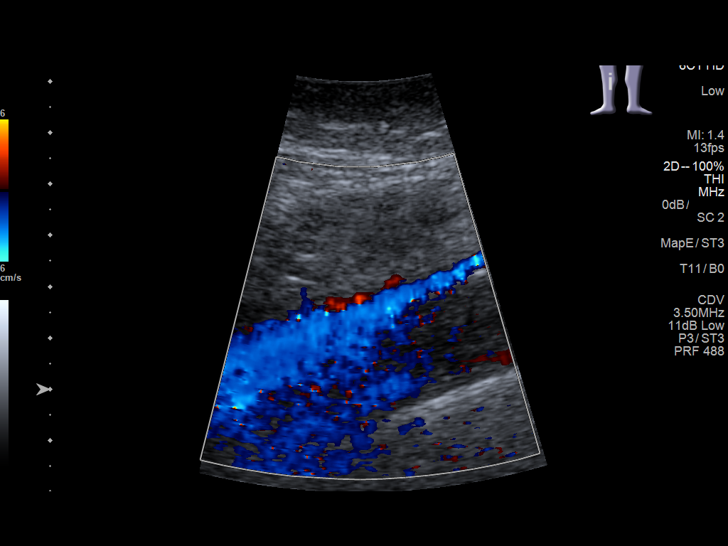
[im 36/63]
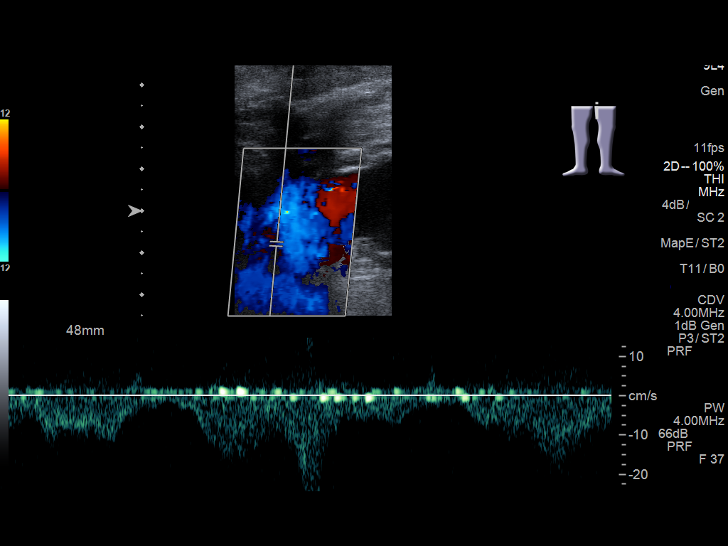
[im 41/63]
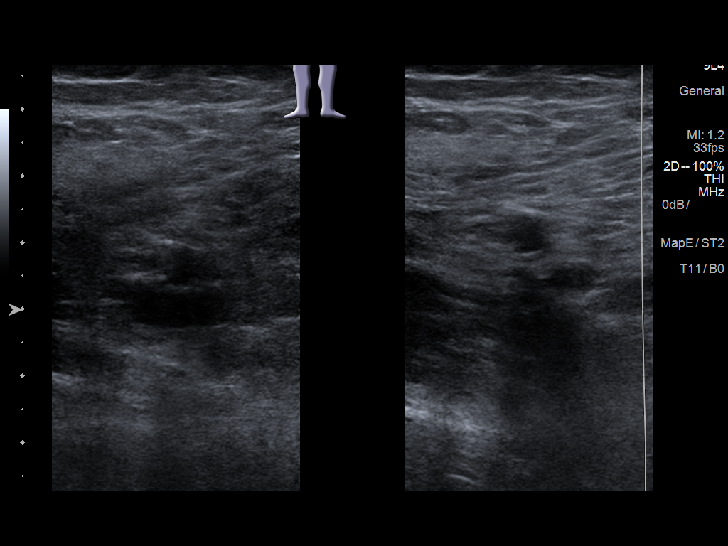
[im 46/63]
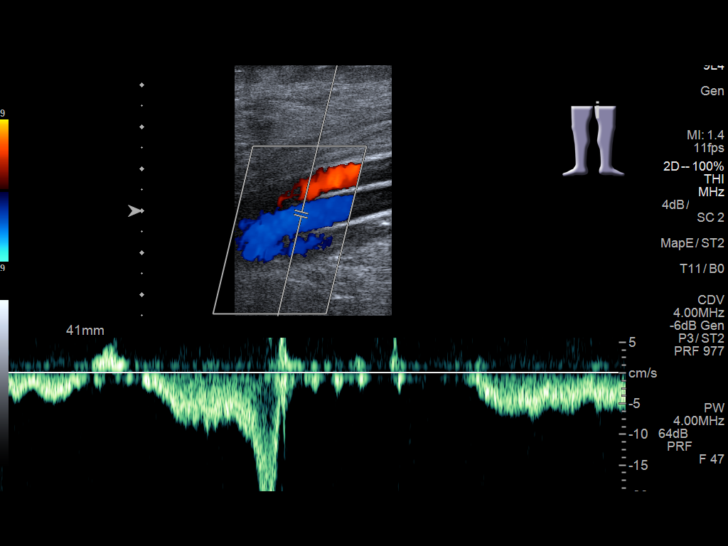
[im 52/63]
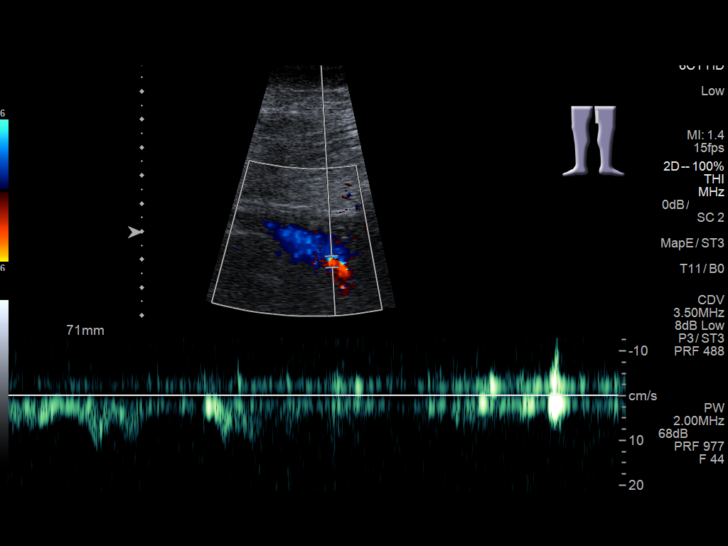
[im 57/63]
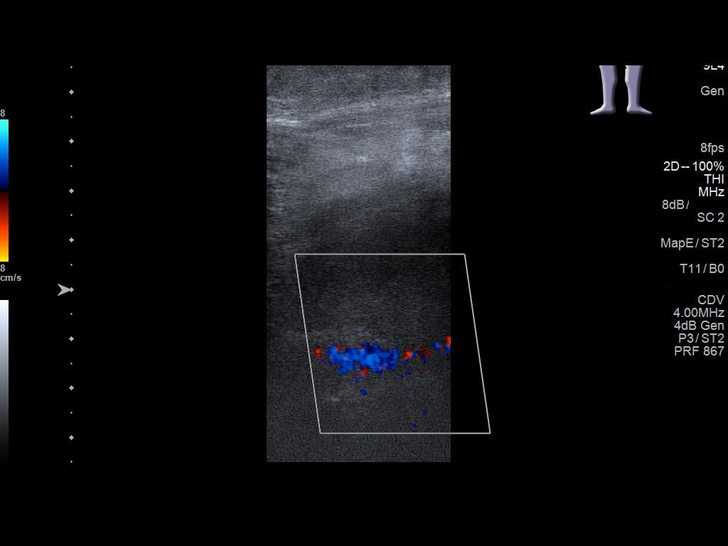
[im 63/63]
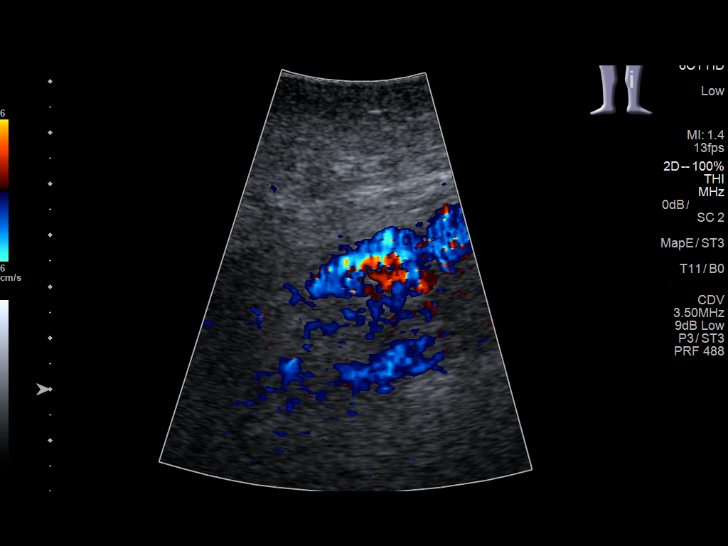

[13 of 24 positions shown; findings below may reference images not displayed]

FINDINGS: RIGHT LOWER EXTREMITY

Common Femoral Vein: No evidence of thrombus. Normal
compressibility, respiratory phasicity and response to augmentation.

Saphenofemoral Junction: No evidence of thrombus. Normal
compressibility and flow on color Doppler imaging.

Profunda Femoral Vein: No evidence of thrombus. Normal
compressibility and flow on color Doppler imaging.

Femoral Vein: No evidence of thrombus. Normal compressibility,
respiratory phasicity and response to augmentation.

Popliteal Vein: No evidence of thrombus. Normal compressibility,
respiratory phasicity and response to augmentation.

Calf Veins: No evidence of thrombus. Normal compressibility and flow
on color Doppler imaging.

Superficial Great Saphenous Vein: No evidence of thrombus. Normal
compressibility.

Venous Reflux:  None.

Other Findings:  Calf edema is noted.

LEFT LOWER EXTREMITY

Common Femoral Vein: No evidence of thrombus. Normal
compressibility, respiratory phasicity and response to augmentation.

Saphenofemoral Junction: No evidence of thrombus. Normal
compressibility and flow on color Doppler imaging.

Profunda Femoral Vein: No evidence of thrombus. Normal
compressibility and flow on color Doppler imaging.

Femoral Vein: No evidence of thrombus. Normal compressibility,
respiratory phasicity and response to augmentation.

Popliteal Vein: No evidence of thrombus. Normal compressibility,
respiratory phasicity and response to augmentation.

Calf Veins: No evidence of thrombus. Normal compressibility and flow
on color Doppler imaging.

Superficial Great Saphenous Vein: No evidence of thrombus. Normal
compressibility.

Venous Reflux:  None.

Other Findings:  Calf edema is noted.
IMPRESSION: No evidence of deep venous thrombosis. The peroneal veins are not
well visualized bilaterally.

## 2015-08-27 ENCOUNTER — Telehealth: Payer: Self-pay | Admitting: Urology

## 2015-08-27 NOTE — Telephone Encounter (Signed)
Patient needs an appointment for a CT Urogram report and cystoscopy to finish her hematuria workup.

## 2015-08-27 NOTE — Telephone Encounter (Signed)
Spoke with patient and made her appt   Teresa DusterMichelle

## 2015-09-10 ENCOUNTER — Ambulatory Visit (INDEPENDENT_AMBULATORY_CARE_PROVIDER_SITE_OTHER): Payer: Medicare Other | Admitting: Urology

## 2015-09-10 DIAGNOSIS — R3129 Other microscopic hematuria: Secondary | ICD-10-CM | POA: Diagnosis not present

## 2015-09-10 LAB — MICROSCOPIC EXAMINATION

## 2015-09-10 LAB — URINALYSIS, COMPLETE
Bilirubin, UA: NEGATIVE
GLUCOSE, UA: NEGATIVE
Ketones, UA: NEGATIVE
LEUKOCYTES UA: NEGATIVE
Nitrite, UA: NEGATIVE
PH UA: 6.5 (ref 5.0–7.5)
PROTEIN UA: NEGATIVE
Specific Gravity, UA: <1.005 — ABNORMAL LOW (ref 1.005–1.030)
UUROB: 0.2 mg/dL (ref 0.2–1.0)

## 2015-09-10 MED ORDER — LIDOCAINE HCL 2 % EX GEL
1.0000 "application " | Freq: Once | CUTANEOUS | Status: AC
  Administered 2015-09-10: 1 via URETHRAL

## 2015-09-10 MED ORDER — CIPROFLOXACIN HCL 500 MG PO TABS
500.0000 mg | ORAL_TABLET | Freq: Once | ORAL | Status: AC
  Administered 2015-09-10: 500 mg via ORAL

## 2015-09-10 NOTE — Progress Notes (Signed)
09/10/2015 12:19 PM   Teresa [name] 1960-08-07 [id number]  Referring provider: Veneda Melter, FNP [address] [place], [place] [address]  Chief Complaint  Patient presents with  . Cysto    HPI: Patient is a 55 year old African-American female who is referred by her primary care provider, Veneda Melter, FNP, for microscopic hematuria.  Patient states that she has not had episodes of gross hematuria. She states that she has been told she has blood in her urine. I do not have a microscopic urinalysis report available to me at this visit. I do have a dipstick urinalysis that was positive for blood with the referral notes. I did find 2 microscopic urinalyses from hospitalizations that did contain red blood cells. An UA from 12/06/2014 noted 0-5 RBCs per high power field. An UA from 01/01/2015 noted 0-5 RBCs per high-power field.  She does not have a prior history of nephrolithiasis, trauma to the genitourinary tract or malignancies of the genitourinary tract. She states she does have a prior history of urinary tract infections.   She does not have a family medical history of nephrolithiasis, malignancies of the genitourinary tract or hematuria.   She did undergo a CT of the abdomen and pelvis with contrast on 07/10/2014. No acute abnormalities in the abdomen or pelvis were noted.  She is a smoker.    CT urogram was unremarkable for source of hematuria.  PMH: Past Medical History  Diagnosis Date  . Diabetes (HCC)   . COPD (chronic obstructive pulmonary disease) (HCC)   . Sleep apnea   . Hypertension   . Pancreatitis   . Microscopic hematuria   . Chest pain   . Urinary frequency   . Impetigo   . Chronic kidney disease   . Diabetic retinopathy (HCC)   . Urinary incontinence   . DDD (degenerative disc disease), lumbar   . Chronic back pain   . Ventricular septal defect   . Bilateral leg edema   . Genital herpes   . Obesity, morbid (HCC)   . CAD (coronary  artery disease)   . HLD (hyperlipidemia)   . Anxiety disorder   . Peripheral neuropathy (HCC)   . Depression   . Heart burn   . Arthritis   . Heart disease   . Heart murmur   . Hepatitis   . Thyroid disease   . Asthma     Surgical History: Past Surgical History  Procedure Laterality Date  . Cardiac surgery    . Wrist surgery Bilateral   . Cyst excision      Home Medications:    Medication List       This list is accurate as of: 09/10/15 12:19 PM.  Always use your most recent med list.               acyclovir 400 MG tablet  Commonly known as:  ZOVIRAX  Take 400 mg by mouth 2 (two) times daily.     albuterol 108 (90 Base) MCG/ACT inhaler  Commonly known as:  PROVENTIL HFA;VENTOLIN HFA  Inhale 1-2 puffs into the lungs every 6 (six) hours as needed for wheezing or shortness of breath.     ALPRAZolam 1 MG tablet  Commonly known as:  XANAX  Take 1 mg by mouth 3 (three) times daily as needed for anxiety. 3 to 4 times daily PRN     amLODipine 5 MG tablet  Commonly known as:  NORVASC  Take 5 mg by mouth daily.  ANORO ELLIPTA 62.5-25 MCG/INH Aepb  Generic drug:  umeclidinium-vilanterol  Inhale into the lungs.     aspirin 81 MG tablet  Take 1 tablet (81 mg total) by mouth daily.     atorvastatin 20 MG tablet  Commonly known as:  LIPITOR  Take 20 mg by mouth at bedtime.     baclofen 10 MG tablet  Commonly known as:  LIORESAL  Take 10 mg by mouth.     enalapril 5 MG tablet  Commonly known as:  VASOTEC  Take 5 mg by mouth daily.     famotidine 20 MG tablet  Commonly known as:  PEPCID  Take 20 mg by mouth daily. Reported on 09/10/2015     FLUoxetine HCl 60 MG Tabs  Take 60 mg by mouth daily.     fluticasone 50 MCG/ACT nasal spray  Commonly known as:  FLONASE     furosemide 40 MG tablet  Commonly known as:  LASIX  Take 40 mg by mouth daily.     gabapentin 300 MG capsule  Commonly known as:  NEURONTIN  Take by mouth 3 (three) times daily.      insulin lispro 100 UNIT/ML injection  Commonly known as:  HUMALOG  Inject 2 Units into the skin 3 (three) times daily with meals. Reported on 09/10/2015     LANTUS 100 UNIT/ML injection  Generic drug:  insulin glargine  Inject into the skin.     metoprolol tartrate 25 MG tablet  Commonly known as:  LOPRESSOR  Take 25 mg by mouth 2 (two) times daily. Reported on 08/06/2015     orphenadrine 100 MG tablet  Commonly known as:  NORFLEX  Take 100 mg by mouth 2 (two) times daily as needed for muscle spasms. Reported on 08/06/2015     oxybutynin 5 MG 24 hr tablet  Commonly known as:  DITROPAN-XL  take 1 tablet by mouth once daily for BLADDER URGENCY     oxyCODONE-acetaminophen 5-325 MG tablet  Commonly known as:  PERCOCET/ROXICET  Take 1 tablet by mouth every 6 (six) hours as needed for severe pain.     OXYGEN  Inhale into the lungs.     traZODone 100 MG tablet  Commonly known as:  DESYREL  Take by mouth.        Allergies:  Allergies  Allergen Reactions  . Sulfa Antibiotics Shortness Of Breath  . Amoxicillin Other (See Comments)    Body shakes  . Penicillins Other (See Comments)    Body shakes  . Shellfish Allergy Swelling    Family History: Family History  Problem Relation Age of Onset  . Diabetes      2 Brother 2 Sister  . Hypertension    . Kidney disease Neg Hx   . Bladder Cancer Neg Hx     Social History:  reports that she has been smoking Cigarettes.  She does not have any smokeless tobacco history on file. She reports that she does not drink alcohol or use illicit drugs.  ROS:                                        Physical Exam: There were no vitals taken for this visit.  Constitutional:  Alert and oriented, No acute distress. HEENT: [place] AT, moist mucus membranes.  Trachea midline, no masses. Cardiovascular: No clubbing, cyanosis, or edema. Respiratory: Normal respiratory effort, no increased work  of breathing. GI: Abdomen is soft,  nontender, nondistended, no abdominal masses GU: No CVA tenderness.  Skin: No rashes, bruises or suspicious lesions. Lymph: No cervical or inguinal adenopathy. Neurologic: Grossly intact, no focal deficits, moving all 4 extremities. Psychiatric: Normal mood and affect.  Laboratory Data: Lab Results  Component Value Date   WBC 9.5 01/01/2015   HGB 11.3* 01/01/2015   HCT 34.4* 01/01/2015   MCV 91.9 01/01/2015   PLT 292 01/01/2015    Lab Results  Component Value Date   CREATININE 1.18* 08/06/2015    No results found for: PSA  No results found for: TESTOSTERONE  Lab Results  Component Value Date   HGBA1C 6.5* 09/08/2012    Urinalysis    Component Value Date/Time   COLORURINE YELLOW* 01/01/2015 1557   COLORURINE Yellow 07/10/2014 2231   APPEARANCEUR Clear 08/06/2015 1132   APPEARANCEUR HAZY* 01/01/2015 1557   APPEARANCEUR Clear 07/10/2014 2231   LABSPEC 1.010 01/01/2015 1557   LABSPEC 1.018 07/10/2014 2231   PHURINE 6.0 01/01/2015 1557   PHURINE 6.0 07/10/2014 2231   GLUCOSEU Trace* 08/06/2015 1132   GLUCOSEU Negative 07/10/2014 2231   HGBUR NEGATIVE 01/01/2015 1557   HGBUR Negative 07/10/2014 2231   BILIRUBINUR Negative 08/06/2015 1132   BILIRUBINUR NEGATIVE 01/01/2015 1557   BILIRUBINUR Negative 07/10/2014 2231   KETONESUR NEGATIVE 01/01/2015 1557   KETONESUR Negative 07/10/2014 2231   PROTEINUR Negative 08/06/2015 1132   PROTEINUR NEGATIVE 01/01/2015 1557   PROTEINUR Negative 07/10/2014 2231   NITRITE Negative 08/06/2015 1132   NITRITE NEGATIVE 01/01/2015 1557   NITRITE Negative 07/10/2014 2231   LEUKOCYTESUR Negative 08/06/2015 1132   LEUKOCYTESUR NEGATIVE 01/01/2015 1557   LEUKOCYTESUR Trace 07/10/2014 2231    Pertinent Imaging: CLINICAL DATA: Right lower quadrant abdominal pain for 2 weeks. Microscopic hematuria. Diabetes.  EXAM: CT ABDOMEN AND PELVIS WITHOUT AND WITH CONTRAST  TECHNIQUE: Multidetector CT imaging of the abdomen and pelvis was  performed following the standard protocol before and following the bolus administration of intravenous contrast.  CONTRAST: 125mL ISOVUE-300 IOPAMIDOL (ISOVUE-300) INJECTION 61%  COMPARISON: 07/10/2014  FINDINGS: Body habitus reduces diagnostic sensitivity and specificity.  Lower chest: Mild bibasilar scarring, similar pattern to 07/10/14. Mild cardiomegaly.  Hepatobiliary: Diffuse hepatic steatosis.  Pancreas: Unremarkable  Spleen: Unremarkable  Adrenals/Urinary Tract: Adrenal glands normal. No urinary tract calculi, abnormal renal parenchymal enhancement, or abnormal urographic phase filling defect along the urothelium to explain the patient's hematuria. Numerous calcifications are present in the pelvis but no ureteral calcification is identified.  Stomach/Bowel: Unremarkable. Appendix normal.  Vascular/Lymphatic: Aortoiliac atherosclerotic vascular disease. Small retroperitoneal lymph nodes are not pathologically enlarged by size criteria.  Reproductive: Unremarkable  Other: No supplemental non-categorized findings.  Musculoskeletal: Sub xiphoid and adjacent upper abdominal ventral hernia both contain adipose tissue.  Lower lumbar spondylosis and degenerative disc disease with grade 1 degenerative anterolisthesis at L4-5 and suspected foraminal impingement bilaterally at L5-S1 due to spurring and degenerative disc disease.  IMPRESSION: 1. No urinary tract calculi, abnormal renal parenchymal enhancement, or abnormal urographic phase filling defect along the urothelium to explain the patient's hematuria. The appendix appears normal. 2. Sub xiphoid and adjacent upper abdominal ventral hernias contain adipose tissue. 3. Lumbar spondylosis and degenerative disc disease causing foraminal impingement at L5-S1. 4. Aortoiliac atherosclerotic vascular disease. 5. Diffuse hepatic steatosis.   Cystoscopy Procedure Note  Patient identification was  confirmed, informed consent was obtained, and patient was prepped using Betadine solution.  Lidocaine jelly was administered per urethral meatus.    Preoperative abx  where received prior to procedure.    Procedure: - Flexible cystoscope introduced, without any difficulty.   - Thorough search of the bladder revealed:    normal urethral meatus    normal urothelium    no stones    no ulcers     no tumors    no urethral polyps    no trabeculation  - Ureteral orifices were normal in position and appearance.  Post-Procedure: - Patient tolerated the procedure well   Assessment & Plan:    1. Microscopic Hematuria -negative work up. Follow up in one year for repeat urinalysis   Return in about 1 year (around 09/09/2016).  Hildred Laser, MD  [facility] [address] [place], [place] [address] [phone]

## 2015-09-17 ENCOUNTER — Ambulatory Visit: Payer: Medicare Other | Admitting: Obstetrics and Gynecology

## 2015-10-08 ENCOUNTER — Encounter: Payer: Self-pay | Admitting: Obstetrics and Gynecology

## 2015-10-08 ENCOUNTER — Ambulatory Visit (INDEPENDENT_AMBULATORY_CARE_PROVIDER_SITE_OTHER): Payer: Medicare Other | Admitting: Obstetrics and Gynecology

## 2015-10-08 ENCOUNTER — Telehealth: Payer: Self-pay | Admitting: Urology

## 2015-10-08 VITALS — BP 109/71 | HR 94 | Ht 62.0 in | Wt 242.0 lb

## 2015-10-08 DIAGNOSIS — Z780 Asymptomatic menopausal state: Secondary | ICD-10-CM | POA: Diagnosis not present

## 2015-10-08 DIAGNOSIS — N393 Stress incontinence (female) (male): Secondary | ICD-10-CM

## 2015-10-08 DIAGNOSIS — R350 Frequency of micturition: Secondary | ICD-10-CM

## 2015-10-08 DIAGNOSIS — N8110 Cystocele, unspecified: Secondary | ICD-10-CM | POA: Diagnosis not present

## 2015-10-08 DIAGNOSIS — IMO0002 Reserved for concepts with insufficient information to code with codable children: Secondary | ICD-10-CM

## 2015-10-08 DIAGNOSIS — N8111 Cystocele, midline: Secondary | ICD-10-CM | POA: Insufficient documentation

## 2015-10-08 LAB — POCT URINALYSIS DIPSTICK
Bilirubin, UA: NEGATIVE
Glucose, UA: NEGATIVE
KETONES UA: NEGATIVE
LEUKOCYTES UA: NEGATIVE
Nitrite, UA: NEGATIVE
PH UA: 6
PROTEIN UA: NEGATIVE
SPEC GRAV UA: 1.025
UROBILINOGEN UA: NEGATIVE

## 2015-10-08 NOTE — Telephone Encounter (Signed)
Please advise 

## 2015-10-08 NOTE — Telephone Encounter (Signed)
What type of incontinence supplies is she in need of?  We can contact [employer] if she doesn't have Medicare.  If she has Medicare, they are very limiting on what they will cover.

## 2015-10-08 NOTE — Telephone Encounter (Signed)
Pt saw Dr. Algis Downs at [facility] today.  He told her to call our office for incontinence supplies.  [phone]

## 2015-10-08 NOTE — Progress Notes (Signed)
GYN ENCOUNTER NOTE  Subjective:       Teresa [name] is a 55 y.o. No obstetric history on file. female is here for gynecologic evaluation of the following issues:  1. Symptomatic cystocele.    [facility] consideration of pessary for symptomatic cystocele management. Patient has stress incontinence. Comorbidities include diabetes mellitus, morbid obesity  Patient reports no protrusion of tissue from the vaginal introitus. She does not have urgency symptoms. She has normal bowel function with need for splinting. Patient has had 1 spontaneous vaginal delivery past.   Gynecologic History No LMP recorded. Patient is postmenopausal.   OB History  No data available  Para 1001  Past Medical History  Diagnosis Date  . Diabetes (HCC)   . COPD (chronic obstructive pulmonary disease) (HCC)   . Sleep apnea   . Hypertension   . Pancreatitis   . Microscopic hematuria   . Chest pain   . Urinary frequency   . Impetigo   . Chronic kidney disease   . Diabetic retinopathy (HCC)   . Urinary incontinence   . DDD (degenerative disc disease), lumbar   . Chronic back pain   . Ventricular septal defect   . Bilateral leg edema   . Genital herpes   . Obesity, morbid (HCC)   . CAD (coronary artery disease)   . HLD (hyperlipidemia)   . Anxiety disorder   . Peripheral neuropathy (HCC)   . Depression   . Heart burn   . Arthritis   . Heart disease   . Heart murmur   . Hepatitis   . Thyroid disease   . Asthma     Past Surgical History  Procedure Laterality Date  . Cardiac surgery    . Wrist surgery Bilateral   . Cyst excision      Current Outpatient Prescriptions on File Prior to Visit  Medication Sig Dispense Refill  . acyclovir (ZOVIRAX) 400 MG tablet Take 400 mg by mouth 2 (two) times daily.    Marland Kitchen. albuterol (PROVENTIL HFA;VENTOLIN HFA) 108 (90 BASE) MCG/ACT inhaler Inhale 1-2 puffs into the lungs every 6 (six) hours as needed for wheezing or  shortness of breath.    . ALPRAZolam (XANAX) 1 MG tablet Take 1 mg by mouth 3 (three) times daily as needed for anxiety. 3 to 4 times daily PRN    . amLODipine (NORVASC) 5 MG tablet Take 5 mg by mouth daily.    Marland Kitchen. aspirin 81 MG tablet Take 1 tablet (81 mg total) by mouth daily. 30 tablet 0  . atorvastatin (LIPITOR) 20 MG tablet Take 20 mg by mouth at bedtime.    . baclofen (LIORESAL) 10 MG tablet Take 10 mg by mouth.    . enalapril (VASOTEC) 5 MG tablet Take 5 mg by mouth daily.    . famotidine (PEPCID) 20 MG tablet Take 20 mg by mouth daily. Reported on 09/10/2015    . FLUoxetine HCl 60 MG TABS Take 60 mg by mouth daily.    . fluticasone (FLONASE) 50 MCG/ACT nasal spray   0  . furosemide (LASIX) 40 MG tablet Take 40 mg by mouth daily.    Marland Kitchen. gabapentin (NEURONTIN) 300 MG capsule Take by mouth 3 (three) times daily.  0  . insulin glargine (LANTUS) 100 UNIT/ML injection Inject into the skin.    Marland Kitchen. insulin lispro (HUMALOG) 100 UNIT/ML injection Inject 2 Units into the skin 3 (three) times daily with meals. Reported on 09/10/2015    .  metoprolol tartrate (LOPRESSOR) 25 MG tablet Take 25 mg by mouth 2 (two) times daily. Reported on 08/06/2015    . orphenadrine (NORFLEX) 100 MG tablet Take 100 mg by mouth 2 (two) times daily as needed for muscle spasms. Reported on 08/06/2015    . oxybutynin (DITROPAN-XL) 5 MG 24 hr tablet take 1 tablet by mouth once daily for BLADDER URGENCY  0  . oxyCODONE-acetaminophen (PERCOCET/ROXICET) 5-325 MG per tablet Take 1 tablet by mouth every 6 (six) hours as needed for severe pain.    . OXYGEN Inhale into the lungs.    . traZODone (DESYREL) 100 MG tablet Take by mouth.    . umeclidinium-vilanterol (ANORO ELLIPTA) 62.5-25 MCG/INH AEPB Inhale into the lungs.     Current Facility-Administered Medications on File Prior to Visit  Medication Dose Route Frequency Provider Last Rate Last Dose  . triamcinolone acetonide (KENALOG) 10 MG/ML injection 10 mg  10 mg Other Once Vivi Barrack, DPM        Allergies  Allergen Reactions  . Sulfa Antibiotics Shortness Of Breath  . Amoxicillin Other (See Comments)    Body shakes  . Penicillins Other (See Comments)    Body shakes  . Shellfish Allergy Swelling    Social History   Social History  . Marital Status: Single    Spouse Name: N/A  . Number of Children: N/A  . Years of Education: N/A   Occupational History  . Not on file.   Social History Main Topics  . Smoking status: Light Tobacco Smoker    Types: Cigarettes  . Smokeless tobacco: Not on file  . Alcohol Use: No  . Drug Use: No  . Sexual Activity: Not on file   Other Topics Concern  . Not on file   Social History Narrative    Family History  Problem Relation Age of Onset  . Diabetes      2 Brother 2 Sister  . Hypertension    . Kidney disease Neg Hx   . Bladder Cancer Neg Hx     The following portions of the patient's history were reviewed and updated as appropriate: allergies, current medications, past family history, past medical history, past social history, past surgical history and problem list.  Review of Systems Review of Systems - General ROS: negative for - chills, fatigue, fever, hot flashes, malaise or night sweats Hematological and Lymphatic ROS: negative for - bleeding problems or swollen lymph nodes Gastrointestinal ROS: negative for - abdominal pain, blood in stools, change in bowel habits and nausea/vomiting Musculoskeletal ROS: negative for - joint pain, muscle pain or muscular weakness Genito-Urinary ROS: negative for - change in menstrual cycle, dysmenorrhea, dyspareunia, dysuria, genital discharge, genital ulcers, hematuria,  irregular/heavy menses, nocturia or pelvic pain. POSITIVE-incontinence,  Objective:   BP 109/71 mmHg  Pulse 94  Ht  (1.575 m)  Wt 242 lb (109.77 kg)  BMI 44.25 kg/m2 CONSTITUTIONAL: Well-developed, well-nourished female in no acute distress.  HENT:  Normocephalic, atraumatic.  NECK:  Normal range of motion, supple, no masses.  Normal thyroid.  SKIN: Skin is warm and dry. No rash noted. Not diaphoretic. No erythema. No pallor. NEUROLGIC: Alert and oriented to person, place, and time. PSYCHIATRIC: Normal mood and affect. Normal behavior. Normal judgment and thought content. CARDIOVASCULAR:Not Examined RESPIRATORY: Not Examined BREASTS: Not Examined ABDOMEN: Soft, non distended; Non tender.  No Organomegaly.Large pannus PELVIC:  External Genitalia: Normal  BUS: Normal  Vagina: First to second-degree cystocele; no rectocele  Cervix: Normal; no  cervical motion tenderness  Uterus: Normal size, shape,consistency, mobile; positioned in mid vagina  Adnexa: Normal; nonpalpable and nontender  RV: Normal external exam  Bladder: Nontender MUSCULOSKELETAL: Normal range of motion. No tenderness.  No cyanosis, clubbing, or edema.     Assessment:   1. Urinary frequency - POCT urinalysis dipstick  2. Cystocele, second degree  3. Menopause  4. Morbid obesity  5. Stress urinary incontinence     Plan:   1. Return for pessary trial in 1-2 weeks 2. Patient is counseled regarding conservative management for stress incontinence including performing Kegel exercises.   A total of 30 minutes were spent face-to-face with the patient during the encounter with greater than 50% dealing with counseling and coordination of care.  Herold HarmsMartin A Lamis Behrmann, MD  Note: This dictation was prepared with Dragon dictation along with smaller phrase technology. Any transcriptional errors that result from this process are unintentional.

## 2015-10-08 NOTE — Patient Instructions (Addendum)
1. Return in 1-2 weeks for pessary fitting 2. Kegel exercises

## 2015-10-12 NOTE — Telephone Encounter (Signed)
Medicaid sent a form to be filled out and returned for incontinence supplies for pt.

## 2015-10-18 ENCOUNTER — Emergency Department: Payer: Medicare Other

## 2015-10-18 ENCOUNTER — Emergency Department
Admission: EM | Admit: 2015-10-18 | Discharge: 2015-10-18 | Disposition: A | Discharge status: Home / Self Care | Payer: Medicare Other | Attending: Emergency Medicine | Admitting: Emergency Medicine

## 2015-10-18 DIAGNOSIS — I2510 Atherosclerotic heart disease of native coronary artery without angina pectoris: Secondary | ICD-10-CM | POA: Insufficient documentation

## 2015-10-18 DIAGNOSIS — I129 Hypertensive chronic kidney disease with stage 1 through stage 4 chronic kidney disease, or unspecified chronic kidney disease: Secondary | ICD-10-CM | POA: Insufficient documentation

## 2015-10-18 DIAGNOSIS — Z7982 Long term (current) use of aspirin: Secondary | ICD-10-CM | POA: Diagnosis not present

## 2015-10-18 DIAGNOSIS — N189 Chronic kidney disease, unspecified: Secondary | ICD-10-CM | POA: Diagnosis not present

## 2015-10-18 DIAGNOSIS — Z79899 Other long term (current) drug therapy: Secondary | ICD-10-CM | POA: Diagnosis not present

## 2015-10-18 DIAGNOSIS — Z7984 Long term (current) use of oral hypoglycemic drugs: Secondary | ICD-10-CM | POA: Insufficient documentation

## 2015-10-18 DIAGNOSIS — E785 Hyperlipidemia, unspecified: Secondary | ICD-10-CM | POA: Insufficient documentation

## 2015-10-18 DIAGNOSIS — J449 Chronic obstructive pulmonary disease, unspecified: Secondary | ICD-10-CM | POA: Insufficient documentation

## 2015-10-18 DIAGNOSIS — M25562 Pain in left knee: Secondary | ICD-10-CM | POA: Diagnosis present

## 2015-10-18 DIAGNOSIS — F329 Major depressive disorder, single episode, unspecified: Secondary | ICD-10-CM | POA: Diagnosis not present

## 2015-10-18 DIAGNOSIS — E1122 Type 2 diabetes mellitus with diabetic chronic kidney disease: Secondary | ICD-10-CM | POA: Diagnosis not present

## 2015-10-18 DIAGNOSIS — Z794 Long term (current) use of insulin: Secondary | ICD-10-CM | POA: Diagnosis not present

## 2015-10-18 DIAGNOSIS — M7642 Tibial collateral bursitis [Pellegrini-Stieda], left leg: Secondary | ICD-10-CM | POA: Insufficient documentation

## 2015-10-18 DIAGNOSIS — F17210 Nicotine dependence, cigarettes, uncomplicated: Secondary | ICD-10-CM | POA: Insufficient documentation

## 2015-10-18 IMAGING — CR DG KNEE COMPLETE 4+V*L*
1 series · 4 of 4 positions shown · non-contrast
Comparison: [DATE]

CLINICAL DATA: Pain for 2 months.  No history of trauma

EXAM:
LEFT KNEE - COMPLETE 4+ VIEW

[Series 1: dg knee complete 4 views left · 0.14mm/px · 4 of 4 slices shown]
[im 1/4]
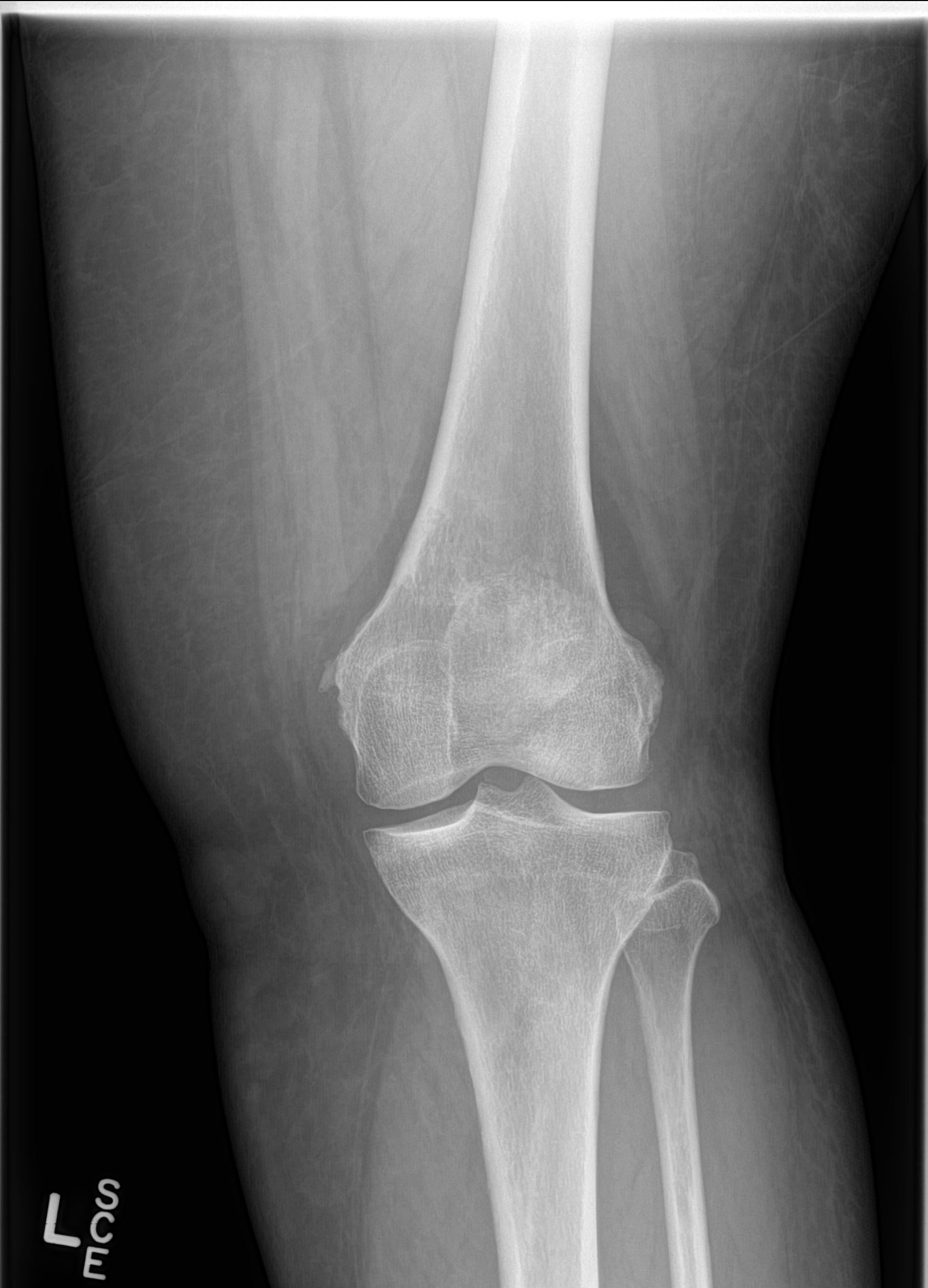
[im 2/4]
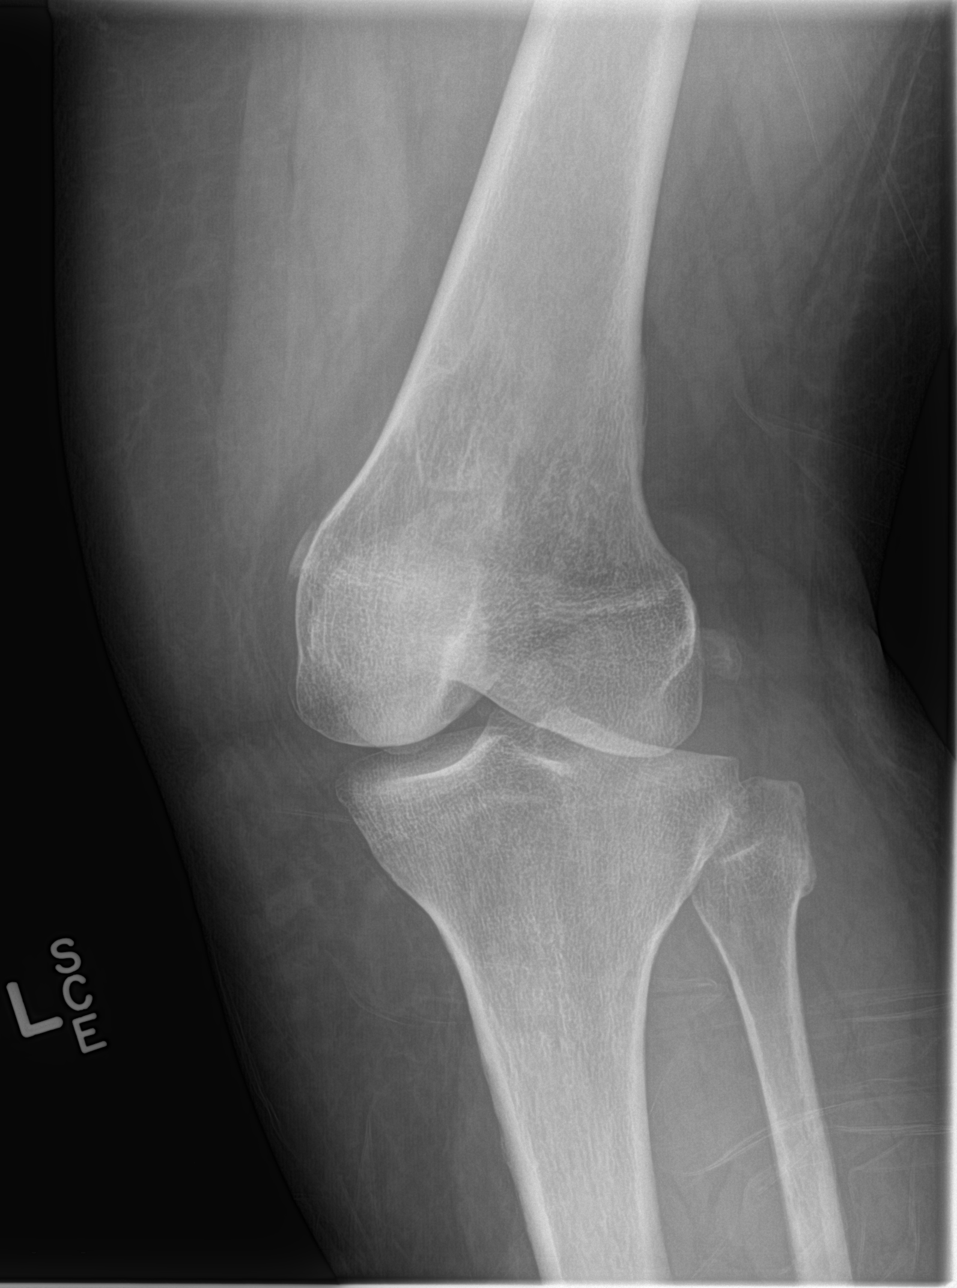
[im 3/4]
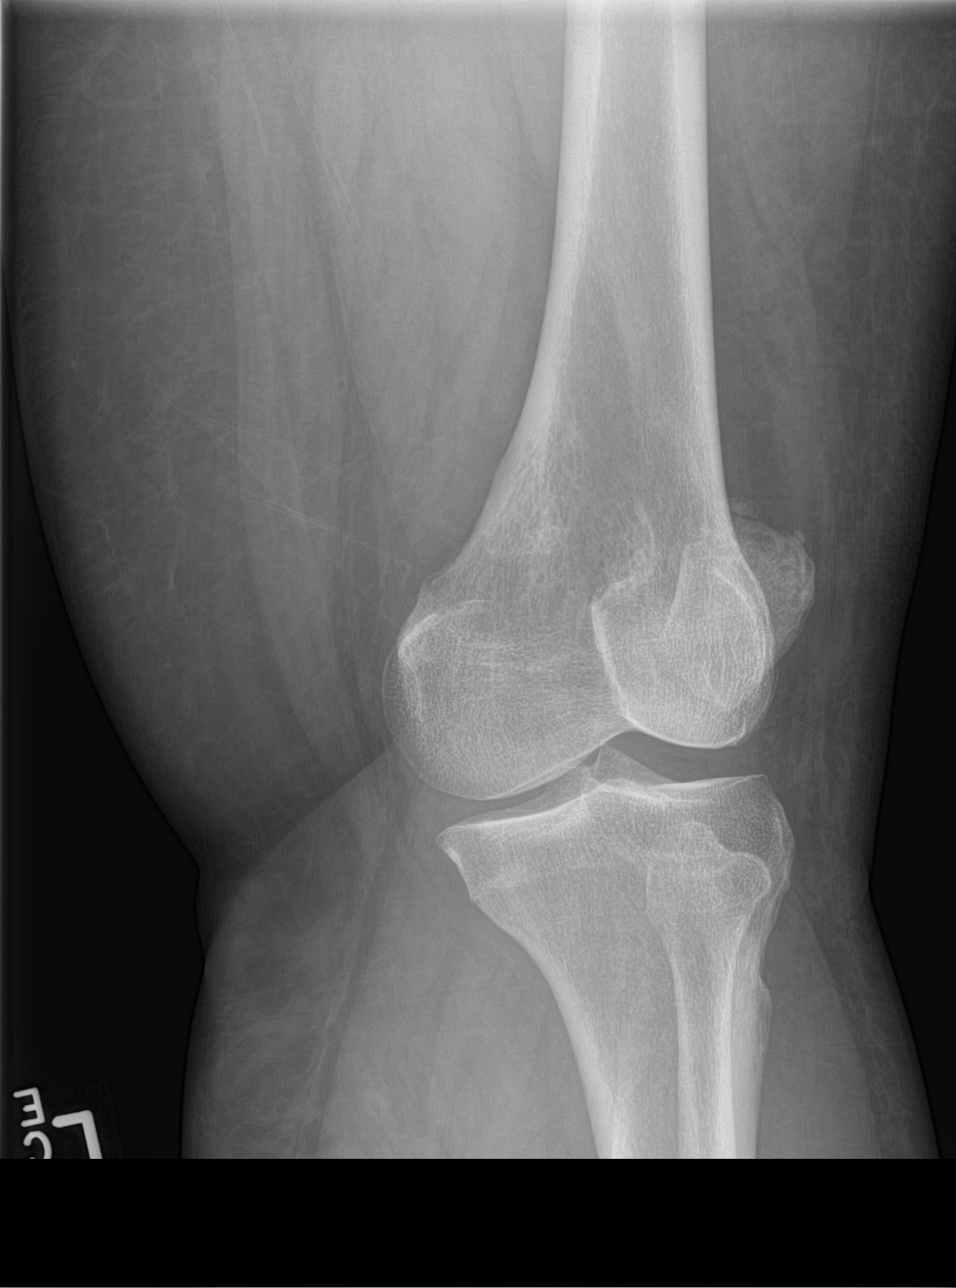
[im 4/4]
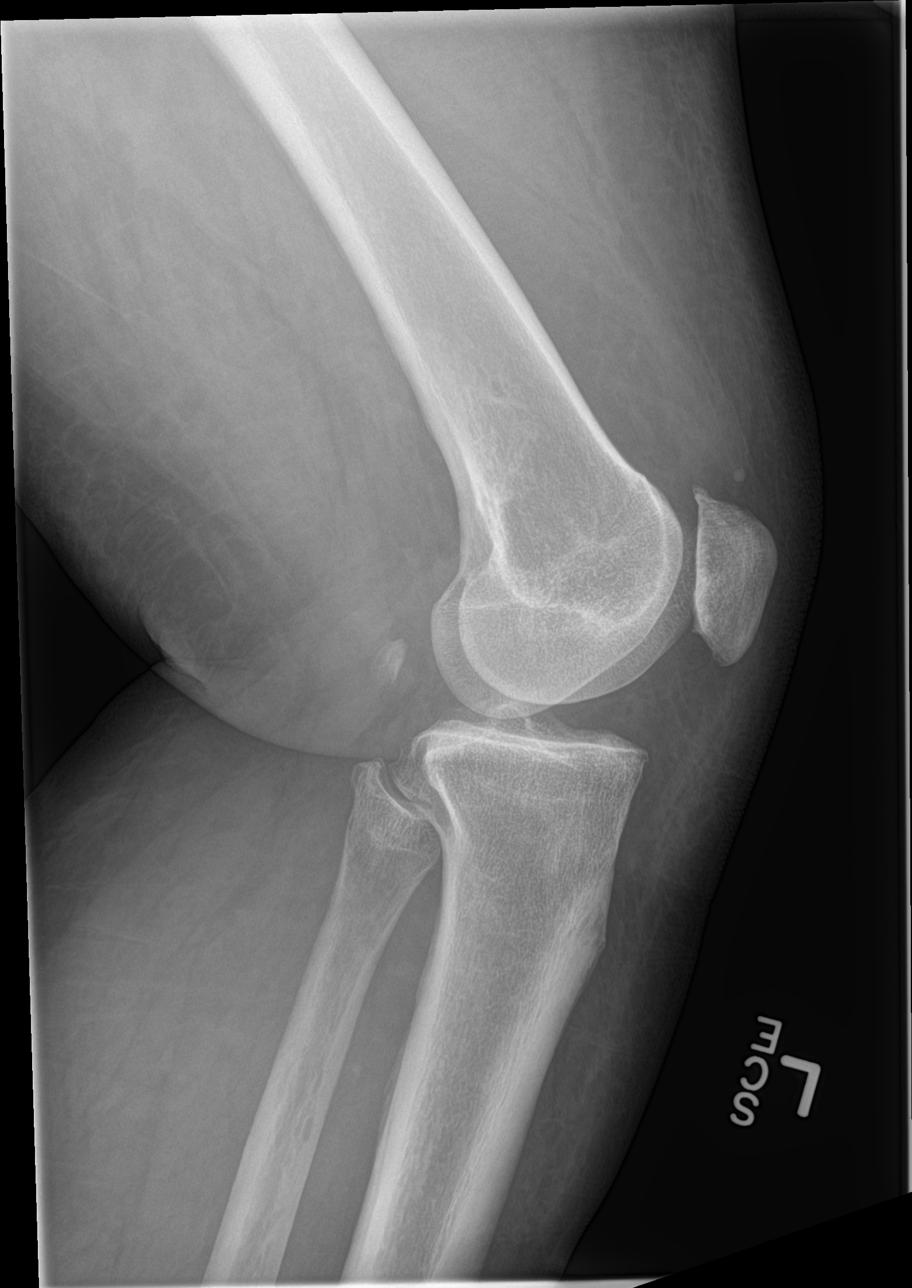

[4 of 4 positions shown; findings below may reference images not displayed]

FINDINGS: Frontal, lateral, and bilateral oblique views were obtained. There
is no appreciable fracture or dislocation. No joint effusion. There
is mild spurring along the posterior patella. There is calcification
in the superior aspect of the medial collateral ligament, a finding
also present previously. No erosive change or appreciable joint
space narrowing.
IMPRESSION: Pellegrini-Stieda disease with calcification along the superior
aspect of the medial collateral ligament. Mild spurring along the
posterior patella. No appreciable joint space narrowing. No fracture
or joint effusion.

## 2015-10-18 MED ORDER — NAPROXEN 500 MG PO TBEC: 500.0000 mg | DELAYED_RELEASE_TABLET | Freq: Two times a day (BID) | ORAL | Status: DC

## 2015-10-18 NOTE — Discharge Instructions (Signed)
Your knee exam and x-ray were essentially normal. There is no sign of a fracture, dislocation, or sprain to the left knee. You may have some discomfort from the spurs under the kneecap. Your knee does not have any signs of arthritis or degeneration. Take the prescription anti-inflammatory along with your daily pain medicine and muscle relaxant. Follow-up with your provider or specialist as discussed.   Knee Pain Knee pain is a very common symptom and can have many causes. Knee pain often goes away when you follow your health care provider's instructions for relieving pain and discomfort at home. However, knee pain can develop into a condition that needs treatment. Some conditions may include:  Arthritis caused by wear and tear (osteoarthritis).  Arthritis caused by swelling and irritation (rheumatoid arthritis or gout).  A cyst or growth in your knee.  An infection in your knee joint.  An injury that will not heal.  Damage, swelling, or irritation of the tissues that support your knee (torn ligaments or tendinitis). If your knee pain continues, additional tests may be ordered to diagnose your condition. Tests may include X-rays or other imaging studies of your knee. You may also need to have fluid removed from your knee. Treatment for ongoing knee pain depends on the cause, but treatment may include:  Medicines to relieve pain or swelling.  Steroid injections in your knee.  Physical therapy.  Surgery. HOME CARE INSTRUCTIONS  Take medicines only as directed by your health care provider.  Rest your knee and keep it raised (elevated) while you are resting.  Do not do things that cause or worsen pain.  Avoid high-impact activities or exercises, such as running, jumping rope, or doing jumping jacks.  Apply ice to the knee area:  Put ice in a plastic bag.  Place a towel between your skin and the bag.  Leave the ice on for 20 minutes, 2-3 times a day.  Ask your health care  provider if you should wear an elastic knee support.  Keep a pillow under your knee when you sleep.  Lose weight if you are overweight. Extra weight can put pressure on your knee.  Do not use any tobacco products, including cigarettes, chewing tobacco, or electronic cigarettes. If you need help quitting, ask your health care provider. Smoking may slow the healing of any bone and joint problems that you may have. SEEK MEDICAL CARE IF:  Your knee pain continues, changes, or gets worse.  You have a fever along with knee pain.  Your knee buckles or locks up.  Your knee becomes more swollen. SEEK IMMEDIATE MEDICAL CARE IF:   Your knee joint feels hot to the touch.  You have chest pain or trouble breathing.   This information is not intended to replace advice given to you by your health care provider. Make sure you discuss any questions you have with your health care provider.   Document Released: 01/23/2007 Document Revised: 04/18/2014 Document Reviewed: 11/11/2013 Elsevier Interactive Patient Education [employer]2016 [employer] Inc.

## 2015-10-18 NOTE — ED Notes (Addendum)
Pt states she has been having pain from everywhere below her spine that started a month ago. Pt states she has lumbar disease. Pt also c/o right shoulder pain as well.

## 2015-10-18 NOTE — ED Provider Notes (Signed)
[facility] Emergency Department Provider Note ____________________________________________  Time seen: 1120  I have reviewed the triage vital signs and the nursing notes.  HISTORY  Chief Complaint  Hip Pain and Shoulder Pain  HPI Teresa [name] is a 55 y.o. female presents to the ED for evaluation of a 2 month complaint of left greater than right knee pain. She reports onset early June, but denies any particular injury, accident, or trauma. She reports she is currently being evaluated for lumbar degenerative disc disease. She has an MRI scheduled for sometime in the next few weeks. She is not currently on any anti-inflammatory regimen. She does have a prescription for oxycodone which she takes regularly for previous sternotomy for cardiac surgery.He reports her pain at a 9/10 in triage. She primarily complains of pain to the lateral aspect of the knee joint as well as some discomfort under the patella. She said she may have some arthritis of the knee. She reports pain is most increased with transitioning from sit to stand.  Past Medical History  Diagnosis Date  . Diabetes (HCC)   . COPD (chronic obstructive pulmonary disease) (HCC)   . Sleep apnea   . Hypertension   . Pancreatitis   . Microscopic hematuria   . Chest pain   . Urinary frequency   . Impetigo   . Chronic kidney disease   . Diabetic retinopathy (HCC)   . Urinary incontinence   . DDD (degenerative disc disease), lumbar   . Chronic back pain   . Ventricular septal defect   . Bilateral leg edema   . Genital herpes   . Obesity, morbid (HCC)   . CAD (coronary artery disease)   . HLD (hyperlipidemia)   . Anxiety disorder   . Peripheral neuropathy (HCC)   . Depression   . Heart burn   . Arthritis   . Heart disease   . Heart murmur   . Hepatitis   . Thyroid disease   . Asthma     Patient Active Problem List   Diagnosis Date Noted  . Morbid obesity (HCC) 10/08/2015  . Cystocele  10/08/2015  . SUI (stress urinary incontinence, female) 10/08/2015  . Menopause 10/08/2015  . Urinary frequency 08/06/2015  . Microscopic hematuria 08/06/2015  . Cystocele, grade 2 08/06/2015  . Morbid (severe) obesity due to excess calories (HCC) 05/15/2015  . TIA (transient ischemic attack) 01/01/2015  . Anterior chest wall pain 09/08/2014  . Nicotine addiction 08/19/2014  . Benign essential HTN 08/04/2014  . Combined fat and carbohydrate induced hyperlipemia 02/12/2014  . 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 Disease of lung 09/06/2013  . Heart valve disease 09/06/2013  . Chronic obstructive pulmonary disease (HCC) 02/19/2013  . Dependence on supplemental oxygen 02/19/2013  . Pulmonary hypertension (HCC) 02/19/2013  . Postprocedural state 02/19/2013  . Other specified postprocedural states 02/19/2013  . 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 Chronic pain 07/23/2012  . Diabetes mellitus (HCC) 07/23/2012  . Clinical depression 09/02/2009  . 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 Essential (primary) hypertension 06/01/2001    Past Surgical History  Procedure Laterality Date  . Cardiac surgery    . Wrist surgery Bilateral   . Cyst excision      Current Outpatient Rx  Name  Route  Sig  Dispense  Refill  . acyclovir (ZOVIRAX) 400 MG tablet   Oral   Take 400 mg by mouth 2 (two) times daily.         Marland Kitchen  albuterol (PROVENTIL HFA;VENTOLIN HFA) 108 (90 BASE) MCG/ACT inhaler   Inhalation   Inhale 1-2 puffs into the lungs every 6 (six) hours as needed for wheezing or shortness of breath.         . ALPRAZolam (XANAX) 1 MG tablet   Oral   Take 1 mg by mouth 3 (three) times daily as needed for anxiety. 3 to 4 times daily PRN         . amLODipine (NORVASC) 5 MG tablet   Oral   Take 5 mg by mouth daily.         Marland Kitchen aspirin 81 MG tablet   Oral   Take 1 tablet (81 mg total) by mouth daily.   30 tablet   0   . atorvastatin  (LIPITOR) 20 MG tablet   Oral   Take 20 mg by mouth at bedtime.         . baclofen (LIORESAL) 10 MG tablet   Oral   Take 10 mg by mouth.         . enalapril (VASOTEC) 5 MG tablet   Oral   Take 5 mg by mouth daily.         . famotidine (PEPCID) 20 MG tablet   Oral   Take 20 mg by mouth daily. Reported on 09/10/2015         . FLUoxetine HCl 60 MG TABS   Oral   Take 60 mg by mouth daily.         . fluticasone (FLONASE) 50 MCG/ACT nasal spray            0   . furosemide (LASIX) 40 MG tablet   Oral   Take 40 mg by mouth daily.         Marland Kitchen gabapentin (NEURONTIN) 300 MG capsule   Oral   Take by mouth 3 (three) times daily.      0   . insulin glargine (LANTUS) 100 UNIT/ML injection   Subcutaneous   Inject into the skin.         Marland Kitchen insulin lispro (HUMALOG) 100 UNIT/ML injection   Subcutaneous   Inject 2 Units into the skin 3 (three) times daily with meals. Reported on 09/10/2015         . metoprolol tartrate (LOPRESSOR) 25 MG tablet   Oral   Take 25 mg by mouth 2 (two) times daily. Reported on 08/06/2015         . naproxen (EC NAPROSYN) 500 MG EC tablet   Oral   Take 1 tablet (500 mg total) by mouth 2 (two) times daily with a meal.   30 tablet   0   . orphenadrine (NORFLEX) 100 MG tablet   Oral   Take 100 mg by mouth 2 (two) times daily as needed for muscle spasms. Reported on 08/06/2015         . oxybutynin (DITROPAN-XL) 5 MG 24 hr tablet      take 1 tablet by mouth once daily for BLADDER URGENCY      0   . oxyCODONE-acetaminophen (PERCOCET/ROXICET) 5-325 MG per tablet   Oral   Take 1 tablet by mouth every 6 (six) hours as needed for severe pain.         . OXYGEN   Inhalation   Inhale into the lungs.         . traZODone (DESYREL) 100 MG tablet   Oral   Take by mouth.         Marland Kitchen  umeclidinium-vilanterol (ANORO ELLIPTA) 62.5-25 MCG/INH AEPB   Inhalation   Inhale into the lungs.          Allergies Sulfa antibiotics; Amoxicillin;  Penicillins; and Shellfish allergy  Family History  Problem Relation Age of Onset  . Diabetes      2 Brother 2 Sister  . Hypertension    . Kidney disease Neg Hx   . Bladder Cancer Neg Hx     Social History Social History  Substance Use Topics  . Smoking status: Light Tobacco Smoker    Types: Cigarettes  . Smokeless tobacco: Not on file  . Alcohol Use: No   Review of Systems  Constitutional: Negative for fever. Musculoskeletal: Negative for back pain. Left > Right knee pain Skin: Negative for rash. Neurological: Negative for headaches, focal weakness or numbness. ____________________________________________  PHYSICAL EXAM:  VITAL SIGNS: ED Triage Vitals  Enc Vitals Group     BP 10/18/15 1051 145/57 mmHg     Pulse Rate 10/18/15 1051 90     Resp 10/18/15 1051 18     Temp 10/18/15 1051 98.6 F (37 C)     Temp Source 10/18/15 1051 Oral     SpO2 10/18/15 1051 93 %     Weight 10/18/15 1051 240 lb (108.863 kg)     Height 10/18/15 1051 5\' 2"  (1.575 m)     Head Cir --      Peak Flow --      Pain Score 10/18/15 1052 9     Pain Loc --      Pain Edu? --      Excl. in GC? --    Constitutional: Alert and oriented. Well appearing and in no distress. Head: Normocephalic and atraumatic. Cardiovascular: Normal distal pulses.   Respiratory: Normal respiratory effort.  Musculoskeletal: Left knee without obvious deformity, effusion, or edema. Patient with normal flexion and extension range to the left knee. She is tender primarily to the lateral aspect of the left knee. No appreciable valgus or varus joint stress is noted. No popliteal space fullness or tenderness is appreciated. No calf or Achilles tenderness is noted. Nontender with normal range of motion in all extremities.  Neurologic:  Normal gait without ataxia. Normal speech and language. No gross focal neurologic deficits are appreciated. Skin:  Skin is warm, dry and intact. No rash  noted. ____________________________________________   RADIOLOGY  Left Knee IMPRESSION:  Pellegrini-Stieda disease with calcification along the superior aspect of the medial collateral ligament. Mild spurring along the posterior patella. No appreciable joint space narrowing. No fracture or joint effusion.  Reviewed CT ABD 08/21/2015  Lower lumbar spondylosis and degenerative disc disease with grade 1 degenerative anterolisthesis at L4-5 and suspected foraminal impingement bilaterally at L5-S1 due to spurring and degenerative disc disease. ____________________________________________  INITIAL IMPRESSION / ASSESSMENT AND PLAN / ED COURSE  Patient with a 2 month complaint of intermittent left greater than right knee pain without intermittent injury, trauma, or accident. She has otherwise unremarkable knee exam and no significant DJD. She does have a calcified MCL, but no evidence of internal derangement. Patient is discharged with a prescription for Naprosyn to dose as directed. She will dose her daily oxycodone and Baclofen as previously prescribed. [facility] will [facility] for a pending lumbar MRI.  ____________________________________________  FINAL CLINICAL IMPRESSION(S) / ED DIAGNOSES  Final diagnoses:  Left knee pain  Pellegrini-Stieda syndrome of left knee      Lissa HoardJenise V Bacon Karlynn Furrow, PA-C 10/18/15 1507  Smith RobertJenise V  Lonia Skinner, PA-C 10/18/15 1508  Jennye Moccasin, MD 10/18/15 [phone]

## 2015-10-18 NOTE — ED Notes (Signed)
Patient states that her current issue is a chronic problem. "everytime the weather changes, my joints hurt". Patient is ambulatory but states that it is painful

## 2015-10-18 NOTE — ED Notes (Signed)
Pt asked for an ortho dr near here. Gave her the name of dr. Deeann SaintHoward miller.

## 2015-10-21 ENCOUNTER — Telehealth: Payer: Self-pay | Admitting: Urology

## 2015-10-21 NOTE — Telephone Encounter (Signed)
Alex from [employer] called about incontinent supplies for pt.  Please give him a call at [phone]

## 2015-10-26 NOTE — Telephone Encounter (Signed)
Spoke with Trinna PostAlex in reference to pt incontinence supplies.

## 2015-11-04 ENCOUNTER — Other Ambulatory Visit: Payer: Self-pay | Admitting: Specialist

## 2015-11-04 ENCOUNTER — Ambulatory Visit
Admission: RE | Admit: 2015-11-04 | Discharge: 2015-11-04 | Disposition: A | Discharge status: Home / Self Care | Payer: Medicare Other | Source: Ambulatory Visit | Attending: Specialist | Admitting: Specialist

## 2015-11-04 DIAGNOSIS — R609 Edema, unspecified: Secondary | ICD-10-CM

## 2015-11-04 DIAGNOSIS — R0602 Shortness of breath: Secondary | ICD-10-CM

## 2015-11-04 DIAGNOSIS — M79605 Pain in left leg: Secondary | ICD-10-CM

## 2015-11-04 DIAGNOSIS — M7989 Other specified soft tissue disorders: Secondary | ICD-10-CM | POA: Insufficient documentation

## 2015-11-05 ENCOUNTER — Ambulatory Visit (INDEPENDENT_AMBULATORY_CARE_PROVIDER_SITE_OTHER): Payer: Medicare Other | Admitting: Obstetrics and Gynecology

## 2015-11-05 ENCOUNTER — Encounter: Payer: Self-pay | Admitting: Obstetrics and Gynecology

## 2015-11-05 VITALS — BP 105/61 | HR 84 | Ht 62.0 in | Wt 245.3 lb

## 2015-11-05 DIAGNOSIS — N393 Stress incontinence (female) (male): Secondary | ICD-10-CM

## 2015-11-05 DIAGNOSIS — IMO0002 Reserved for concepts with insufficient information to code with codable children: Secondary | ICD-10-CM

## 2015-11-05 DIAGNOSIS — N8110 Cystocele, unspecified: Secondary | ICD-10-CM

## 2015-11-05 NOTE — Progress Notes (Signed)
Chief complaint: 1. Pessary fitting  Patient has history second-degree cystocele, mild uterine prolapse, and symptoms of stress urinary incontinence.  OBJECTIVE: BP 105/61   Pulse 84   Ht 5\' 2"  (1.575 m)   Wt 245 lb 4.8 oz (111.3 kg)   BMI 44.87 kg/m  PELVIC:                       External Genitalia: Normal                       BUS: Normal                       Vagina: First to second-degree cystocele; no rectocele                       Cervix: Normal; no cervical motion tenderness                       Uterus: Normal size, shape,consistency, mobile; positioned in mid vagina                       Adnexa: Normal; nonpalpable and nontender                       RV: Normal external exam                       Bladder: Nontender.  PROCEDURE: Pessary fitting  #3 incontinence dish with support-too large  #2 incontinence dish with support-SUCCESS   ASSESSMENT: 1. Cystocele, second degree 2. Mild uterine prolapse 3. Stress urinary incontinence 4. Pessary fitting- successful  PLAN: 1. Patient to be notified when pessary arrives 2. Order #2 incontinence dish with support  Herold Harms, MD  Note: This dictation was prepared with Dragon dictation along with smaller phrase technology. Any transcriptional errors that result from this process are unintentional.

## 2015-11-05 NOTE — Patient Instructions (Signed)
Patient will be notified pessary becomes available for insertion

## 2015-11-17 ENCOUNTER — Encounter: Payer: Self-pay | Admitting: Sports Medicine

## 2015-11-17 ENCOUNTER — Ambulatory Visit (INDEPENDENT_AMBULATORY_CARE_PROVIDER_SITE_OTHER): Payer: Medicare Other | Admitting: Sports Medicine

## 2015-11-17 DIAGNOSIS — E0800 Diabetes mellitus due to underlying condition with hyperosmolarity without nonketotic hyperglycemic-hyperosmolar coma (NKHHC): Secondary | ICD-10-CM

## 2015-11-17 DIAGNOSIS — B351 Tinea unguium: Secondary | ICD-10-CM | POA: Diagnosis not present

## 2015-11-17 DIAGNOSIS — M79676 Pain in unspecified toe(s): Secondary | ICD-10-CM | POA: Diagnosis not present

## 2015-11-17 NOTE — Progress Notes (Signed)
Patient ID: Teresa HarvestValerie A [name], female   DOB: Oct 14, 1960, 55 y.o.   MRN: [id number] Subjective: Teresa [name] is a 55 y.o. female patient with history of type 2 diabetes who presents to office today complaining of long, painful nails  while ambulating in shoes; unable to trim. Patient states that the glucose reading this morning was 200mg /dl on insulin sliding scale.Patient denies any new changes in medication or new problems. Patient denies any new cramping, numbness, burning or tingling in the legs.  Patient Active Problem List   Diagnosis Date Noted  . Morbid obesity (HCC) 10/08/2015  . Cystocele 10/08/2015  . SUI (stress urinary incontinence, female) 10/08/2015  . Menopause 10/08/2015  . Urinary frequency 08/06/2015  . Microscopic hematuria 08/06/2015  . Cystocele, grade 2 08/06/2015  . Morbid (severe) obesity due to excess calories (HCC) 05/15/2015  . TIA (transient ischemic attack) 01/01/2015  . Anterior chest wall pain 09/08/2014  . Nicotine addiction 08/19/2014  . Benign essential HTN 08/04/2014  . Combined fat and carbohydrate induced hyperlipemia 02/12/2014  . 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 Disease of lung 09/06/2013  . Heart valve disease 09/06/2013  . Chronic obstructive pulmonary disease (HCC) 02/19/2013  . Dependence on supplemental oxygen 02/19/2013  . Pulmonary hypertension (HCC) 02/19/2013  . Postprocedural state 02/19/2013  . Other specified postprocedural states 02/19/2013  . 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 Chronic pain 07/23/2012  . Diabetes mellitus (HCC) 07/23/2012  . Clinical depression 09/02/2009  . 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 Essential (primary) hypertension 06/01/2001   Current Outpatient Prescriptions on File Prior to Visit  Medication Sig Dispense Refill  . acyclovir (ZOVIRAX) 400 MG tablet Take 400 mg by mouth 2 (two) times daily.    Marland Kitchen. albuterol (PROVENTIL HFA;VENTOLIN HFA) 108 (90  BASE) MCG/ACT inhaler Inhale 1-2 puffs into the lungs every 6 (six) hours as needed for wheezing or shortness of breath.    . ALPRAZolam (XANAX) 1 MG tablet Take 1 mg by mouth 3 (three) times daily as needed for anxiety. 3 to 4 times daily PRN    . amLODipine (NORVASC) 5 MG tablet Take 5 mg by mouth daily.    Marland Kitchen. aspirin 81 MG tablet Take 1 tablet (81 mg total) by mouth daily. 30 tablet 0  . atorvastatin (LIPITOR) 20 MG tablet Take 20 mg by mouth at bedtime.    . baclofen (LIORESAL) 10 MG tablet Take 10 mg by mouth.    . enalapril (VASOTEC) 5 MG tablet Take 5 mg by mouth daily.    Marland Kitchen. FLUoxetine HCl 60 MG TABS Take 60 mg by mouth daily.    . fluticasone (FLONASE) 50 MCG/ACT nasal spray   0  . furosemide (LASIX) 40 MG tablet Take 40 mg by mouth daily.    Marland Kitchen. gabapentin (NEURONTIN) 300 MG capsule Take by mouth 3 (three) times daily.  0  . insulin glargine (LANTUS) 100 UNIT/ML injection Inject into the skin.    Marland Kitchen. insulin lispro (HUMALOG) 100 UNIT/ML injection Inject 2 Units into the skin 3 (three) times daily with meals. Reported on 09/10/2015    . metoprolol tartrate (LOPRESSOR) 25 MG tablet Take 25 mg by mouth 2 (two) times daily. Reported on 08/06/2015    . naproxen (EC NAPROSYN) 500 MG EC tablet Take 1 tablet (500 mg total) by mouth 2 (two) times daily with a meal. 30 tablet 0  . orphenadrine (NORFLEX) 100 MG tablet Take 100 mg by mouth 2 (two) times daily  as needed for muscle spasms. Reported on 08/06/2015    . oxybutynin (DITROPAN-XL) 5 MG 24 hr tablet take 1 tablet by mouth once daily for BLADDER URGENCY  0  . oxyCODONE-acetaminophen (PERCOCET/ROXICET) 5-325 MG per tablet Take 1 tablet by mouth every 6 (six) hours as needed for severe pain.    . OXYGEN Inhale into the lungs.    . traZODone (DESYREL) 100 MG tablet Take by mouth.    . umeclidinium-vilanterol (ANORO ELLIPTA) 62.5-25 MCG/INH AEPB Inhale into the lungs.     Current Facility-Administered Medications on File Prior to Visit  Medication Dose  Route Frequency Provider Last Rate Last Dose  . triamcinolone acetonide (KENALOG) 10 MG/ML injection 10 mg  10 mg Other Once Vivi Barrack, DPM       Allergies  Allergen Reactions  . Sulfa Antibiotics Shortness Of Breath  . Amoxicillin Other (See Comments)    Body shakes  . Penicillins Other (See Comments)    Body shakes  . Shellfish Allergy Swelling    Objective: General: Patient is awake, alert, and oriented x 3 and in no acute distress on Oxygen   Integument: Skin is warm, dry and supple bilateral with mild callus skin at bilateral heels with no opening or signs of infection. Nails are tender, long, thickened and dystrophic with subungual debris, consistent with onychomycosis, 1-5 bilateral. No signs of infection. No open lesions or preulcerative lesions present bilateral. Remaining integument unremarkable.  Vasculature:  Dorsalis Pedis pulse 2/4 bilateral. Posterior Tibial pulse  1/4 bilateral.  Capillary fill time <3 sec 1-5 bilateral. Scant hair growth to the level of the digits. Temperature gradient within normal limits. No varicosities present bilateral. No edema present bilateral.   Neurology: The patient has intact sensation measured with a 5.07/10g Semmes Weinstein Monofilament at all pedal sites bilateral . Vibratory sensation diminished bilateral with tuning fork. No Babinski sign present bilateral.   Musculoskeletal: No gross pedal deformities noted bilateral. Muscular strength 5/5 in all lower extremity muscular groups bilateral without pain or limitation on range of motion . No tenderness with calf compression bilateral.  Assessment and Plan: Problem List Items Addressed This Visit    None    Visit Diagnoses    Dermatophytosis of nail    -  Primary   Pain of toe, unspecified laterality       Diabetes mellitus due to underlying condition with hyperosmolarity without coma, unspecified long term insulin use status (HCC)         -Examined patient. -Discussed and  educated patient on diabetic foot care, especially with  regards to the vascular, neurological and musculoskeletal systems.  -Stressed the importance of good glycemic control and the detriment of not  controlling glucose levels in relation to the foot. -Mechanically debrided all nails 1-5 bilateral using sterile nail nipper and filed with dremel without incident  -Recommend continue daily skin emollients dry skin -Answered all patient questions -Patient to return  in 3 months for at risk foot care -Patient advised to call the office if any problems or questions arise in the meantime.  Asencion Islam, DPM

## 2015-12-15 ENCOUNTER — Encounter: Payer: Self-pay | Admitting: Obstetrics and Gynecology

## 2015-12-15 ENCOUNTER — Ambulatory Visit (INDEPENDENT_AMBULATORY_CARE_PROVIDER_SITE_OTHER): Payer: Medicare Other | Admitting: Obstetrics and Gynecology

## 2015-12-15 VITALS — BP 124/65 | HR 80 | Ht 62.0 in | Wt 246.8 lb

## 2015-12-15 DIAGNOSIS — N8110 Cystocele, unspecified: Secondary | ICD-10-CM | POA: Diagnosis not present

## 2015-12-15 DIAGNOSIS — R319 Hematuria, unspecified: Secondary | ICD-10-CM

## 2015-12-15 DIAGNOSIS — N393 Stress incontinence (female) (male): Secondary | ICD-10-CM | POA: Diagnosis not present

## 2015-12-15 DIAGNOSIS — IMO0002 Reserved for concepts with insufficient information to code with codable children: Secondary | ICD-10-CM

## 2015-12-15 DIAGNOSIS — R300 Dysuria: Secondary | ICD-10-CM

## 2015-12-15 DIAGNOSIS — R1030 Lower abdominal pain, unspecified: Secondary | ICD-10-CM | POA: Diagnosis not present

## 2015-12-15 LAB — POCT URINALYSIS DIPSTICK
Bilirubin, UA: NEGATIVE
Glucose, UA: NEGATIVE
Ketones, UA: NEGATIVE
LEUKOCYTES UA: NEGATIVE
Nitrite, UA: NEGATIVE
PROTEIN UA: NEGATIVE
UROBILINOGEN UA: NEGATIVE
pH, UA: 6

## 2015-12-15 NOTE — Patient Instructions (Signed)
1. Incontinence dish pessary is inserted today 2. Return in 2 weeks for follow-up 3. Urinalysis and culture is obtained today to rule out bladder infection due to suprapubic discomfort

## 2015-12-15 NOTE — Progress Notes (Signed)
Chief complaint: 1. Pessary insertion 2. Suprapubic pain  Patient presents today for pessary insertion. An incontinence dish was ordered and is now available for insertion. Concern today is for possible UTI  Past medical history, past surgical history, problem list, medications, and allergies are reviewed  OBJECTIVE: BP 124/65   Pulse 80   Ht 5\' 2"  (1.575 m)   Wt 246 lb 12.8 oz (111.9 kg)   BMI 45.14 kg/m  Pleasant female in no acute distress Abdomen: Soft, nontender except in the suprapubic region with mild tenderness being noted Pelvic exam: External genitalia-normal BUS normal Vagina-. Estrogen effect Bimanual exam-no palpable masses; mild suprapubic tenderness Rectovaginal-normal external exam  The incontinence dish pessary is inserted  ASSESSMENT: 1. Second degree cystocele 2. Stress urinary incontinence 3. Suprapubic pain 4. Morbid obesity  PLAN: 1. Pessary inserted 2. Instructions for Trimosan gel use are given.  3. Urinalysis with urine culture is obtained 4. Return in 2 weeks for follow-up  A total of 15 minutes were spent face-to-face with the patient during this encounter and over half of that time dealt with counseling and coordination of care.  Herold HarmsMartin A Defrancesco, MD  Note: This dictation was prepared with Dragon dictation along with smaller phrase technology. Any transcriptional errors that result from this process are unintentional.

## 2015-12-17 LAB — URINE CULTURE

## 2016-01-07 ENCOUNTER — Ambulatory Visit (INDEPENDENT_AMBULATORY_CARE_PROVIDER_SITE_OTHER): Payer: Medicare Other | Admitting: Obstetrics and Gynecology

## 2016-01-07 VITALS — BP 117/64 | HR 88 | Ht 62.0 in | Wt 243.6 lb

## 2016-01-07 DIAGNOSIS — R102 Pelvic and perineal pain: Secondary | ICD-10-CM

## 2016-01-07 DIAGNOSIS — N393 Stress incontinence (female) (male): Secondary | ICD-10-CM | POA: Diagnosis not present

## 2016-01-07 DIAGNOSIS — Z4689 Encounter for fitting and adjustment of other specified devices: Secondary | ICD-10-CM

## 2016-01-07 DIAGNOSIS — IMO0002 Reserved for concepts with insufficient information to code with codable children: Secondary | ICD-10-CM

## 2016-01-07 DIAGNOSIS — N8110 Cystocele, unspecified: Secondary | ICD-10-CM | POA: Diagnosis not present

## 2016-01-07 LAB — POCT URINALYSIS DIPSTICK
BILIRUBIN UA: NEGATIVE
GLUCOSE UA: NEGATIVE
KETONES UA: NEGATIVE
LEUKOCYTES UA: NEGATIVE
Nitrite, UA: NEGATIVE
Protein, UA: NEGATIVE
SPEC GRAV UA: 1.01
Urobilinogen, UA: NEGATIVE
pH, UA: 6

## 2016-01-07 NOTE — Patient Instructions (Addendum)
1. Return in 6 weeks for pessary maintenance 

## 2016-01-07 NOTE — Progress Notes (Signed)
Chief complaint: 1. Cystocele 2. Arrest urinary incontinence 3. Pessary maintenance-incontinence dish  Patient presents for 2 week al placement of incontinence dish pessary. Patient states that the episodes of incontinence are less but still occurring, more significantly at night where she needs to wear pads. She has no  Vaginal odor or vaginal discharge.  OBJECTIVE:  BP 117/64   Pulse 88   Ht 5\' 2"  (1.575 m)   Wt 243 lb 9.6 oz (110.5 kg)   BMI 44.56 kg/m  Pleasant female in no acute distress Abdomen: Soft, nontender except in the suprapubic region with mild tenderness being noted Pelvic exam: External genitalia-normal BUS normal Vagina-. Estrogen effect Bimanual exam-no palpable masses; mild suprapubic tenderness Rectovaginal-normal external exam  PROCEDURE: Pessary is removed, cleaned, and reinserted  ASSESSMENT: 1. Second degree cystocele 2. Stress urinary incontinence 3. Suprapubic pain 4. Morbid obesity  PLAN: 1.Pessary is removed, cleaned, and reinserted 2. Continue using Trimosan gel 3. Return in 6 weeks for pessary maintenance  A total of 15 minutes were spent face-to-face with the patient during this encounter and over half of that time dealt with counseling and coordination of care.  Herold HarmsMartin A Defrancesco, MD  Note: This dictation was prepared with Dragon dictation along with smaller phrase technology. Any transcriptional errors that result from this process are unintentional.

## 2016-01-09 LAB — URINE CULTURE: ORGANISM ID, BACTERIA: NO GROWTH

## 2016-02-16 ENCOUNTER — Encounter: Admission: RE | Payer: Self-pay | Source: Ambulatory Visit

## 2016-02-16 ENCOUNTER — Ambulatory Visit: Admission: RE | Admit: 2016-02-16 | Payer: Medicare Other | Source: Ambulatory Visit | Admitting: Gastroenterology

## 2016-02-16 SURGERY — COLONOSCOPY WITH PROPOFOL
Anesthesia: General

## 2016-02-19 ENCOUNTER — Ambulatory Visit: Payer: Medicare Other | Admitting: Podiatry

## 2016-03-22 ENCOUNTER — Ambulatory Visit (INDEPENDENT_AMBULATORY_CARE_PROVIDER_SITE_OTHER): Payer: Medicare Other | Admitting: Podiatry

## 2016-03-22 DIAGNOSIS — M79609 Pain in unspecified limb: Secondary | ICD-10-CM

## 2016-03-22 DIAGNOSIS — L603 Nail dystrophy: Secondary | ICD-10-CM

## 2016-03-22 DIAGNOSIS — L301 Dyshidrosis [pompholyx]: Secondary | ICD-10-CM | POA: Diagnosis not present

## 2016-03-22 DIAGNOSIS — B351 Tinea unguium: Secondary | ICD-10-CM

## 2016-03-22 DIAGNOSIS — B353 Tinea pedis: Secondary | ICD-10-CM | POA: Diagnosis not present

## 2016-03-22 DIAGNOSIS — L299 Pruritus, unspecified: Secondary | ICD-10-CM

## 2016-03-22 DIAGNOSIS — L608 Other nail disorders: Secondary | ICD-10-CM

## 2016-03-22 DIAGNOSIS — E0843 Diabetes mellitus due to underlying condition with diabetic autonomic (poly)neuropathy: Secondary | ICD-10-CM

## 2016-03-22 NOTE — Progress Notes (Signed)
SUBJECTIVE Patient with a history of diabetes mellitus presents to office today complaining of elongated, thickened nails. Pain while ambulating in shoes. Patient is unable to trim their own nails.  Patient also complains of a new complaint of itching to the bilateral feet. Patient states that she also experiences itching between the toes. Patient states that the itching is been going on for several weeks now. Patient has tried alcohol to her bilateral feet without alleviation of symptoms. Patient presents today for further treatment and evaluation.  Allergies  Allergen Reactions  . Sulfa Antibiotics Shortness Of Breath  . Amoxicillin Other (See Comments)    Body shakes  . Penicillins Other (See Comments)    Body shakes  . Shellfish Allergy Swelling    OBJECTIVE General Patient is awake, alert, and oriented x 3 and in no acute distress. Derm Pruritic, xerotic skin noted to the bilateral weightbearing surfaces of the interdigital areas. Mild dermatitis noted likely secondary to tinea pedis. Skin is dry and supple bilateral. Negative open lesions or macerations. Remaining integument unremarkable. Nails are tender, long, thickened and dystrophic with subungual debris, consistent with onychomycosis, 1-5 bilateral. No signs of infection noted. Vasc  DP and PT pedal pulses palpable bilaterally. Temperature gradient within normal limits.  Neuro Epicritic and protective threshold sensation diminished bilaterally.  Musculoskeletal Exam No symptomatic pedal deformities noted bilateral. Muscular strength within normal limits.  ASSESSMENT 1. Diabetes Mellitus w/ peripheral neuropathy 2. Onychomycosis of nail due to dermatophyte bilateral 3. Pain in foot bilateral 4. Xerosis bilateral 5. Tinea pedis bilateral feet  PLAN OF CARE 1. Patient evaluated today. 2. Instructed to maintain good pedal hygiene and foot care. Stressed importance of controlling blood sugar.  3. Mechanical debridement of nails  1-5 bilaterally performed using a nail nipper. Filed with dremel without incident.  4. Prescription for Lotrisone cream  5. Today we discussed the findings of tinea pedis/athlete's foot. Discussed conservative modalities to alleviate and prevent symptoms. 6. Return to clinic in 3 mos.    > 15 minutes the patient    Felecia ShellingBrent M. Evans, DPM [facility]  Dr. Felecia ShellingBrent M. Evans, DPM   [address]                                        [place], [place] [address]                Office [phone]  Fax [phone]

## 2016-04-09 ENCOUNTER — Emergency Department
Admission: EM | Admit: 2016-04-09 | Discharge: 2016-04-10 | Disposition: A | Discharge status: Home / Self Care | Payer: Medicare Other | Attending: Emergency Medicine | Admitting: Emergency Medicine

## 2016-04-09 DIAGNOSIS — F17210 Nicotine dependence, cigarettes, uncomplicated: Secondary | ICD-10-CM | POA: Insufficient documentation

## 2016-04-09 DIAGNOSIS — R350 Frequency of micturition: Secondary | ICD-10-CM

## 2016-04-09 DIAGNOSIS — N189 Chronic kidney disease, unspecified: Secondary | ICD-10-CM | POA: Insufficient documentation

## 2016-04-09 DIAGNOSIS — J449 Chronic obstructive pulmonary disease, unspecified: Secondary | ICD-10-CM | POA: Insufficient documentation

## 2016-04-09 DIAGNOSIS — Z79899 Other long term (current) drug therapy: Secondary | ICD-10-CM | POA: Diagnosis not present

## 2016-04-09 DIAGNOSIS — J45909 Unspecified asthma, uncomplicated: Secondary | ICD-10-CM | POA: Diagnosis not present

## 2016-04-09 DIAGNOSIS — R300 Dysuria: Secondary | ICD-10-CM | POA: Diagnosis not present

## 2016-04-09 DIAGNOSIS — E1122 Type 2 diabetes mellitus with diabetic chronic kidney disease: Secondary | ICD-10-CM | POA: Insufficient documentation

## 2016-04-09 DIAGNOSIS — Z7982 Long term (current) use of aspirin: Secondary | ICD-10-CM | POA: Diagnosis not present

## 2016-04-09 DIAGNOSIS — Z794 Long term (current) use of insulin: Secondary | ICD-10-CM | POA: Diagnosis not present

## 2016-04-09 DIAGNOSIS — I129 Hypertensive chronic kidney disease with stage 1 through stage 4 chronic kidney disease, or unspecified chronic kidney disease: Secondary | ICD-10-CM | POA: Diagnosis not present

## 2016-04-09 LAB — URINALYSIS, COMPLETE (UACMP) WITH MICROSCOPIC
Bacteria, UA: NONE SEEN
Bilirubin Urine: NEGATIVE
GLUCOSE, UA: 50 mg/dL — AB
HGB URINE DIPSTICK: NEGATIVE
KETONES UR: NEGATIVE mg/dL
LEUKOCYTES UA: NEGATIVE
NITRITE: NEGATIVE
PROTEIN: NEGATIVE mg/dL
Specific Gravity, Urine: 1.023 (ref 1.005–1.030)
pH: 5 (ref 5.0–8.0)

## 2016-04-09 MED ORDER — PHENAZOPYRIDINE HCL 200 MG PO TABS
200.0000 mg | ORAL_TABLET | Freq: Once | ORAL | Status: AC
  Administered 2016-04-10: 200 mg via ORAL
  Filled 2016-04-09: qty 1

## 2016-04-09 MED ORDER — PHENAZOPYRIDINE HCL 200 MG PO TABS: 200.0000 mg | ORAL_TABLET | Freq: Three times a day (TID) | ORAL | 0 refills | Status: DC | PRN

## 2016-04-09 NOTE — ED Provider Notes (Signed)
[facility] Emergency Department Provider Note   ____________________________________________   First MD Initiated Contact with Patient 04/09/16 2314     (approximate)  I have reviewed the triage vital signs and the nursing notes.   HISTORY  Chief Complaint Urinary Frequency    HPI Teresa [name] is a 55 y.o. female who presents to the ED from home with a chief complain of urinary frequency and dysuria. Patient reports she had a pessary placed approximately 6 weeks ago; has not cleaned it since. Complains of a sensation that she has a UTI as well as lower back pain. Denies associated fever, chills, chest pain, shortness of breath, abdominal pain, nausea, vomiting, hematuria, diarrhea. Denies recent travel trauma. Nothing makes her symptoms better or worse.   Past Medical History:  Diagnosis Date  . Anxiety disorder   . Arthritis   . Asthma   . Bilateral leg edema   . CAD (coronary artery disease)   . Chest pain   . Chronic back pain   . Chronic kidney disease   . COPD (chronic obstructive pulmonary disease) (HCC)   . DDD (degenerative disc disease), lumbar   . Depression   . Diabetes (HCC)   . Diabetic retinopathy (HCC)   . Genital herpes   . Heart burn   . Heart disease   . Heart murmur   . Hepatitis   . HLD (hyperlipidemia)   . Hypertension   . Impetigo   . Microscopic hematuria   . Obesity, morbid (HCC)   . Pancreatitis   . Peripheral neuropathy (HCC)   . Sleep apnea   . Thyroid disease   . Urinary frequency   . Urinary incontinence   . Ventricular septal defect     Patient Active Problem List   Diagnosis Date Noted  . Morbid obesity (HCC) 10/08/2015  . Cystocele 10/08/2015  . SUI (stress urinary incontinence, female) 10/08/2015  . Menopause 10/08/2015  . Urinary frequency 08/06/2015  . Microscopic hematuria 08/06/2015  . Cystocele, grade 2 08/06/2015  . Morbid (severe) obesity due to excess calories (HCC) 05/15/2015  .  TIA (transient ischemic attack) 01/01/2015  . Anterior chest wall pain 09/08/2014  . Nicotine addiction 08/19/2014  . Benign essential HTN 08/04/2014  . Combined fat and carbohydrate induced hyperlipemia 02/12/2014  . 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 Disease of lung 09/06/2013  . Heart valve disease 09/06/2013  . Chronic obstructive pulmonary disease (HCC) 02/19/2013  . Dependence on supplemental oxygen 02/19/2013  . Pulmonary hypertension 02/19/2013  . Postprocedural state 02/19/2013  . Other specified postprocedural states 02/19/2013  . 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 Chronic pain 07/23/2012  . Diabetes mellitus (HCC) 07/23/2012  . Clinical depression 09/02/2009  . Major depressive disorder with single episode 09/02/2009  . Essential (primary) hypertension 06/01/2001    Past Surgical History:  Procedure Laterality Date  . CARDIAC SURGERY    . CYST EXCISION    . WRIST SURGERY Bilateral     Prior to Admission medications   Medication Sig Start Date End Date Taking? Authorizing Provider  acyclovir (ZOVIRAX) 400 MG tablet Take 400 mg by mouth 2 (two) times daily.    Historical Provider, MD  albuterol (PROVENTIL HFA;VENTOLIN HFA) 108 (90 BASE) MCG/ACT inhaler Inhale 1-2 puffs into the lungs every 6 (six) hours as needed for wheezing or shortness of breath.    Historical Provider, MD  ALPRAZolam Prudy Feeler(XANAX) 1 MG tablet Take 1 mg by mouth 3 (three) times daily as needed  for anxiety. 3 to 4 times daily PRN    Historical Provider, MD  amLODipine (NORVASC) 5 MG tablet Take 5 mg by mouth daily.    Historical Provider, MD  aspirin 81 MG tablet Take 1 tablet (81 mg total) by mouth daily. 01/02/15   Altamese Dilling, MD  atorvastatin (LIPITOR) 20 MG tablet Take 20 mg by mouth at bedtime.    Historical Provider, MD  baclofen (LIORESAL) 10 MG tablet Take 10 mg by mouth. 08/25/15 08/24/16  Historical Provider, MD  budesonide-formoterol (SYMBICORT) 160-4.5 MCG/ACT inhaler  Inhale into the lungs. 11/10/15   Historical Provider, MD  dicyclomine (BENTYL) 10 MG capsule Take by mouth. 11/06/15 11/05/16  Historical Provider, MD  enalapril (VASOTEC) 5 MG tablet Take 5 mg by mouth daily.    Historical Provider, MD  famotidine (PEPCID) 20 MG tablet Take by mouth. 12/11/15   Historical Provider, MD  FLUoxetine HCl 60 MG TABS Take 60 mg by mouth daily.    Historical Provider, MD  fluticasone Aleda Grana) 50 MCG/ACT nasal spray  06/04/15   Historical Provider, MD  furosemide (LASIX) 40 MG tablet Take 40 mg by mouth daily.    Historical Provider, MD  gabapentin (NEURONTIN) 300 MG capsule Take by mouth 3 (three) times daily. 08/25/15   Historical Provider, MD  insulin glargine (LANTUS) 100 UNIT/ML injection Inject into the skin. 08/25/15   Historical Provider, MD  insulin lispro (HUMALOG) 100 UNIT/ML injection Inject 2 Units into the skin 3 (three) times daily with meals. Reported on 09/10/2015    Historical Provider, MD  metoprolol tartrate (LOPRESSOR) 25 MG tablet Take 25 mg by mouth 2 (two) times daily. Reported on 08/06/2015    Historical Provider, MD  naproxen (EC NAPROSYN) 500 MG EC tablet Take 1 tablet (500 mg total) by mouth 2 (two) times daily with a meal. 10/18/15   Jenise V Bacon Menshew, PA-C  orphenadrine (NORFLEX) 100 MG tablet Take 100 mg by mouth 2 (two) times daily as needed for muscle spasms. Reported on 08/06/2015    Historical Provider, MD  oxybutynin (DITROPAN-XL) 5 MG 24 hr tablet take 1 tablet by mouth once daily for BLADDER URGENCY 07/15/15   Historical Provider, MD  oxyCODONE-acetaminophen (PERCOCET/ROXICET) 5-325 MG per tablet Take 1 tablet by mouth every 6 (six) hours as needed for severe pain.    Historical Provider, MD  OXYGEN Inhale into the lungs.    Historical Provider, MD  pantoprazole (PROTONIX) 40 MG tablet Take by mouth. 11/06/15   Historical Provider, MD  phenazopyridine (PYRIDIUM) 200 MG tablet Take 1 tablet (200 mg total) by mouth 3 (three) times daily as needed  for pain. 04/09/16   Irean Hong, MD  polyethylene glycol powder [facility]) powder Take by mouth. 11/06/15   Historical Provider, MD  traZODone (DESYREL) 100 MG tablet Take by mouth.    Historical Provider, MD  umeclidinium-vilanterol (ANORO ELLIPTA) 62.5-25 MCG/INH AEPB Inhale into the lungs. 05/01/15   Historical Provider, MD    Allergies Sulfa antibiotics; Amoxicillin; Penicillins; and Shellfish allergy  Family History  Problem Relation Age of Onset  . Diabetes      2 Brother 2 Sister  . Hypertension    . Kidney disease Neg Hx   . Bladder Cancer Neg Hx     Social History Social History  Substance Use Topics  . Smoking status: Light Tobacco Smoker    Types: Cigarettes  . Smokeless tobacco: Not on file  . Alcohol use No    Review of Systems  Constitutional: No fever/chills. Eyes: No visual changes. ENT: No sore throat. Cardiovascular: Denies chest pain. Respiratory: Denies shortness of breath. Gastrointestinal: No abdominal pain.  No nausea, no vomiting.  No diarrhea.  No constipation. Genitourinary: Positive for urinary frequency and dysuria. Musculoskeletal: Negative for back pain. Skin: Negative for rash. Neurological: Negative for headaches, focal weakness or numbness.  10-point ROS otherwise negative.  ____________________________________________   PHYSICAL EXAM:  VITAL SIGNS: ED Triage Vitals  Enc Vitals Group     BP 04/09/16 1849 122/61     Pulse Rate 04/09/16 1849 85     Resp 04/09/16 1849 20     Temp 04/09/16 1849 98.5 F (36.9 C)     Temp Source 04/09/16 1849 Oral     SpO2 04/09/16 1849 95 %     Weight 04/09/16 1855 240 lb (108.9 kg)     Height 04/09/16 1855 5\' 2"  (1.575 m)     Head Circumference --      Peak Flow --      Pain Score 04/09/16 1855 9     Pain Loc --      Pain Edu? --      Excl. in GC? --     Constitutional: Asleep, awakened for exam. Alert and oriented. Well appearing and in no acute distress. Eyes: Conjunctivae are  normal. PERRL. EOMI. Head: Atraumatic. Nose: No congestion/rhinnorhea. Mouth/Throat: Mucous membranes are moist.  Oropharynx non-erythematous. Neck: No stridor.   Cardiovascular: Normal rate, regular rhythm. Grossly normal heart sounds.  Good peripheral circulation. Respiratory: Normal respiratory effort.  No retractions. Lungs CTAB. Gastrointestinal: Obese. Soft and nontender to light and deep palpation. No distention. No abdominal bruits. No CVA tenderness. Musculoskeletal: No lower extremity tenderness nor edema.  No joint effusions. Neurologic:  Normal speech and language. No gross focal neurologic deficits are appreciated. No gait instability. Skin:  Skin is warm, dry and intact. No rash noted. Psychiatric: Mood and affect are normal. Speech and behavior are normal.  ____________________________________________   LABS (all labs ordered are listed, but only abnormal results are displayed)  Labs Reviewed  URINALYSIS, COMPLETE (UACMP) WITH MICROSCOPIC - Abnormal; Notable for the following:       Result Value   Color, Urine AMBER (*)    APPearance CLEAR (*)    Glucose, UA 50 (*)    Squamous Epithelial / LPF 0-5 (*)    All other components within normal limits   ____________________________________________  EKG  None ____________________________________________  RADIOLOGY  None ____________________________________________   PROCEDURES  Procedure(s) performed: None  Procedures  Critical Care performed: No  ____________________________________________   INITIAL IMPRESSION / ASSESSMENT AND PLAN / ED COURSE  Pertinent labs & imaging results that were available during my care of the patient were reviewed by me and considered in my medical decision making (see chart for details).  55 year old female who presents with urinary frequency and dysuria in the setting of new pessary ring placed approximately 6 weeks ago. Will add urine culture, initiate treatment with  Pyridium and patient will follow-up with her PCP next week. Encouraged patient to cleanse her pessary ring as instructed by her doctor. Strict return precautions given. Patient verbalizes understanding and agrees with plan of care.  Clinical Course      ____________________________________________   FINAL CLINICAL IMPRESSION(S) / ED DIAGNOSES  Final diagnoses:  Urinary frequency  Dysuria      NEW MEDICATIONS STARTED DURING THIS VISIT:  New Prescriptions   PHENAZOPYRIDINE (PYRIDIUM) 200 MG TABLET    Take 1  tablet (200 mg total) by mouth 3 (three) times daily as needed for pain.     Note:  This document was prepared using Dragon voice recognition software and may include unintentional dictation errors.    Irean Hong, MD 04/10/16 [phone]

## 2016-04-09 NOTE — ED Triage Notes (Signed)
Pt states that she had a pesary placed a couple months ago to help treat her urinary incontinence, pt states that she has been having sensation that she has a uti as well as lower back pain, pt also noted that she didn't feel the "cup" inside her anymore

## 2016-04-09 NOTE — ED Notes (Signed)
Pt reports she has a pessary ring that was placed by her ob-gyn. Pt reports since about 3 weeks ago she has been having frequency with urination and pain in her bladder region. Pt states she has not cleansed the pessary ring since it was placed 6 weeks ago.

## 2016-04-09 NOTE — Discharge Instructions (Signed)
1. Please clean your pessary (bladder cap) as instructed by your doctor. 2. You may take Pyridium as needed for urinary discomfort. 3. Return to the ER for worsening symptoms, persistent vomiting, fever or other concerns.

## 2016-04-10 DIAGNOSIS — R350 Frequency of micturition: Secondary | ICD-10-CM | POA: Diagnosis not present

## 2016-04-11 LAB — URINE CULTURE
Culture: <10000 — AB
SPECIAL REQUESTS: NORMAL

## 2016-04-18 DIAGNOSIS — M5416 Radiculopathy, lumbar region: Secondary | ICD-10-CM | POA: Insufficient documentation

## 2016-04-18 DIAGNOSIS — M48061 Spinal stenosis, lumbar region without neurogenic claudication: Secondary | ICD-10-CM | POA: Insufficient documentation

## 2016-05-05 ENCOUNTER — Encounter: Payer: Self-pay | Admitting: Obstetrics and Gynecology

## 2016-05-05 ENCOUNTER — Ambulatory Visit (INDEPENDENT_AMBULATORY_CARE_PROVIDER_SITE_OTHER): Payer: Medicare Other | Admitting: Obstetrics and Gynecology

## 2016-05-05 VITALS — BP 107/63 | HR 96 | Ht 62.0 in | Wt 245.1 lb

## 2016-05-05 DIAGNOSIS — R319 Hematuria, unspecified: Secondary | ICD-10-CM | POA: Diagnosis not present

## 2016-05-05 DIAGNOSIS — Z4689 Encounter for fitting and adjustment of other specified devices: Secondary | ICD-10-CM | POA: Diagnosis not present

## 2016-05-05 LAB — POCT URINALYSIS DIPSTICK
Bilirubin, UA: NEGATIVE
Glucose, UA: NEGATIVE
KETONES UA: NEGATIVE
LEUKOCYTES UA: NEGATIVE
Nitrite, UA: NEGATIVE
PH UA: 5
Protein, UA: NEGATIVE
Spec Grav, UA: 1.02
Urobilinogen, UA: NEGATIVE

## 2016-05-05 MED ORDER — NITROFURANTOIN MONOHYD MACRO 100 MG PO CAPS: 100.0000 mg | ORAL_CAPSULE | Freq: Two times a day (BID) | ORAL | 1 refills | Status: DC

## 2016-05-05 NOTE — Progress Notes (Signed)
56 yo here with c/o hematuria since this Am. Dysuria. Pt has pessary in place. Denies fever or chills.

## 2016-05-05 NOTE — Progress Notes (Signed)
HPI:      Ms. Teresa [name] is a 56 y.o. G0P0000 who LMP was No LMP recorded (exact date). Patient is postmenopausal..  Subjective: She presents today with complaint of bloody urine. She also has pressure at the end of voiding. She has a pessary in place and has not been examined and cleaned since September. She has recently undergone back surgery.    Hx: The following portions of the patient's history were reviewed and updated as appropriate:           She  has a past medical history of Anxiety disorder; Arthritis; Asthma; Bilateral leg edema; CAD (coronary artery disease); Chest pain; Chronic back pain; Chronic kidney disease; COPD (chronic obstructive pulmonary disease) (HCC); DDD (degenerative disc disease), lumbar; Depression; Diabetes (HCC); Diabetic retinopathy (HCC); Genital herpes; Heart burn; Heart disease; Heart murmur; Hepatitis; HLD (hyperlipidemia); Hypertension; Impetigo; Microscopic hematuria; Obesity, morbid (HCC); Pancreatitis; Peripheral neuropathy (HCC); Sleep apnea; Thyroid disease; Urinary frequency; Urinary incontinence; and Ventricular septal defect. She  does not have any pertinent problems on file. She  has a past surgical history that includes Cardiac surgery; Wrist surgery (Bilateral); Cyst excision; and Lumbar disc surgery.        ROS: Constitutional: Denied constitutional symptoms, night sweats, recent illness, fatigue, fever, insomnia and weight loss.  Eyes: Denied eye symptoms, eye pain, photophobia, vision change and visual disturbance.  Ears/Nose/Throat/Neck: Denied ear, nose, throat or neck symptoms, hearing loss, nasal discharge, sinus congestion and sore throat.  Cardiovascular: Denied cardiovascular symptoms, arrhythmia, chest pain/pressure, edema, exercise intolerance, orthopnea and palpitations.  Respiratory: Denied pulmonary symptoms, asthma, pleuritic pain, productive sputum, cough, dyspnea and wheezing.  Gastrointestinal: Denied, gastro-esophageal  reflux, melena, nausea and vomiting.  Genitourinary: See HPI for additional information.  Musculoskeletal: Denied musculoskeletal symptoms, stiffness, swelling, muscle weakness and myalgia.  Dermatologic: Denied dermatology symptoms, rash and scar.  Neurologic: Denied neurology symptoms, dizziness, headache, neck pain and syncope.  Psychiatric: Denied psychiatric symptoms, anxiety and depression.  Endocrine: Denied endocrine symptoms including hot flashes and night sweats.   Meds: She has a current medication list which includes the following prescription(s): acyclovir, albuterol, alprazolam, amlodipine, aspirin, atorvastatin, baclofen, budesonide-formoterol, docusate sodium, enalapril, famotidine, fluoxetine hcl, fluticasone, furosemide, gabapentin, insulin glargine, insulin lispro, insulin pen needle, metoprolol tartrate, naproxen, onetouch delica lancets fine, orphenadrine, oxycodone-acetaminophen, oxygen-helium, pantoprazole, polyethylene glycol powder, trazodone, umeclidinium-vilanterol, nicoderm cq, nitrofurantoin (macrocrystal-monohydrate), ondansetron, and one touch ultra test, and the following Facility-Administered Medications: triamcinolone acetonide.  Objective: Vitals:   05/05/16 1341  BP: 107/63  Pulse: 96  Physical examination   Pelvic:   Vulva: Normal appearance.  No lesions.  Vagina: No lesions or abnormalities noted. No erosions   Support: Normal pelvic support.  Urethra No masses tenderness or scarring.  Meatus Normal size without lesions or prolapse.  Cervix: Normal appearance.  No lesions.  Anus: Normal exam.  No lesions.  Perineum: Normal exam.  No lesions.        Bimanual   Uterus: Normal size.  Non-tender.  Mobile.  AV.  Adnexae: No masses.  Non-tender to palpation.  Cul-de-sac: Negative for abnormality.   Pessary Care Pessary removed and cleaned.  Vagina checked - without erosions - pessary replaced.           UA consistent with hematuria.  Assessment: 1.  Hematuria, unspecified type   2. Encounter for pessary maintenance        Plan:            1.  Will treat for presumed UTI.  Urine sent for C and S. 2.  Pessary removed and cleaned. Follow-up in 8 weeks for reexamination and cleaning.   Orders Meds ordered this encounter  Medications  . Insulin Pen Needle (B-D UF III MINI PEN NEEDLES) 31G X 5 MM MISC  . docusate sodium (COLACE) 100 MG capsule    Sig: Take 100 mg by mouth.  . ONE TOUCH ULTRA TEST test strip  . NICODERM CQ 7 MG/24HR patch    Refill:  0  . ondansetron (ZOFRAN) 4 MG tablet  . ONETOUCH DELICA LANCETS FINE MISC  . nitrofurantoin, macrocrystal-monohydrate, (MACROBID) 100 MG capsule    Sig: Take 1 capsule (100 mg total) by mouth 2 (two) times daily.    Dispense:  14 capsule    Refill:  1           F/U  Return in about 8 weeks (around 06/30/2016) for Pt to contact us if symptoms worsen, Annual Physical.  Elonda Huskyavid J. Annell Canty, M.D. 05/05/2016 2:15 PM

## 2016-05-09 LAB — URINE CULTURE

## 2016-06-24 ENCOUNTER — Encounter: Payer: Self-pay | Admitting: Obstetrics and Gynecology

## 2016-06-24 ENCOUNTER — Ambulatory Visit (INDEPENDENT_AMBULATORY_CARE_PROVIDER_SITE_OTHER): Payer: Medicare Other | Admitting: Obstetrics and Gynecology

## 2016-06-24 VITALS — BP 119/64 | HR 102 | Ht 62.0 in | Wt 246.8 lb

## 2016-06-24 DIAGNOSIS — R300 Dysuria: Secondary | ICD-10-CM | POA: Diagnosis not present

## 2016-06-24 DIAGNOSIS — Z4689 Encounter for fitting and adjustment of other specified devices: Secondary | ICD-10-CM | POA: Diagnosis not present

## 2016-06-24 DIAGNOSIS — R102 Pelvic and perineal pain: Secondary | ICD-10-CM | POA: Diagnosis not present

## 2016-06-24 DIAGNOSIS — M545 Low back pain: Secondary | ICD-10-CM | POA: Diagnosis not present

## 2016-06-24 LAB — POCT URINALYSIS DIPSTICK
Bilirubin, UA: NEGATIVE
Glucose, UA: NEGATIVE
Ketones, UA: NEGATIVE
Leukocytes, UA: NEGATIVE
Nitrite, UA: NEGATIVE
Protein, UA: NEGATIVE
Spec Grav, UA: 1.01 (ref 1.030–1.035)
UROBILINOGEN UA: NEGATIVE (ref ?–2.0)
pH, UA: 6 (ref 5.0–8.0)

## 2016-06-24 MED ORDER — TERCONAZOLE 0.4 % VA CREA: 1.0000 | TOPICAL_CREAM | Freq: Every day | VAGINAL | 0 refills | Status: AC

## 2016-06-26 LAB — URINE CULTURE: Organism ID, Bacteria: NO GROWTH

## 2016-06-30 NOTE — Patient Instructions (Signed)
1. Pessary is removed and will be left out for 2 weeks 2. Urinalysis and urine culture to rule out UTI 3. Return in 2 weeks for reassessment and probable pessary insertion

## 2016-06-30 NOTE — Progress Notes (Signed)
Chief complaint: 1. UTI symptoms 2. Low back pain 3. Vaginal discomfort  Patient presents for evaluation of symptoms. She had recent back surgery and is struggling with recovery. She also has developed urinary frequency and urgency as well as vaginal discomfort. She is using an incontinence dish pessary and was concerned about possible malposition.  Past Medical History:  Diagnosis Date  . Anxiety disorder   . Arthritis   . Asthma   . Bilateral leg edema   . CAD (coronary artery disease)   . Chest pain   . Chronic back pain   . Chronic kidney disease   . COPD (chronic obstructive pulmonary disease) (HCC)   . DDD (degenerative disc disease), lumbar   . Depression   . Diabetes (HCC)   . Diabetic retinopathy (HCC)   . Genital herpes   . Heart burn   . Heart disease   . Heart murmur   . Hepatitis   . HLD (hyperlipidemia)   . Hypertension   . Impetigo   . Microscopic hematuria   . Obesity, morbid (HCC)   . Pancreatitis   . Peripheral neuropathy (HCC)   . Sleep apnea   . Thyroid disease   . Urinary frequency   . Urinary incontinence   . Ventricular septal defect    Past Surgical History:  Procedure Laterality Date  . CARDIAC SURGERY    . CYST EXCISION    . LUMBAR DISC SURGERY    . WRIST SURGERY Bilateral     Review of Systems  Constitutional: Negative for chills (.vs), diaphoresis and fever.  Respiratory: Negative.   Cardiovascular: Negative.   Gastrointestinal: Negative for diarrhea, nausea and vomiting.  Genitourinary: Positive for dysuria, frequency and urgency. Negative for flank pain.  Musculoskeletal: Positive for back pain.  Skin: Negative.    OBJECTIVE: BP 119/64   Pulse (!) 102   Ht 5\' 2"  (1.575 m)   Wt 246 lb 12.8 oz (111.9 kg)   BMI 45.14 kg/m   Pleasant female in no distress. Back: No CVA tenderness Abdomen: Soft, nontender except in the suprapubic region with mild tenderness being noted Pelvic exam: External genitalia-normal BUS  normal Vagina-. Estrogen effect Bimanual exam-no palpable masses; mild suprapubic tenderness Rectovaginal-normal external exam  PROCEDURE: Pessary is removed and not REINSERTED  ASSESSMENT: 1. UTI symptoms 2. Normal pessary placement 3. Second-degree cystocele 4. Stress urinary incontinence  PLAN: 1. Pessary is removed and will be left out for 2 weeks 2. Urinalysis and urine culture to rule out UTI 3. Return in 2 weeks for reassessment and probable pessary insertion  A total of 15 minutes were spent face-to-face with the patient during this encounter and over half of that time dealt with counseling and coordination of care.  Herold HarmsMartin A Defrancesco, MD  Note: This dictation was prepared with Dragon dictation along with smaller phrase technology. Any transcriptional errors that result from this process are unintentional.

## 2016-07-14 ENCOUNTER — Ambulatory Visit (INDEPENDENT_AMBULATORY_CARE_PROVIDER_SITE_OTHER): Payer: Medicare Other | Admitting: Obstetrics and Gynecology

## 2016-07-14 ENCOUNTER — Encounter: Payer: Self-pay | Admitting: Obstetrics and Gynecology

## 2016-07-14 VITALS — BP 118/62 | HR 86 | Ht 62.0 in | Wt 246.9 lb

## 2016-07-14 DIAGNOSIS — B373 Candidiasis of vulva and vagina: Secondary | ICD-10-CM | POA: Diagnosis not present

## 2016-07-14 DIAGNOSIS — B3731 Acute candidiasis of vulva and vagina: Secondary | ICD-10-CM

## 2016-07-14 DIAGNOSIS — N393 Stress incontinence (female) (male): Secondary | ICD-10-CM

## 2016-07-14 DIAGNOSIS — Z4689 Encounter for fitting and adjustment of other specified devices: Secondary | ICD-10-CM

## 2016-07-14 MED ORDER — FLUCONAZOLE 150 MG PO TABS: 150.0000 mg | ORAL_TABLET | ORAL | 0 refills | Status: DC

## 2016-07-14 NOTE — Progress Notes (Signed)
Chief complaint: 1. Stress urinary incontinence 2. History of Monilia vaginitis, recent treated 3. Pessary maintenance  Patient presents for pessary maintenance. Her pessary was removed at last visit and she subsequently was treated for Monilia vaginitis. She is experiencing increased pressure symptoms and incontinence and is desiring reinsertion of her pessary. The patient also is experiencing some mild irritative vaginitis symptoms and is requesting oral therapy for yeast today.  Past medical history, past surgical history, problem list, medications, and allergies are reviewed  OBJECTIVE: BP 118/62   Pulse 86   Ht  (1.575 m)   Wt 246 lb 14.4 oz (112 kg)   BMI 45.16 kg/m   Pleasant female in moderate discomfort with getting into position on the table as well as off the table. Abdomen: Soft, nontender except in the suprapubic region with mild tenderness being noted Pelvic exam: External genitalia-normal BUS normal Vagina-. Estrogen effect; minimal yellowness creations involved Bimanual exam-no palpable masses; mild suprapubic tenderness Rectovaginal-normal external exam  PROCEDURE: Pessary is REINSERTED today.  ASSESSMENT: 1. History of Monilia vaginitis treated Terazol 2. Ongoing mild vaginitis symptoms without significant discharge 3. Second-degree cystocele 4. Stress urinary incontinence, with increased pressure noted today without pessary being in place  PLAN: 1. Diflucan 150 mg orally every week for 3 weeks 2. Pessary is reinserted today 3. Return in 4 weeks for follow-up   A total of 15 minutes were spent face-to-face with the patient during this encounter and over half of that time dealt with counseling and coordination of care.  Herold Harms, MD  Note: This dictation was prepared with Dragon dictation along with smaller phrase technology. Any transcriptional errors that result from this process are unintentional.

## 2016-07-14 NOTE — Patient Instructions (Signed)
1. Pessary is reinserted today 2. Diflucan 150 mg weekly for 3 weeks is prescribed 3. Return in 4 weeks for follow-up

## 2016-09-06 ENCOUNTER — Other Ambulatory Visit: Payer: Self-pay | Admitting: Internal Medicine

## 2016-09-06 DIAGNOSIS — Z1231 Encounter for screening mammogram for malignant neoplasm of breast: Secondary | ICD-10-CM

## 2016-09-09 ENCOUNTER — Ambulatory Visit: Payer: Medicare Other

## 2016-09-10 ENCOUNTER — Emergency Department: Payer: Medicare Other

## 2016-09-10 ENCOUNTER — Encounter: Payer: Self-pay | Admitting: Emergency Medicine

## 2016-09-10 ENCOUNTER — Emergency Department
Admission: EM | Admit: 2016-09-10 | Discharge: 2016-09-10 | Disposition: A | Discharge status: Home / Self Care | Payer: Medicare Other | Attending: Emergency Medicine | Admitting: Emergency Medicine

## 2016-09-10 DIAGNOSIS — E119 Type 2 diabetes mellitus without complications: Secondary | ICD-10-CM | POA: Diagnosis not present

## 2016-09-10 DIAGNOSIS — N189 Chronic kidney disease, unspecified: Secondary | ICD-10-CM | POA: Diagnosis not present

## 2016-09-10 DIAGNOSIS — F17210 Nicotine dependence, cigarettes, uncomplicated: Secondary | ICD-10-CM | POA: Insufficient documentation

## 2016-09-10 DIAGNOSIS — J449 Chronic obstructive pulmonary disease, unspecified: Secondary | ICD-10-CM | POA: Insufficient documentation

## 2016-09-10 DIAGNOSIS — I2510 Atherosclerotic heart disease of native coronary artery without angina pectoris: Secondary | ICD-10-CM | POA: Insufficient documentation

## 2016-09-10 DIAGNOSIS — Z79899 Other long term (current) drug therapy: Secondary | ICD-10-CM | POA: Diagnosis not present

## 2016-09-10 DIAGNOSIS — Z7984 Long term (current) use of oral hypoglycemic drugs: Secondary | ICD-10-CM | POA: Insufficient documentation

## 2016-09-10 DIAGNOSIS — J45909 Unspecified asthma, uncomplicated: Secondary | ICD-10-CM | POA: Diagnosis not present

## 2016-09-10 DIAGNOSIS — I129 Hypertensive chronic kidney disease with stage 1 through stage 4 chronic kidney disease, or unspecified chronic kidney disease: Secondary | ICD-10-CM | POA: Insufficient documentation

## 2016-09-10 DIAGNOSIS — R1084 Generalized abdominal pain: Secondary | ICD-10-CM

## 2016-09-10 DIAGNOSIS — R1030 Lower abdominal pain, unspecified: Secondary | ICD-10-CM | POA: Diagnosis present

## 2016-09-10 LAB — URINALYSIS, COMPLETE (UACMP) WITH MICROSCOPIC
Bacteria, UA: NONE SEEN
Bilirubin Urine: NEGATIVE
GLUCOSE, UA: 50 mg/dL — AB
Hgb urine dipstick: NEGATIVE
Ketones, ur: NEGATIVE mg/dL
Leukocytes, UA: NEGATIVE
Nitrite: NEGATIVE
PROTEIN: 30 mg/dL — AB
Specific Gravity, Urine: 1.025 (ref 1.005–1.030)
pH: 6 (ref 5.0–8.0)

## 2016-09-10 LAB — CBC WITH DIFFERENTIAL/PLATELET
Basophils Absolute: 0 10*3/uL (ref 0–0.1)
Basophils Relative: 0 %
EOS ABS: 0.2 10*3/uL (ref 0–0.7)
EOS PCT: 3 %
HCT: 33.9 % — ABNORMAL LOW (ref 35.0–47.0)
HEMOGLOBIN: 11.2 g/dL — AB (ref 12.0–16.0)
LYMPHS ABS: 2.1 10*3/uL (ref 1.0–3.6)
Lymphocytes Relative: 26 %
MCH: 29.6 pg (ref 26.0–34.0)
MCHC: 33.1 g/dL (ref 32.0–36.0)
MCV: 89.3 fL (ref 80.0–100.0)
Monocytes Absolute: 0.5 10*3/uL (ref 0.2–0.9)
Monocytes Relative: 6 %
NEUTROS PCT: 65 %
Neutro Abs: 5.2 10*3/uL (ref 1.4–6.5)
Platelets: 301 10*3/uL (ref 150–440)
RBC: 3.8 MIL/uL (ref 3.80–5.20)
RDW: 14.7 % — AB (ref 11.5–14.5)
WBC: 8 10*3/uL (ref 3.6–11.0)

## 2016-09-10 LAB — COMPREHENSIVE METABOLIC PANEL
ALBUMIN: 3.8 g/dL (ref 3.5–5.0)
ALT: 21 U/L (ref 14–54)
AST: 31 U/L (ref 15–41)
Alkaline Phosphatase: 78 U/L (ref 38–126)
Anion gap: 9 (ref 5–15)
BUN: 18 mg/dL (ref 6–20)
CHLORIDE: 100 mmol/L — AB (ref 101–111)
CO2: 28 mmol/L (ref 22–32)
CREATININE: 1.03 mg/dL — AB (ref 0.44–1.00)
Calcium: 9.1 mg/dL (ref 8.9–10.3)
GFR calc Af Amer: >60 mL/min (ref >60)
GFR, EST NON AFRICAN AMERICAN: 60 mL/min — AB (ref >60)
GLUCOSE: 255 mg/dL — AB (ref 65–99)
POTASSIUM: 3.7 mmol/L (ref 3.5–5.1)
Sodium: 137 mmol/L (ref 135–145)
Total Bilirubin: 0.4 mg/dL (ref 0.3–1.2)
Total Protein: 7.5 g/dL (ref 6.5–8.1)

## 2016-09-10 LAB — LIPASE, BLOOD: Lipase: 33 U/L (ref 11–51)

## 2016-09-10 IMAGING — CT CT ABD-PELV W/ CM
2 of 5 series · 17 of 46 positions shown, 19 images · IV contrast (APPLIED)
Comparison: [DATE] and prior CTs

CLINICAL DATA: 56-year-old female with abdominal and pelvic pain
for 3 weeks.

EXAM:
CT ABDOMEN AND PELVIS WITH CONTRAST
TECHNIQUE: Multidetector CT imaging of the abdomen and pelvis was performed
using the standard protocol following bolus administration of
intravenous contrast.
CONTRAST:  100 cc intravenous [L7]

[Series 2: routine abd/pel with · axial · 0.98mm/px · z∈[-992,-587]mm · 14 of 91 slices shown, 16 images]
[im 5/91  soft-tissue]
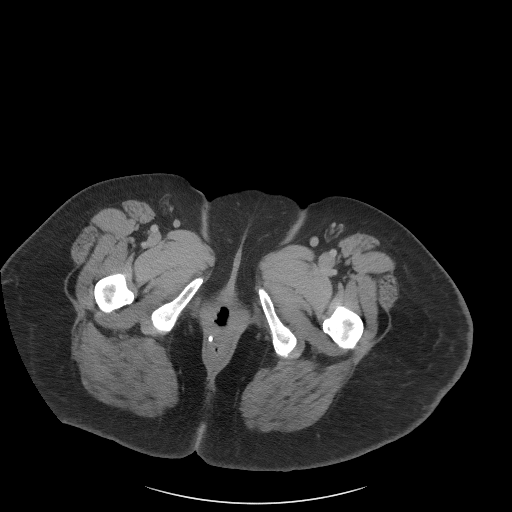
[im 5/91  bone]
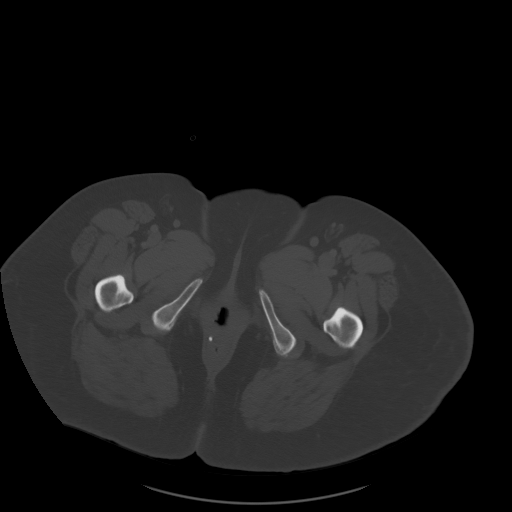
[im 10/91  soft-tissue]
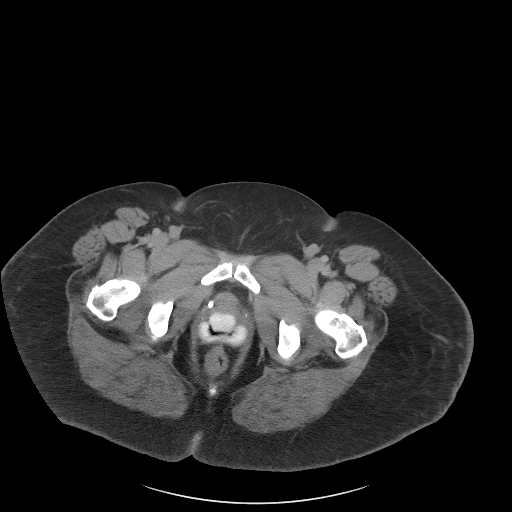
[im 19/91  soft-tissue]
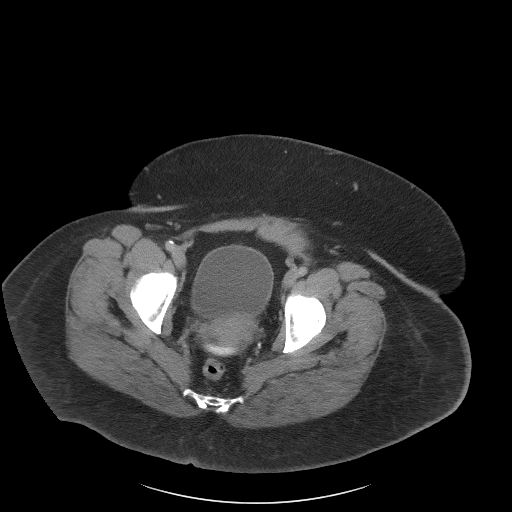
[im 24/91  soft-tissue]
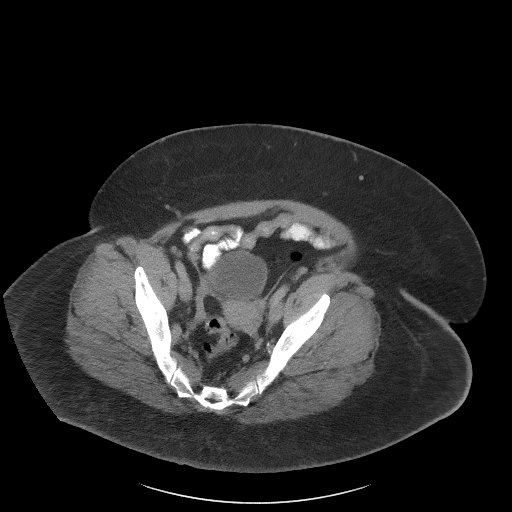
[im 29/91  soft-tissue]
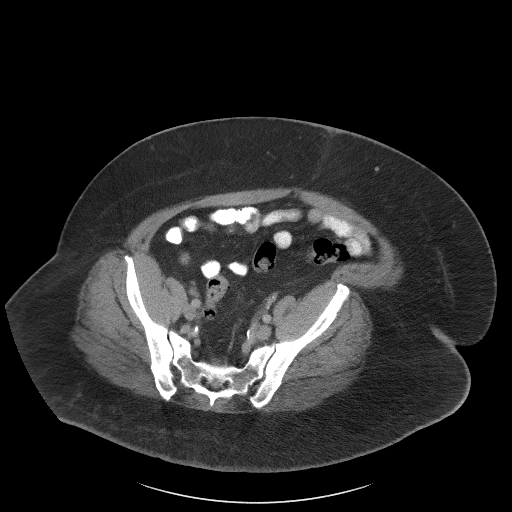
[im 38/91  soft-tissue]
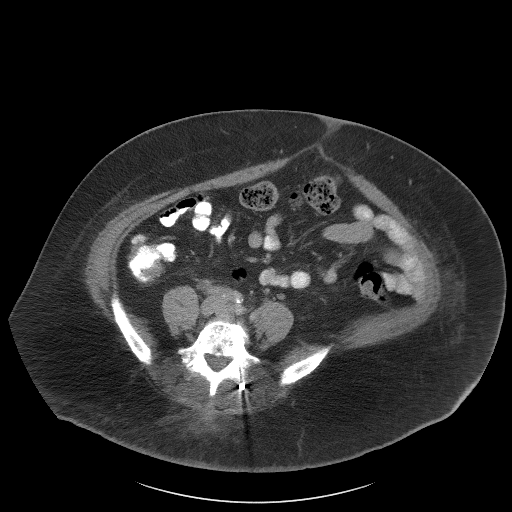
[im 43/91  soft-tissue]
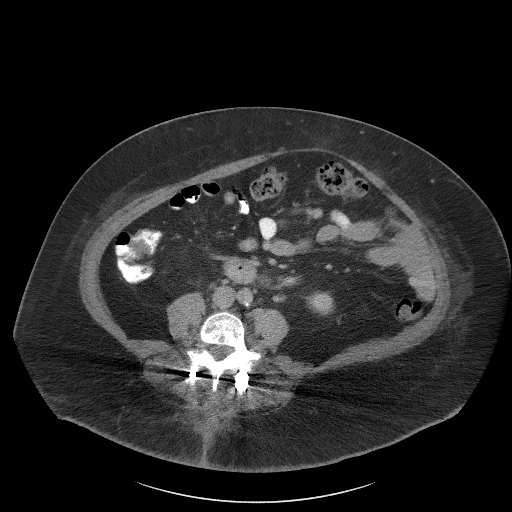
[im 48/91  soft-tissue]
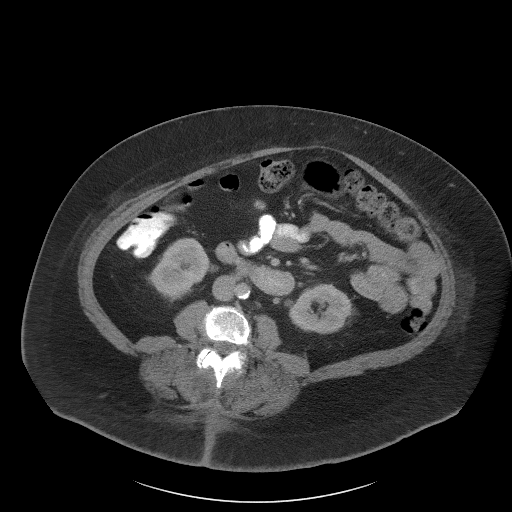
[im 53/91  soft-tissue]
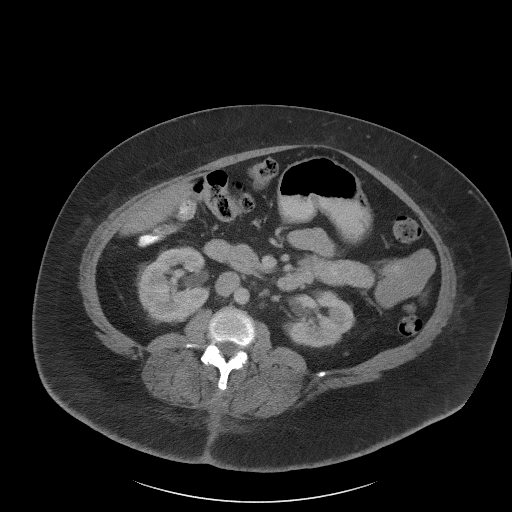
[im 53/91  bone]
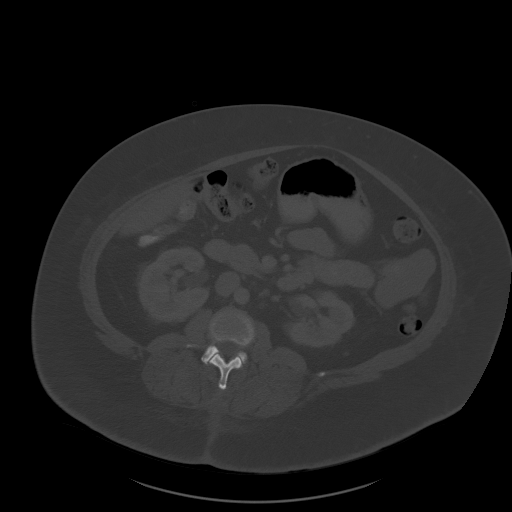
[im 62/91  soft-tissue]
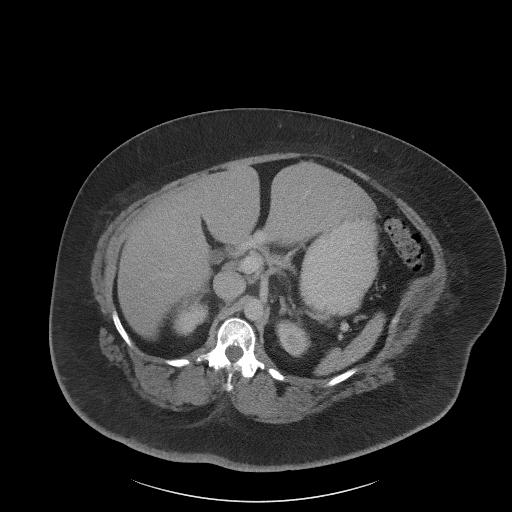
[im 67/91  soft-tissue]
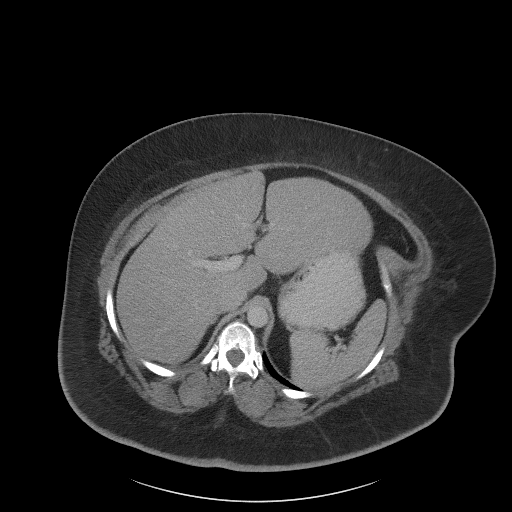
[im 72/91  soft-tissue]
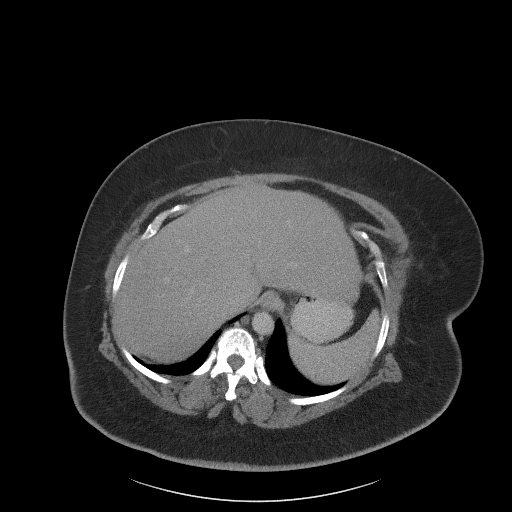
[im 81/91  soft-tissue]
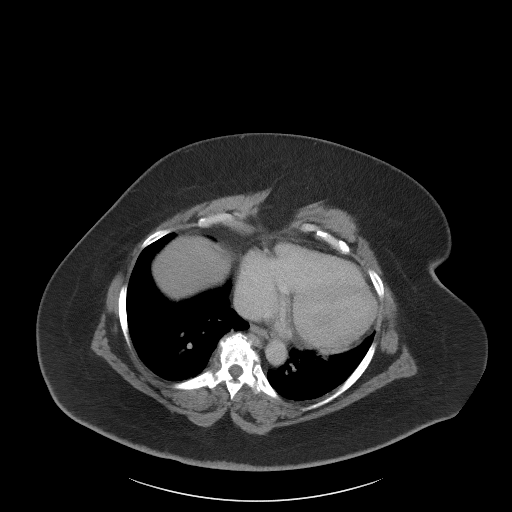
[im 86/91  soft-tissue]
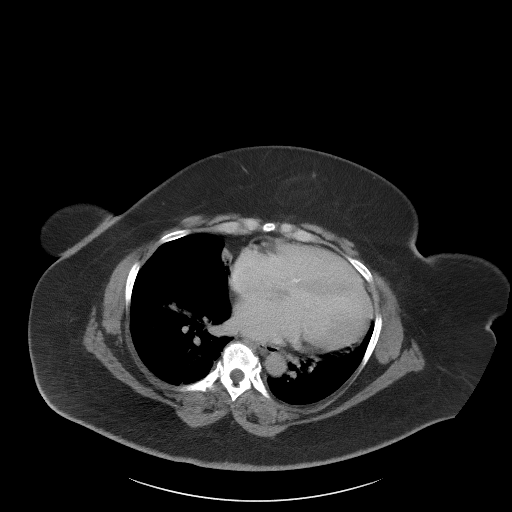

[Series 5: coronal st · coronal · 0.88mm/px · 3 of 115 slices shown]
[im 39/115  soft-tissue]
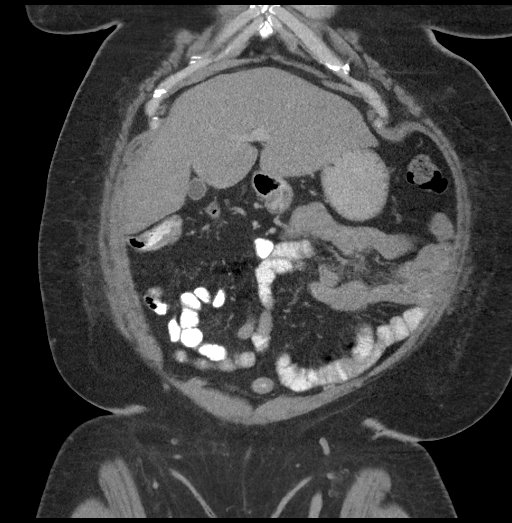
[im 51/115  soft-tissue]
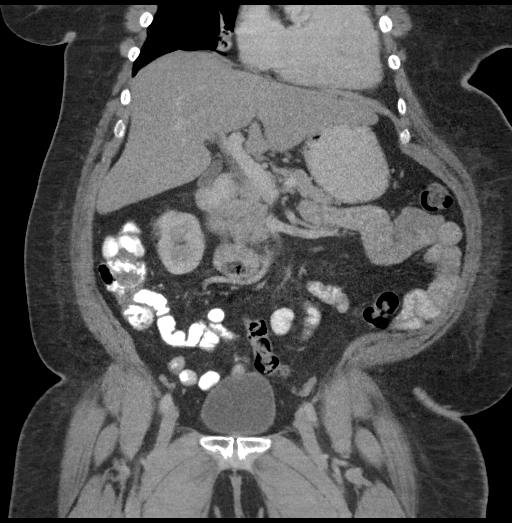
[im 64/115  soft-tissue]
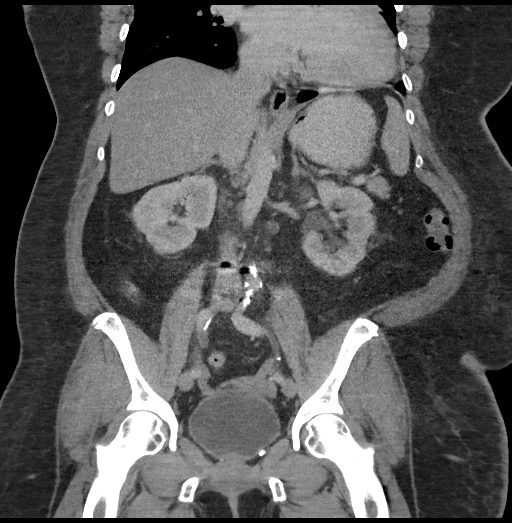

[17 of 46 positions shown; findings below may reference images not displayed]

FINDINGS: Lower chest: No acute abnormality.  Cardiomegaly again noted.

Hepatobiliary: Mild hepatic steatosis again noted. No focal hepatic
abnormalities are present. The gallbladder is unremarkable. There is
no evidence of biliary dilatation.

Pancreas: Unremarkable

Spleen: Unremarkable

Adrenals/Urinary Tract: The kidneys, adrenal glands and bladder are
unremarkable.

Stomach/Bowel: Stomach is within normal limits. Appendix appears
normal. No evidence of bowel wall thickening, distention, or
inflammatory changes.

Vascular/Lymphatic: Aortic atherosclerotic calcifications noted
without aneurysm. No enlarged lymph nodes identified.

Reproductive: Uterus and bilateral adnexa are unremarkable. A
pessary is present.

Other: No free fluid, focal collection or pneumoperitoneum. A stable
moderate to large midline upper abdominal ventral hernia noted
containing only fat.

Musculoskeletal: No acute or suspicious abnormality. Posterior
fusion changes at L4-5 noted.
IMPRESSION: No evidence of acute abnormality.

Cardiomegaly, mild hepatic steatosis and moderate to large midline
upper abdominal ventral hernia containing fat.

Abdominal aortic atherosclerosis.

## 2016-09-10 MED ORDER — ONDANSETRON 4 MG PO TBDP: 4.0000 mg | ORAL_TABLET | Freq: Three times a day (TID) | ORAL | 0 refills | Status: DC | PRN

## 2016-09-10 MED ORDER — ONDANSETRON HCL 4 MG/2ML IJ SOLN
4.0000 mg | Freq: Once | INTRAMUSCULAR | Status: AC
  Administered 2016-09-10: 4 mg via INTRAVENOUS
  Filled 2016-09-10: qty 2

## 2016-09-10 MED ORDER — IOPAMIDOL (ISOVUE-300) INJECTION 61%
30.0000 mL | Freq: Once | INTRAVENOUS | Status: AC | PRN
  Administered 2016-09-10: 30 mL via ORAL

## 2016-09-10 MED ORDER — IOPAMIDOL (ISOVUE-300) INJECTION 61%
100.0000 mL | Freq: Once | INTRAVENOUS | Status: AC | PRN
  Administered 2016-09-10: 100 mL via INTRAVENOUS

## 2016-09-10 MED ORDER — SODIUM CHLORIDE 0.9 % IV BOLUS (SEPSIS)
1000.0000 mL | Freq: Once | INTRAVENOUS | Status: AC
  Administered 2016-09-10: 1000 mL via INTRAVENOUS

## 2016-09-10 NOTE — ED Provider Notes (Signed)
[facility] Emergency Department Provider Note  ____________________________________________  Time seen: Approximately 9:38 AM  I have reviewed the triage vital signs and the nursing notes.   HISTORY  Chief Complaint Abdominal Pain    HPI Teresa [name] is a 56 y.o. female who complains of lower abdominal pain for the past 3 weeks. Gradual onset, waxing and waning but constant. Nonradiating. Associated with nausea but no vomiting. Also reports diarrhea. Also complains of dysuria frequency and foul-smelling urine. She does note that she has a bladder pessary. Denies fever. No aggravating or alleviating factors for the pain. Moderate intensity.  She also reports compliance with all her medications including insulin-dependent diabetes. She does feel like she's been more thirsty recently and her blood sugar has been running higher..     Past Medical History:  Diagnosis Date  . Anxiety disorder   . Arthritis   . Asthma   . Bilateral leg edema   . CAD (coronary artery disease)   . Chest pain   . Chronic back pain   . Chronic kidney disease   . COPD (chronic obstructive pulmonary disease) (HCC)   . DDD (degenerative disc disease), lumbar   . Depression   . Diabetes (HCC)   . Diabetic retinopathy (HCC)   . Genital herpes   . Heart burn   . Heart disease   . Heart murmur   . Hepatitis   . HLD (hyperlipidemia)   . Hypertension   . Impetigo   . Microscopic hematuria   . Obesity, morbid (HCC)   . Pancreatitis   . Peripheral neuropathy   . Sleep apnea   . Thyroid disease   . Urinary frequency   . Urinary incontinence   . Ventricular septal defect      Patient Active Problem List   Diagnosis Date Noted  . Morbid obesity (HCC) 10/08/2015  . Cystocele 10/08/2015  . SUI (stress urinary incontinence, female) 10/08/2015  . Menopause 10/08/2015  . Urinary frequency 08/06/2015  . Microscopic hematuria 08/06/2015  . Cystocele, grade 2 08/06/2015   . Morbid (severe) obesity due to excess calories (HCC) 05/15/2015  . TIA (transient ischemic attack) 01/01/2015  . Anterior chest wall pain 09/08/2014  . Nicotine addiction 08/19/2014  . Benign essential HTN 08/04/2014  . Combined fat and carbohydrate induced hyperlipemia 02/12/2014  . 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 Disease of lung 09/06/2013  . Heart valve disease 09/06/2013  . Chronic obstructive pulmonary disease (HCC) 02/19/2013  . Dependence on supplemental oxygen 02/19/2013  . Pulmonary hypertension (HCC) 02/19/2013  . Postprocedural state 02/19/2013  . Other specified postprocedural states 02/19/2013  . 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 Chronic pain 07/23/2012  . Diabetes mellitus (HCC) 07/23/2012  . Clinical depression 09/02/2009  . Major depressive disorder with single episode 09/02/2009  . Essential (primary) hypertension 06/01/2001     Past Surgical History:  Procedure Laterality Date  . CARDIAC SURGERY    . CYST EXCISION    . LUMBAR DISC SURGERY    . WRIST SURGERY Bilateral      Prior to Admission medications   Medication Sig Start Date End Date Taking? Authorizing Provider  acyclovir (ZOVIRAX) 400 MG tablet Take 400 mg by mouth 2 (two) times daily.   Yes [provider]  albuterol (PROVENTIL HFA;VENTOLIN HFA) 108 (90 BASE) MCG/ACT inhaler Inhale 1-2 puffs into the lungs every 6 (six) hours as needed for wheezing or shortness of breath.   Yes [provider]  ALPRAZolam Prudy Feeler)  1 MG tablet Take 1 mg by mouth 3 (three) times daily as needed for anxiety. 3 to 4 times daily PRN   Yes [provider]  amLODipine (NORVASC) 5 MG tablet Take 5 mg by mouth daily.   Yes [provider]  aspirin 81 MG tablet Take 1 tablet (81 mg total) by mouth daily. 01/02/15  Yes Altamese Dilling, MD  atorvastatin (LIPITOR) 20 MG tablet Take 20 mg by mouth at bedtime. 08/31/16  Yes [provider]   budesonide-formoterol (SYMBICORT) 160-4.5 MCG/ACT inhaler Inhale 2 puffs into the lungs 2 (two) times daily.  11/10/15  Yes [provider]  enalapril (VASOTEC) 5 MG tablet Take 5 mg by mouth daily.   Yes [provider]  famotidine (PEPCID) 20 MG tablet take 1 tablet by mouth once daily 07/05/16  Yes [provider]  FLUoxetine HCl 60 MG TABS Take 60 mg by mouth daily.   Yes [provider]  fluticasone (FLONASE) 50 MCG/ACT nasal spray Place 1 spray into both nostrils daily as needed.  06/04/15  Yes [provider]  furosemide (LASIX) 40 MG tablet Take 40 mg by mouth daily.   Yes [provider]  gabapentin (NEURONTIN) 300 MG capsule Take 300 mg by mouth 3 (three) times daily.  08/25/15  Yes [provider]  insulin glargine (LANTUS) 100 UNIT/ML injection Inject 80 Units into the skin at bedtime.  08/25/15  Yes [provider]  insulin lispro (HUMALOG) 100 UNIT/ML injection Inject 4-10 Units into the skin 3 (three) times daily with meals. Sliding scale   Yes [provider]  metoprolol tartrate (LOPRESSOR) 25 MG tablet Take 25 mg by mouth 2 (two) times daily. Reported on 08/06/2015   Yes [provider]  naproxen (EC NAPROSYN) 500 MG EC tablet Take 1 tablet (500 mg total) by mouth 2 (two) times daily with a meal. 10/18/15  Yes Menshew, Jenise V Bacon, PA-C  NICODERM CQ 7 MG/24HR patch Place 7 mg onto the skin daily.  04/28/16  Yes [provider]  NICOTROL 10 MG inhaler  08/16/16  Yes [provider]  ondansetron (ZOFRAN) 4 MG tablet  04/21/16  Yes [provider]  oxyCODONE-acetaminophen (PERCOCET/ROXICET) 5-325 MG per tablet Take 1 tablet by mouth every 6 (six) hours as needed for severe pain.   Yes [provider]  polyethylene glycol powder (GLYCOLAX/MIRALAX) powder Take 17 g by mouth daily as needed.  11/06/15  Yes [provider]  tiotropium (SPIRIVA) 18 MCG inhalation  capsule Place 18 mcg into inhaler and inhale daily.    Yes [provider]  traZODone (DESYREL) 100 MG tablet Take by mouth at bedtime as needed.    Yes [provider]  fluconazole (DIFLUCAN) 150 MG tablet Take 1 tablet (150 mg total) by mouth once a week. For 3 weeks Patient not taking: Reported on 09/10/2016 07/14/16   Defrancesco, Prentice Docker, MD  ondansetron (ZOFRAN ODT) 4 MG disintegrating tablet Take 1 tablet (4 mg total) by mouth every 8 (eight) hours as needed for nausea or vomiting. 09/10/16   Sharman Cheek, MD     Allergies Sulfa antibiotics; Amoxicillin; Penicillins; and Shellfish allergy   Family History  Problem Relation Age of Onset  . Diabetes Unknown        2 Brother 2 Sister  . Hypertension Unknown   . Kidney disease Neg Hx   . Bladder Cancer Neg Hx     Social History Social History  Substance Use Topics  .  Smoking status: Light Tobacco Smoker    Types: Cigarettes  . Smokeless tobacco: Never Used  . Alcohol use No    Review of Systems  Constitutional:   No fever or chills.  ENT:   No sore throat. No rhinorrhea. Cardiovascular:   No chest pain or syncope. Respiratory:   No dyspnea or cough. Gastrointestinal:   Positive as above for abdominal pain with diarrhea. No vomiting.  Musculoskeletal:   Negative for focal pain or swelling All other systems reviewed and are negative except as documented above in ROS and HPI.  ____________________________________________   PHYSICAL EXAM:  VITAL SIGNS: ED Triage Vitals [09/10/16 0913]  Enc Vitals Group     BP (!) 150/65     Pulse Rate 86     Resp 18     Temp 99 F (37.2 C)     Temp Source Oral     SpO2 94 %     Weight 240 lb (108.9 kg)     Height 5\' 2"  (1.575 m)     Head Circumference      Peak Flow      Pain Score 8     Pain Loc      Pain Edu?      Excl. in GC?     Vital signs reviewed, nursing assessments reviewed.   Constitutional:   Alert and oriented. Well appearing and in no  distress. Eyes:   No scleral icterus. No conjunctival pallor. PERRL. EOMI.  No nystagmus. ENT   Head:   Normocephalic and atraumatic.   Nose:   No congestion/rhinnorhea.    Mouth/Throat:   MMM, no pharyngeal erythema. No peritonsillar mass.    Neck:   No meningismus. Full ROM Hematological/Lymphatic/Immunilogical:   No cervical lymphadenopathy. Cardiovascular:   RRR. Symmetric bilateral radial and DP pulses.  No murmurs.  Respiratory:   Normal respiratory effort without tachypnea/retractions. Breath sounds are clear and equal bilaterally. No wheezes/rales/rhonchi. Gastrointestinal:   Soft With generalized tenderness. Non distended. There is no CVA tenderness.  No rebound, rigidity, or guarding. Genitourinary:   deferred Musculoskeletal:   Normal range of motion in all extremities. No joint effusions.  No lower extremity tenderness.  No edema. Neurologic:   Normal speech and language.  Motor grossly intact. No gross focal neurologic deficits are appreciated.  Skin:    Skin is warm, dry and intact. No rash noted.  No petechiae, purpura, or bullae.  ____________________________________________    LABS (pertinent positives/negatives) (all labs ordered are listed, but only abnormal results are displayed) Labs Reviewed  COMPREHENSIVE METABOLIC PANEL - Abnormal; Notable for the following:       Result Value   Chloride 100 (*)    Glucose, Bld 255 (*)    Creatinine, Ser 1.03 (*)    GFR calc non Af Amer 60 (*)    All other components within normal limits  CBC WITH DIFFERENTIAL/PLATELET - Abnormal; Notable for the following:    Hemoglobin 11.2 (*)    HCT 33.9 (*)    RDW 14.7 (*)    All other components within normal limits  URINALYSIS, COMPLETE (UACMP) WITH MICROSCOPIC - Abnormal; Notable for the following:    Color, Urine YELLOW (*)    APPearance CLEAR (*)    Glucose, UA 50 (*)    Protein, ur 30 (*)    Squamous Epithelial / LPF 0-5 (*)    All other components within  normal limits  URINE CULTURE  LIPASE, BLOOD   ____________________________________________   EKG  ____________________________________________    RADIOLOGY  Ct Abdomen Pelvis W Contrast  Result Date: 09/10/2016 CLINICAL DATA:  56 year old female with abdominal and pelvic pain for 3 weeks. EXAM: CT ABDOMEN AND PELVIS WITH CONTRAST TECHNIQUE: Multidetector CT imaging of the abdomen and pelvis was performed using the standard protocol following bolus administration of intravenous contrast. CONTRAST:  100 cc intravenous Isovue-300 COMPARISON:  08/21/2015 and prior CTs FINDINGS: Lower chest: No acute abnormality.  Cardiomegaly again noted. Hepatobiliary: Mild hepatic steatosis again noted. No focal hepatic abnormalities are present. The gallbladder is unremarkable. There is no evidence of biliary dilatation. Pancreas: Unremarkable Spleen: Unremarkable Adrenals/Urinary Tract: The kidneys, adrenal glands and bladder are unremarkable. Stomach/Bowel: Stomach is within normal limits. Appendix appears normal. No evidence of bowel wall thickening, distention, or inflammatory changes. Vascular/Lymphatic: Aortic atherosclerotic calcifications noted without aneurysm. No enlarged lymph nodes identified. Reproductive: Uterus and bilateral adnexa are unremarkable. A pessary is present. Other: No free fluid, focal collection or pneumoperitoneum. A stable moderate to large midline upper abdominal ventral hernia noted containing only fat. Musculoskeletal: No acute or suspicious abnormality. Posterior fusion changes at L4-5 noted. IMPRESSION: No evidence of acute abnormality. Cardiomegaly, mild hepatic steatosis and moderate to large midline upper abdominal ventral hernia containing fat. Abdominal aortic atherosclerosis. Electronically Signed   By: Harmon PierJeffrey  Hu M.D.   On: 09/10/2016 12:49     ____________________________________________   PROCEDURES Procedures  ____________________________________________   INITIAL IMPRESSION / ASSESSMENT AND PLAN / ED COURSE  Pertinent labs & imaging results that were available during my care of the patient were reviewed by me and considered in my medical decision making (see chart for details).  Patient presents with generalized abdominal pain. Symptoms most likely due to urinary tract infection. We'll check labs and urinalysis. If she has a UTI and labs are reassuring she can be discharged on antibiotics. Otherwise, will need a CT scan. IV fluids and Zofran for now.  Clinical Course as of Sep 10 1332  Sat Sep 10, 2016  1041 Labs, UA neg. Will get CT  [PS]    Clinical Course User Index [PS] Sharman CheekStafford, Jameshia Hayashida, MD      ----------------------------------------- 1:34 PM on 09/10/2016 -----------------------------------------  Workup negative. CT negative. We'll discharge home with symptom control. Follow up with primary care. ____________________________________________   FINAL CLINICAL IMPRESSION(S) / ED DIAGNOSES  Final diagnoses:  Generalized abdominal pain      New Prescriptions   ONDANSETRON (ZOFRAN ODT) 4 MG DISINTEGRATING TABLET    Take 1 tablet (4 mg total) by mouth every 8 (eight) hours as needed for nausea or vomiting.     Portions of this note were generated with dragon dictation software. Dictation errors may occur despite best attempts at proofreading.    Sharman CheekStafford, Jayvyn Haselton, MD 09/10/16 [phone]

## 2016-09-10 NOTE — ED Notes (Signed)
Pt ambulated to toilet with minimal assistance (sore from her back surgery)

## 2016-09-10 NOTE — Discharge Instructions (Signed)
Your lab tests and CT scan of the abdomen today were unremarkable. Follow-up with your doctor for continued monitoring of your symptoms.

## 2016-09-10 NOTE — ED Triage Notes (Signed)
Pt to ed with c/o abd pain x 3 weeks, +nausea.

## 2016-09-11 LAB — URINE CULTURE: Culture: NO GROWTH

## 2016-09-14 ENCOUNTER — Emergency Department: Payer: Medicare Other

## 2016-09-14 ENCOUNTER — Encounter: Payer: Self-pay | Admitting: Emergency Medicine

## 2016-09-14 ENCOUNTER — Emergency Department
Admission: EM | Admit: 2016-09-14 | Discharge: 2016-09-14 | Disposition: A | Discharge status: Home / Self Care | Payer: Medicare Other | Attending: Emergency Medicine | Admitting: Emergency Medicine

## 2016-09-14 DIAGNOSIS — I129 Hypertensive chronic kidney disease with stage 1 through stage 4 chronic kidney disease, or unspecified chronic kidney disease: Secondary | ICD-10-CM | POA: Diagnosis not present

## 2016-09-14 DIAGNOSIS — Z8673 Personal history of transient ischemic attack (TIA), and cerebral infarction without residual deficits: Secondary | ICD-10-CM | POA: Diagnosis not present

## 2016-09-14 DIAGNOSIS — M25532 Pain in left wrist: Secondary | ICD-10-CM | POA: Diagnosis present

## 2016-09-14 DIAGNOSIS — Z7984 Long term (current) use of oral hypoglycemic drugs: Secondary | ICD-10-CM | POA: Insufficient documentation

## 2016-09-14 DIAGNOSIS — I2510 Atherosclerotic heart disease of native coronary artery without angina pectoris: Secondary | ICD-10-CM | POA: Diagnosis not present

## 2016-09-14 DIAGNOSIS — E1122 Type 2 diabetes mellitus with diabetic chronic kidney disease: Secondary | ICD-10-CM | POA: Diagnosis not present

## 2016-09-14 DIAGNOSIS — J45909 Unspecified asthma, uncomplicated: Secondary | ICD-10-CM | POA: Diagnosis not present

## 2016-09-14 DIAGNOSIS — N189 Chronic kidney disease, unspecified: Secondary | ICD-10-CM | POA: Insufficient documentation

## 2016-09-14 DIAGNOSIS — Z79899 Other long term (current) drug therapy: Secondary | ICD-10-CM | POA: Insufficient documentation

## 2016-09-14 DIAGNOSIS — J449 Chronic obstructive pulmonary disease, unspecified: Secondary | ICD-10-CM | POA: Diagnosis not present

## 2016-09-14 DIAGNOSIS — F17210 Nicotine dependence, cigarettes, uncomplicated: Secondary | ICD-10-CM | POA: Diagnosis not present

## 2016-09-14 DIAGNOSIS — Z794 Long term (current) use of insulin: Secondary | ICD-10-CM | POA: Insufficient documentation

## 2016-09-14 IMAGING — DX DG WRIST COMPLETE 3+V*L*
4 series · 4 of 4 positions shown · non-contrast
Comparison: No recent prior.

CLINICAL DATA: Left rib pain.  No known injury.

EXAM:
LEFT WRIST - COMPLETE 3+ VIEW

[wrist ap (1 of 2)]
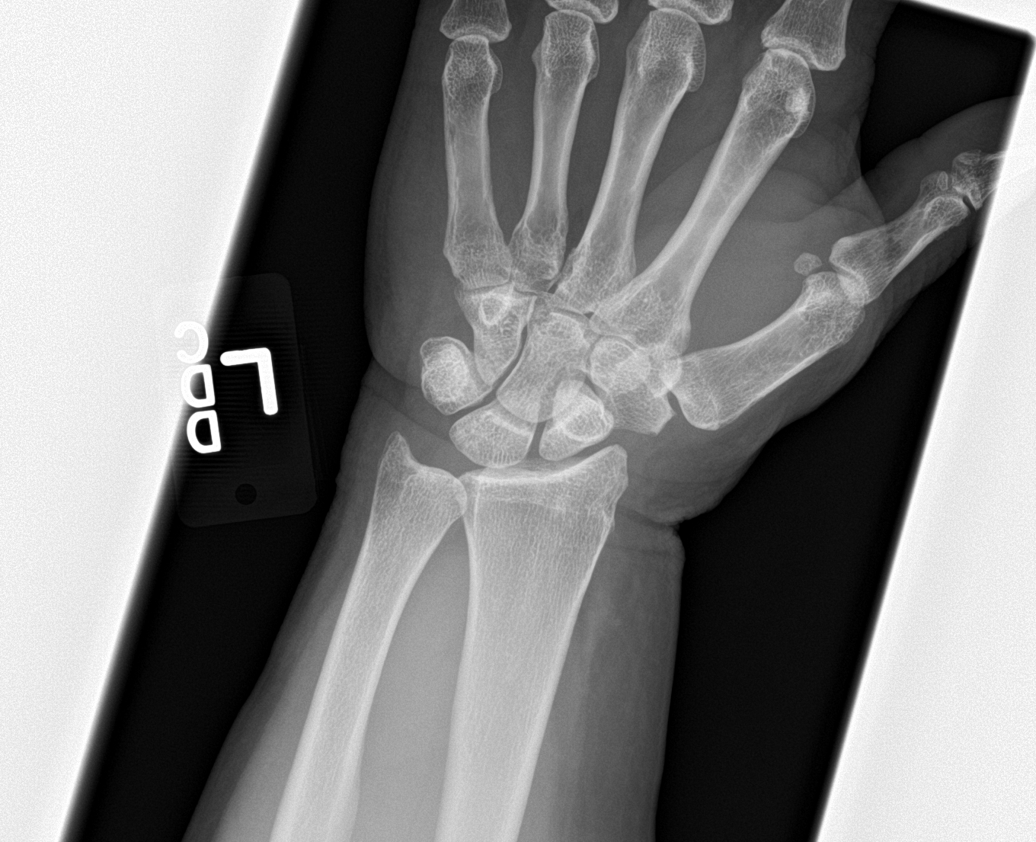

[wrist obl]
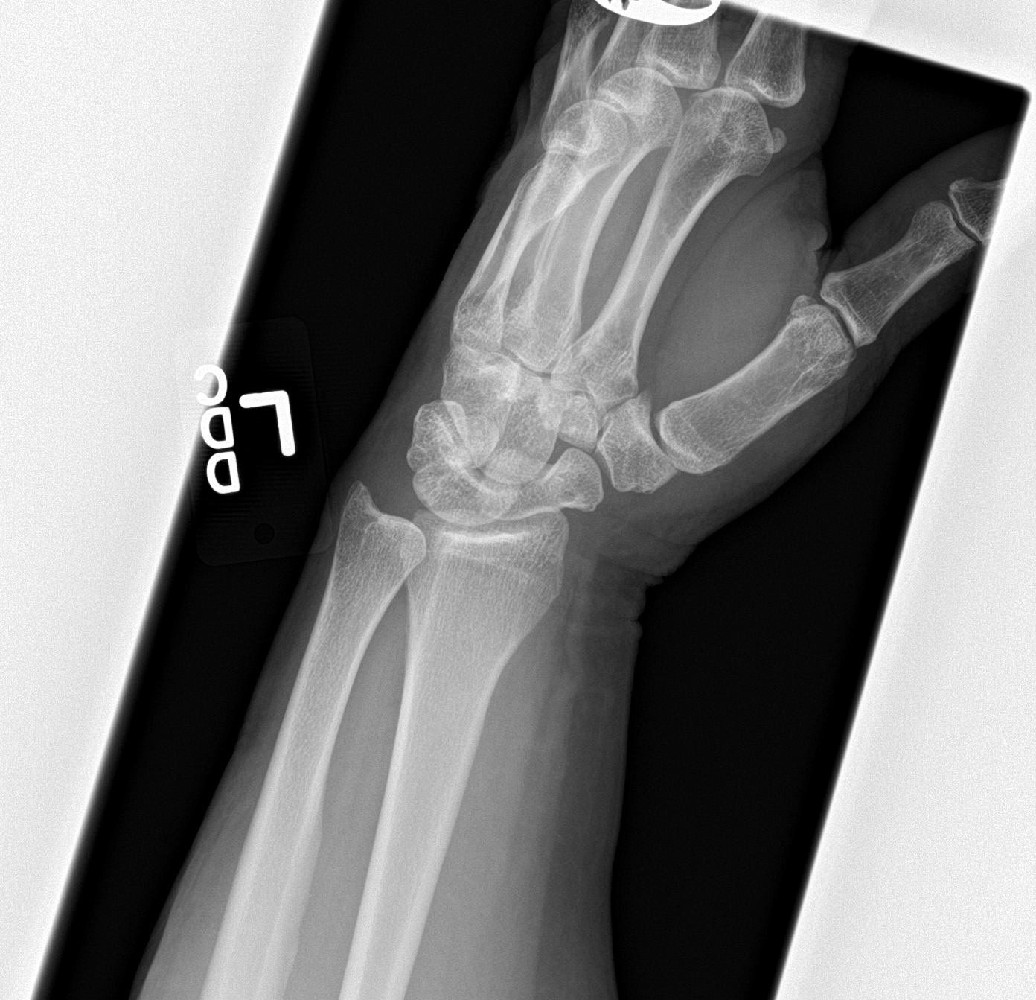

[wrist lat]
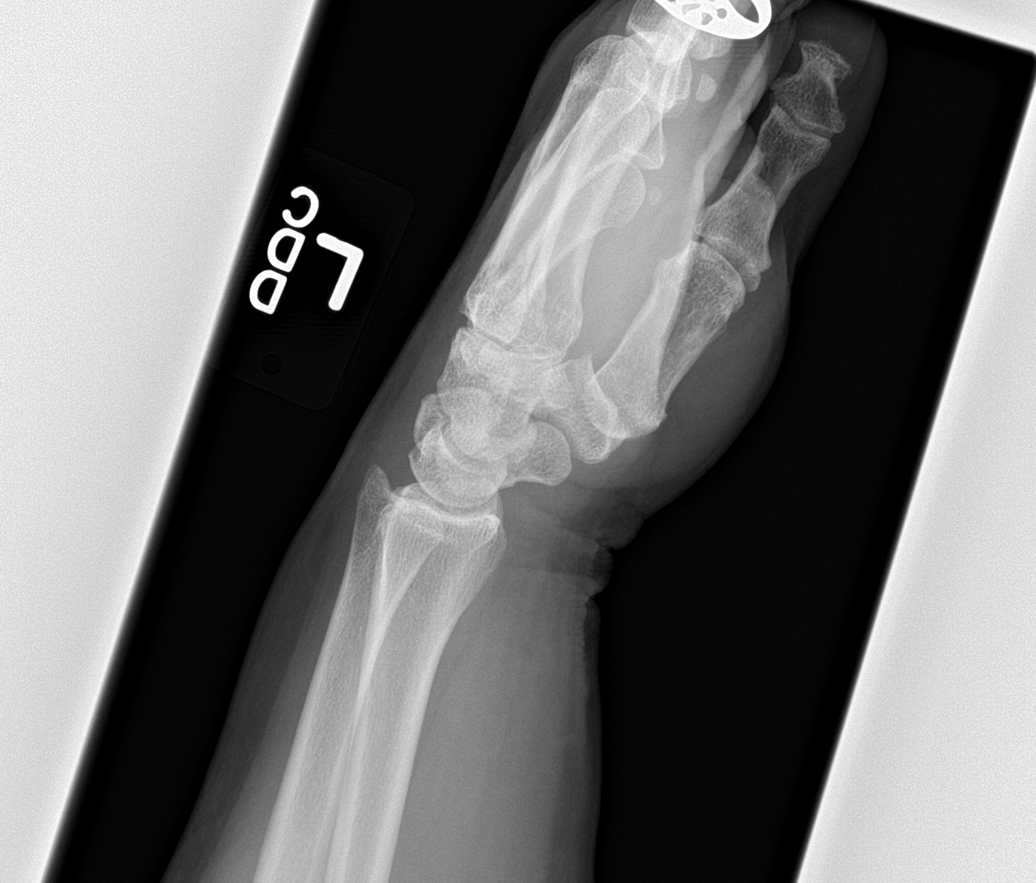

[wrist ap (2 of 2)]
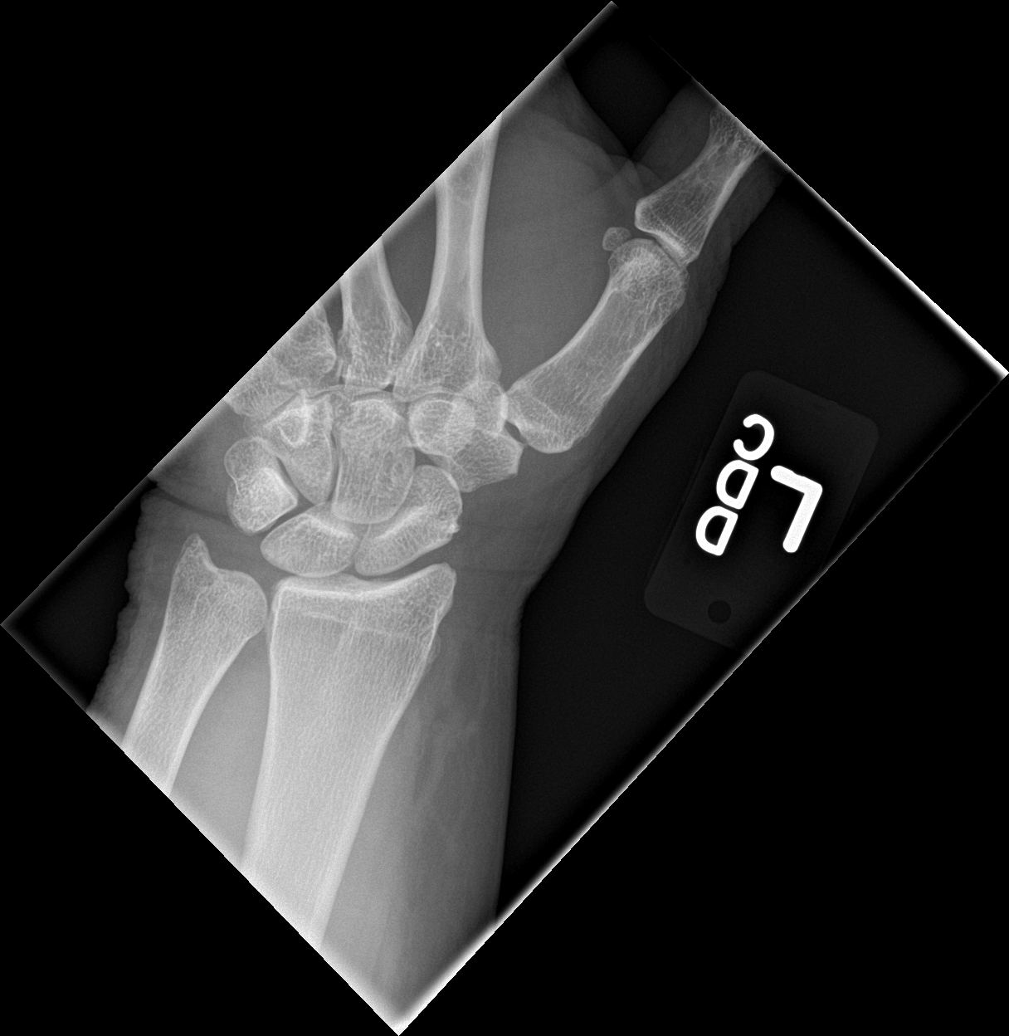

[4 of 4 positions shown; findings below may reference images not displayed]

FINDINGS: No acute bony abnormality identified. No evidence of fracture or
dislocation.
IMPRESSION: No acute or focal abnormality.

## 2016-09-14 NOTE — ED Provider Notes (Signed)
[facility] Emergency Department Provider Note   ____________________________________________   First MD Initiated Contact with Patient 09/14/16 1105     (approximate)  I have reviewed the triage vital signs and the nursing notes.   HISTORY  Chief Complaint Wrist Pain    HPI Teresa [name] is a 56 y.o. female is her complaint of left wrist pain for one month.Patient denies any history of injury. She states that she woke with pain. She denies any past history of present pain. Currently she is taking her regular medications which also includes oxycodone. She has been using a neoprene sleeve for her wrist which is minimal support. She rates her pain as a 9 out of 10.   Past Medical History:  Diagnosis Date  . Anxiety disorder   . Arthritis   . Asthma   . Bilateral leg edema   . CAD (coronary artery disease)   . Chest pain   . Chronic back pain   . Chronic kidney disease   . COPD (chronic obstructive pulmonary disease) (HCC)   . DDD (degenerative disc disease), lumbar   . Depression   . Diabetes (HCC)   . Diabetic retinopathy (HCC)   . Genital herpes   . Heart burn   . Heart disease   . Heart murmur   . Hepatitis   . HLD (hyperlipidemia)   . Hypertension   . Impetigo   . Microscopic hematuria   . Obesity, morbid (HCC)   . Pancreatitis   . Peripheral neuropathy   . Sleep apnea   . Thyroid disease   . Urinary frequency   . Urinary incontinence   . Ventricular septal defect     Patient Active Problem List   Diagnosis Date Noted  . Morbid obesity (HCC) 10/08/2015  . Cystocele 10/08/2015  . SUI (stress urinary incontinence, female) 10/08/2015  . Menopause 10/08/2015  . Urinary frequency 08/06/2015  . Microscopic hematuria 08/06/2015  . Cystocele, grade 2 08/06/2015  . Morbid (severe) obesity due to excess calories (HCC) 05/15/2015  . TIA (transient ischemic attack) 01/01/2015  . Anterior chest wall pain 09/08/2014  . Nicotine  addiction 08/19/2014  . Benign essential HTN 08/04/2014  . Combined fat and carbohydrate induced hyperlipemia 02/12/2014  . 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 Disease of lung 09/06/2013  . Heart valve disease 09/06/2013  . Chronic obstructive pulmonary disease (HCC) 02/19/2013  . Dependence on supplemental oxygen 02/19/2013  . Pulmonary hypertension (HCC) 02/19/2013  . Postprocedural state 02/19/2013  . Other specified postprocedural states 02/19/2013  . 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 Chronic pain 07/23/2012  . Diabetes mellitus (HCC) 07/23/2012  . Clinical depression 09/02/2009  . Major depressive disorder with single episode 09/02/2009  . Essential (primary) hypertension 06/01/2001    Past Surgical History:  Procedure Laterality Date  . CARDIAC SURGERY    . CYST EXCISION    . LUMBAR DISC SURGERY    . WRIST SURGERY Bilateral     Prior to Admission medications   Medication Sig Start Date End Date Taking? Authorizing Provider  acyclovir (ZOVIRAX) 400 MG tablet Take 400 mg by mouth 2 (two) times daily.    [provider]  albuterol (PROVENTIL HFA;VENTOLIN HFA) 108 (90 BASE) MCG/ACT inhaler Inhale 1-2 puffs into the lungs every 6 (six) hours as needed for wheezing or shortness of breath.    [provider]  ALPRAZolam Prudy Feeler) 1 MG tablet Take 1 mg by mouth 3 (three) times daily as needed for  anxiety. 3 to 4 times daily PRN    [provider]  amLODipine (NORVASC) 5 MG tablet Take 5 mg by mouth daily.    [provider]  aspirin 81 MG tablet Take 1 tablet (81 mg total) by mouth daily. 01/02/15   Altamese Dilling, MD  atorvastatin (LIPITOR) 20 MG tablet Take 20 mg by mouth at bedtime. 08/31/16   [provider]  budesonide-formoterol (SYMBICORT) 160-4.5 MCG/ACT inhaler Inhale 2 puffs into the lungs 2 (two) times daily.  11/10/15   [provider]  enalapril (VASOTEC) 5 MG tablet Take 5 mg by mouth daily.     [provider]  famotidine (PEPCID) 20 MG tablet take 1 tablet by mouth once daily 07/05/16   [provider]  FLUoxetine HCl 60 MG TABS Take 60 mg by mouth daily.    [provider]  fluticasone (FLONASE) 50 MCG/ACT nasal spray Place 1 spray into both nostrils daily as needed.  06/04/15   [provider]  furosemide (LASIX) 40 MG tablet Take 40 mg by mouth daily.    [provider]  gabapentin (NEURONTIN) 300 MG capsule Take 300 mg by mouth 3 (three) times daily.  08/25/15   [provider]  insulin glargine (LANTUS) 100 UNIT/ML injection Inject 80 Units into the skin at bedtime.  08/25/15   [provider]  insulin lispro (HUMALOG) 100 UNIT/ML injection Inject 4-10 Units into the skin 3 (three) times daily with meals. Sliding scale    [provider]  metoprolol tartrate (LOPRESSOR) 25 MG tablet Take 25 mg by mouth 2 (two) times daily. Reported on 08/06/2015    [provider]  naproxen (EC NAPROSYN) 500 MG EC tablet Take 1 tablet (500 mg total) by mouth 2 (two) times daily with a meal. 10/18/15   Menshew, Jenise V Bacon, PA-C  NICODERM CQ 7 MG/24HR patch Place 7 mg onto the skin daily.  04/28/16   [provider]  NICOTROL 10 MG inhaler  08/16/16   [provider]  ondansetron (ZOFRAN ODT) 4 MG disintegrating tablet Take 1 tablet (4 mg total) by mouth every 8 (eight) hours as needed for nausea or vomiting. 09/10/16   Sharman Cheek, MD  ondansetron [facility]) 4 MG tablet  04/21/16   [provider]  oxyCODONE-acetaminophen (PERCOCET/ROXICET) 5-325 MG per tablet Take 1 tablet by mouth every 6 (six) hours as needed for severe pain.    [provider]  polyethylene glycol powder (GLYCOLAX/MIRALAX) powder Take 17 g by mouth daily as needed.  11/06/15   [provider]  tiotropium (SPIRIVA) 18 MCG inhalation capsule Place 18 mcg into inhaler and inhale daily.     [provider]    traZODone (DESYREL) 100 MG tablet Take by mouth at bedtime as needed.     [provider]    Allergies Sulfa antibiotics; Amoxicillin; Penicillins; and Shellfish allergy  Family History  Problem Relation Age of Onset  . Diabetes Unknown        2 Brother 2 Sister  . Hypertension Unknown   . Kidney disease Neg Hx   . Bladder Cancer Neg Hx     Social History Social History  Substance Use Topics  . Smoking status: Light Tobacco Smoker    Types: Cigarettes  . Smokeless tobacco: Never Used  . Alcohol use No    Review of Systems  Constitutional: No fever/chills Cardiovascular: Denies chest pain. Respiratory: Denies shortness of breath. Musculoskeletal: Positive for left wrist pain. Skin:  Negative for rash. Neurological: Negative for  focal weakness or numbness.   ____________________________________________   PHYSICAL EXAM:  VITAL SIGNS: ED Triage Vitals  Enc Vitals Group     BP 09/14/16 1028 (!) 96/58     Pulse Rate 09/14/16 1028 90     Resp 09/14/16 1028 18     Temp 09/14/16 1028 98.6 F (37 C)     Temp Source 09/14/16 1028 Oral     SpO2 09/14/16 1028 98 %     Weight 09/14/16 1029 240 lb (108.9 kg)     Height 09/14/16 1029 5\' 2"  (1.575 m)     Head Circumference --      Peak Flow --      Pain Score 09/14/16 1027 9     Pain Loc --      Pain Edu? --      Excl. in GC? --     Constitutional: Alert and oriented. Well appearing and in no acute distress. Eyes: Conjunctivae are normal. PERRL. EOMI. Head: Atraumatic. Neck: No stridor.   Cardiovascular: Normal rate, regular rhythm. Grossly normal heart sounds.  Good peripheral circulation. Respiratory: Normal respiratory effort.  No retractions. Lungs CTAB. Musculoskeletal: On examination of the left wrist shows no gross deformity noted. There is no soft tissue swelling present. Patient is able to flex and extend without restriction. Patient is also able to move wrist laterally without restriction. There is  some tenderness on palpation of the distal radius and carpal area. There is no erythema or warmth noted in the area. There is no ecchymosis or abrasions seen. There is sensory function intact. Range of motion of the digits distal to this area is all within normal limits. Neurologic:  Normal speech and language. No gross focal neurologic deficits are appreciated. No gait instability. Skin:  Skin is warm, dry and intact. As noted above. Psychiatric: Mood and affect are normal. Speech and behavior are normal.  ____________________________________________   LABS (all labs ordered are listed, but only abnormal results are displayed)  Labs Reviewed - No data to display  RADIOLOGY  Dg Wrist Complete Left  Result Date: 09/14/2016 CLINICAL DATA:  Left rib pain.  No known injury. EXAM: LEFT WRIST - COMPLETE 3+ VIEW COMPARISON:  No recent prior. FINDINGS: No acute bony abnormality identified. No evidence of fracture or dislocation. IMPRESSION: No acute or focal abnormality. Electronically Signed   By: Maisie Fus  Register   On: 09/14/2016 10:56    ____________________________________________   PROCEDURES  Procedure(s) performed: None  Procedures  Critical Care performed: No  ____________________________________________   INITIAL IMPRESSION / ASSESSMENT AND PLAN / ED COURSE  Pertinent labs & imaging results that were available during my care of the patient were reviewed by me and considered in my medical decision making (see chart for details).  Patient was placed in a cock up wrist splint. She is already taking oxycodone at home and she is to continue her regular medication and follow up with her PCP in [facility] if any continued problems. She was reassured that her x-rays did not show any acute changes in her bones.      ____________________________________________   FINAL CLINICAL IMPRESSION(S) / ED DIAGNOSES  Final diagnoses:  Acute pain of left wrist      NEW MEDICATIONS  STARTED DURING THIS VISIT:  Current Discharge Medication List       Note:  This document was prepared using Dragon voice recognition software and may include unintentional dictation errors.    Levada Schilling,  Kallie LocksRhonda L, PA-C 09/14/16 1224    Tommi RumpsSummers, Rhonda L, PA-C 09/14/16 1225    Charlynne PanderYao, David Hsienta, MD 09/14/16 [phone]

## 2016-09-14 NOTE — Discharge Instructions (Signed)
[facility]. Wear wrist brace for extra support. Ice if needed for pain. Continue your regular medication.

## 2016-09-14 NOTE — ED Triage Notes (Signed)
Patient presents to the ED with left wrist pain x 1 month.  Patient is in no obvious distress at this time.  Patient denies known injury but states she may have injured her wrist without realizing it.  Patient is in no obvious distress at this time.

## 2016-09-14 NOTE — ED Notes (Signed)
Pt discharged home after verbalizing understanding of discharge instructions; nad noted. 

## 2016-09-14 NOTE — ED Notes (Signed)
Left wrist pain. No obvious injury. Pt reports pain in wrist joint radiating to thumb. Pt with full ROM

## 2016-09-26 ENCOUNTER — Ambulatory Visit: Payer: Medicare Other

## 2016-09-28 ENCOUNTER — Ambulatory Visit: Payer: Medicare Other

## 2016-09-29 ENCOUNTER — Ambulatory Visit: Payer: Medicare Other | Admitting: Urology

## 2016-10-20 ENCOUNTER — Ambulatory Visit: Payer: Medicare Other

## 2016-10-21 ENCOUNTER — Ambulatory Visit (INDEPENDENT_AMBULATORY_CARE_PROVIDER_SITE_OTHER): Payer: Medicare Other | Admitting: Urology

## 2016-10-21 ENCOUNTER — Encounter: Payer: Self-pay | Admitting: Urology

## 2016-10-21 VITALS — BP 114/66 | HR 94 | Ht 62.0 in | Wt 237.7 lb

## 2016-10-21 DIAGNOSIS — R3129 Other microscopic hematuria: Secondary | ICD-10-CM

## 2016-10-21 LAB — URINALYSIS, COMPLETE
Bilirubin, UA: NEGATIVE
GLUCOSE, UA: NEGATIVE
Ketones, UA: NEGATIVE
Leukocytes, UA: NEGATIVE
Nitrite, UA: NEGATIVE
PH UA: 5.5 (ref 5.0–7.5)
PROTEIN UA: NEGATIVE
Specific Gravity, UA: 1.02 (ref 1.005–1.030)
Urobilinogen, Ur: 0.2 mg/dL (ref 0.2–1.0)

## 2016-10-21 LAB — MICROSCOPIC EXAMINATION
Bacteria, UA: NONE SEEN
RBC MICROSCOPIC, UA: NONE SEEN /HPF (ref 0–?)
WBC, UA: NONE SEEN /hpf (ref 0–?)

## 2016-10-21 NOTE — Progress Notes (Signed)
In and Out Catheterization  Patient is present today for a I & O catheterization due to needing a clean catch urinalysis. Patient was cleaned and prepped in a sterile fashion with betadine and Lidocaine 2% jelly was instilled into the urethra.  A 14FR cath was inserted no complications were noted , 50ml of urine return was noted, urine was yellow in color. A clean urine sample was collected for one year follow up for hematuria. Bladder was drained and catheter was removed with out difficulty.    Preformed by: Dallas Schimkeamona Nayra Coury CMA

## 2016-10-21 NOTE — Progress Notes (Signed)
10/21/2016 1:53 PM   Teresa [name] Nov 22, 1960 [id number]  Referring provider: Veneda Melter, FNP [address] [place], [place] [address]  Chief Complaint  Patient presents with  . Follow-up    one year micro heme    HPI: The patient is a 56 year old female presents today for follow-up after undergoing a negative microscopic hematuria workup in June 2017.  She presents today for annual follow-up. She denies any gross hematuria, nephrolithiasis, UTIs, or other complaints of the last year. She does have a pessary in place per gynecology which she finds very helpful. She has done well in the last year with no major changes to her health.  Urinalysis today is without microscopic hematuria.  Patient is a smoker.   PMH: Past Medical History:  Diagnosis Date  . Anxiety disorder   . Arthritis   . Asthma   . Bilateral leg edema   . CAD (coronary artery disease)   . Chest pain   . Chronic back pain   . Chronic kidney disease   . COPD (chronic obstructive pulmonary disease) (HCC)   . DDD (degenerative disc disease), lumbar   . Depression   . Diabetes (HCC)   . Diabetic retinopathy (HCC)   . Genital herpes   . Heart burn   . Heart disease   . Heart murmur   . Hepatitis   . HLD (hyperlipidemia)   . Hypertension   . Impetigo   . Microscopic hematuria   . Obesity, morbid (HCC)   . Pancreatitis   . Peripheral neuropathy   . Sleep apnea   . Thyroid disease   . Urinary frequency   . Urinary incontinence   . Ventricular septal defect     Surgical History: Past Surgical History:  Procedure Laterality Date  . CARDIAC SURGERY    . CYST EXCISION    . LUMBAR DISC SURGERY    . WRIST SURGERY Bilateral     Home Medications:  Allergies as of 10/21/2016      Reactions   Sulfa Antibiotics Shortness Of Breath   Amoxicillin Other (See Comments)   Body shakes   Morphine Other (See Comments)   Hallucinations Hallucinations   Penicillins Other (See Comments)   Body shakes   Shellfish Allergy Swelling      Medication List       Accurate as of 10/21/16  1:53 PM. Always use your most recent med list.          acyclovir 400 MG tablet Commonly known as:  ZOVIRAX Take 400 mg by mouth 2 (two) times daily.   albuterol 108 (90 Base) MCG/ACT inhaler Commonly known as:  PROVENTIL HFA;VENTOLIN HFA Inhale 1-2 puffs into the lungs every 6 (six) hours as needed for wheezing or shortness of breath.   ALPRAZolam 1 MG tablet Commonly known as:  XANAX Take 1 mg by mouth 3 (three) times daily as needed for anxiety. 3 to 4 times daily PRN   amLODipine 5 MG tablet Commonly known as:  NORVASC Take 5 mg by mouth daily.   aspirin 81 MG tablet Take 1 tablet (81 mg total) by mouth daily.   atorvastatin 20 MG tablet Commonly known as:  LIPITOR Take 20 mg by mouth at bedtime.   enalapril 5 MG tablet Commonly known as:  VASOTEC Take 5 mg by mouth daily.   famotidine 20 MG tablet Commonly known as:  PEPCID take 1 tablet by mouth once daily   fluconazole 150 MG tablet Commonly known  as:  DIFLUCAN   FLUoxetine HCl 60 MG Tabs Take 60 mg by mouth daily.   fluticasone 50 MCG/ACT nasal spray Commonly known as:  FLONASE Place 1 spray into both nostrils daily as needed.   furosemide 40 MG tablet Commonly known as:  LASIX Take 40 mg by mouth daily.   gabapentin 300 MG capsule Commonly known as:  NEURONTIN Take 300 mg by mouth 3 (three) times daily.   insulin lispro 100 UNIT/ML injection Commonly known as:  HUMALOG Inject 4-10 Units into the skin 3 (three) times daily with meals. Sliding scale   LANTUS 100 UNIT/ML injection Generic drug:  insulin glargine Inject 80 Units into the skin at bedtime.   metoprolol tartrate 25 MG tablet Commonly known as:  LOPRESSOR Take 25 mg by mouth 2 (two) times daily. Reported on 08/06/2015   naproxen 500 MG EC tablet Commonly known as:  EC NAPROSYN Take 1 tablet (500 mg total) by mouth 2 (two) times daily  with a meal.   NICODERM CQ 7 mg/24hr patch Generic drug:  nicotine Place 7 mg onto the skin daily.   NICOTROL 10 MG inhaler Generic drug:  nicotine   ondansetron 4 MG disintegrating tablet Commonly known as:  ZOFRAN ODT Take 1 tablet (4 mg total) by mouth every 8 (eight) hours as needed for nausea or vomiting.   ondansetron 4 MG tablet Commonly known as:  ZOFRAN   oxyCODONE-acetaminophen 5-325 MG tablet Commonly known as:  PERCOCET/ROXICET Take 1 tablet by mouth every 6 (six) hours as needed for severe pain.   OXYGEN Inhale 2 Units into the lungs.   polyethylene glycol powder powder Commonly known as:  GLYCOLAX/MIRALAX Take 17 g by mouth daily as needed.   SYMBICORT 160-4.5 MCG/ACT inhaler Generic drug:  budesonide-formoterol Inhale 2 puffs into the lungs 2 (two) times daily.   tiotropium 18 MCG inhalation capsule Commonly known as:  SPIRIVA Place 18 mcg into inhaler and inhale daily.   traZODone 100 MG tablet Commonly known as:  DESYREL Take by mouth at bedtime as needed.       Allergies:  Allergies  Allergen Reactions  . Sulfa Antibiotics Shortness Of Breath  . Amoxicillin Other (See Comments)    Body shakes  . Morphine Other (See Comments)    Hallucinations Hallucinations  . Penicillins Other (See Comments)    Body shakes  . Shellfish Allergy Swelling    Family History: Family History  Problem Relation Age of Onset  . Diabetes Unknown        2 Brother 2 Sister  . Hypertension Unknown   . Kidney disease Neg Hx   . Bladder Cancer Neg Hx   . Kidney cancer Neg Hx     Social History:  reports that she has been smoking Cigarettes.  She has never used smokeless tobacco. She reports that she does not drink alcohol or use drugs.  ROS: UROLOGY Frequent Urination?: No Hard to postpone urination?: No Burning/pain with urination?: No Get up at night to urinate?: No Leakage of urine?: Yes Urine stream starts and stops?: No Trouble starting stream?:  No Do you have to strain to urinate?: No Blood in urine?: No Urinary tract infection?: No Sexually transmitted disease?: No Injury to kidneys or bladder?: No Painful intercourse?: No Weak stream?: No Currently pregnant?: No Vaginal bleeding?: No Last menstrual period?: n  Gastrointestinal Nausea?: No Vomiting?: No Indigestion/heartburn?: No Diarrhea?: No Constipation?: No  Constitutional Fever: No Night sweats?: No Weight loss?: No Fatigue?: No  Skin  Skin rash/lesions?: No Itching?: No  Eyes Blurred vision?: No Double vision?: No  Ears/Nose/Throat Sore throat?: No Sinus problems?: No  Hematologic/Lymphatic Swollen glands?: No Easy bruising?: No  Cardiovascular Leg swelling?: No Chest pain?: No  Respiratory Cough?: No Shortness of breath?: No  Endocrine Excessive thirst?: No  Musculoskeletal Back pain?: No Joint pain?: No  Neurological Headaches?: No Dizziness?: No  Psychologic Depression?: No Anxiety?: No  Physical Exam: BP 114/66   Pulse 94   Ht 5\' 2"  (1.575 m)   Wt 237 lb 11.2 oz (107.8 kg)   BMI 43.48 kg/m   Constitutional:  Alert and oriented, No acute distress. HEENT: [place] AT, moist mucus membranes.  Trachea midline, no masses. Cardiovascular: No clubbing, cyanosis, or edema. Respiratory: Normal respiratory effort, no increased work of breathing. GI: Abdomen is soft, nontender, nondistended, no abdominal masses GU: No CVA tenderness.  Skin: No rashes, bruises or suspicious lesions. Lymph: No cervical or inguinal adenopathy. Neurologic: Grossly intact, no focal deficits, moving all 4 extremities. Psychiatric: Normal mood and affect.  Laboratory Data: Lab Results  Component Value Date   WBC 8.0 09/10/2016   HGB 11.2 (L) 09/10/2016   HCT 33.9 (L) 09/10/2016   MCV 89.3 09/10/2016   PLT 301 09/10/2016    Lab Results  Component Value Date   CREATININE 1.03 (H) 09/10/2016    No results found for: PSA  No results found for:  TESTOSTERONE  Lab Results  Component Value Date   HGBA1C 6.5 (H) 09/08/2012    Urinalysis    Component Value Date/Time   COLORURINE YELLOW (A) 09/10/2016 0939   APPEARANCEUR CLEAR (A) 09/10/2016 0939   APPEARANCEUR Hazy (A) 09/10/2015 1145   LABSPEC 1.025 09/10/2016 0939   LABSPEC 1.018 07/10/2014 2231   PHURINE 6.0 09/10/2016 0939   GLUCOSEU 50 (A) 09/10/2016 0939   GLUCOSEU Negative 07/10/2014 2231   HGBUR NEGATIVE 09/10/2016 0939   BILIRUBINUR NEGATIVE 09/10/2016 0939   BILIRUBINUR neg 06/24/2016 1428   BILIRUBINUR Negative 09/10/2015 1145   BILIRUBINUR Negative 07/10/2014 2231   KETONESUR NEGATIVE 09/10/2016 0939   PROTEINUR 30 (A) 09/10/2016 0939   UROBILINOGEN negative 06/24/2016 1428   NITRITE NEGATIVE 09/10/2016 0939   LEUKOCYTESUR NEGATIVE 09/10/2016 0939   LEUKOCYTESUR Negative 09/10/2015 1145   LEUKOCYTESUR Trace 07/10/2014 2231    Assessment & Plan:    1. Microscopic hematuria -Negative workup in 2017. Negative urinalysis today. Per American U[employer] Association guidelines, she'll follow-up in one year for second repeat urinalysis. If this is normal, she will follow up prn at that point.   Return in about 1 year (around 10/21/2017) for repeat urinalysis.  Hildred Laser, MD  [facility] [address], [address] [place], [place] [address] [phone]

## 2017-02-16 ENCOUNTER — Ambulatory Visit: Payer: Medicare Other | Admitting: Podiatry

## 2017-03-09 ENCOUNTER — Ambulatory Visit (INDEPENDENT_AMBULATORY_CARE_PROVIDER_SITE_OTHER): Payer: Medicare Other | Admitting: Podiatry

## 2017-03-09 ENCOUNTER — Encounter: Payer: Self-pay | Admitting: Podiatry

## 2017-03-09 DIAGNOSIS — B351 Tinea unguium: Secondary | ICD-10-CM | POA: Diagnosis not present

## 2017-03-09 DIAGNOSIS — M79609 Pain in unspecified limb: Secondary | ICD-10-CM | POA: Diagnosis not present

## 2017-03-09 DIAGNOSIS — E119 Type 2 diabetes mellitus without complications: Secondary | ICD-10-CM | POA: Diagnosis not present

## 2017-03-09 NOTE — Progress Notes (Signed)
Complaint:  Visit Type: Patient returns to my office for continued preventative foot care services. Complaint: Patient states" my nails have grown long and thick and become painful to walk and wear shoes" Patient has been diagnosed with DM with no foot complications. The patient presents for preventative foot care services. No changes to ROS.  Patient has not been seen for 11 months.  Podiatric Exam: Vascular: dorsalis pedis and posterior tibial pulses are palpable bilateral. Capillary return is immediate. Temperature gradient is WNL. Skin turgor WNL  Sensorium: Normal Semmes Weinstein monofilament test. Normal tactile sensation bilaterally. Nail Exam: Pt has thick disfigured discolored nails with subungual debris noted bilateral entire nail hallux through fifth toenails Ulcer Exam: There is no evidence of ulcer or pre-ulcerative changes or infection. Orthopedic Exam: Muscle tone and strength are WNL. No limitations in general ROM. No crepitus or effusions noted. Foot type and digits show no abnormalities. Bony prominences are unremarkable. Skin: No Porokeratosis. No infection or ulcers  Diagnosis:  Onychomycosis, , Pain in right toe, pain in left toes  Treatment & Plan Procedures and Treatment: Consent by patient was obtained for treatment procedures.   Debridement of mycotic and hypertrophic toenails, 1 through 5 bilateral and clearing of subungual debris. No ulceration, no infection noted.  Return Visit-Office Procedure: Patient instructed to return to the office for a follow up visit 3 months for continued evaluation and treatment.    Helane GuntherGregory Kimara Bencomo DPM

## 2017-03-27 DIAGNOSIS — R600 Localized edema: Secondary | ICD-10-CM | POA: Insufficient documentation

## 2017-04-06 ENCOUNTER — Ambulatory Visit (INDEPENDENT_AMBULATORY_CARE_PROVIDER_SITE_OTHER): Payer: Medicare Other | Admitting: Obstetrics and Gynecology

## 2017-04-06 ENCOUNTER — Encounter: Payer: Self-pay | Admitting: Obstetrics and Gynecology

## 2017-04-06 VITALS — BP 149/66 | HR 82 | Ht 62.0 in | Wt 251.3 lb

## 2017-04-06 DIAGNOSIS — Z4689 Encounter for fitting and adjustment of other specified devices: Secondary | ICD-10-CM | POA: Diagnosis not present

## 2017-04-06 DIAGNOSIS — N8110 Cystocele, unspecified: Secondary | ICD-10-CM | POA: Diagnosis not present

## 2017-04-06 DIAGNOSIS — N393 Stress incontinence (female) (male): Secondary | ICD-10-CM

## 2017-04-06 DIAGNOSIS — R300 Dysuria: Secondary | ICD-10-CM | POA: Diagnosis not present

## 2017-04-06 DIAGNOSIS — R110 Nausea: Secondary | ICD-10-CM | POA: Diagnosis not present

## 2017-04-06 LAB — POCT URINALYSIS DIPSTICK
Bilirubin, UA: NEGATIVE
GLUCOSE UA: NEGATIVE
Ketones, UA: NEGATIVE
LEUKOCYTES UA: NEGATIVE
NITRITE UA: NEGATIVE
ODOR: NEGATIVE
Spec Grav, UA: 1.02 (ref 1.010–1.025)
Urobilinogen, UA: 0.2 E.U./dL
pH, UA: 6 (ref 5.0–8.0)

## 2017-04-06 MED ORDER — ONDANSETRON 4 MG PO TBDP: 4.0000 mg | ORAL_TABLET | Freq: Four times a day (QID) | ORAL | 2 refills | Status: DC | PRN

## 2017-04-06 NOTE — Progress Notes (Signed)
    GYNECOLOGY PROGRESS NOTE  Subjective:    Patient ID: Teresa [name], female    DOB: 09-18-1960, 56 y.o.   MRN: [id number]  HPI  Patient is a 56 y.o. G0P0000 female who presents for complaints regarding her pessary.  Patient notes that for the past week she has noticed increase discomfort with the pessary, as well as increase in leaking urine and discomfort with urination.  Patient notes that she did have intercourse last week, which was "a little rough".  Denies vaginal itching, discharge, bleeding. Denies problems with moving bowels or other problems with urination.  Pessary was last checked 07/14/2016.  The following portions of the patient's history were reviewed and updated as appropriate: allergies, current medications, past family history, past medical history, past social history, past surgical history and problem list.  Review of Systems A comprehensive review of systems was negative except for: Gastrointestinal: positive for nausea  x 3 weeks.  Patient has a h/o of intermittent nausea for which she takes Zofran.  Requests refill for prescription as she is out of her medication.   Objective:   Blood pressure (!) 149/66, pulse 82, height 5\' 2"  (1.575 m), weight 251 lb 4.8 oz (114 kg). General appearance: alert and no distress Abdomen: soft, non-tender; bowel sounds normal; no masses,  no organomegaly Pelvic: ?The patient's Size 2 incontinence dish pessary was removed, cleaned and replaced without complications. The pessary was noted to be malpositioned. Speculum examination revealed normal vaginal mucosa with no lesions or lacerations.   Labs:  Results for orders placed or performed in visit on 04/06/17  POCT urinalysis dipstick  Result Value Ref Range   Color, UA yellow    Clarity, UA clear    Glucose, UA neg    Bilirubin, UA neg    Ketones, UA neg    Spec Grav, UA 1.020 1.010 - 1.025   Blood, UA non hem mod    pH, UA 6.0 5.0 - 8.0   Protein, UA trace    Urobilinogen, UA  0.2 0.2 or 1.0 E.U./dL   Nitrite, UA neg    Leukocytes, UA Negative Negative   Appearance yellow    Odor neg      Assessment:   Dysuria  Baden-Walker grade 2 cystocele Stress incontinence of urine Pessary maintenance Nausea  Plan:   1. Pessary removed, cleaned and reinserted today. Discussed that likely the malpositioning of her pessary was causing her symptoms.  Advised on more gentle intercourse, as well as compliance with future pessary checks.  Will schedule next check in 3 months.  2. UA negative today for infection, encouraged adequate hydration, cranberry juice for dysuria.  3. Given refill on nausea medication Zofran.    Hildred Laserherry, Kiyah Demartini, MD [facility]

## 2017-04-08 LAB — URINE CULTURE

## 2017-05-26 ENCOUNTER — Other Ambulatory Visit
Admission: RE | Admit: 2017-05-26 | Discharge: 2017-05-26 | Disposition: A | Discharge status: Home / Self Care | Payer: Medicare Other | Source: Other Acute Inpatient Hospital | Attending: Specialist | Admitting: Specialist

## 2017-05-26 DIAGNOSIS — R600 Localized edema: Secondary | ICD-10-CM | POA: Diagnosis present

## 2017-05-26 LAB — FIBRIN DERIVATIVES D-DIMER (ARMC ONLY): Fibrin derivatives D-dimer (ARMC): 638.42 ng/mL (FEU) — ABNORMAL HIGH (ref 0.00–499.00)

## 2017-05-29 ENCOUNTER — Ambulatory Visit
Admission: RE | Admit: 2017-05-29 | Discharge: 2017-05-29 | Disposition: A | Discharge status: Home / Self Care | Payer: Medicare Other | Source: Ambulatory Visit | Attending: Specialist | Admitting: Specialist

## 2017-05-29 ENCOUNTER — Other Ambulatory Visit: Payer: Self-pay | Admitting: Specialist

## 2017-05-29 DIAGNOSIS — R7989 Other specified abnormal findings of blood chemistry: Secondary | ICD-10-CM | POA: Diagnosis present

## 2017-05-29 DIAGNOSIS — M79604 Pain in right leg: Secondary | ICD-10-CM | POA: Insufficient documentation

## 2017-05-29 DIAGNOSIS — M79605 Pain in left leg: Principal | ICD-10-CM

## 2017-06-08 ENCOUNTER — Ambulatory Visit: Payer: Medicare Other | Admitting: Podiatry

## 2017-06-19 ENCOUNTER — Encounter: Payer: Self-pay | Admitting: Podiatry

## 2017-06-19 ENCOUNTER — Ambulatory Visit (INDEPENDENT_AMBULATORY_CARE_PROVIDER_SITE_OTHER): Payer: Medicare Other | Admitting: Podiatry

## 2017-06-19 DIAGNOSIS — B351 Tinea unguium: Secondary | ICD-10-CM | POA: Diagnosis not present

## 2017-06-19 DIAGNOSIS — M79609 Pain in unspecified limb: Secondary | ICD-10-CM

## 2017-06-19 DIAGNOSIS — E119 Type 2 diabetes mellitus without complications: Secondary | ICD-10-CM

## 2017-06-19 NOTE — Progress Notes (Signed)
Complaint:  Visit Type: Patient returns to my office for continued preventative foot care services. Complaint: Patient states" my nails have grown long and thick and become painful to walk and wear shoes" Patient has been diagnosed with DM with no foot complications. The patient presents for preventative foot care services. No changes to ROS.  Patient has not been seen for 11 months.  Podiatric Exam: Vascular: dorsalis pedis and posterior tibial pulses are palpable bilateral. Capillary return is immediate. Temperature gradient is WNL. Skin turgor WNL  Sensorium: Normal Semmes Weinstein monofilament test. Normal tactile sensation bilaterally. Nail Exam: Pt has thick disfigured discolored nails with subungual debris noted bilateral entire nail hallux through fifth toenails Ulcer Exam: There is no evidence of ulcer or pre-ulcerative changes or infection. Orthopedic Exam: Muscle tone and strength are WNL. No limitations in general ROM. No crepitus or effusions noted. Foot type and digits show no abnormalities. Bony prominences are unremarkable. Skin: No Porokeratosis. No infection or ulcers  Diagnosis:  Onychomycosis, , Pain in right toe, pain in left toes  Treatment & Plan Procedures and Treatment: Consent by patient was obtained for treatment procedures.   Debridement of mycotic and hypertrophic toenails, 1 through 5 bilateral and clearing of subungual debris. No ulceration, no infection noted.  Return Visit-Office Procedure: Patient instructed to return to the office for a follow up visit 3 months for continued evaluation and treatment.    Solimar Maiden DPM 

## 2017-07-18 DIAGNOSIS — A6004 Herpesviral vulvovaginitis: Secondary | ICD-10-CM | POA: Insufficient documentation

## 2017-08-29 ENCOUNTER — Ambulatory Visit
Admission: RE | Admit: 2017-08-29 | Discharge: 2017-08-29 | Disposition: A | Discharge status: Home / Self Care | Payer: Medicare Other | Source: Ambulatory Visit | Attending: Internal Medicine | Admitting: Internal Medicine

## 2017-08-29 DIAGNOSIS — Z1231 Encounter for screening mammogram for malignant neoplasm of breast: Secondary | ICD-10-CM | POA: Insufficient documentation

## 2017-08-29 IMAGING — MG MM DIGITAL SCREENING BILAT W/ TOMO W/ CAD
6 of 10 series · 6 of 30 positions shown · non-contrast
Comparison: Previous exam(s).

CLINICAL DATA: Screening.

EXAM:
DIGITAL SCREENING BILATERAL MAMMOGRAM WITH TOMO AND CAD

[L MLO synth-2D]
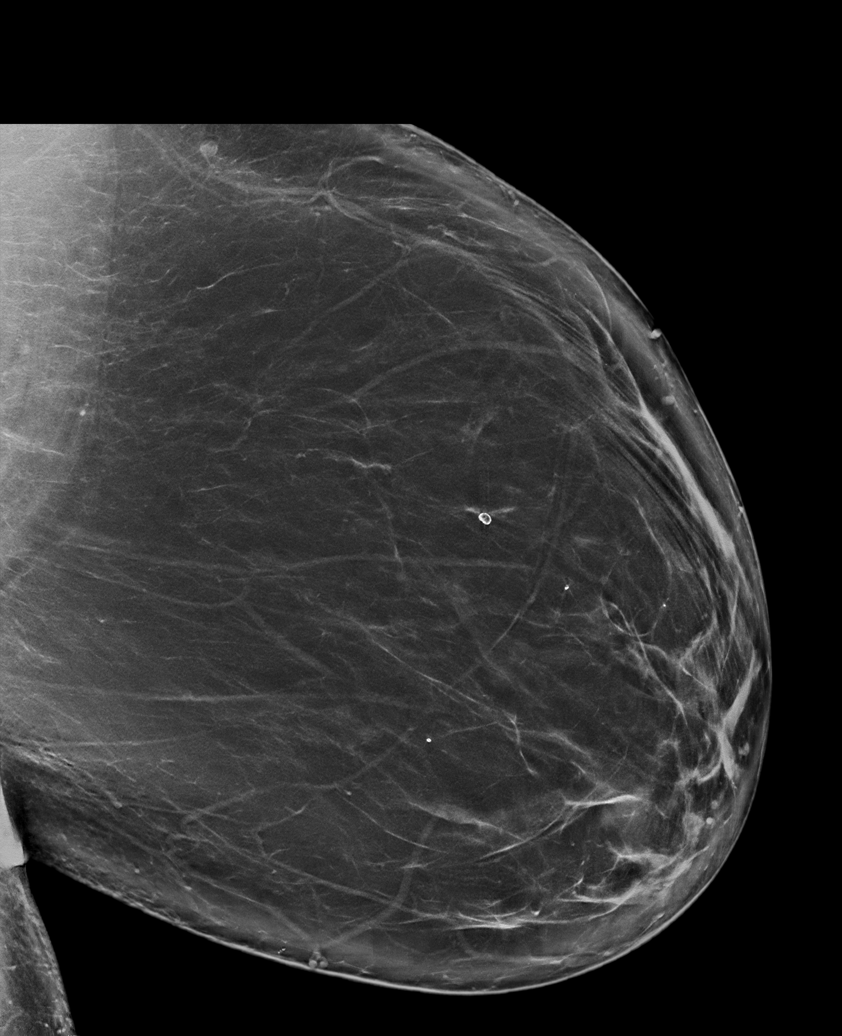

[R CC synth-2D]
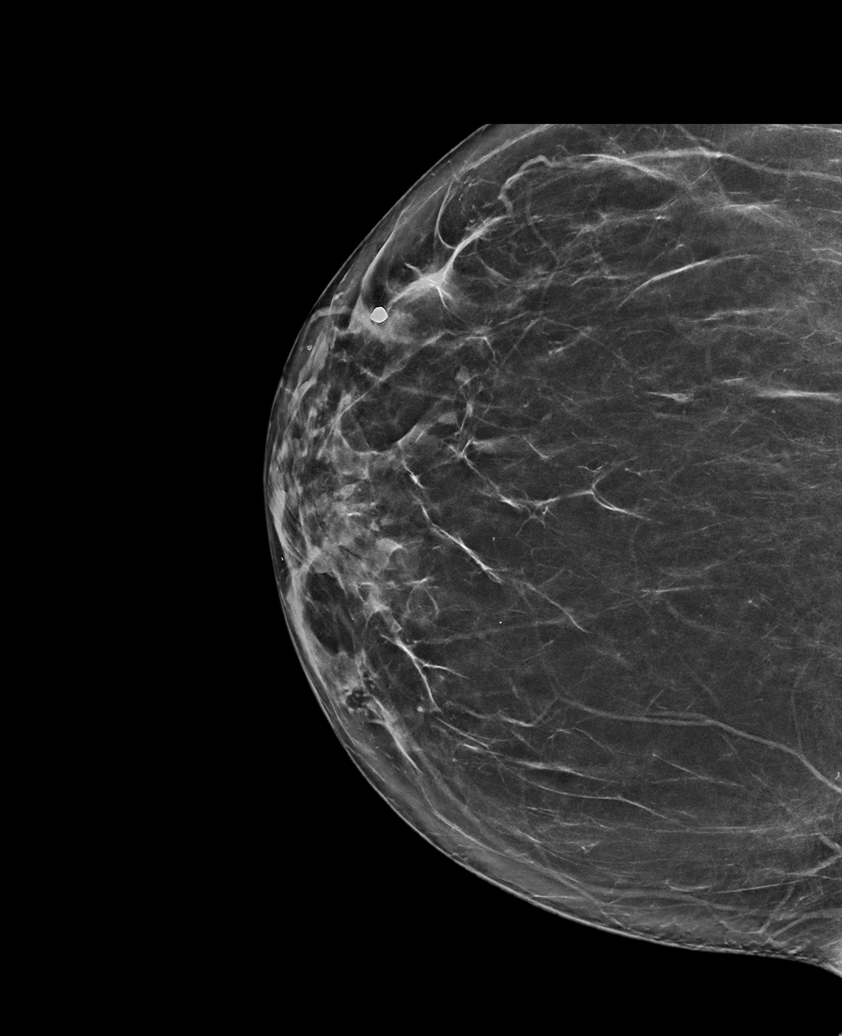

[R MLO synth-2D]
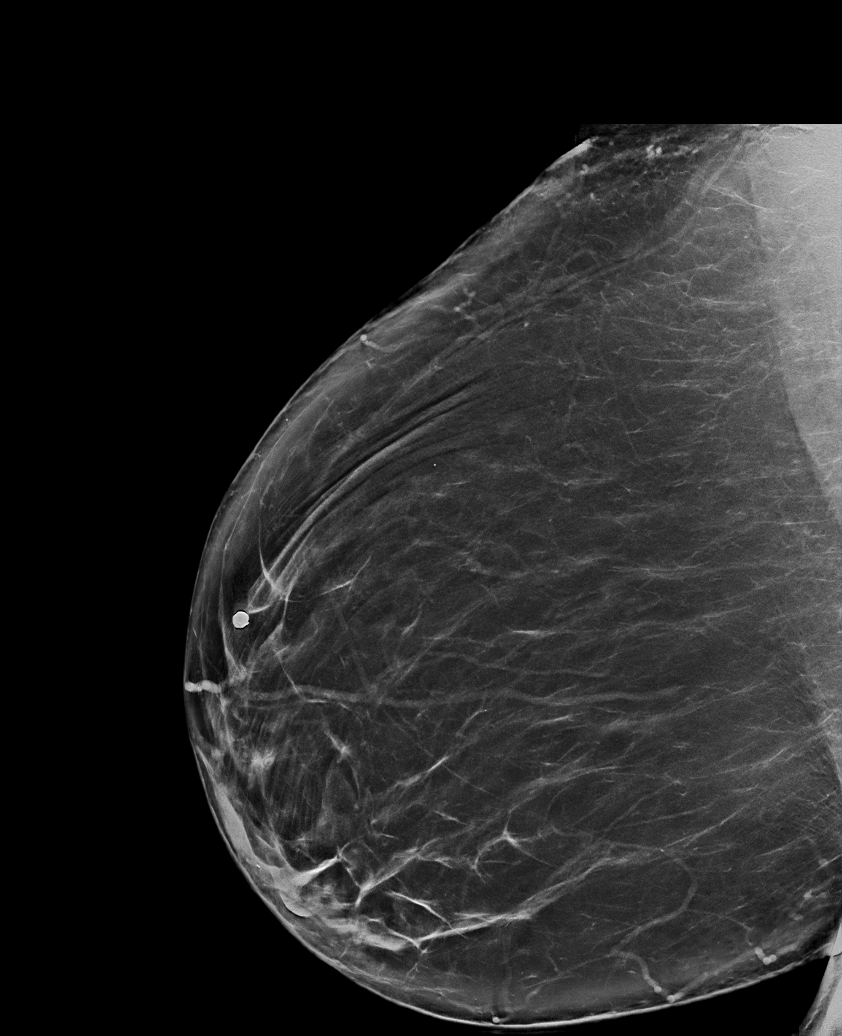

[L XCCL synth-2D]
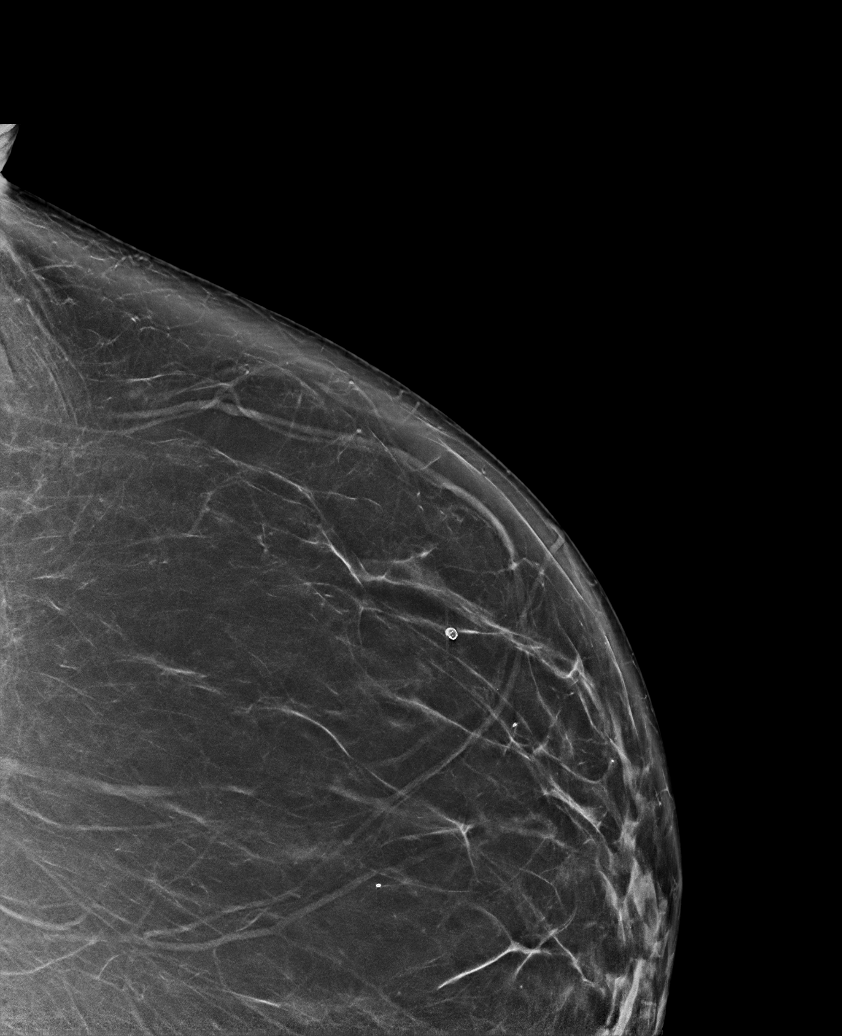

[L CC synth-2D]
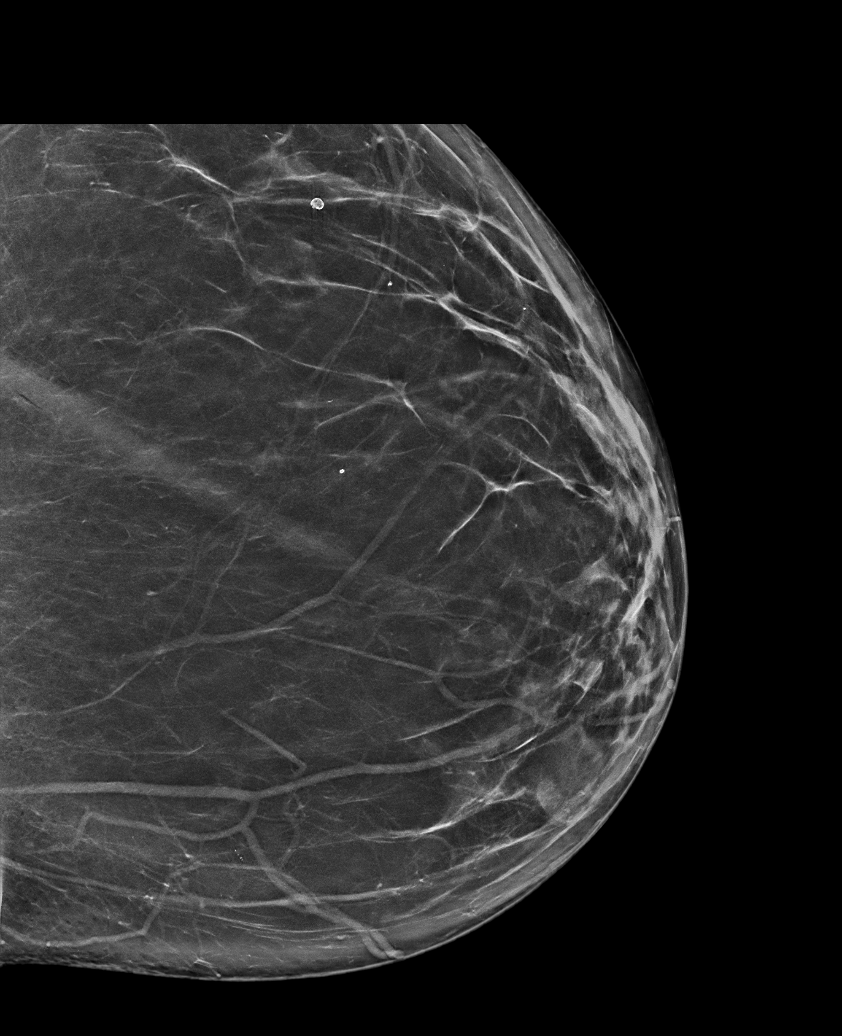

[L XCCL tomo · tomo slice 46/91.0]
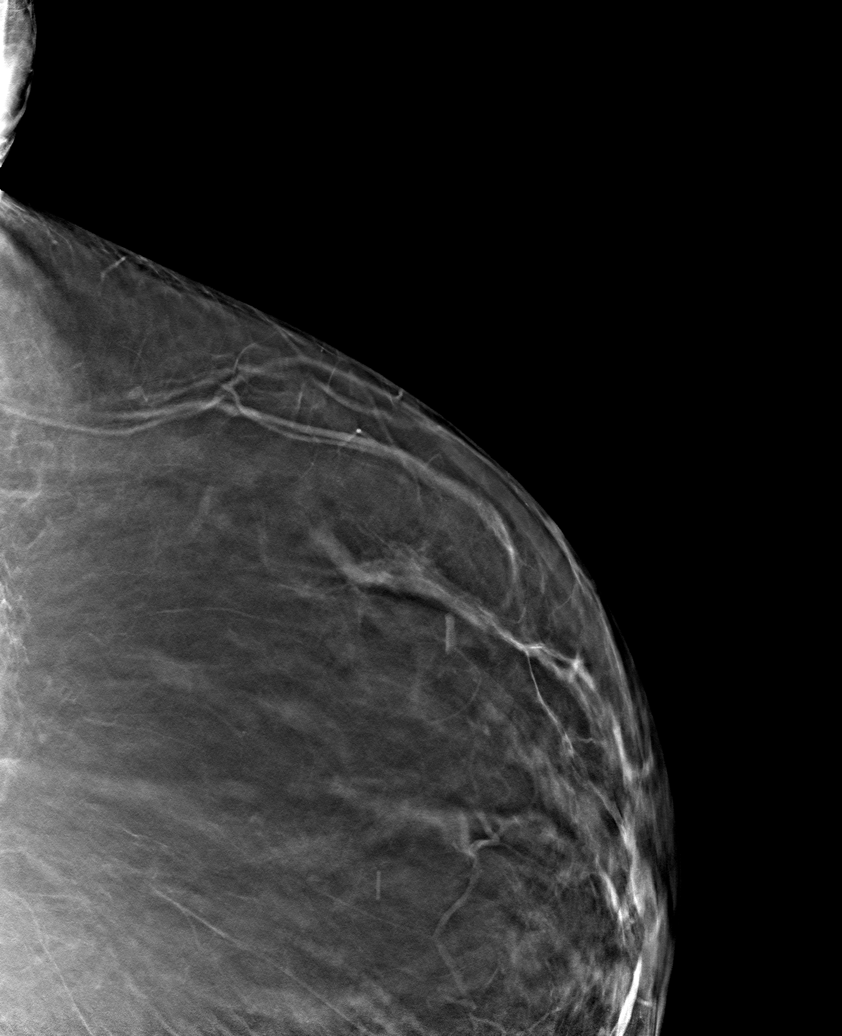

[6 of 30 positions shown; findings below may reference images not displayed]

ACR Breast Density Category b: There are scattered areas of
fibroglandular density.
FINDINGS: There are no findings suspicious for malignancy. Images were
processed with CAD.
IMPRESSION: No mammographic evidence of malignancy. A result letter of this
screening mammogram will be mailed directly to the patient.

RECOMMENDATION:
Screening mammogram in one year. (Code:[TQ])

BI-RADS CATEGORY  1: Negative.

## 2017-09-21 ENCOUNTER — Ambulatory Visit: Payer: Medicare Other | Admitting: Podiatry

## 2017-10-26 ENCOUNTER — Ambulatory Visit: Payer: Medicare Other

## 2017-11-16 ENCOUNTER — Encounter: Payer: Self-pay | Admitting: Urology

## 2017-11-16 ENCOUNTER — Ambulatory Visit (INDEPENDENT_AMBULATORY_CARE_PROVIDER_SITE_OTHER): Payer: Medicare Other | Admitting: Urology

## 2017-11-16 VITALS — BP 171/73 | HR 84 | Ht 62.0 in

## 2017-11-16 DIAGNOSIS — R3129 Other microscopic hematuria: Secondary | ICD-10-CM | POA: Diagnosis not present

## 2017-11-16 DIAGNOSIS — N3946 Mixed incontinence: Secondary | ICD-10-CM | POA: Diagnosis not present

## 2017-11-16 LAB — URINALYSIS, COMPLETE
Bilirubin, UA: NEGATIVE
Ketones, UA: NEGATIVE
LEUKOCYTES UA: NEGATIVE
Nitrite, UA: NEGATIVE
PH UA: 5.5 (ref 5.0–7.5)
Specific Gravity, UA: 1.02 (ref 1.005–1.030)
Urobilinogen, Ur: 0.2 mg/dL (ref 0.2–1.0)

## 2017-11-16 LAB — MICROSCOPIC EXAMINATION: WBC, UA: NONE SEEN /hpf (ref 0–5)

## 2017-11-16 NOTE — Progress Notes (Signed)
11/16/2017 1:18 PM   [name] 05-07-1960 [id number]  Referring provider: Jetty Duhamel, MD [address] [place], [place] [address]  Chief Complaint  Patient presents with  . Hematuria    HPI:  57 year old female follow-up for microscopic hematuria-she underwent hematuria evaluation in 2017 with a normal cystoscopy and CT scan.  A follow-up CT scan June 2018 was also normal.  I reviewed the images.  Microscopic hematuria was not present in 2018 and urinalysis today shows 0-2 red cells.  She did have moderate bacteria but negative nitrite and leukocyte esterase.  She has no dysuria.  No flank pain. She leaks with mixed incontinence. Lasix makes that worse. She voids with a good flow.   PMH: Past Medical History:  Diagnosis Date  . Anxiety disorder   . Arthritis   . Asthma   . Bilateral leg edema   . CAD (coronary artery disease)   . Chest pain   . Chronic back pain   . Chronic kidney disease   . COPD (chronic obstructive pulmonary disease) (HCC)   . DDD (degenerative disc disease), lumbar   . Depression   . Diabetes (HCC)   . Diabetic retinopathy (HCC)   . Genital herpes   . Heart burn   . Heart disease   . Heart murmur   . Hepatitis   . HLD (hyperlipidemia)   . Hypertension   . Impetigo   . Microscopic hematuria   . Obesity, morbid (HCC)   . Pancreatitis   . Peripheral neuropathy   . Sleep apnea   . Thyroid disease   . Urinary frequency   . Urinary incontinence   . Ventricular septal defect     Surgical History: Past Surgical History:  Procedure Laterality Date  . CARDIAC SURGERY    . CYST EXCISION    . LUMBAR DISC SURGERY    . WRIST SURGERY Bilateral     Home Medications:  Allergies as of 11/16/2017      Reactions   Other Swelling   Penicillins Other (See Comments), Hives, Rash, Shortness Of Breath   Body shakes Body shakes nervousness   Sulfa Antibiotics Shortness Of Breath   Amoxicillin Other (See Comments)   Body  shakes   Morphine Other (See Comments)   Hallucinations Hallucinations      Medication List        Accurate as of 11/16/17  1:18 PM. Always use your most recent med list.          acyclovir 400 MG tablet Commonly known as:  ZOVIRAX Take 400 mg by mouth 2 (two) times daily.   albuterol 108 (90 Base) MCG/ACT inhaler Commonly known as:  PROVENTIL HFA;VENTOLIN HFA Inhale 1-2 puffs into the lungs every 6 (six) hours as needed for wheezing or shortness of breath.   ALPRAZolam 1 MG tablet Commonly known as:  XANAX Take 1 mg by mouth 3 (three) times daily as needed for anxiety. 3 to 4 times daily PRN   amLODipine 5 MG tablet Commonly known as:  NORVASC Take 5 mg by mouth daily.   aspirin 81 MG tablet Take 1 tablet (81 mg total) by mouth daily.   atorvastatin 20 MG tablet Commonly known as:  LIPITOR Take 20 mg by mouth at bedtime.   enalapril 5 MG tablet Commonly known as:  VASOTEC Take 5 mg by mouth daily.   FLUoxetine HCl 60 MG Tabs Take 60 mg by mouth daily.   fluticasone 50 MCG/ACT  nasal spray Commonly known as:  FLONASE Place 1 spray into both nostrils daily as needed.   furosemide 40 MG tablet Commonly known as:  LASIX Take 40 mg by mouth daily.   gabapentin 300 MG capsule Commonly known as:  NEURONTIN Take 300 mg by mouth 3 (three) times daily.   insulin lispro 100 UNIT/ML injection Commonly known as:  HUMALOG Inject 4-10 Units into the skin 3 (three) times daily with meals. Sliding scale   LANTUS 100 UNIT/ML injection Generic drug:  insulin glargine Inject 80 Units into the skin at bedtime.   metoprolol tartrate 25 MG tablet Commonly known as:  LOPRESSOR Take 25 mg by mouth 2 (two) times daily. Reported on 08/06/2015   naproxen 500 MG EC tablet Commonly known as:  EC NAPROSYN Take 1 tablet (500 mg total) by mouth 2 (two) times daily with a meal.   NICODERM CQ 7 mg/24hr patch Generic drug:  nicotine Place 7 mg onto the skin daily.   ondansetron 4  MG disintegrating tablet Commonly known as:  ZOFRAN-ODT Take 1 tablet (4 mg total) by mouth every 6 (six) hours as needed for nausea.   oxyCODONE 5 MG immediate release tablet Commonly known as:  Oxy IR/ROXICODONE take 1 tablet by mouth every 6 hours if needed for pain   oxyCODONE-acetaminophen 5-325 MG tablet Commonly known as:  PERCOCET/ROXICET Take 1 tablet by mouth every 6 (six) hours as needed for severe pain.   OXYGEN Inhale 2 Units into the lungs.   polyethylene glycol powder powder Commonly known as:  GLYCOLAX/MIRALAX Take 17 g by mouth daily as needed.   SYMBICORT 160-4.5 MCG/ACT inhaler Generic drug:  budesonide-formoterol Inhale 2 puffs into the lungs 2 (two) times daily.   tiotropium 18 MCG inhalation capsule Commonly known as:  SPIRIVA Place 18 mcg into inhaler and inhale daily.   traZODone 100 MG tablet Commonly known as:  DESYREL Take by mouth at bedtime as needed.       Allergies:  Allergies  Allergen Reactions  . Other Swelling  . Penicillins Other (See Comments), Hives, Rash and Shortness Of Breath    Body shakes Body shakes nervousness   . Sulfa Antibiotics Shortness Of Breath  . Amoxicillin Other (See Comments)    Body shakes  . Morphine Other (See Comments)    Hallucinations Hallucinations    Family History: Family History  Problem Relation Age of Onset  . Diabetes Unknown        2 Brother 2 Sister  . Hypertension Unknown   . Kidney disease Neg Hx   . Bladder Cancer Neg Hx   . Kidney cancer Neg Hx     Social History:  reports that she has been smoking cigarettes. She has never used smokeless tobacco. She reports that she does not drink alcohol or use drugs.  ROS: UROLOGY Frequent Urination?: No Hard to postpone urination?: No Burning/pain with urination?: No Get up at night to urinate?: No Leakage of urine?: Yes Urine stream starts and stops?: No Trouble starting stream?: No Do you have to strain to urinate?: No Blood in  urine?: No Urinary tract infection?: Yes Sexually transmitted disease?: No Injury to kidneys or bladder?: No Painful intercourse?: No Weak stream?: No Currently pregnant?: No Vaginal bleeding?: No Last menstrual period?: n  Gastrointestinal Nausea?: No Vomiting?: No Indigestion/heartburn?: Yes Diarrhea?: No Constipation?: No  Constitutional Fever: No Night sweats?: No Weight loss?: No Fatigue?: Yes  Skin Skin rash/lesions?: No Itching?: No  Eyes Blurred vision?: Yes Double  vision?: No  Ears/Nose/Throat Sore throat?: No Sinus problems?: Yes  Hematologic/Lymphatic Swollen glands?: No Easy bruising?: No  Cardiovascular Leg swelling?: Yes Chest pain?: No  Respiratory Cough?: Yes Shortness of breath?: No  Endocrine Excessive thirst?: Yes  Musculoskeletal Back pain?: Yes Joint pain?: No  Neurological Headaches?: Yes Dizziness?: No  Psychologic Depression?: No Anxiety?: Yes  Physical Exam: BP (!) 171/73 (BP Location: Left Arm, Patient Position: Sitting, Cuff Size: Large)   Pulse 84   Ht 5\' 2"  (1.575 m)   BMI 45.96 kg/m   Constitutional:  Alert and oriented, No acute distress. In a wheelchair -- tired today HEENT: [place] AT, moist mucus membranes.  Trachea midline, no masses. Cardiovascular: No clubbing, cyanosis, or edema. Respiratory: Normal respiratory effort, no increased work of breathing. GI: Abdomen is soft, nontender, nondistended, no abdominal masses GU: No CVA tenderness Skin: No rashes, bruises or suspicious lesions. Neurologic: Grossly intact, no focal deficits, moving all 4 extremities. Psychiatric: Normal mood and affect.  Laboratory Data: Lab Results  Component Value Date   WBC 8.0 09/10/2016   HGB 11.2 (L) 09/10/2016   HCT 33.9 (L) 09/10/2016   MCV 89.3 09/10/2016   PLT 301 09/10/2016    Lab Results  Component Value Date   CREATININE 1.03 (H) 09/10/2016    No results found for: PSA  No results found for:  TESTOSTERONE  Lab Results  Component Value Date   HGBA1C 6.5 (H) 09/08/2012    Urinalysis    Component Value Date/Time   COLORURINE YELLOW (A) 09/10/2016 0939   APPEARANCEUR Clear 10/21/2016 1330   LABSPEC 1.025 09/10/2016 0939   LABSPEC 1.018 07/10/2014 2231   PHURINE 6.0 09/10/2016 0939   GLUCOSEU Negative 10/21/2016 1330   GLUCOSEU Negative 07/10/2014 2231   HGBUR NEGATIVE 09/10/2016 0939   BILIRUBINUR neg 04/06/2017 0927   BILIRUBINUR Negative 10/21/2016 1330   BILIRUBINUR Negative 07/10/2014 2231   KETONESUR NEGATIVE 09/10/2016 0939   PROTEINUR trace 04/06/2017 0927   PROTEINUR Negative 10/21/2016 1330   PROTEINUR 30 (A) 09/10/2016 0939   UROBILINOGEN 0.2 04/06/2017 0927   NITRITE neg 04/06/2017 0927   NITRITE Negative 10/21/2016 1330   NITRITE NEGATIVE 09/10/2016 0939   LEUKOCYTESUR Negative 04/06/2017 0927   LEUKOCYTESUR Negative 10/21/2016 1330   LEUKOCYTESUR Trace 07/10/2014 2231    Lab Results  Component Value Date   LABMICR See below: 10/21/2016   WBCUA None seen 10/21/2016   RBCUA None seen 10/21/2016   LABEPIT 0-10 10/21/2016   MUCUS Present (A) 10/21/2016   BACTERIA None seen 10/21/2016    Pertinent Imaging: CT scan in 2017 and 2018 No results found for this or any previous visit. Results for orders placed during the hospital encounter of 05/29/17  US Venous Img Lower Bilateral   Narrative CLINICAL DATA:  Bilateral lower extremity pain  EXAM: BILATERAL LOWER EXTREMITY VENOUS DOPPLER ULTRASOUND  TECHNIQUE: Gray-scale sonography with graded compression, as well as color Doppler and duplex ultrasound were performed to evaluate the lower extremity deep venous systems from the level of the common femoral vein and including the common femoral, femoral, profunda femoral, popliteal and calf veins including the posterior tibial, peroneal and gastrocnemius veins when visible. The superficial great saphenous vein was also interrogated. Spectral Doppler  was utilized to evaluate flow at rest and with distal augmentation maneuvers in the common femoral, femoral and popliteal veins.  COMPARISON:  11/04/2015  FINDINGS: RIGHT LOWER EXTREMITY  Common Femoral Vein: No evidence of thrombus. Normal compressibility, respiratory phasicity and response to augmentation.  Saphenofemoral Junction: No evidence of thrombus. Normal compressibility and flow on color Doppler imaging.  Profunda Femoral Vein: No evidence of thrombus. Normal compressibility and flow on color Doppler imaging.  Femoral Vein: No evidence of thrombus. Normal compressibility, respiratory phasicity and response to augmentation.  Popliteal Vein: No evidence of thrombus. Normal compressibility, respiratory phasicity and response to augmentation.  Calf Veins: No evidence of thrombus. Normal compressibility and flow on color Doppler imaging.  Superficial Great Saphenous Vein: No evidence of thrombus. Normal compressibility.  Venous Reflux:  None.  Other Findings:  Calf edema is noted.  LEFT LOWER EXTREMITY  Common Femoral Vein: No evidence of thrombus. Normal compressibility, respiratory phasicity and response to augmentation.  Saphenofemoral Junction: No evidence of thrombus. Normal compressibility and flow on color Doppler imaging.  Profunda Femoral Vein: No evidence of thrombus. Normal compressibility and flow on color Doppler imaging.  Femoral Vein: No evidence of thrombus. Normal compressibility, respiratory phasicity and response to augmentation.  Popliteal Vein: No evidence of thrombus. Normal compressibility, respiratory phasicity and response to augmentation.  Calf Veins: No evidence of thrombus. Normal compressibility and flow on color Doppler imaging.  Superficial Great Saphenous Vein: No evidence of thrombus. Normal compressibility.  Venous Reflux:  None.  Other Findings:  Calf edema is noted.  IMPRESSION: No evidence of deep venous  thrombosis. The peroneal veins are not well visualized bilaterally.   Electronically Signed   By: Alcide Clever M.D.   On: 05/29/2017 14:39    No results found for this or any previous visit. No results found for this or any previous visit. No results found for this or any previous visit. No results found for this or any previous visit. No results found for this or any previous visit. No results found for this or any previous visit.  Assessment & Plan:    1. Microscopic hematuria Resolved  - Urinalysis, Complete 2. Mixed incontinence   No follow-ups on file.  Jerilee Field, MD  [facility] [address], [address] [place], [place] [address] [phone]

## 2017-11-27 ENCOUNTER — Telehealth: Payer: Self-pay | Admitting: Obstetrics and Gynecology

## 2017-11-27 NOTE — Telephone Encounter (Signed)
The patient called and stated that she will need a call back from the nurse in regards to her needing another prescription that she has written for her monthly. The patient also has a few questions. Please advise.

## 2017-11-28 NOTE — Telephone Encounter (Signed)
Mailbox full. Unable to a message.

## 2017-11-28 NOTE — Telephone Encounter (Signed)
Pt aware she needs an appt for refills. Appt made for 12/19/2017.

## 2017-12-18 ENCOUNTER — Ambulatory Visit: Payer: Medicare Other | Admitting: Podiatry

## 2017-12-19 ENCOUNTER — Encounter: Payer: Medicare Other | Admitting: Obstetrics and Gynecology

## 2017-12-26 ENCOUNTER — Encounter: Payer: Self-pay | Admitting: Obstetrics and Gynecology

## 2017-12-26 ENCOUNTER — Ambulatory Visit: Payer: Medicare Other | Admitting: Obstetrics and Gynecology

## 2017-12-26 VITALS — BP 134/55 | HR 93 | Ht 62.0 in | Wt 253.6 lb

## 2017-12-26 DIAGNOSIS — B3731 Acute candidiasis of vulva and vagina: Secondary | ICD-10-CM

## 2017-12-26 DIAGNOSIS — Z4689 Encounter for fitting and adjustment of other specified devices: Secondary | ICD-10-CM | POA: Diagnosis not present

## 2017-12-26 DIAGNOSIS — N8111 Cystocele, midline: Secondary | ICD-10-CM

## 2017-12-26 DIAGNOSIS — N393 Stress incontinence (female) (male): Secondary | ICD-10-CM | POA: Diagnosis not present

## 2017-12-26 DIAGNOSIS — B373 Candidiasis of vulva and vagina: Secondary | ICD-10-CM

## 2017-12-26 MED ORDER — OXYQUINOLONE SULFATE 0.025 % VA GEL: 0.0250 | VAGINAL | 3 refills | Status: AC

## 2017-12-26 NOTE — Progress Notes (Signed)
Chief complaint: 1.  Pessary maintenance 2.  Midline cystocele 3.  Stress incontinence 4.  Morbid obesity 5.  Diabetes mellitus 6.  Chronic monilia infections  Teresa [name] presents today for pessary maintenance (last visit 07/14/2016). She has been having a problem with chronic yeast infections likely secondary to her uncontrolled diabetes mellitus and morbid obesity.  She has been getting chronic infections under her breasts, under her abdominal panniculus, as well as within the groin.  She has been treating these infections with nystatin powder topically on a daily basis. Overall she has been minimally symptomatic recently with irritation in the groin bilaterally, as monilia involvement in the panniculus region and breast region has been minimal.  Valve reports reasonable bladder function with complete bladder emptying and some improved continence.  Bowel function is reportedly normal.  She does not report any significant vaginal discharge, vaginal bleeding, or vaginal odor.   Past Medical History:  Diagnosis Date  . Anxiety disorder   . Arthritis   . Asthma   . Bilateral leg edema   . CAD (coronary artery disease)   . Chest pain   . Chronic back pain   . Chronic kidney disease   . COPD (chronic obstructive pulmonary disease) (HCC)   . DDD (degenerative disc disease), lumbar   . Depression   . Diabetes (HCC)   . Diabetic retinopathy (HCC)   . Genital herpes   . Heart burn   . Heart disease   . Heart murmur   . Hepatitis   . HLD (hyperlipidemia)   . Hypertension   . Impetigo   . Microscopic hematuria   . Obesity, morbid (HCC)   . Pancreatitis   . Peripheral neuropathy   . Sleep apnea   . Thyroid disease   . Urinary frequency   . Urinary incontinence   . Ventricular septal defect    Past Surgical History:  Procedure Laterality Date  . CARDIAC SURGERY    . CYST EXCISION    . LUMBAR DISC SURGERY    . WRIST SURGERY Bilateral    OBJECTIVE: BP (!) 134/55   Pulse 93   Ht  5\' 2"  (1.575 m)   Wt 253 lb 9.6 oz (115 kg)   BMI 46.38 kg/m  Pleasant female in no acute distress.  Alert and oriented.  Affect is appropriate. Abdomen: Soft, nontender; large panniculus overlying lower abdomen; no significant active inflammation, rash, or ulceration is noted Pelvic exam: External genitalia-normal; brawny skin changes noted in the groin region without significant rash or skin breakdown; epithelium slightly hyperplastic; more inflammation noted on left than right BUS-normal Vagina-fair estrogen effect; no significant vaginal discharge; mucosa healthy without evidence of ulceration or erythema Cervix-no cervical motion tenderness; no lesions Uterus-normal size, nontender, mobile Bimanual-no palpable masses or tenderness Rectovaginal-normal external exam  ASSESSMENT: 1.  Second-degree cystocele, managed with pessary 2.  Stress urinary incontinence, improved with pessary use 3.  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  Incontinence dish is removed, cleaned, and reinserted today 2.  Recommend using Trimosan gel intravaginal weekly 3.  Continue with nystatin powder topically to the groin/vulva, sub-panniculus region, and inframammary region on a daily basis 4.  Return in 2 months for follow-up pessary maintenance  A total of 15 minutes were spent face-to-face with the patient during this encounter and over half of that time dealt with counseling and coordination of care.  Herold HarmsMartin A Rashawna Scoles, MD  Note: This dictation was prepared  with Dragon dictation along with smaller phrase technology. Any transcriptional errors that result from this process are unintentional.

## 2017-12-26 NOTE — Patient Instructions (Addendum)
1.  Recommend Trimosan gel intravaginal weekly to prevent vaginitis 2.  Return in 2 months for follow-up pessary maintenance 3.  Continue using nystatin powder topically to the groin, under the panniculus of the abdomen, and under the breasts daily

## 2017-12-28 ENCOUNTER — Ambulatory Visit: Payer: Medicare Other | Admitting: Podiatry

## 2018-01-08 ENCOUNTER — Ambulatory Visit (INDEPENDENT_AMBULATORY_CARE_PROVIDER_SITE_OTHER): Payer: Medicare Other | Admitting: Podiatry

## 2018-01-08 ENCOUNTER — Encounter: Payer: Self-pay | Admitting: Podiatry

## 2018-01-08 DIAGNOSIS — B351 Tinea unguium: Secondary | ICD-10-CM

## 2018-01-08 DIAGNOSIS — E119 Type 2 diabetes mellitus without complications: Secondary | ICD-10-CM | POA: Diagnosis not present

## 2018-01-08 DIAGNOSIS — M79609 Pain in unspecified limb: Secondary | ICD-10-CM | POA: Diagnosis not present

## 2018-01-08 NOTE — Progress Notes (Signed)
Complaint:  Visit Type: Patient returns to my office for continued preventative foot care services. Complaint: Patient states" my nails have grown long and thick and become painful to walk and wear shoes" Patient has been diagnosed with DM with no foot complications. The patient presents for preventative foot care services. No changes to ROS.  Patient has not been seen for 11 months.  Podiatric Exam: Vascular: dorsalis pedis and posterior tibial pulses are palpable bilateral. Capillary return is immediate. Temperature gradient is WNL. Skin turgor WNL  Sensorium: Normal Semmes Weinstein monofilament test. Normal tactile sensation bilaterally. Nail Exam: Pt has thick disfigured discolored nails with subungual debris noted bilateral entire nail hallux through fifth toenails Ulcer Exam: There is no evidence of ulcer or pre-ulcerative changes or infection. Orthopedic Exam: Muscle tone and strength are WNL. No limitations in general ROM. No crepitus or effusions noted. Foot type and digits show no abnormalities. Bony prominences are unremarkable. Skin: No Porokeratosis. No infection or ulcers  Diagnosis:  Onychomycosis, , Pain in right toe, pain in left toes  Treatment & Plan Procedures and Treatment: Consent by patient was obtained for treatment procedures.   Debridement of mycotic and hypertrophic toenails, 1 through 5 bilateral and clearing of subungual debris. No ulceration, no infection noted.  Return Visit-Office Procedure: Patient instructed to return to the office for a follow up visit 3 months for continued evaluation and treatment.    Val Schiavo DPM 

## 2018-02-20 ENCOUNTER — Encounter: Payer: Medicare Other | Admitting: Obstetrics and Gynecology

## 2018-02-28 ENCOUNTER — Encounter: Payer: Medicare Other | Admitting: Obstetrics and Gynecology

## 2018-04-09 ENCOUNTER — Ambulatory Visit: Payer: Medicare Other | Admitting: Podiatry

## 2018-04-23 ENCOUNTER — Other Ambulatory Visit: Payer: Self-pay | Admitting: Internal Medicine

## 2018-04-23 DIAGNOSIS — I25118 Atherosclerotic heart disease of native coronary artery with other forms of angina pectoris: Secondary | ICD-10-CM

## 2018-04-23 DIAGNOSIS — R0602 Shortness of breath: Secondary | ICD-10-CM

## 2018-05-07 ENCOUNTER — Encounter
Admission: RE | Admit: 2018-05-07 | Discharge: 2018-05-07 | Disposition: A | Discharge status: Home / Self Care | Payer: Medicare Other | Source: Ambulatory Visit | Attending: Internal Medicine | Admitting: Internal Medicine

## 2018-05-07 ENCOUNTER — Ambulatory Visit
Admission: RE | Admit: 2018-05-07 | Discharge: 2018-05-07 | Disposition: A | Discharge status: Home / Self Care | Payer: Medicare Other | Source: Ambulatory Visit | Attending: Internal Medicine | Admitting: Internal Medicine

## 2018-05-07 DIAGNOSIS — R0602 Shortness of breath: Secondary | ICD-10-CM

## 2018-05-07 DIAGNOSIS — R011 Cardiac murmur, unspecified: Secondary | ICD-10-CM | POA: Diagnosis not present

## 2018-05-07 DIAGNOSIS — I119 Hypertensive heart disease without heart failure: Secondary | ICD-10-CM | POA: Insufficient documentation

## 2018-05-07 DIAGNOSIS — I25118 Atherosclerotic heart disease of native coronary artery with other forms of angina pectoris: Secondary | ICD-10-CM

## 2018-05-07 DIAGNOSIS — J449 Chronic obstructive pulmonary disease, unspecified: Secondary | ICD-10-CM | POA: Diagnosis not present

## 2018-05-07 LAB — NM MYOCAR MULTI W/SPECT W/WALL MOTION / EF
CSEPEDS: 1 s
CSEPHR: 65 %
Estimated workload: 1 METS
Exercise duration (min): 1 min
LVDIAVOL: 152 mL (ref 46–106)
LVSYSVOL: 67 mL
MPHR: 163 {beats}/min
Peak HR: 107 {beats}/min
Rest HR: 76 {beats}/min
SDS: 0
SRS: 4
SSS: 4
TID: 0.95

## 2018-05-07 MED ORDER — TECHNETIUM TC 99M TETROFOSMIN IV KIT
10.8200 | PACK | Freq: Once | INTRAVENOUS | Status: AC | PRN
  Administered 2018-05-07: 10.82 via INTRAVENOUS

## 2018-05-07 MED ORDER — TECHNETIUM TC 99M TETROFOSMIN IV KIT
30.0000 | PACK | Freq: Once | INTRAVENOUS | Status: AC | PRN
  Administered 2018-05-07: 33.72 via INTRAVENOUS

## 2018-05-07 MED ORDER — REGADENOSON 0.4 MG/5ML IV SOLN
0.4000 mg | Freq: Once | INTRAVENOUS | Status: AC
  Administered 2018-05-07: 0.4 mg via INTRAVENOUS
  Filled 2018-05-07: qty 5

## 2018-05-07 MED ORDER — PERFLUTREN LIPID MICROSPHERE
1.0000 mL | INTRAVENOUS | Status: AC | PRN
  Administered 2018-05-07: 2 mL via INTRAVENOUS
  Filled 2018-05-07: qty 10

## 2018-05-07 NOTE — Progress Notes (Signed)
*  PRELIMINARY RESULTS* Echocardiogram 2D Echocardiogram has been performed. Definity image enhancer was administered.  Teresa [name] 05/07/2018, 11:28 AM

## 2018-05-18 ENCOUNTER — Other Ambulatory Visit: Payer: Self-pay

## 2018-05-18 ENCOUNTER — Emergency Department: Payer: Medicare Other

## 2018-05-18 ENCOUNTER — Encounter: Payer: Self-pay | Admitting: Emergency Medicine

## 2018-05-18 ENCOUNTER — Emergency Department
Admission: EM | Admit: 2018-05-18 | Discharge: 2018-05-18 | Disposition: A | Discharge status: Home / Self Care | Payer: Medicare Other | Attending: Emergency Medicine | Admitting: Emergency Medicine

## 2018-05-18 DIAGNOSIS — Z7982 Long term (current) use of aspirin: Secondary | ICD-10-CM | POA: Insufficient documentation

## 2018-05-18 DIAGNOSIS — J209 Acute bronchitis, unspecified: Secondary | ICD-10-CM | POA: Diagnosis not present

## 2018-05-18 DIAGNOSIS — E119 Type 2 diabetes mellitus without complications: Secondary | ICD-10-CM | POA: Diagnosis not present

## 2018-05-18 DIAGNOSIS — F17210 Nicotine dependence, cigarettes, uncomplicated: Secondary | ICD-10-CM | POA: Insufficient documentation

## 2018-05-18 DIAGNOSIS — J449 Chronic obstructive pulmonary disease, unspecified: Secondary | ICD-10-CM | POA: Insufficient documentation

## 2018-05-18 DIAGNOSIS — Z794 Long term (current) use of insulin: Secondary | ICD-10-CM | POA: Insufficient documentation

## 2018-05-18 DIAGNOSIS — Z79899 Other long term (current) drug therapy: Secondary | ICD-10-CM | POA: Insufficient documentation

## 2018-05-18 DIAGNOSIS — I10 Essential (primary) hypertension: Secondary | ICD-10-CM | POA: Diagnosis not present

## 2018-05-18 DIAGNOSIS — I2510 Atherosclerotic heart disease of native coronary artery without angina pectoris: Secondary | ICD-10-CM | POA: Insufficient documentation

## 2018-05-18 DIAGNOSIS — J40 Bronchitis, not specified as acute or chronic: Secondary | ICD-10-CM

## 2018-05-18 DIAGNOSIS — R0602 Shortness of breath: Secondary | ICD-10-CM | POA: Diagnosis present

## 2018-05-18 LAB — URINALYSIS, COMPLETE (UACMP) WITH MICROSCOPIC
Bacteria, UA: NONE SEEN
Bilirubin Urine: NEGATIVE
Glucose, UA: NEGATIVE mg/dL
Hgb urine dipstick: NEGATIVE
Ketones, ur: NEGATIVE mg/dL
Leukocytes, UA: NEGATIVE
Nitrite: NEGATIVE
Protein, ur: 30 mg/dL — AB
Specific Gravity, Urine: 1.016 (ref 1.005–1.030)
pH: 7 (ref 5.0–8.0)

## 2018-05-18 LAB — CBC WITH DIFFERENTIAL/PLATELET
Abs Immature Granulocytes: 0.05 10*3/uL (ref 0.00–0.07)
BASOS ABS: 0 10*3/uL (ref 0.0–0.1)
Basophils Relative: 0 %
Eosinophils Absolute: 0.2 10*3/uL (ref 0.0–0.5)
Eosinophils Relative: 2 %
HCT: 36.1 % (ref 36.0–46.0)
Hemoglobin: 11.1 g/dL — ABNORMAL LOW (ref 12.0–15.0)
Immature Granulocytes: 1 %
Lymphocytes Relative: 30 %
Lymphs Abs: 2.5 10*3/uL (ref 0.7–4.0)
MCH: 29.4 pg (ref 26.0–34.0)
MCHC: 30.7 g/dL (ref 30.0–36.0)
MCV: 95.5 fL (ref 80.0–100.0)
Monocytes Absolute: 0.5 10*3/uL (ref 0.1–1.0)
Monocytes Relative: 7 %
NRBC: 0 % (ref 0.0–0.2)
Neutro Abs: 5 10*3/uL (ref 1.7–7.7)
Neutrophils Relative %: 60 %
PLATELETS: 306 10*3/uL (ref 150–400)
RBC: 3.78 MIL/uL — ABNORMAL LOW (ref 3.87–5.11)
RDW: 13 % (ref 11.5–15.5)
WBC: 8.3 10*3/uL (ref 4.0–10.5)

## 2018-05-18 LAB — TROPONIN I: Troponin I: <0.03 ng/mL (ref <0.03)

## 2018-05-18 LAB — BASIC METABOLIC PANEL
Anion gap: 7 (ref 5–15)
BUN: 14 mg/dL (ref 6–20)
CO2: 30 mmol/L (ref 22–32)
Calcium: 9.2 mg/dL (ref 8.9–10.3)
Chloride: 103 mmol/L (ref 98–111)
Creatinine, Ser: 0.99 mg/dL (ref 0.44–1.00)
GFR calc non Af Amer: >60 mL/min (ref >60)
Glucose, Bld: 100 mg/dL — ABNORMAL HIGH (ref 70–99)
POTASSIUM: 3.7 mmol/L (ref 3.5–5.1)
Sodium: 140 mmol/L (ref 135–145)

## 2018-05-18 IMAGING — CR DG CHEST 2V
1 series · 2 of 2 positions shown · non-contrast
Comparison: [DATE]

CLINICAL DATA: Cough, congestion, shortness of breath, wheezing and
chest pain for 2 weeks, history asthma, COPD, coronary artery
disease, hypertension, smoker, diabetes mellitus, heart murmur

EXAM:
CHEST - 2 VIEW

[Series 1: dg chest 2 view · 0.14mm/px · 2 of 2 slices shown]
[im 1/2]
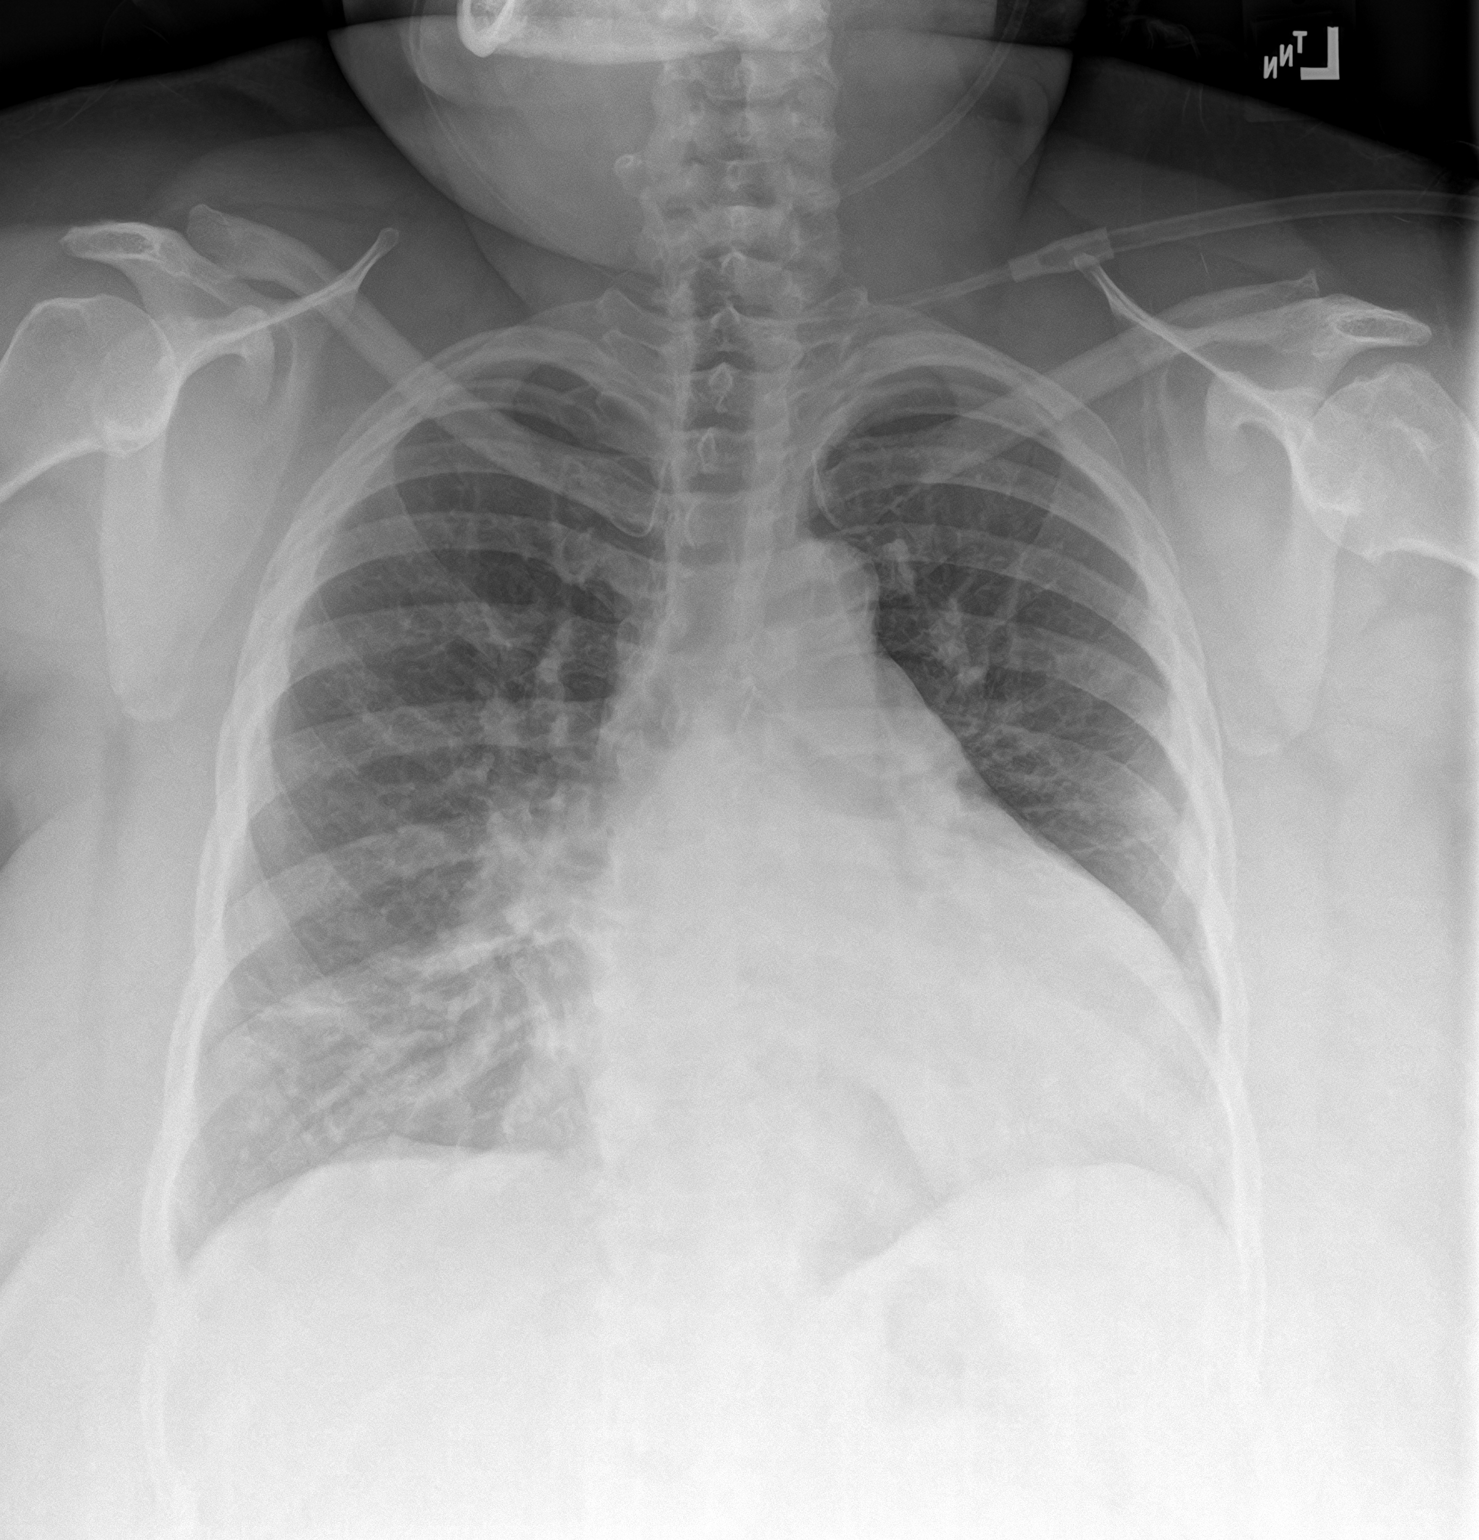
[im 2/2]
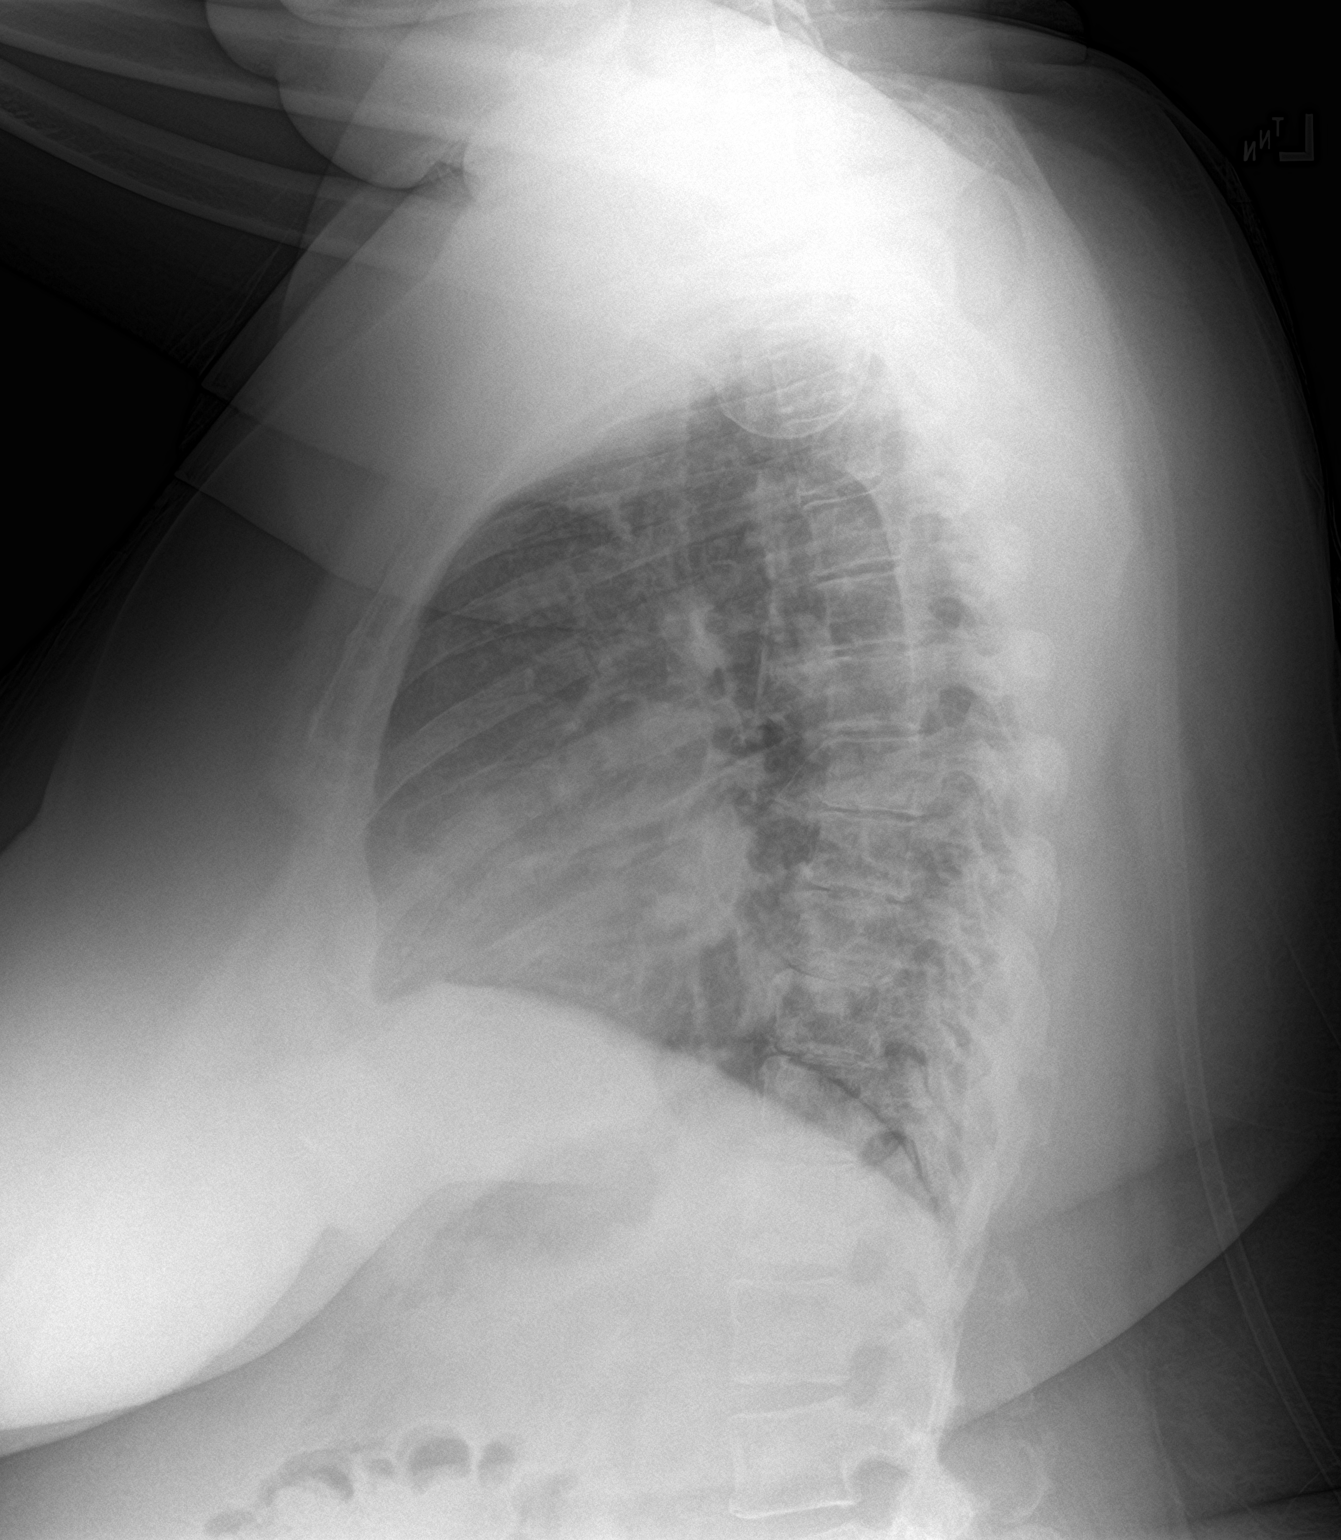

[2 of 2 positions shown; findings below may reference images not displayed]

FINDINGS: Enlargement of cardiac silhouette.

Atherosclerotic calcification aorta.

Mediastinal contours and pulmonary vascularity otherwise normal.

Mild bibasilar atelectasis without acute infiltrate, pleural
effusion, or pneumothorax.

Bones unremarkable.
IMPRESSION: Enlargement of cardiac silhouette with mild bibasilar atelectasis.

## 2018-05-18 MED ORDER — IPRATROPIUM-ALBUTEROL 0.5-2.5 (3) MG/3ML IN SOLN
3.0000 mL | Freq: Once | RESPIRATORY_TRACT | Status: AC
  Administered 2018-05-18: 3 mL via RESPIRATORY_TRACT
  Filled 2018-05-18: qty 3

## 2018-05-18 MED ORDER — AZITHROMYCIN 250 MG PO TABS: ORAL_TABLET | ORAL | 0 refills | Status: DC

## 2018-05-18 MED ORDER — METHYLPREDNISOLONE SODIUM SUCC 125 MG IJ SOLR
125.0000 mg | Freq: Once | INTRAMUSCULAR | Status: AC
  Administered 2018-05-18: 125 mg via INTRAVENOUS
  Filled 2018-05-18: qty 2

## 2018-05-18 MED ORDER — AZITHROMYCIN 500 MG PO TABS
500.0000 mg | ORAL_TABLET | Freq: Once | ORAL | Status: AC
  Administered 2018-05-18: 500 mg via ORAL
  Filled 2018-05-18: qty 1

## 2018-05-18 MED ORDER — PREDNISONE 20 MG PO TABS: 60.0000 mg | ORAL_TABLET | Freq: Every day | ORAL | 0 refills | Status: AC

## 2018-05-18 NOTE — ED Triage Notes (Signed)
Patient reports productive cough and shortness of breath x2 weeks. Denies fever. Also complaining of dysuria and strong odor with urination.

## 2018-05-18 NOTE — ED Provider Notes (Signed)
[facility] Emergency Department Provider Note  ____________________________________________  Time seen: Approximately 2:44 PM  I have reviewed the triage vital signs and the nursing notes.   HISTORY  Chief Complaint Shortness of Breath; Cough; and Dysuria   HPI Teresa [name] is a 58 y.o. female with a history of COPD on 2 L nasal cannula, smoking, chronic kidney disease, coronary artery disease, diabetes who presents for evaluation of congestion and shortness of breath.  Patient reports 2 weeks of cough productive of white phlegm, congestion and shortness of breath.  She reports that she improved but started feeling worse again today.  No fever but has had chills.  No chest pain.  She has been using her inhalers.  She still smoking.  She denies vomiting or diarrhea.  She has had intermittent nausea.  Her shortness of breath is mild at rest and moderate in intensity with minimal exertion.  Patient has had no need to increase her oxygen requirement at home.  Past Medical History:  Diagnosis Date  . Anxiety disorder   . Arthritis   . Asthma   . Bilateral leg edema   . CAD (coronary artery disease)   . Chest pain   . Chronic back pain   . Chronic kidney disease   . COPD (chronic obstructive pulmonary disease) (HCC)   . DDD (degenerative disc disease), lumbar   . Depression   . Diabetes (HCC)   . Diabetic retinopathy (HCC)   . Genital herpes   . Heart burn   . Heart disease   . Heart murmur   . Hepatitis   . HLD (hyperlipidemia)   . Hypertension   . Impetigo   . Microscopic hematuria   . Obesity, morbid (HCC)   . Pancreatitis   . Peripheral neuropathy   . Sleep apnea   . Thyroid disease   . Urinary frequency   . Urinary incontinence   . Ventricular septal defect     Patient Active Problem List   Diagnosis Date Noted  . Morbid obesity (HCC) 10/08/2015  . Cystocele, midline 10/08/2015  . SUI (stress urinary incontinence, female)  10/08/2015  . Menopause 10/08/2015  . Urinary frequency 08/06/2015  . Microscopic hematuria 08/06/2015  . Cystocele, grade 2 08/06/2015  . Morbid (severe) obesity due to excess calories (HCC) 05/15/2015  . TIA (transient ischemic attack) 01/01/2015  . Anterior chest wall pain 09/08/2014  . Nicotine addiction 08/19/2014  . Benign essential HTN 08/04/2014  . Combined fat and carbohydrate induced hyperlipemia 02/12/2014  . 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 Disease of lung 09/06/2013  . Heart valve disease 09/06/2013  . Chronic obstructive pulmonary disease (HCC) 02/19/2013  . Dependence on supplemental oxygen 02/19/2013  . Pulmonary hypertension (HCC) 02/19/2013  . Postprocedural state 02/19/2013  . Other specified postprocedural states 02/19/2013  . 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 Chronic pain 07/23/2012  . Diabetes mellitus (HCC) 07/23/2012  . Clinical depression 09/02/2009  . Major depressive disorder with single episode 09/02/2009  . Essential (primary) hypertension 06/01/2001    Past Surgical History:  Procedure Laterality Date  . CARDIAC SURGERY    . CYST EXCISION    . LUMBAR DISC SURGERY    . WRIST SURGERY Bilateral     Prior to Admission medications   Medication Sig Start Date End Date Taking? Authorizing Provider  acyclovir (ZOVIRAX) 400 MG tablet Take 400 mg by mouth 2 (two) times daily.    [provider]  albuterol (PROVENTIL HFA;VENTOLIN HFA)  108 (90 BASE) MCG/ACT inhaler Inhale 1-2 puffs into the lungs every 6 (six) hours as needed for wheezing or shortness of breath.    [provider]  ALPRAZolam Prudy Feeler) 1 MG tablet Take 1 mg by mouth 3 (three) times daily as needed for anxiety. 3 to 4 times daily PRN    [provider]  amLODipine (NORVASC) 5 MG tablet Take 5 mg by mouth daily.    [provider]  aspirin 81 MG tablet Take 1 tablet (81 mg total) by mouth daily. 01/02/15   Altamese Dilling, MD    atorvastatin (LIPITOR) 20 MG tablet Take 20 mg by mouth at bedtime. 08/31/16   [provider]  azithromycin (ZITHROMAX) 250 MG tablet Take 1 a day for 4 days 05/18/18   Don Perking, [place], MD  budesonide-formoterol [facility]) 160-4.5 MCG/ACT inhaler Inhale 2 puffs into the lungs 2 (two) times daily.  11/10/15   [provider]  DULoxetine (CYMBALTA) 30 MG capsule Take by mouth. 12/22/17 12/22/18  [provider]  enalapril (VASOTEC) 5 MG tablet Take 5 mg by mouth daily.    [provider]  fluticasone (FLONASE) 50 MCG/ACT nasal spray Place 1 spray into both nostrils daily as needed.  06/04/15   [provider]  furosemide (LASIX) 40 MG tablet Take 40 mg by mouth daily.    [provider]  gabapentin (NEURONTIN) 300 MG capsule Take 300 mg by mouth 3 (three) times daily.  08/25/15   [provider]  insulin lispro (HUMALOG) 100 UNIT/ML injection Inject 4-10 Units into the skin 3 (three) times daily with meals. Sliding scale    [provider]  LANTUS SOLOSTAR 100 UNIT/ML Solostar Pen  01/06/18   [provider]  metoprolol tartrate (LOPRESSOR) 25 MG tablet Take 25 mg by mouth 2 (two) times daily. Reported on 08/06/2015    [provider]  naproxen (EC NAPROSYN) 500 MG EC tablet Take 1 tablet (500 mg total) by mouth 2 (two) times daily with a meal. 10/18/15   Menshew, Jenise V Bacon, PA-C  NICODERM CQ 7 MG/24HR patch Place 7 mg onto the skin daily.  04/28/16   [provider]  NYSTATIN powder  01/06/18   [provider]  ondansetron (ZOFRAN ODT) 4 MG disintegrating tablet Take 1 tablet (4 mg total) by mouth every 6 (six) hours as needed for nausea. 04/06/17   Hildred Laser, MD  OXYGEN Inhale 2 Units into the lungs.     [provider]  OXYQUINOLONE SULFATE VAGINAL (TRIMO-SAN) 0.025 % GEL Place 0.025 Applicatorfuls vaginally once a week. 12/26/17   Defrancesco, Prentice Docker, MD  polyethylene glycol  powder (GLYCOLAX/MIRALAX) powder Take 17 g by mouth daily as needed.  11/06/15   [provider]  predniSONE (DELTASONE) 20 MG tablet Take 3 tablets (60 mg total) by mouth daily for 4 days. 05/18/18 05/22/18  Nita Sickle, MD  tiotropium (SPIRIVA) 18 MCG inhalation capsule Place 18 mcg into inhaler and inhale daily.     [provider]  traZODone (DESYREL) 100 MG tablet Take by mouth at bedtime as needed.     [provider]    Allergies Other; Penicillins; Sulfa antibiotics; Amoxicillin; and Morphine  Family History  Problem Relation Age of Onset  . Diabetes Other        2 Brother 2 Sister  . Hypertension Other   . Kidney disease Neg Hx   . Bladder Cancer Neg Hx   . Kidney cancer Neg Hx  Social History Social History   Tobacco Use  . Smoking status: Light Tobacco Smoker    Types: Cigarettes  . Smokeless tobacco: Never Used  Substance Use Topics  . Alcohol use: No    Alcohol/week: 0.0 standard drinks  . Drug use: No    Review of Systems  Constitutional: Negative for fever. + chills Eyes: Negative for visual changes. ENT: Negative for sore throat. + congestion Neck: No neck pain  Cardiovascular: Negative for chest pain. Respiratory: + shortness of breath, cough Gastrointestinal: Negative for abdominal pain, vomiting or diarrhea. Genitourinary: Negative for dysuria. Musculoskeletal: Negative for back pain. Skin: Negative for rash. Neurological: Negative for headaches, weakness or numbness. Psych: No SI or HI  ____________________________________________   PHYSICAL EXAM:  VITAL SIGNS: ED Triage Vitals  Enc Vitals Group     BP 05/18/18 1156 (!) 137/58     Pulse Rate 05/18/18 1156 78     Resp 05/18/18 1156 18     Temp 05/18/18 1156 98.8 F (37.1 C)     Temp Source 05/18/18 1156 Oral     SpO2 05/18/18 1156 95 %     Weight 05/18/18 1157 239 lb (108.4 kg)     Height 05/18/18 1157 5\' 2"  (1.575 m)     Head Circumference --       Peak Flow --      Pain Score 05/18/18 1157 7     Pain Loc --      Pain Edu? --      Excl. in GC? --     Constitutional: Alert and oriented. Well appearing and in no apparent distress. HEENT:      Head: Normocephalic and atraumatic.         Eyes: Conjunctivae are normal. Sclera is non-icteric.       Mouth/Throat: Mucous membranes are moist.       Neck: Supple with no signs of meningismus. Cardiovascular: Regular rate and rhythm. No murmurs, gallops, or rubs. 2+ symmetrical distal pulses are present in all extremities. No JVD. Respiratory: Normal respiratory effort. Lungs are clear to auscultation bilaterally with severely diminished air movement bilaterally. No wheezes, crackles, or rhonchi.  Gastrointestinal: Soft, non tender, and non distended with positive bowel sounds. No rebound or guarding. Musculoskeletal: 1+ pitting edema bilaterally Neurologic: Normal speech and language. Face is symmetric. Moving all extremities. No gross focal neurologic deficits are appreciated. Skin: Skin is warm, dry and intact. No rash noted. Psychiatric: Mood and affect are normal. Speech and behavior are normal.  ____________________________________________   LABS (all labs ordered are listed, but only abnormal results are displayed)  Labs Reviewed  URINALYSIS, COMPLETE (UACMP) WITH MICROSCOPIC - Abnormal; Notable for the following components:      Result Value   Color, Urine YELLOW (*)    APPearance CLEAR (*)    Protein, ur 30 (*)    All other components within normal limits  CBC WITH DIFFERENTIAL/PLATELET - Abnormal; Notable for the following components:   RBC 3.78 (*)    Hemoglobin 11.1 (*)    All other components within normal limits  BASIC METABOLIC PANEL - Abnormal; Notable for the following components:   Glucose, Bld 100 (*)    All other components within normal limits  TROPONIN I   ____________________________________________  EKG  ED ECG REPORT I, Nita Sicklearolina Radonna Bracher, the  attending physician, personally viewed and interpreted this ECG.  Normal sinus rhythm, rate of 78, normal intervals, normal axis, no ST elevations or depressions, diffuse T wave flattening.  No significant changes when compared to prior from 2016. ____________________________________________  RADIOLOGY  I have personally reviewed the images performed during this visit and I agree with the Radiologist's read.   Interpretation by Radiologist:  Dg Chest 2 View  Result Date: 05/18/2018 CLINICAL DATA:  Cough, congestion, shortness of breath, wheezing and chest pain for 2 weeks, history asthma, COPD, coronary artery disease, hypertension, smoker, diabetes mellitus, heart murmur EXAM: CHEST - 2 VIEW COMPARISON:  12/06/2014 FINDINGS: Enlargement of cardiac silhouette. Atherosclerotic calcification aorta. Mediastinal contours and pulmonary vascularity otherwise normal. Mild bibasilar atelectasis without acute infiltrate, pleural effusion, or pneumothorax. Bones unremarkable. IMPRESSION: Enlargement of cardiac silhouette with mild bibasilar atelectasis. Electronically Signed   By: Ulyses SouthwardMark  Boles M.D.   On: 05/18/2018 12:45     ____________________________________________   PROCEDURES  Procedure(s) performed: None Procedures Critical Care performed:  None ____________________________________________   INITIAL IMPRESSION / ASSESSMENT AND PLAN / ED COURSE   58 y.o. female with a history of COPD on 2 L nasal cannula, smoking, chronic kidney disease, coronary artery disease, diabetes who presents for evaluation of congestion, cough, chills and shortness of breath x 2 weeks. Recent stress test and echo WNL.   Ddx bronchitis, pna, viral URI, influenza.  Plan for labs, cxr, duonebs, steroids   Clinical Course as of May 19 1915  [facility]  1641 She feels markedly improved.  Moving good air.  Labs are no acute findings.  Will discharge home on Z-Pak, prednisone, and albuterol for bronchitis  versus COPD exacerbation.  Discuss smoking cessation, close follow-up, and return precautions with patient.   [CV]    Clinical Course User Index [CV] Don PerkingVeronese, [place], MD     As part of my medical decision making, I reviewed the following data within the electronic MEDICAL RECORD NUMBER Nursing notes reviewed and incorporated, Labs reviewed , EKG interpreted , Old EKG reviewed, Old chart reviewed, Radiograph reviewed , Notes from prior ED visits and [place] Controlled Substance Database    Pertinent labs & imaging results that were available during my care of the patient were reviewed by me and considered in my medical decision making (see chart for details).    ____________________________________________   FINAL CLINICAL IMPRESSION(S) / ED DIAGNOSES  Final diagnoses:  Bronchitis      NEW MEDICATIONS STARTED DURING THIS VISIT:  ED Discharge Orders         Ordered    predniSONE (DELTASONE) 20 MG tablet  Daily     05/18/18 1640    azithromycin (ZITHROMAX) 250 MG tablet     05/18/18 1640           Note:  This document was prepared using Dragon voice recognition software and may include unintentional dictation errors.    Don PerkingVeronese, [place], MD 05/18/18 [id number]

## 2018-08-09 ENCOUNTER — Emergency Department
Admission: EM | Admit: 2018-08-09 | Discharge: 2018-08-09 | Disposition: A | Discharge status: Home / Self Care | Payer: Medicare Other | Attending: Emergency Medicine | Admitting: Emergency Medicine

## 2018-08-09 ENCOUNTER — Emergency Department: Payer: Medicare Other

## 2018-08-09 ENCOUNTER — Encounter: Payer: Self-pay | Admitting: Emergency Medicine

## 2018-08-09 ENCOUNTER — Other Ambulatory Visit: Payer: Self-pay

## 2018-08-09 DIAGNOSIS — J449 Chronic obstructive pulmonary disease, unspecified: Secondary | ICD-10-CM | POA: Insufficient documentation

## 2018-08-09 DIAGNOSIS — I129 Hypertensive chronic kidney disease with stage 1 through stage 4 chronic kidney disease, or unspecified chronic kidney disease: Secondary | ICD-10-CM | POA: Insufficient documentation

## 2018-08-09 DIAGNOSIS — N189 Chronic kidney disease, unspecified: Secondary | ICD-10-CM | POA: Insufficient documentation

## 2018-08-09 DIAGNOSIS — R0789 Other chest pain: Secondary | ICD-10-CM | POA: Insufficient documentation

## 2018-08-09 DIAGNOSIS — E079 Disorder of thyroid, unspecified: Secondary | ICD-10-CM | POA: Diagnosis not present

## 2018-08-09 DIAGNOSIS — Z79899 Other long term (current) drug therapy: Secondary | ICD-10-CM | POA: Diagnosis not present

## 2018-08-09 DIAGNOSIS — I2510 Atherosclerotic heart disease of native coronary artery without angina pectoris: Secondary | ICD-10-CM | POA: Insufficient documentation

## 2018-08-09 DIAGNOSIS — R05 Cough: Secondary | ICD-10-CM | POA: Diagnosis present

## 2018-08-09 DIAGNOSIS — F17210 Nicotine dependence, cigarettes, uncomplicated: Secondary | ICD-10-CM | POA: Insufficient documentation

## 2018-08-09 DIAGNOSIS — Z9981 Dependence on supplemental oxygen: Secondary | ICD-10-CM | POA: Insufficient documentation

## 2018-08-09 LAB — CBC
HCT: 36 % (ref 36.0–46.0)
Hemoglobin: 11.1 g/dL — ABNORMAL LOW (ref 12.0–15.0)
MCH: 29 pg (ref 26.0–34.0)
MCHC: 30.8 g/dL (ref 30.0–36.0)
MCV: 94 fL (ref 80.0–100.0)
Platelets: 280 10*3/uL (ref 150–400)
RBC: 3.83 MIL/uL — ABNORMAL LOW (ref 3.87–5.11)
RDW: 13.1 % (ref 11.5–15.5)
WBC: 7.8 10*3/uL (ref 4.0–10.5)
nRBC: 0 % (ref 0.0–0.2)

## 2018-08-09 LAB — TROPONIN I
Troponin I: <0.03 ng/mL (ref <0.03)
Troponin I: <0.03 ng/mL (ref <0.03)

## 2018-08-09 LAB — BASIC METABOLIC PANEL
Anion gap: 8 (ref 5–15)
BUN: 13 mg/dL (ref 6–20)
CO2: 28 mmol/L (ref 22–32)
Calcium: 9.2 mg/dL (ref 8.9–10.3)
Chloride: 104 mmol/L (ref 98–111)
Creatinine, Ser: 0.86 mg/dL (ref 0.44–1.00)
GFR calc Af Amer: >60 mL/min (ref >60)
GFR calc non Af Amer: >60 mL/min (ref >60)
Glucose, Bld: 169 mg/dL — ABNORMAL HIGH (ref 70–99)
Potassium: 3.8 mmol/L (ref 3.5–5.1)
Sodium: 140 mmol/L (ref 135–145)

## 2018-08-09 IMAGING — CR CHEST - 2 VIEW
1 series · 2 of 2 positions shown · non-contrast
Comparison: Chest x-ray dated [DATE].

CLINICAL DATA: Cough, left-sided chest pain.

EXAM:
CHEST - 2 VIEW

[Series 1: dg chest 2 view · 0.14mm/px · 2 of 2 slices shown]
[im 1/2]
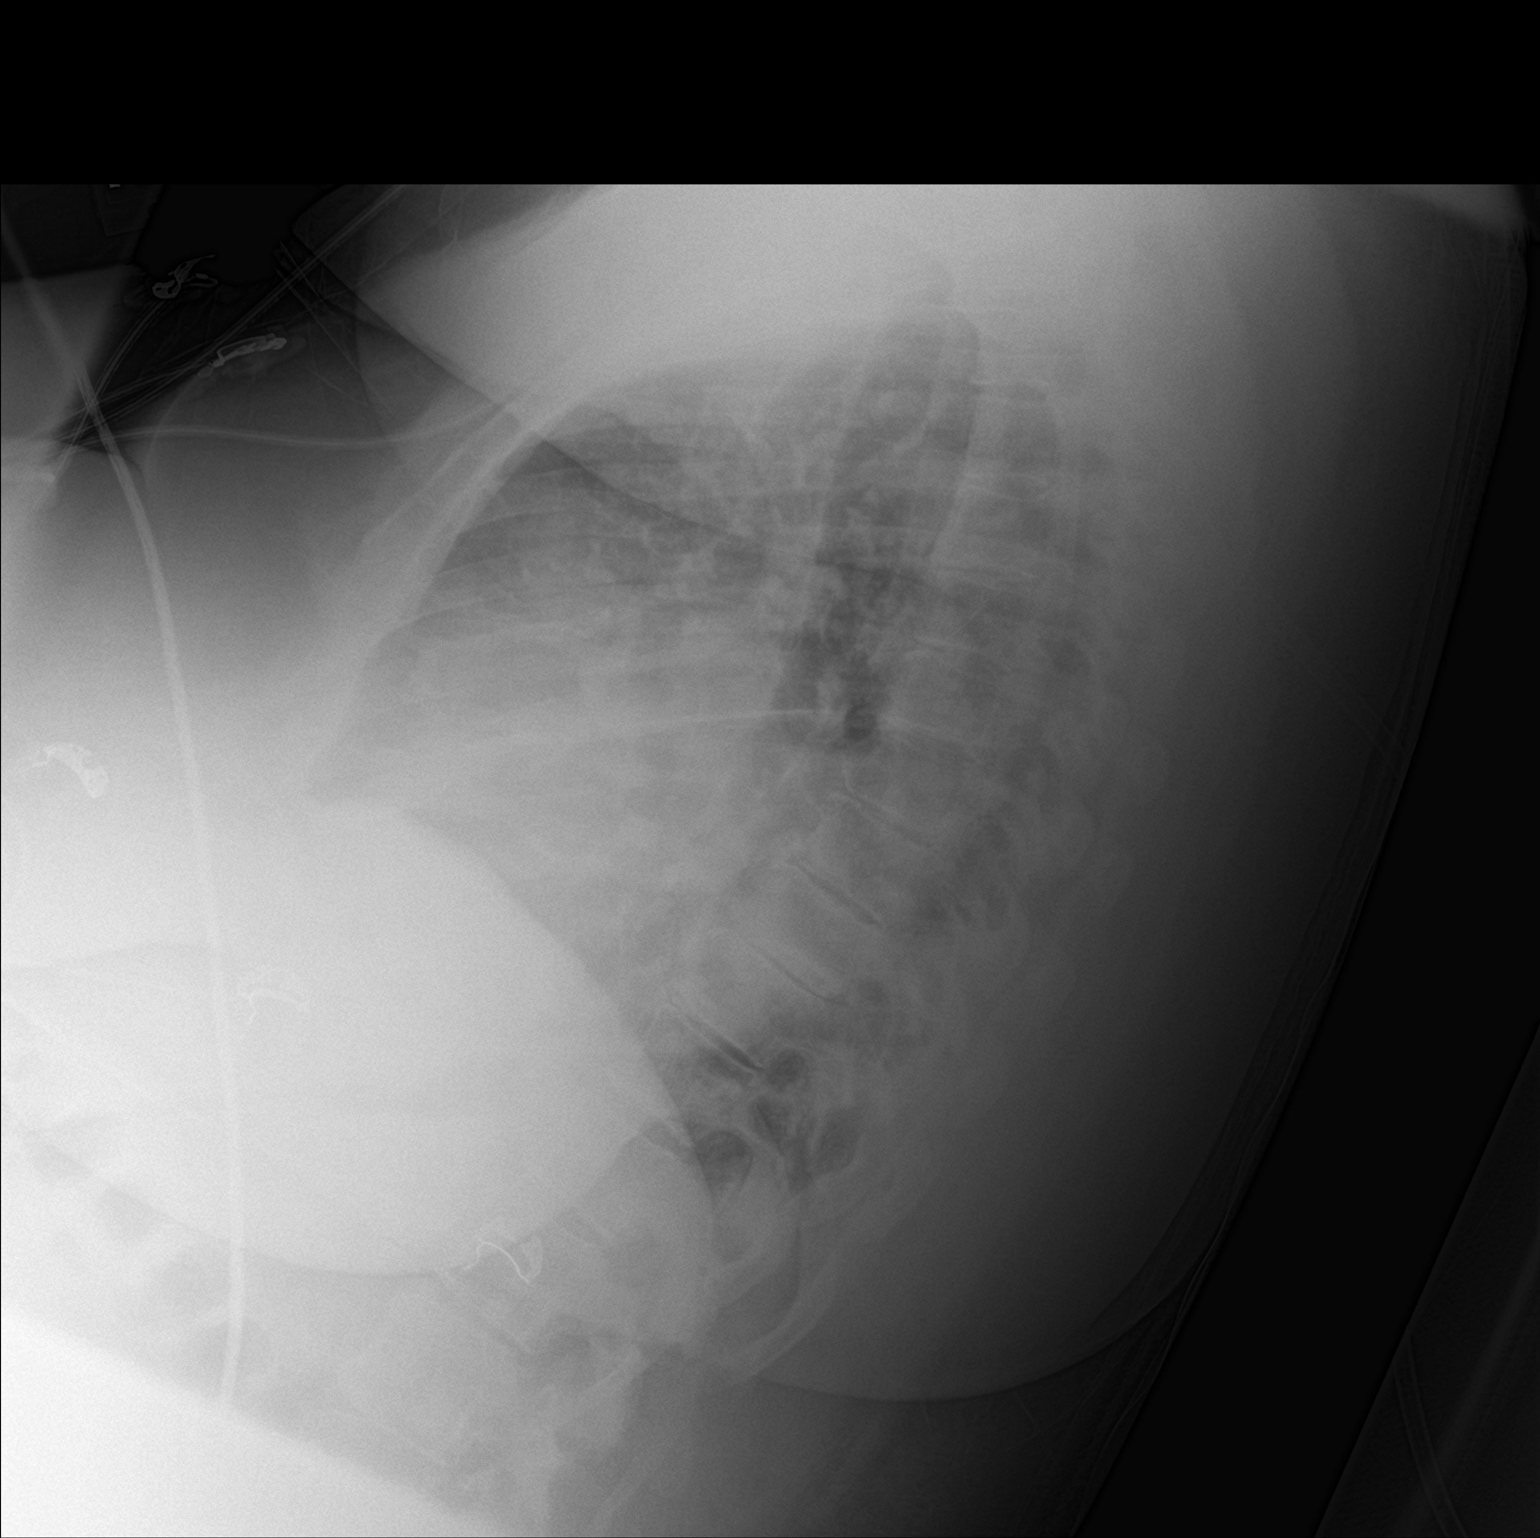
[im 2/2]
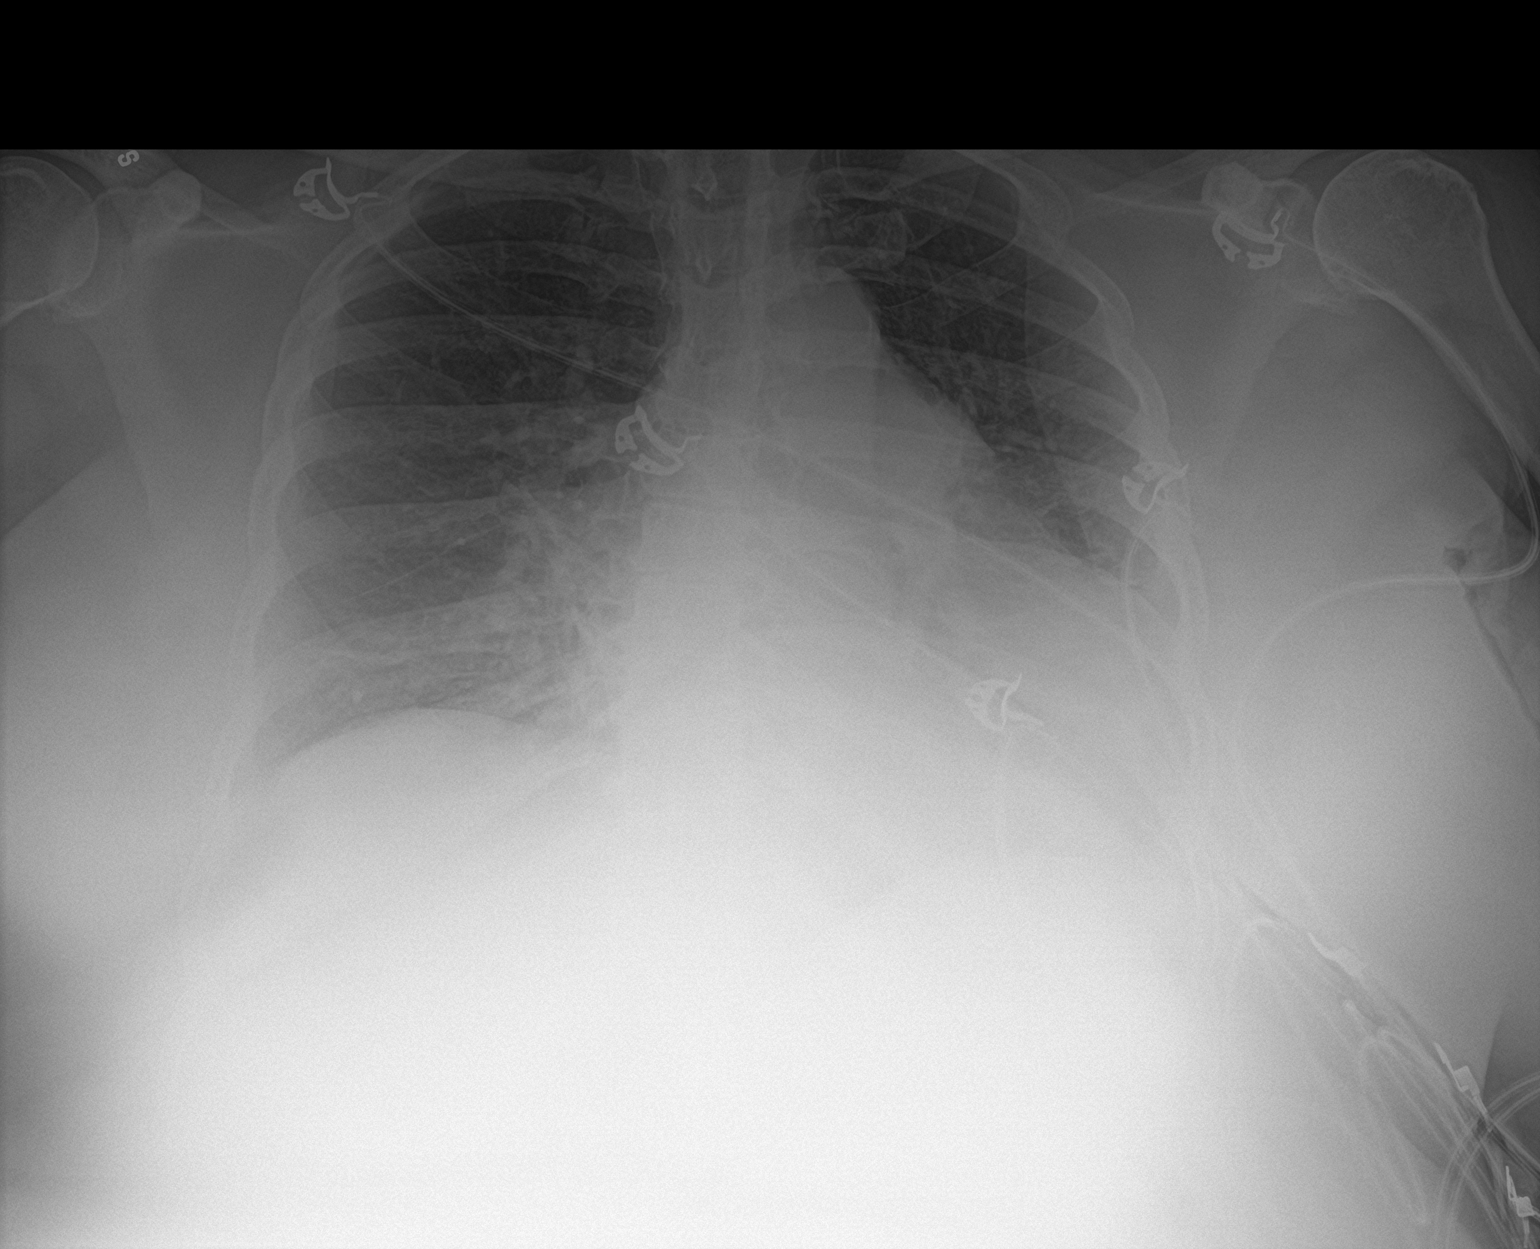

[2 of 2 positions shown; findings below may reference images not displayed]

FINDINGS: Stable cardiomegaly. Atherosclerotic calcification of the aortic
arch. Normal pulmonary vascularity. Streaky linear subsegmental
atelectasis at the right lung base. No focal consolidation, pleural
effusion, or pneumothorax. No acute osseous abnormality.
IMPRESSION: No active cardiopulmonary disease.

## 2018-08-09 IMAGING — CT CT ANGIOGRAPHY CHEST
2 of 6 series · 18 of 46 positions shown · IV contrast (omnipaque)
Comparison: Chest radiograph [DATE]

CLINICAL DATA: Cough and shortness of breath

EXAM:
CT ANGIOGRAPHY CHEST WITH CONTRAST
TECHNIQUE: Multidetector CT imaging of the chest was performed using the
standard protocol during bolus administration of intravenous
contrast. Multiplanar CT image reconstructions and MIPs were
obtained to evaluate the vascular anatomy.
CONTRAST:  75mL OMNIPAQUE IOHEXOL 350 MG/ML SOLN

[Series 5: thins · axial · 0.62mm/px · z∈[-313,-54]mm · 16 of 285 slices shown]
[im 13/285  lung]
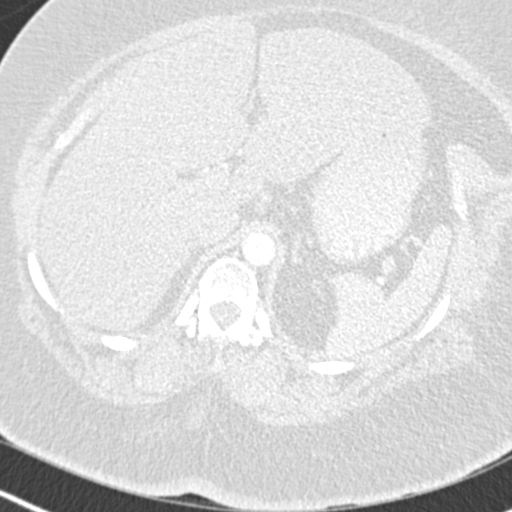
[im 38/285  soft-tissue]
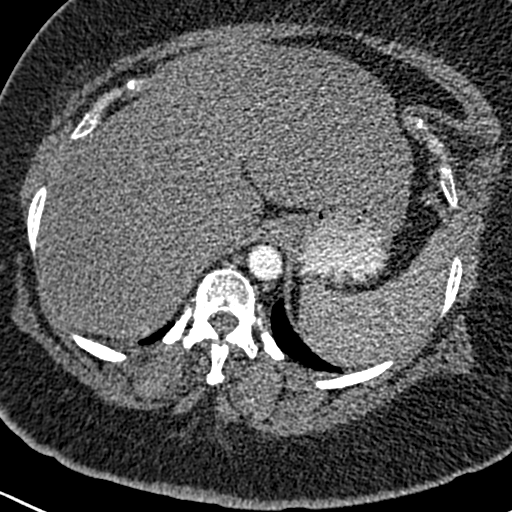
[im 50/285  lung]
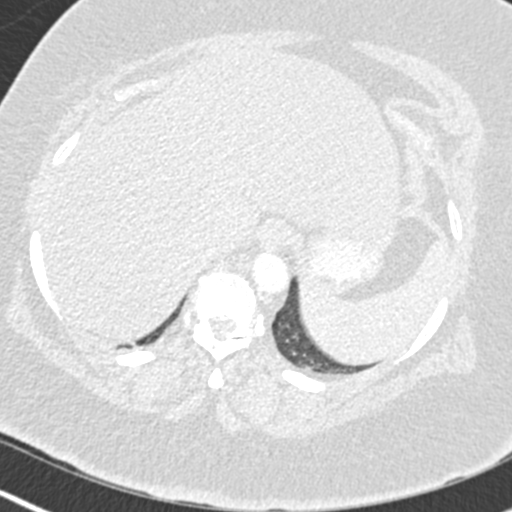
[im 62/285  soft-tissue]
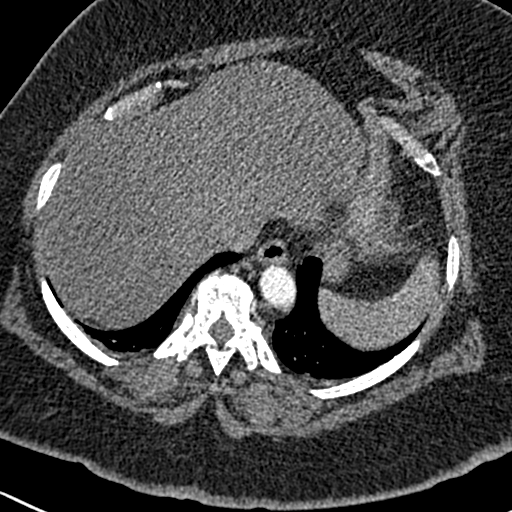
[im 87/285  lung]
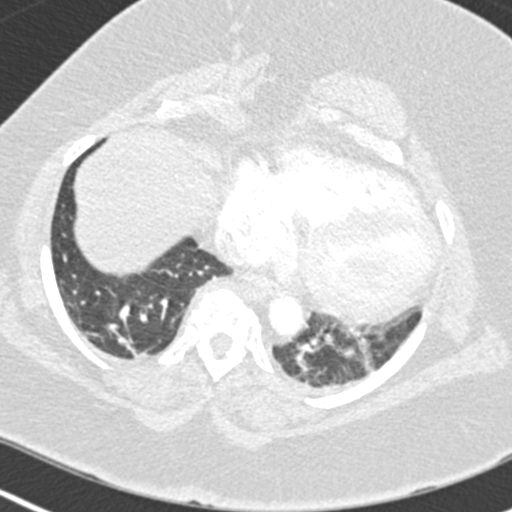
[im 99/285  soft-tissue]
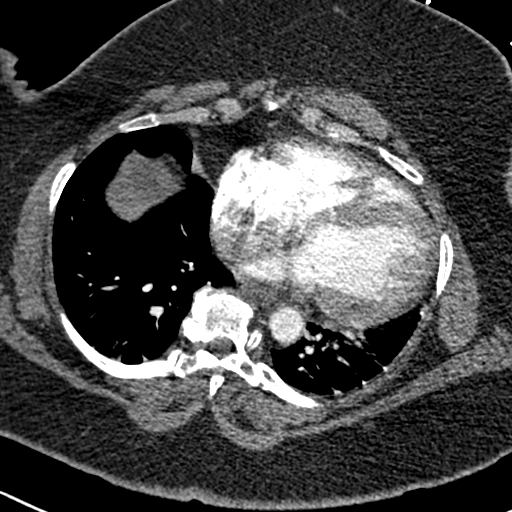
[im 112/285  lung]
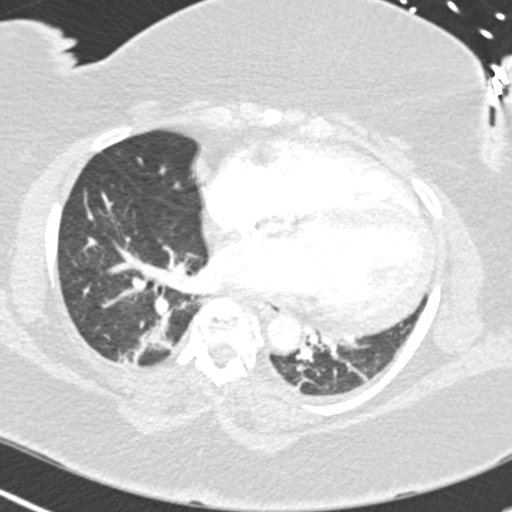
[im 136/285  soft-tissue]
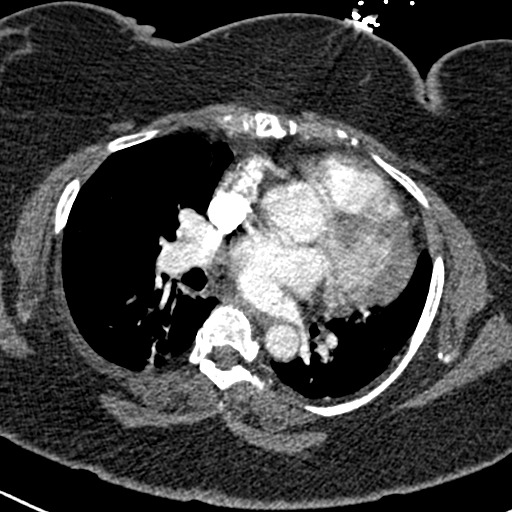
[im 149/285  lung]
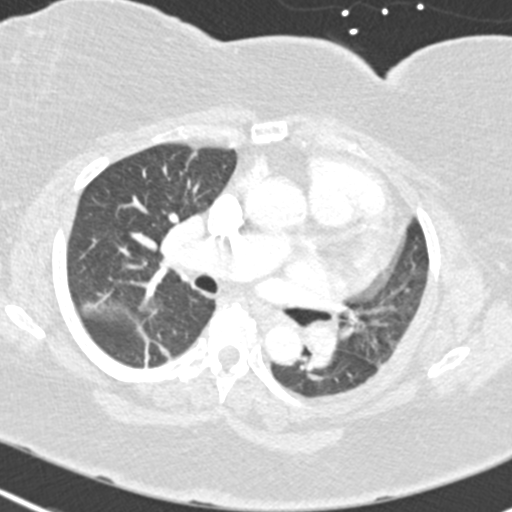
[im 173/285  soft-tissue]
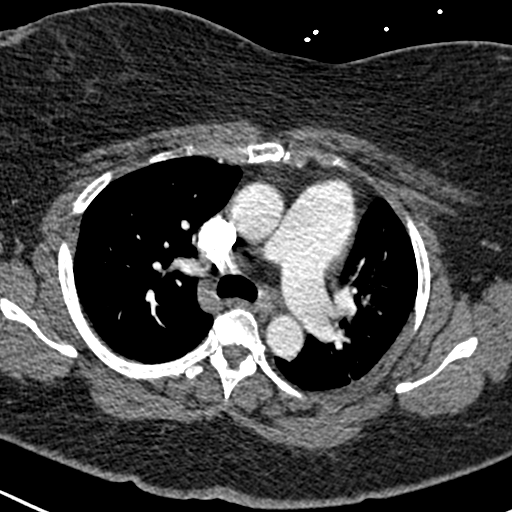
[im 186/285  lung]
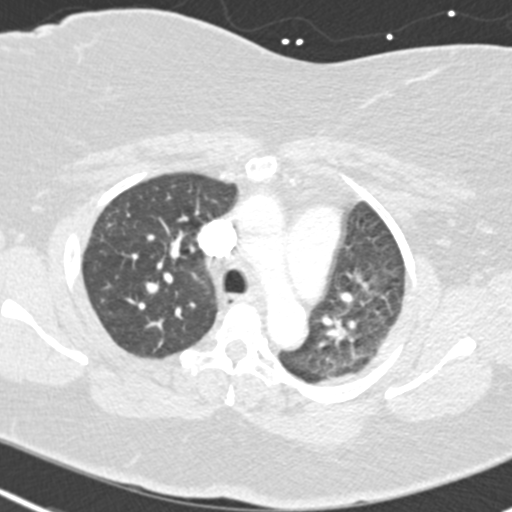
[im 198/285  soft-tissue]
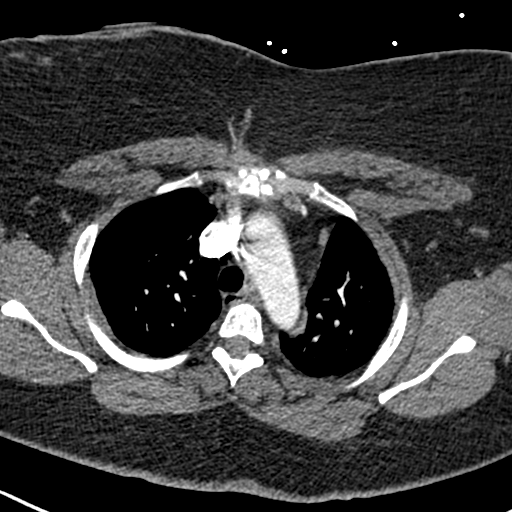
[im 223/285  lung]
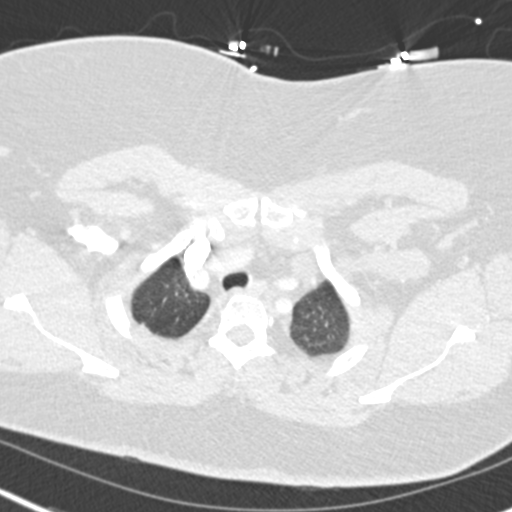
[im 235/285  soft-tissue]
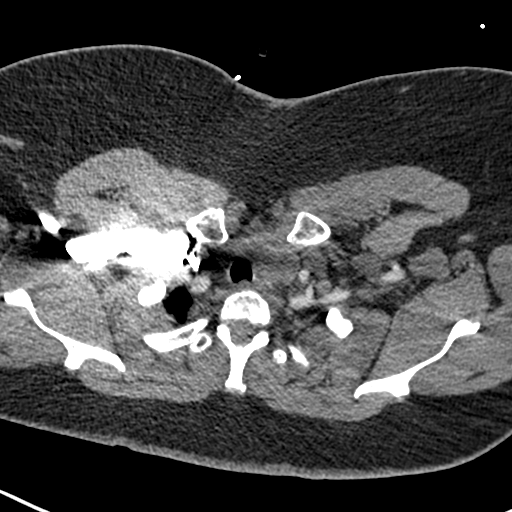
[im 247/285  lung]
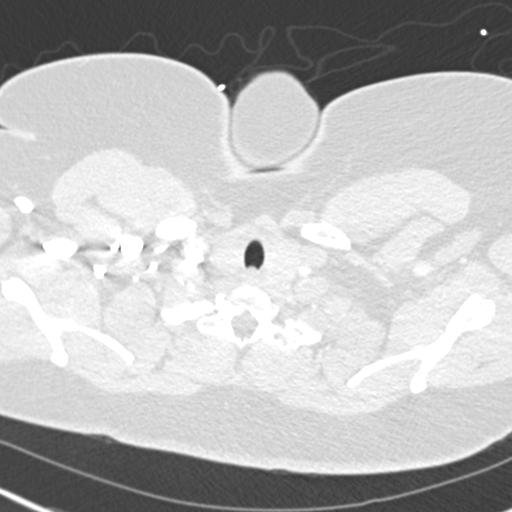
[im 272/285  soft-tissue]
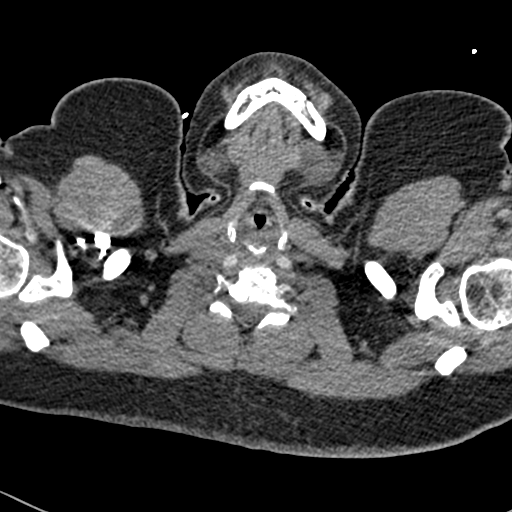

[Series 7: coronal mpr · coronal · 0.56mm/px · 2 of 106 slices shown]
[im 36/106  soft-tissue]
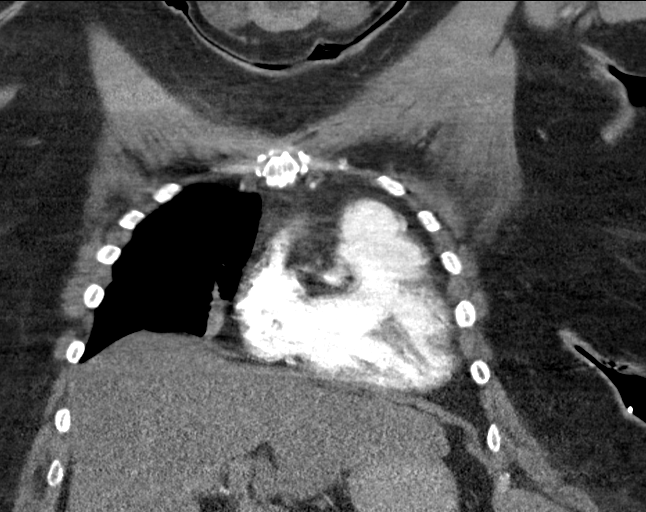
[im 71/106  soft-tissue]
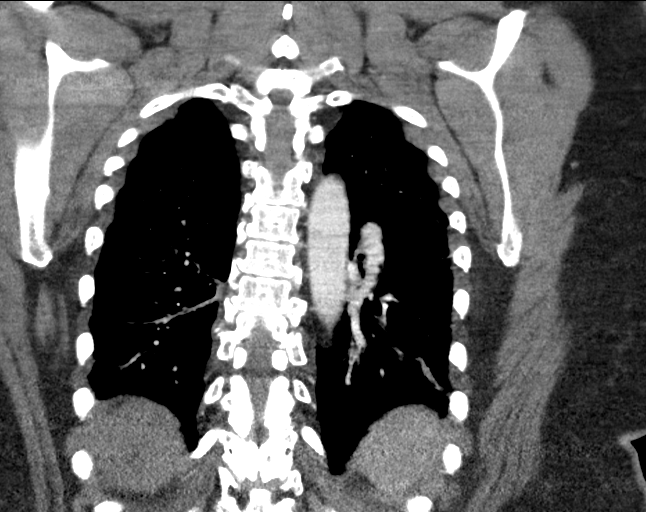

[18 of 46 positions shown; findings below may reference images not displayed]

FINDINGS: Cardiovascular: There is no demonstrable pulmonary embolus. There is
no thoracic aortic aneurysm or dissection. The visualized great
vessels appear normal. There are foci of aortic atherosclerosis.
There is no pericardial effusion or pericardial thickening. Heart is
enlarged. There is prominence of the main pulmonary outflow tract,
measuring 3.9 cm in diameter.

Mediastinum/Nodes: Thyroid overall appears prominent. There are
apparent mass lesions in the left lobe of thyroid with an apparent
mass more inferiorly in the left lobe measuring approximately 2 x
1.5 cm.

There is no appreciable thoracic adenopathy. No esophageal lesions
are evident.

Lungs/Pleura: There is patchy atelectatic change bilaterally, most
notably in the lower lobe regions. There is no frank edema or
consolidation. No appreciable pleural effusion or pleural
thickening.

Upper Abdomen: There is hepatic steatosis. Visualized upper
abdominal structures otherwise appear unremarkable.

Musculoskeletal: There is degenerative change in the thoracic spine.
There are no blastic or lytic bone lesions. No evident chest wall
lesions.

Review of the MIP images confirms the above findings.
IMPRESSION: 1. No demonstrable pulmonary embolus. No thoracic aortic aneurysm or
dissection. There are foci of aortic atherosclerosis.

2. Prominence of the main pulmonary outflow tract measuring 3.9 cm
in diameter. This is a finding indicative pulmonary arterial
hypertension.

3. Areas of atelectatic change bilaterally. No edema or
consolidation.

4.  No evident thoracic adenopathy.

5. **An incidental finding of potential clinical significance has
been found. Dominant mass arising from the left lobe of the thyroid.
Consider further evaluation with nonemergent thyroid ultrasound. If
patient is clinically hyperthyroid, consider nuclear medicine
thyroid uptake and scan.**

6.  Hepatic steatosis.

Aortic Atherosclerosis ([I3]-[I3]).

## 2018-08-09 MED ORDER — KETOROLAC TROMETHAMINE 30 MG/ML IJ SOLN
30.0000 mg | Freq: Once | INTRAMUSCULAR | Status: AC
  Administered 2018-08-09: 30 mg via INTRAVENOUS
  Filled 2018-08-09: qty 1

## 2018-08-09 MED ORDER — IOHEXOL 350 MG/ML SOLN
75.0000 mL | Freq: Once | INTRAVENOUS | Status: AC | PRN
  Administered 2018-08-09: 75 mL via INTRAVENOUS

## 2018-08-09 MED ORDER — SODIUM CHLORIDE 0.9% FLUSH: 3.0000 mL | Freq: Once | INTRAVENOUS | Status: DC

## 2018-08-09 NOTE — Discharge Instructions (Addendum)
As explained to you, your CT shows a mass in your thyroid.  It is important that you follow-up with your primary care doctor for further evaluation of this mass and to rule out cancer.  Also follow-up with your doctor for your chest pain.  Return to the emergency room for new or worsening chest pain, shortness of breath or fever.

## 2018-08-09 NOTE — ED Triage Notes (Signed)
Pt here from home via EMS with c/o cough for 3 weeks, worsening this am after waking, states left sided cp with cough, feeling heart palpitations as well. Hx of COPD, on home O2, 2L per [place] all the time. Currently states nausea, no respiratory distress at this time, pt denies fever other than low grade this am under 100. 98.4 orally at this time.

## 2018-08-09 NOTE — ED Provider Notes (Signed)
[facility] Emergency Department Provider Note   ____________________________________________    I have reviewed the triage vital signs and the nursing notes.   HISTORY  Chief Complaint Cough and chest pain    HPI Teresa [name] is a 58 y.o. female who presents with complaints of cough and chest pain.  Patient reports she has been coughing for several weeks, she does have a history of COPD she is on 2 L nasal cannula as needed, cough is nonproductive.  Reports low-grade fever this morning.  No recent travel.  No exposure to COVID-19 patients.  Patient describes intermittent left-sided chest discomfort today which is what made her present to the emergency department, she reports it is pleuritic in nature has not take anything for this.  Past Medical History:  Diagnosis Date  . Anxiety disorder   . Arthritis   . Asthma   . Bilateral leg edema   . CAD (coronary artery disease)   . Chest pain   . Chronic back pain   . Chronic kidney disease   . COPD (chronic obstructive pulmonary disease) (HCC)   . DDD (degenerative disc disease), lumbar   . Depression   . Diabetes (HCC)   . Diabetic retinopathy (HCC)   . Genital herpes   . Heart burn   . Heart disease   . Heart murmur   . Hepatitis   . HLD (hyperlipidemia)   . Hypertension   . Impetigo   . Microscopic hematuria   . Obesity, morbid (HCC)   . Pancreatitis   . Peripheral neuropathy   . Sleep apnea   . Thyroid disease   . Urinary frequency   . Urinary incontinence   . Ventricular septal defect     Patient Active Problem List   Diagnosis Date Noted  . Morbid obesity (HCC) 10/08/2015  . Cystocele, midline 10/08/2015  . SUI (stress urinary incontinence, female) 10/08/2015  . Menopause 10/08/2015  . Urinary frequency 08/06/2015  . Microscopic hematuria 08/06/2015  . Cystocele, grade 2 08/06/2015  . Morbid (severe) obesity due to excess calories (HCC) 05/15/2015  . TIA (transient  ischemic attack) 01/01/2015  . Anterior chest wall pain 09/08/2014  . Nicotine addiction 08/19/2014  . Benign essential HTN 08/04/2014  . Combined fat and carbohydrate induced hyperlipemia 02/12/2014  . 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 Disease of lung 09/06/2013  . Heart valve disease 09/06/2013  . Chronic obstructive pulmonary disease (HCC) 02/19/2013  . Dependence on supplemental oxygen 02/19/2013  . Pulmonary hypertension (HCC) 02/19/2013  . Postprocedural state 02/19/2013  . Other specified postprocedural states 02/19/2013  . 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 Chronic pain 07/23/2012  . Diabetes mellitus (HCC) 07/23/2012  . Clinical depression 09/02/2009  . Major depressive disorder with single episode 09/02/2009  . Essential (primary) hypertension 06/01/2001    Past Surgical History:  Procedure Laterality Date  . CARDIAC SURGERY    . CYST EXCISION    . LUMBAR DISC SURGERY    . WRIST SURGERY Bilateral     Prior to Admission medications   Medication Sig Start Date End Date Taking? Authorizing Provider  acyclovir (ZOVIRAX) 400 MG tablet Take 400 mg by mouth 2 (two) times daily.    [provider]  albuterol (PROVENTIL HFA;VENTOLIN HFA) 108 (90 BASE) MCG/ACT inhaler Inhale 1-2 puffs into the lungs every 6 (six) hours as needed for wheezing or shortness of breath.    [provider]  ALPRAZolam Prudy Feeler) 1 MG tablet Take 1  mg by mouth 3 (three) times daily as needed for anxiety. 3 to 4 times daily PRN    [provider]  amLODipine (NORVASC) 5 MG tablet Take 5 mg by mouth daily.    [provider]  aspirin 81 MG tablet Take 1 tablet (81 mg total) by mouth daily. 01/02/15   Altamese Dilling, MD  atorvastatin (LIPITOR) 20 MG tablet Take 20 mg by mouth at bedtime. 08/31/16   [provider]  azithromycin (ZITHROMAX) 250 MG tablet Take 1 a day for 4 days 05/18/18   Don Perking, [place], MD  budesonide-formoterol  [facility]) 160-4.5 MCG/ACT inhaler Inhale 2 puffs into the lungs 2 (two) times daily.  11/10/15   [provider]  DULoxetine (CYMBALTA) 30 MG capsule Take by mouth. 12/22/17 12/22/18  [provider]  enalapril (VASOTEC) 5 MG tablet Take 5 mg by mouth daily.    [provider]  fluticasone (FLONASE) 50 MCG/ACT nasal spray Place 1 spray into both nostrils daily as needed.  06/04/15   [provider]  furosemide (LASIX) 40 MG tablet Take 40 mg by mouth daily.    [provider]  gabapentin (NEURONTIN) 300 MG capsule Take 300 mg by mouth 3 (three) times daily.  08/25/15   [provider]  insulin lispro (HUMALOG) 100 UNIT/ML injection Inject 4-10 Units into the skin 3 (three) times daily with meals. Sliding scale    [provider]  LANTUS SOLOSTAR 100 UNIT/ML Solostar Pen  01/06/18   [provider]  metoprolol tartrate (LOPRESSOR) 25 MG tablet Take 25 mg by mouth 2 (two) times daily. Reported on 08/06/2015    [provider]  naproxen (EC NAPROSYN) 500 MG EC tablet Take 1 tablet (500 mg total) by mouth 2 (two) times daily with a meal. 10/18/15   Menshew, Jenise V Bacon, PA-C  NICODERM CQ 7 MG/24HR patch Place 7 mg onto the skin daily.  04/28/16   [provider]  NYSTATIN powder  01/06/18   [provider]  ondansetron (ZOFRAN ODT) 4 MG disintegrating tablet Take 1 tablet (4 mg total) by mouth every 6 (six) hours as needed for nausea. 04/06/17   Hildred Laser, MD  OXYGEN Inhale 2 Units into the lungs.     [provider]  OXYQUINOLONE SULFATE VAGINAL (TRIMO-SAN) 0.025 % GEL Place 0.025 Applicatorfuls vaginally once a week. 12/26/17   Defrancesco, Prentice Docker, MD  polyethylene glycol powder (GLYCOLAX/MIRALAX) powder Take 17 g by mouth daily as needed.  11/06/15   [provider]  tiotropium (SPIRIVA) 18 MCG inhalation capsule Place 18 mcg into inhaler and inhale daily.     [provider]   traZODone (DESYREL) 100 MG tablet Take by mouth at bedtime as needed.     [provider]     Allergies Other; Penicillins; Sulfa antibiotics; Amoxicillin; and Morphine  Family History  Problem Relation Age of Onset  . Diabetes Other        2 Brother 2 Sister  . Hypertension Other   . Kidney disease Neg Hx   . Bladder Cancer Neg Hx   . Kidney cancer Neg Hx     Social History Social History   Tobacco Use  . Smoking status: Light Tobacco Smoker    Types: Cigarettes  . Smokeless tobacco: Never Used  Substance Use Topics  . Alcohol use: No    Alcohol/week: 0.0 standard drinks  . Drug use: No    Review of Systems  Constitutional: No fever/chills Eyes:  No visual changes.  ENT: No sore throat. Cardiovascular: As above Respiratory: As above Gastrointestinal: No abdominal pain.  No nausea, no vomiting.   Genitourinary: Negative for dysuria. Musculoskeletal: Negative for back pain. Skin: Negative for rash. Neurological: Negative for headaches    ____________________________________________   PHYSICAL EXAM:  VITAL SIGNS: ED Triage Vitals  Enc Vitals Group     BP 08/09/18 1100 128/61     Pulse --      Resp 08/09/18 1052 15     Temp 08/09/18 1052 98.4 F (36.9 C)     Temp Source 08/09/18 1052 Oral     SpO2 --      Weight 08/09/18 1055 111.1 kg (245 lb)     Height 08/09/18 1055 1.575 m (5\' 2" )     Head Circumference --      Peak Flow --      Pain Score 08/09/18 1054 9     Pain Loc --      Pain Edu? --      Excl. in GC? --     Constitutional: Alert and oriented.  Eyes: Conjunctivae are normal.   Nose: No congestion/rhinnorhea. Mouth/Throat: Mucous membranes are moist.    Cardiovascular: Normal rate, regular rhythm. Grossly normal heart sounds.  Good peripheral circulation.  No rash Respiratory: Normal respiratory effort.  No retractions.  Scattered mild wheezes Gastrointestinal: Soft and nontender. No distention. .  Musculoskeletal: No lower  extremity tenderness nor edema.  Warm and well perfused Neurologic:  Normal speech and language. No gross focal neurologic deficits are appreciated.  Skin:  Skin is warm, dry and intact. No rash noted. Psychiatric: Mood and affect are normal. Speech and behavior are normal.  ____________________________________________   LABS (all labs ordered are listed, but only abnormal results are displayed)  Labs Reviewed  BASIC METABOLIC PANEL - Abnormal; Notable for the following components:      Result Value   Glucose, Bld 169 (*)    All other components within normal limits  CBC - Abnormal; Notable for the following components:   RBC 3.83 (*)    Hemoglobin 11.1 (*)    All other components within normal limits  TROPONIN I  TROPONIN I   ____________________________________________  EKG  ED ECG REPORT I, Jene Every, the attending physician, personally viewed and interpreted this ECG.  Date: 08/09/2018  Rhythm: normal sinus rhythm QRS Axis: normal Intervals: normal ST/T Wave abnormalities: normal Narrative Interpretation: no evidence of acute ischemia  ____________________________________________  RADIOLOGY  Chest x-ray unremarkable CT angiography pending ____________________________________________   PROCEDURES  Procedure(s) performed: No  Procedures   Critical Care performed: No ____________________________________________   INITIAL IMPRESSION / ASSESSMENT AND PLAN / ED COURSE  Pertinent labs & imaging results that were available during my care of the patient were reviewed by me and considered in my medical decision making (see chart for details).  Patient presents with several weeks of cough and now chest discomfort today.  Differential includes COPD exacerbation, chest wall pain, pneumonia, COVID-19, less likely PE, less likely ACS.  Pending labs.  EKG is reassuring.  Chest x-ray and labs reassuring.  Patient treated with Toradol with little improvement.   Will send for CT angiography to rule out PE, second troponin ordered as well.    ____________________________________________   FINAL CLINICAL IMPRESSION(S) / ED DIAGNOSES  Final diagnoses:  Atypical chest pain        Note:  This document was prepared using Dragon voice recognition software and may include unintentional dictation  errors.   Jene EveryKinner, Brodric Schauer, MD 08/09/18 [phone]

## 2018-08-09 NOTE — ED Notes (Signed)
Pt cleared by MD to eat and drink. Pt given sandwich tray and ice water at this time 

## 2018-08-09 NOTE — ED Provider Notes (Signed)
----------------------------------------- 3:59 PM on 08/09/2018 -----------------------------------------   Blood pressure 128/61, temperature 98.4 F (36.9 C), temperature source Oral, resp. rate 20, height 5\' 2"  (1.575 m), weight 111.1 kg.  Assuming care from Dr. Cyril Loosen of Teresa [name] is a 58 y.o. female with a chief complaint of Chest Pain .    Please refer to H&P by previous MD for further details.  The current plan of care is to f/u results of CTA and repeat troponin.   CTA negative for PE or PNA, pericardial effusion, pulmonary edema. Repeat troponin negative.Patient's pain improved after toradol, most likely MSK.  Discussed with the patient incidental finding of thyroid mass and recommended follow-up with her primary care doctor for further evaluation.  Recommended taking Tylenol thousand milligrams 3 times a day for pain.  We will avoid NSAIDs at this time since patient has a history of diabetes.  Discussed return precautions for fever, shortness of breath, new or worsening chest pain.  Otherwise patient to follow-up with her primary care doctor.    I have personally reviewed the images performed during this visit and I agree with the Radiologist's read.   Interpretation by Radiologist:  Dg Chest 2 View  Result Date: 08/09/2018 CLINICAL DATA:  Cough, left-sided chest pain. EXAM: CHEST - 2 VIEW COMPARISON:  Chest x-ray dated May 18, 2018. FINDINGS: Stable cardiomegaly. Atherosclerotic calcification of the aortic arch. Normal pulmonary vascularity. Streaky linear subsegmental atelectasis at the right lung base. No focal consolidation, pleural effusion, or pneumothorax. No acute osseous abnormality. IMPRESSION: No active cardiopulmonary disease. Electronically Signed   By: Obie Dredge M.D.   On: 08/09/2018 11:32   Ct Angio Chest Pe W And/or Wo Contrast  Result Date: 08/09/2018 CLINICAL DATA:  Cough and shortness of breath EXAM: CT ANGIOGRAPHY CHEST WITH CONTRAST  TECHNIQUE: Multidetector CT imaging of the chest was performed using the standard protocol during bolus administration of intravenous contrast. Multiplanar CT image reconstructions and MIPs were obtained to evaluate the vascular anatomy. CONTRAST:  52mL OMNIPAQUE IOHEXOL 350 MG/ML SOLN COMPARISON:  Chest radiograph August 09, 2018 FINDINGS: Cardiovascular: There is no demonstrable pulmonary embolus. There is no thoracic aortic aneurysm or dissection. The visualized great vessels appear normal. There are foci of aortic atherosclerosis. There is no pericardial effusion or pericardial thickening. Heart is enlarged. There is prominence of the main pulmonary outflow tract, measuring 3.9 cm in diameter. Mediastinum/Nodes: Thyroid overall appears prominent. There are apparent mass lesions in the left lobe of thyroid with an apparent mass more inferiorly in the left lobe measuring approximately 2 x 1.5 cm. There is no appreciable thoracic adenopathy. No esophageal lesions are evident. Lungs/Pleura: There is patchy atelectatic change bilaterally, most notably in the lower lobe regions. There is no frank edema or consolidation. No appreciable pleural effusion or pleural thickening. Upper Abdomen: There is hepatic steatosis. Visualized upper abdominal structures otherwise appear unremarkable. Musculoskeletal: There is degenerative change in the thoracic spine. There are no blastic or lytic bone lesions. No evident chest wall lesions. Review of the MIP images confirms the above findings. IMPRESSION: 1. No demonstrable pulmonary embolus. No thoracic aortic aneurysm or dissection. There are foci of aortic atherosclerosis. 2. Prominence of the main pulmonary outflow tract measuring 3.9 cm in diameter. This is a finding indicative pulmonary arterial hypertension. 3. Areas of atelectatic change bilaterally. No edema or consolidation. 4.  No evident thoracic adenopathy. 5. **An incidental finding of potential clinical significance has  been found. Dominant mass arising from the left lobe of the  thyroid. Consider further evaluation with nonemergent thyroid ultrasound. If patient is clinically hyperthyroid, consider nuclear medicine thyroid uptake and scan.** 6.  Hepatic steatosis. Aortic Atherosclerosis (ICD10-I70.0). Electronically Signed   By: Bretta BangWilliam  Woodruff III M.D.   On: 08/09/2018 15:18           Teresa [name], [place], MD 08/09/18 [phone]

## 2018-08-09 NOTE — ED Notes (Signed)
Pt uses cane and walker at home, was told to call for assistance if she needs to get up.

## 2018-09-06 ENCOUNTER — Ambulatory Visit: Payer: Medicare Other | Admitting: Podiatry

## 2018-10-11 ENCOUNTER — Other Ambulatory Visit: Payer: Self-pay

## 2018-10-11 ENCOUNTER — Ambulatory Visit (INDEPENDENT_AMBULATORY_CARE_PROVIDER_SITE_OTHER): Payer: Medicare Other | Admitting: Podiatry

## 2018-10-11 ENCOUNTER — Encounter: Payer: Self-pay | Admitting: Podiatry

## 2018-10-11 DIAGNOSIS — M79674 Pain in right toe(s): Secondary | ICD-10-CM

## 2018-10-11 DIAGNOSIS — E1142 Type 2 diabetes mellitus with diabetic polyneuropathy: Secondary | ICD-10-CM | POA: Diagnosis not present

## 2018-10-11 DIAGNOSIS — M79675 Pain in left toe(s): Secondary | ICD-10-CM

## 2018-10-11 DIAGNOSIS — B351 Tinea unguium: Secondary | ICD-10-CM | POA: Diagnosis not present

## 2018-10-11 NOTE — Progress Notes (Signed)
Complaint:  Visit Type: Patient returns to my office for continued preventative foot care services. Complaint: Patient states" my nails have grown long and thick and become painful to walk and wear shoes" Patient has been diagnosed with DM with no foot complications. The patient presents for preventative foot care services. No changes to ROS.  Patient has not been seen for 9 months.  Podiatric Exam: Vascular: dorsalis pedis and posterior tibial pulses are palpable bilateral. Capillary return is immediate. Temperature gradient is WNL. Skin turgor WNL  Sensorium: Normal Semmes Weinstein monofilament test. Normal tactile sensation bilaterally. Nail Exam: Pt has thick disfigured discolored nails with subungual debris noted bilateral entire nail hallux through fifth toenails Ulcer Exam: There is no evidence of ulcer or pre-ulcerative changes or infection. Orthopedic Exam: Muscle tone and strength are WNL. No limitations in general ROM. No crepitus or effusions noted. Foot type and digits show no abnormalities. Bony prominences are unremarkable. Skin: No Porokeratosis. No infection or ulcers  Diagnosis:  Onychomycosis, , Pain in right toe, pain in left toes  Treatment & Plan Procedures and Treatment: Consent by patient was obtained for treatment procedures.   Debridement of mycotic and hypertrophic toenails, 1 through 5 bilateral and clearing of subungual debris. No ulceration, no infection noted.  Return Visit-Office Procedure: Patient instructed to return to the office for a follow up visit 3 months for continued evaluation and treatment.    Gardiner Barefoot DPM

## 2018-11-20 ENCOUNTER — Other Ambulatory Visit: Payer: Self-pay | Admitting: Gastroenterology

## 2018-11-20 DIAGNOSIS — R110 Nausea: Secondary | ICD-10-CM

## 2018-11-20 DIAGNOSIS — R197 Diarrhea, unspecified: Secondary | ICD-10-CM

## 2018-11-20 DIAGNOSIS — R634 Abnormal weight loss: Secondary | ICD-10-CM

## 2018-11-21 DIAGNOSIS — N3281 Overactive bladder: Secondary | ICD-10-CM | POA: Insufficient documentation

## 2018-11-27 ENCOUNTER — Ambulatory Visit: Payer: Medicare Other

## 2018-11-30 ENCOUNTER — Ambulatory Visit
Admission: RE | Admit: 2018-11-30 | Discharge: 2018-11-30 | Disposition: A | Discharge status: Home / Self Care | Payer: Medicare Other | Source: Ambulatory Visit | Attending: Gastroenterology | Admitting: Gastroenterology

## 2018-11-30 ENCOUNTER — Other Ambulatory Visit: Payer: Self-pay

## 2018-11-30 DIAGNOSIS — R634 Abnormal weight loss: Secondary | ICD-10-CM | POA: Insufficient documentation

## 2018-11-30 DIAGNOSIS — R110 Nausea: Secondary | ICD-10-CM | POA: Insufficient documentation

## 2018-11-30 DIAGNOSIS — R197 Diarrhea, unspecified: Secondary | ICD-10-CM | POA: Diagnosis not present

## 2018-11-30 LAB — POCT I-STAT CREATININE: Creatinine, Ser: 1 mg/dL (ref 0.44–1.00)

## 2018-11-30 IMAGING — CT CT ABDOMEN AND PELVIS WITH CONTRAST
2 of 5 series · 17 of 46 positions shown, 19 images · IV contrast (APPLIED)
Comparison: [DATE]

CLINICAL DATA: Abdominal cramping and diarrhea for 3 weeks.

EXAM:
CT ABDOMEN AND PELVIS WITH CONTRAST
TECHNIQUE: Multidetector CT imaging of the abdomen and pelvis was performed
using the standard protocol following bolus administration of
intravenous contrast.
CONTRAST:  100mL OMNIPAQUE IOHEXOL 300 MG/ML  SOLN

[Series 2: axial st · axial · 0.80mm/px · z∈[-770,-390]mm · 14 of 86 slices shown, 16 images]
[im 5/86  soft-tissue]
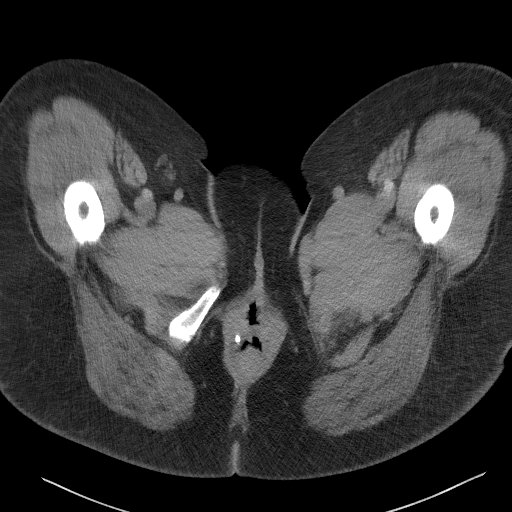
[im 5/86  bone]
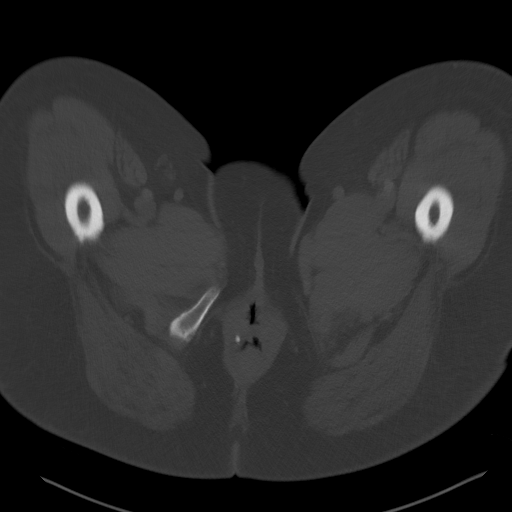
[im 13/86  soft-tissue]
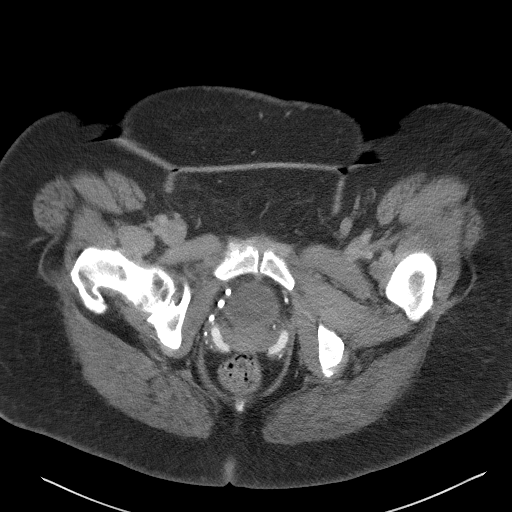
[im 18/86  soft-tissue]
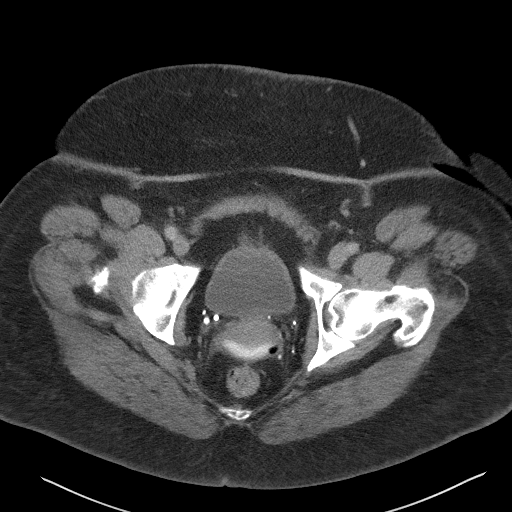
[im 22/86  soft-tissue]
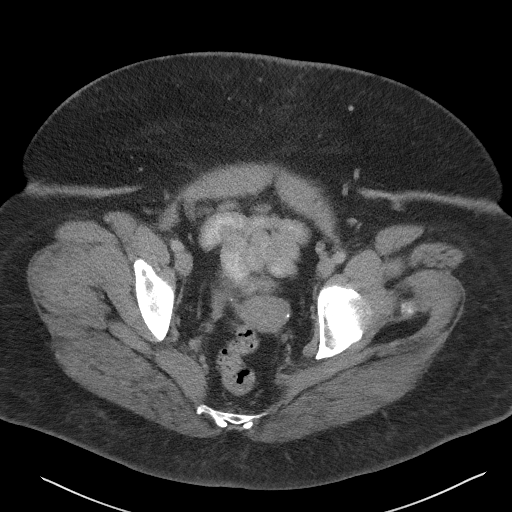
[im 30/86  soft-tissue]
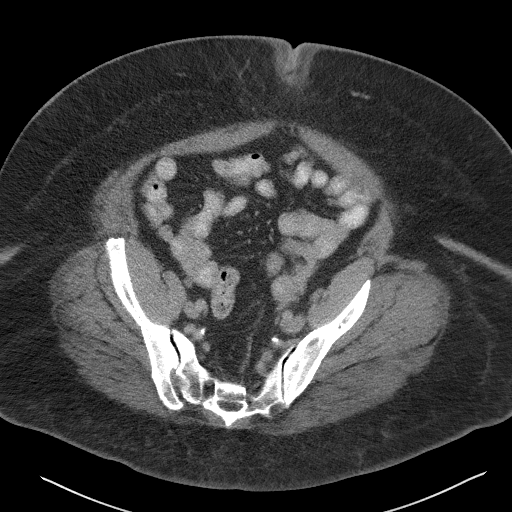
[im 35/86  soft-tissue]
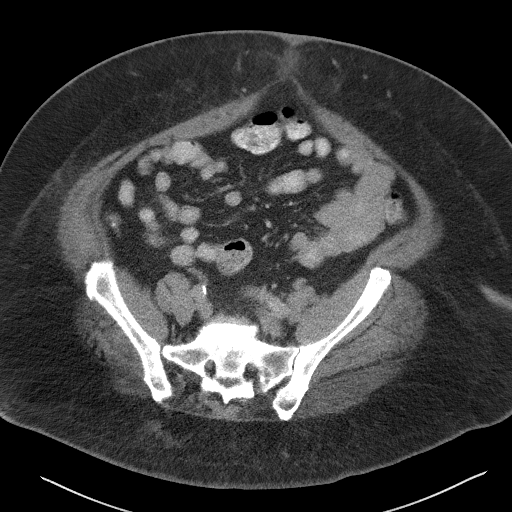
[im 39/86  soft-tissue]
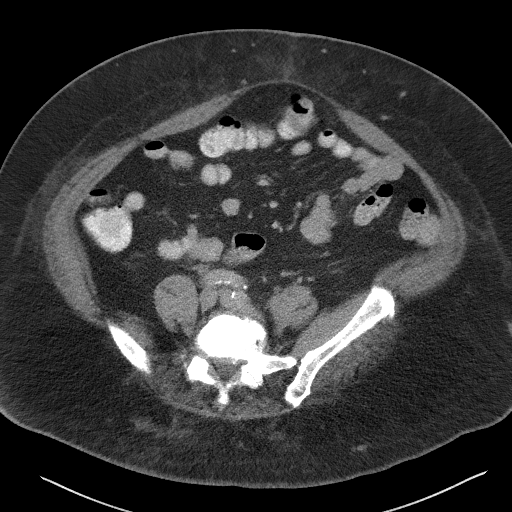
[im 47/86  soft-tissue]
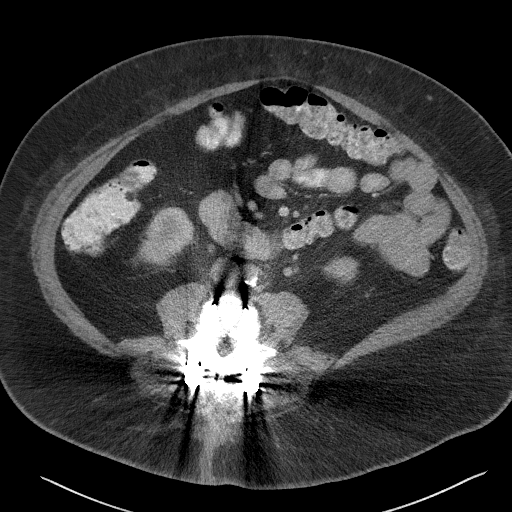
[im 52/86  soft-tissue]
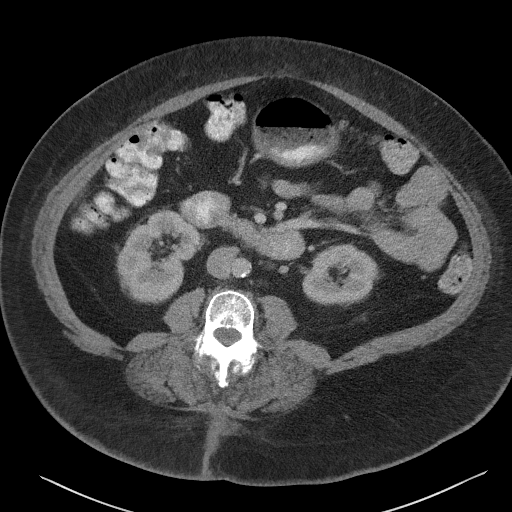
[im 52/86  bone]
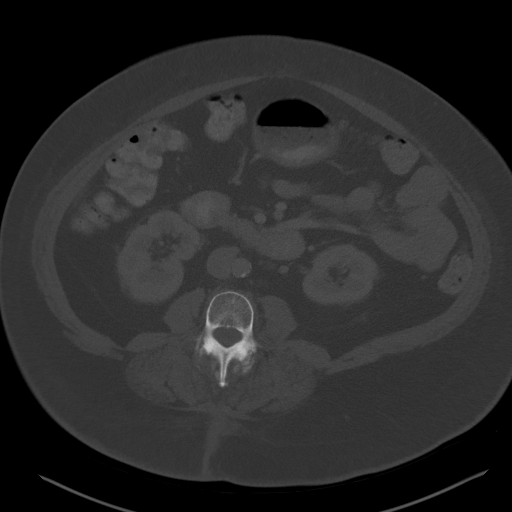
[im 56/86  soft-tissue]
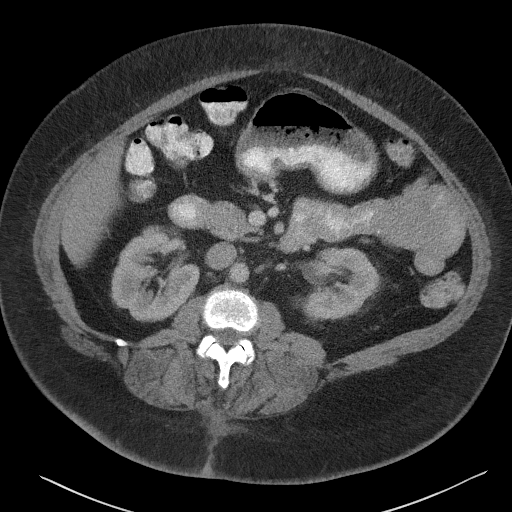
[im 64/86  soft-tissue]
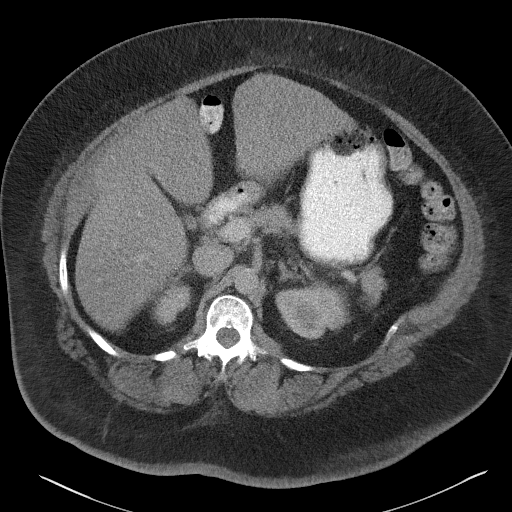
[im 69/86  soft-tissue]
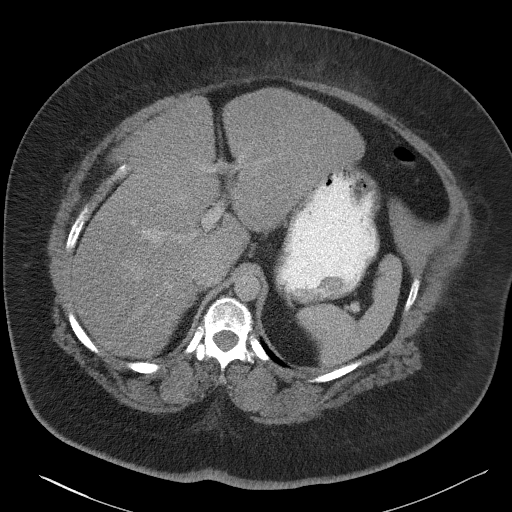
[im 73/86  soft-tissue]
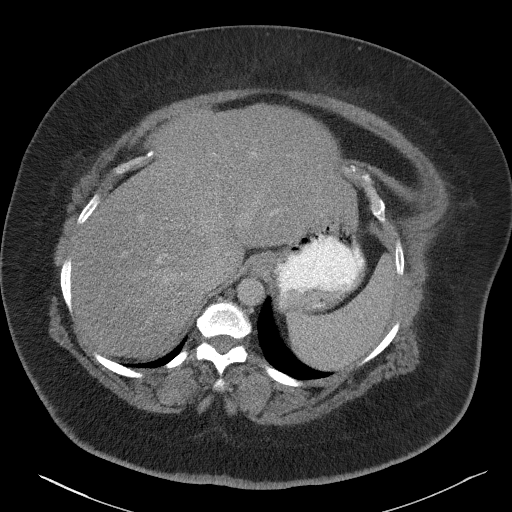
[im 81/86  soft-tissue]
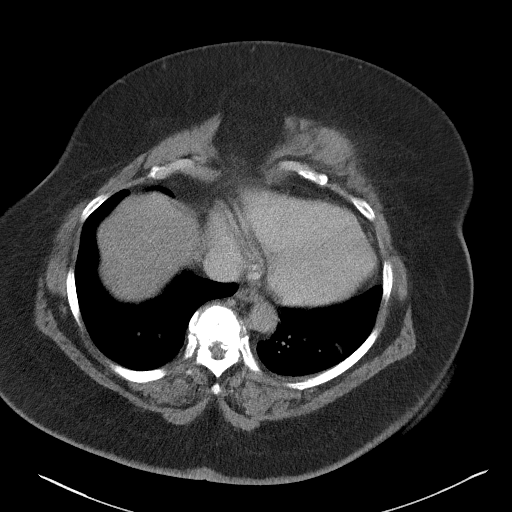

[Series 5: coronal st · coronal · 0.76mm/px · 3 of 115 slices shown]
[im 39/115  soft-tissue]
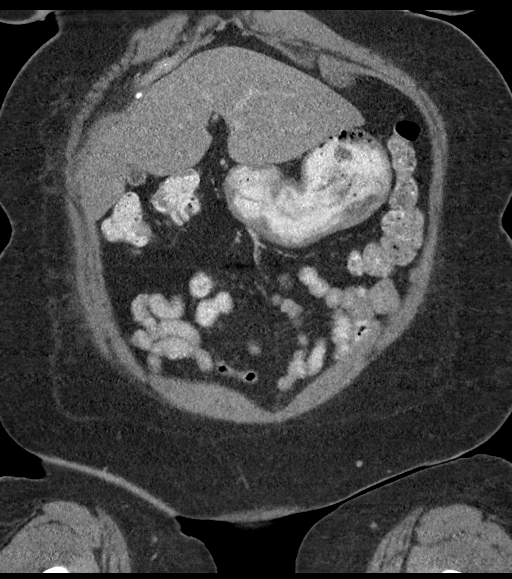
[im 51/115  soft-tissue]
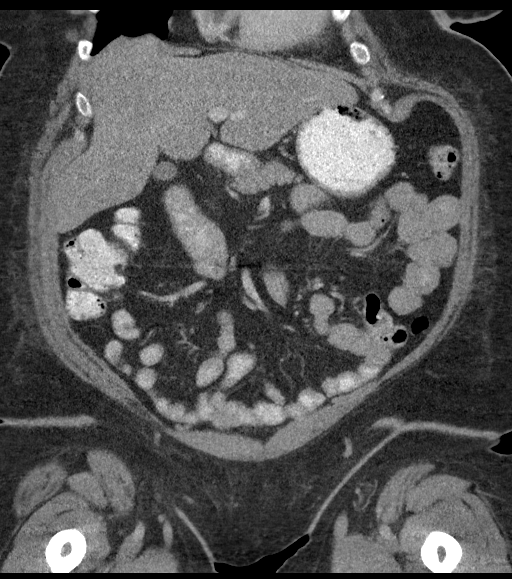
[im 64/115  soft-tissue]
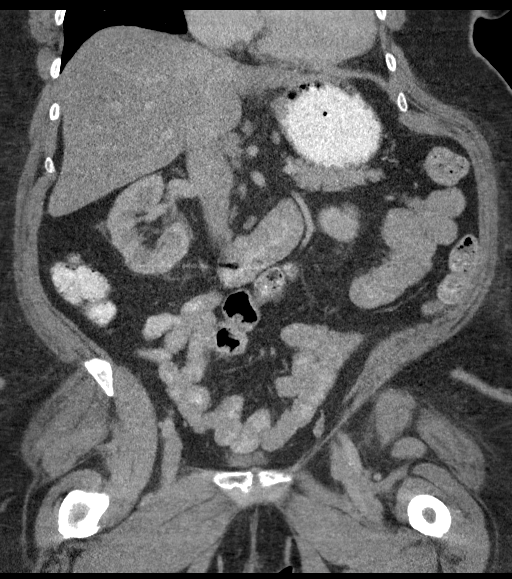

[17 of 46 positions shown; findings below may reference images not displayed]

FINDINGS: Lower Chest: No acute findings.

Hepatobiliary: No hepatic masses identified. Mild diffuse hepatic
steatosis. Gallbladder is unremarkable. No evidence of biliary
ductal dilatation.

Pancreas:  No mass or inflammatory changes.

Spleen: Within normal limits in size and appearance.

Adrenals/Urinary Tract: No masses identified. No evidence of
hydronephrosis. Unremarkable unopacified urinary bladder.

Stomach/Bowel: No evidence of obstruction, inflammatory process or
abnormal fluid collections.

Vascular/Lymphatic: No pathologically enlarged lymph nodes. No
abdominal aortic aneurysm. Aortic atherosclerosis.

Reproductive: No mass or other significant abnormality. Vaginal
pessary again seen in place.

Other: 2 small epigastric ventral abdominal wall hernias are seen
containing only fat.

Musculoskeletal:  No suspicious bone lesions identified.
IMPRESSION: 1. No acute findings within the abdomen or pelvis.
2. Mild hepatic steatosis.
3. Two small epigastric ventral abdominal wall hernias containing
only fat.

## 2018-11-30 MED ORDER — IOHEXOL 300 MG/ML  SOLN
100.0000 mL | Freq: Once | INTRAMUSCULAR | Status: AC | PRN
  Administered 2018-11-30: 100 mL via INTRAVENOUS

## 2018-12-02 ENCOUNTER — Other Ambulatory Visit: Payer: Self-pay

## 2018-12-02 ENCOUNTER — Encounter: Payer: Self-pay | Admitting: Emergency Medicine

## 2018-12-02 ENCOUNTER — Emergency Department
Admission: EM | Admit: 2018-12-02 | Discharge: 2018-12-02 | Disposition: A | Discharge status: Home / Self Care | Payer: Medicare Other | Attending: Emergency Medicine | Admitting: Emergency Medicine

## 2018-12-02 DIAGNOSIS — Z79899 Other long term (current) drug therapy: Secondary | ICD-10-CM | POA: Diagnosis not present

## 2018-12-02 DIAGNOSIS — I129 Hypertensive chronic kidney disease with stage 1 through stage 4 chronic kidney disease, or unspecified chronic kidney disease: Secondary | ICD-10-CM | POA: Diagnosis not present

## 2018-12-02 DIAGNOSIS — N189 Chronic kidney disease, unspecified: Secondary | ICD-10-CM | POA: Diagnosis not present

## 2018-12-02 DIAGNOSIS — R197 Diarrhea, unspecified: Secondary | ICD-10-CM | POA: Insufficient documentation

## 2018-12-02 DIAGNOSIS — Z794 Long term (current) use of insulin: Secondary | ICD-10-CM | POA: Insufficient documentation

## 2018-12-02 DIAGNOSIS — E1140 Type 2 diabetes mellitus with diabetic neuropathy, unspecified: Secondary | ICD-10-CM | POA: Insufficient documentation

## 2018-12-02 DIAGNOSIS — R112 Nausea with vomiting, unspecified: Secondary | ICD-10-CM

## 2018-12-02 DIAGNOSIS — R109 Unspecified abdominal pain: Secondary | ICD-10-CM | POA: Diagnosis not present

## 2018-12-02 DIAGNOSIS — E860 Dehydration: Secondary | ICD-10-CM

## 2018-12-02 DIAGNOSIS — Z8673 Personal history of transient ischemic attack (TIA), and cerebral infarction without residual deficits: Secondary | ICD-10-CM | POA: Diagnosis not present

## 2018-12-02 DIAGNOSIS — E1122 Type 2 diabetes mellitus with diabetic chronic kidney disease: Secondary | ICD-10-CM | POA: Diagnosis not present

## 2018-12-02 DIAGNOSIS — Z20828 Contact with and (suspected) exposure to other viral communicable diseases: Secondary | ICD-10-CM | POA: Diagnosis not present

## 2018-12-02 DIAGNOSIS — J449 Chronic obstructive pulmonary disease, unspecified: Secondary | ICD-10-CM | POA: Diagnosis not present

## 2018-12-02 DIAGNOSIS — Z7982 Long term (current) use of aspirin: Secondary | ICD-10-CM | POA: Insufficient documentation

## 2018-12-02 DIAGNOSIS — F17210 Nicotine dependence, cigarettes, uncomplicated: Secondary | ICD-10-CM | POA: Diagnosis not present

## 2018-12-02 LAB — CBC
HCT: 37.9 % (ref 36.0–46.0)
Hemoglobin: 11.9 g/dL — ABNORMAL LOW (ref 12.0–15.0)
MCH: 28.5 pg (ref 26.0–34.0)
MCHC: 31.4 g/dL (ref 30.0–36.0)
MCV: 90.9 fL (ref 80.0–100.0)
Platelets: 329 10*3/uL (ref 150–400)
RBC: 4.17 MIL/uL (ref 3.87–5.11)
RDW: 13.5 % (ref 11.5–15.5)
WBC: 9.4 10*3/uL (ref 4.0–10.5)
nRBC: 0 % (ref 0.0–0.2)

## 2018-12-02 LAB — COMPREHENSIVE METABOLIC PANEL
ALT: 15 U/L (ref 0–44)
AST: 18 U/L (ref 15–41)
Albumin: 4 g/dL (ref 3.5–5.0)
Alkaline Phosphatase: 75 U/L (ref 38–126)
Anion gap: 12 (ref 5–15)
BUN: 11 mg/dL (ref 6–20)
CO2: 25 mmol/L (ref 22–32)
Calcium: 9.4 mg/dL (ref 8.9–10.3)
Chloride: 104 mmol/L (ref 98–111)
Creatinine, Ser: 0.95 mg/dL (ref 0.44–1.00)
GFR calc Af Amer: >60 mL/min (ref >60)
GFR calc non Af Amer: >60 mL/min (ref >60)
Glucose, Bld: 148 mg/dL — ABNORMAL HIGH (ref 70–99)
Potassium: 3.4 mmol/L — ABNORMAL LOW (ref 3.5–5.1)
Sodium: 141 mmol/L (ref 135–145)
Total Bilirubin: 0.5 mg/dL (ref 0.3–1.2)
Total Protein: 7.3 g/dL (ref 6.5–8.1)

## 2018-12-02 LAB — URINALYSIS, COMPLETE (UACMP) WITH MICROSCOPIC
Bilirubin Urine: NEGATIVE
Glucose, UA: NEGATIVE mg/dL
Hgb urine dipstick: NEGATIVE
Ketones, ur: NEGATIVE mg/dL
Leukocytes,Ua: NEGATIVE
Nitrite: NEGATIVE
Protein, ur: 100 mg/dL — AB
Specific Gravity, Urine: 1.018 (ref 1.005–1.030)
pH: 5 (ref 5.0–8.0)

## 2018-12-02 LAB — LIPASE, BLOOD: Lipase: 37 U/L (ref 11–51)

## 2018-12-02 MED ORDER — DIPHENOXYLATE-ATROPINE 2.5-0.025 MG PO TABS: 1.0000 | ORAL_TABLET | Freq: Four times a day (QID) | ORAL | 0 refills | Status: AC | PRN

## 2018-12-02 MED ORDER — METOCLOPRAMIDE HCL 5 MG/ML IJ SOLN
10.0000 mg | Freq: Once | INTRAMUSCULAR | Status: AC
  Administered 2018-12-02: 10 mg via INTRAVENOUS
  Filled 2018-12-02: qty 2

## 2018-12-02 MED ORDER — LACTATED RINGERS IV BOLUS
1000.0000 mL | Freq: Once | INTRAVENOUS | Status: AC
  Administered 2018-12-02: 1000 mL via INTRAVENOUS

## 2018-12-02 MED ORDER — DIPHENHYDRAMINE HCL 50 MG/ML IJ SOLN
25.0000 mg | Freq: Once | INTRAMUSCULAR | Status: AC
  Administered 2018-12-02: 19:00:00 25 mg via INTRAVENOUS
  Filled 2018-12-02: qty 1

## 2018-12-02 MED ORDER — ONDANSETRON 4 MG PO TBDP: 4.0000 mg | ORAL_TABLET | Freq: Three times a day (TID) | ORAL | 0 refills | Status: DC | PRN

## 2018-12-02 MED ORDER — DIPHENOXYLATE-ATROPINE 2.5-0.025 MG PO TABS
2.0000 | ORAL_TABLET | Freq: Once | ORAL | Status: AC
  Administered 2018-12-02: 2 via ORAL
  Filled 2018-12-02: qty 2

## 2018-12-02 NOTE — ED Notes (Signed)
After triage completed, pt stated she was supposed to be on oxygen but her tank runs out, pt wears 2L [place] continuously, pt provided with hospital tank while she waits.

## 2018-12-02 NOTE — ED Notes (Signed)
PT unable to void at this time, pt voided on arrival in lobby bathroom.

## 2018-12-02 NOTE — ED Notes (Signed)
Pt had a ct on Friday and is c/o of having nausea and a small amount of diarrhea since. Pt is nad.

## 2018-12-02 NOTE — ED Triage Notes (Signed)
Pt arrived via POV with reports of having CT scan on Friday, c/o diarrhea, nausea abdominal pain as well as "just not feeling good" since having CT on Friday.  PT had CT because of stomach issues.  Pt also had flu shot 2 weeks ago. Pt ambulatory in triage without difficulty.

## 2018-12-02 NOTE — ED Provider Notes (Signed)
[facility] Emergency Department Provider Note  ____________________________________________   First MD Initiated Contact with Patient 12/02/18 1642     (approximate)  I have reviewed the triage vital signs and the nursing notes.   HISTORY  Chief Complaint Abdominal Pain and Emesis    HPI Teresa [name] is a 58 y.o. female  Here with n/v/d. Pt reports that over the past several weeks, she's had intermittent episodes of nausea, vomiting, and diarrhea. She was just referred to GI for this and set up with an outpt CT, which she had. She states that since drinking the contrast for the scan, she's had persistent nausea, intermittnet abd cramping, and diarrhea. No fevers. No persistent pain. Of note, her sx did seem to start after taking ABX for a resp infection. However, C. Diff and stool studies were just negative at her GI appt. Pain is intermittent, diffuse, and cramp like. No alleviating factors. No urinary sx.        Past Medical History:  Diagnosis Date  . Anxiety disorder   . Arthritis   . Asthma   . Bilateral leg edema   . CAD (coronary artery disease)   . Chest pain   . Chronic back pain   . Chronic kidney disease   . COPD (chronic obstructive pulmonary disease) (HCC)   . DDD (degenerative disc disease), lumbar   . Depression   . Diabetes (HCC)   . Diabetic retinopathy (HCC)   . Genital herpes   . Heart burn   . Heart disease   . Heart murmur   . Hepatitis   . HLD (hyperlipidemia)   . Hypertension   . Impetigo   . Microscopic hematuria   . Obesity, morbid (HCC)   . Pancreatitis   . Peripheral neuropathy   . Sleep apnea   . Thyroid disease   . Urinary frequency   . Urinary incontinence   . Ventricular septal defect     Patient Active Problem List   Diagnosis Date Noted  . Pain due to onychomycosis of toenails of both feet 10/11/2018  . Morbid obesity (HCC) 10/08/2015  . Cystocele, midline 10/08/2015  . SUI (stress urinary  incontinence, female) 10/08/2015  . Menopause 10/08/2015  . Urinary frequency 08/06/2015  . Microscopic hematuria 08/06/2015  . Cystocele, grade 2 08/06/2015  . Morbid (severe) obesity due to excess calories (HCC) 05/15/2015  . TIA (transient ischemic attack) 01/01/2015  . Anterior chest wall pain 09/08/2014  . Nicotine addiction 08/19/2014  . Benign essential HTN 08/04/2014  . Combined fat and carbohydrate induced hyperlipemia 02/12/2014  . 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 Disease of lung 09/06/2013  . Heart valve disease 09/06/2013  . Chronic obstructive pulmonary disease (HCC) 02/19/2013  . Dependence on supplemental oxygen 02/19/2013  . Pulmonary hypertension (HCC) 02/19/2013  . Postprocedural state 02/19/2013  . Other specified postprocedural states 02/19/2013  . 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 Chronic pain 07/23/2012  . Diabetes mellitus (HCC) 07/23/2012  . Clinical depression 09/02/2009  . Major depressive disorder with single episode 09/02/2009  . Essential (primary) hypertension 06/01/2001    Past Surgical History:  Procedure Laterality Date  . CARDIAC SURGERY    . CYST EXCISION    . LUMBAR DISC SURGERY    . WRIST SURGERY Bilateral     Prior to Admission medications   Medication Sig Start Date End Date Taking? Authorizing Provider  acyclovir (ZOVIRAX) 400 MG tablet Take 400 mg by mouth 2 (two) times daily.  [provider]  albuterol (PROVENTIL HFA;VENTOLIN HFA) 108 (90 BASE) MCG/ACT inhaler Inhale 1-2 puffs into the lungs every 6 (six) hours as needed for wheezing or shortness of breath.    [provider]  ALPRAZolam Duanne Moron) 1 MG tablet Take 1 mg by mouth 3 (three) times daily as needed for anxiety. 3 to 4 times daily PRN    [provider]  amLODipine (NORVASC) 5 MG tablet Take 5 mg by mouth daily.    [provider]  aspirin 81 MG tablet Take 1 tablet (81 mg total) by mouth daily. 01/02/15   Vaughan Basta, MD  atorvastatin (LIPITOR) 20 MG tablet Take 20 mg by mouth at bedtime. 08/31/16   [provider]  azithromycin (ZITHROMAX) 250 MG tablet Take 1 a day for 4 days 05/18/18   Alfred Levins, [place], MD  budesonide-formoterol [facility]) 160-4.5 MCG/ACT inhaler Inhale 2 puffs into the lungs 2 (two) times daily.  11/10/15   [provider]  diphenoxylate-atropine (LOMOTIL) 2.5-0.025 MG tablet Take 1 tablet by mouth 4 (four) times daily as needed for up to 3 days for diarrhea or loose stools. 12/02/18 12/05/18  Duffy Bruce, MD  DULoxetine (CYMBALTA) 30 MG capsule Take by mouth. 12/22/17 12/22/18  [provider]  enalapril (VASOTEC) 5 MG tablet Take 5 mg by mouth daily.    [provider]  fluticasone (FLONASE) 50 MCG/ACT nasal spray Place 1 spray into both nostrils daily as needed.  06/04/15   [provider]  furosemide (LASIX) 40 MG tablet Take 40 mg by mouth daily.    [provider]  gabapentin (NEURONTIN) 300 MG capsule Take 300 mg by mouth 3 (three) times daily.  08/25/15   [provider]  insulin lispro (HUMALOG) 100 UNIT/ML injection Inject 4-10 Units into the skin 3 (three) times daily with meals. Sliding scale    [provider]  LANTUS SOLOSTAR 100 UNIT/ML Solostar Pen  01/06/18   [provider]  metoprolol tartrate (LOPRESSOR) 25 MG tablet Take 25 mg by mouth 2 (two) times daily. Reported on 08/06/2015    [provider]  naproxen (EC NAPROSYN) 500 MG EC tablet Take 1 tablet (500 mg total) by mouth 2 (two) times daily with a meal. 10/18/15   Menshew, Jenise V Bacon, PA-C  NICODERM CQ 7 MG/24HR patch Place 7 mg onto the skin daily.  04/28/16   [provider]  NYSTATIN powder  01/06/18   [provider]  ondansetron (ZOFRAN ODT) 4 MG disintegrating tablet Take 1 tablet (4 mg total) by mouth every 8 (eight) hours as needed for nausea or vomiting. 12/02/18   Duffy Bruce, MD  OXYGEN  Inhale 2 Units into the lungs.     [provider]  OXYQUINOLONE SULFATE VAGINAL (TRIMO-SAN) 0.025 % GEL Place 2.595 Applicatorfuls vaginally once a week. 12/26/17   Defrancesco, Alanda Slim, MD  polyethylene glycol powder (GLYCOLAX/MIRALAX) powder Take 17 g by mouth daily as needed.  11/06/15   [provider]  tiotropium (SPIRIVA) 18 MCG inhalation capsule Place 18 mcg into inhaler and inhale daily.     [provider]  traZODone (DESYREL) 100 MG tablet Take by mouth at bedtime as needed.     [provider]    Allergies Other, Penicillins, Sulfa antibiotics, Amoxicillin, Duloxetine, and Morphine  Family History  Problem Relation Age of Onset  . Diabetes Other        2 Brother 2 Sister  . Hypertension Other   .  Kidney disease Neg Hx   . Bladder Cancer Neg Hx   . Kidney cancer Neg Hx     Social History Social History   Tobacco Use  . Smoking status: Light Tobacco Smoker    Types: Cigarettes  . Smokeless tobacco: Never Used  Substance Use Topics  . Alcohol use: No    Alcohol/week: 0.0 standard drinks  . Drug use: No    Review of Systems  Review of Systems  Constitutional: Positive for fatigue. Negative for fever.  HENT: Negative for congestion and sore throat.   Eyes: Negative for visual disturbance.  Respiratory: Negative for cough and shortness of breath.   Cardiovascular: Negative for chest pain.  Gastrointestinal: Positive for abdominal pain, diarrhea, nausea and vomiting.  Genitourinary: Negative for flank pain.  Musculoskeletal: Negative for back pain and neck pain.  Skin: Negative for rash and wound.  Neurological: Negative for weakness.  All other systems reviewed and are negative.    ____________________________________________  PHYSICAL EXAM:      VITAL SIGNS: ED Triage Vitals  Enc Vitals Group     BP 12/02/18 1437 (!) 175/96     Pulse Rate 12/02/18 1437 97     Resp 12/02/18 1437 20     Temp 12/02/18 1437 100.1 F  (37.8 C)     Temp Source 12/02/18 1437 Oral     SpO2 12/02/18 1437 96 %     Weight 12/02/18 1438 240 lb (108.9 kg)     Height 12/02/18 1438 5\' 2"  (1.575 m)     Head Circumference --      Peak Flow --      Pain Score --      Pain Loc --      Pain Edu? --      Excl. in GC? --      Physical Exam Vitals signs and nursing note reviewed.  Constitutional:      General: She is not in acute distress.    Appearance: She is well-developed.  HENT:     Head: Normocephalic and atraumatic.  Eyes:     Conjunctiva/sclera: Conjunctivae normal.  Neck:     Musculoskeletal: Neck supple.  Cardiovascular:     Rate and Rhythm: Normal rate and regular rhythm.     Heart sounds: Normal heart sounds. No murmur. No friction rub.  Pulmonary:     Effort: Pulmonary effort is normal. No respiratory distress.     Breath sounds: Normal breath sounds. No wheezing or rales.  Abdominal:     General: Bowel sounds are increased. There is no distension.     Palpations: Abdomen is soft.     Tenderness: There is generalized abdominal tenderness (mild).  Skin:    General: Skin is warm.     Capillary Refill: Capillary refill takes less than 2 seconds.  Neurological:     Mental Status: She is alert and oriented to person, place, and time.     Motor: No abnormal muscle tone.       ____________________________________________   LABS (all labs ordered are listed, but only abnormal results are displayed)  Labs Reviewed  COMPREHENSIVE METABOLIC PANEL - Abnormal; Notable for the following components:      Result Value   Potassium 3.4 (*)    Glucose, Bld 148 (*)    All other components within normal limits  CBC - Abnormal; Notable for the following components:   Hemoglobin 11.9 (*)    All other components within normal limits  URINALYSIS, COMPLETE (UACMP)  WITH MICROSCOPIC - Abnormal; Notable for the following components:   Color, Urine YELLOW (*)    APPearance CLEAR (*)    Protein, ur 100 (*)    Bacteria,  UA RARE (*)    All other components within normal limits  SARS CORONAVIRUS 2  LIPASE, BLOOD    ____________________________________________  EKG: None ________________________________________  RADIOLOGY All imaging, including plain films, CT scans, and ultrasounds, independently reviewed by me, and interpretations confirmed via formal radiology reads.  ED MD interpretation:   CT reviewed from outpt - no acute findings.  Official radiology report(s): No results found.  ____________________________________________  PROCEDURES   Procedure(s) performed (including Critical Care):  Procedures  ____________________________________________  INITIAL IMPRESSION / MDM / ASSESSMENT AND PLAN / ED COURSE  As part of my medical decision making, I reviewed the following data within the electronic MEDICAL RECORD NUMBER Notes from prior ED visits and [place] Controlled Substance Database      *Teresa HarvestValerie A Bevan was evaluated in Emergency Department on 12/03/2018 for the symptoms described in the history of present illness. She was evaluated in the context of the global COVID-19 pandemic, which necessitated consideration that the patient might be at risk for infection with the SARS-CoV-2 virus that causes COVID-19. Institutional protocols and algorithms that pertain to the evaluation of patients at risk for COVID-19 are in a state of rapid change based on information released by regulatory bodies including the CDC and federal and state organizations. These policies and algorithms were followed during the patient's care in the ED.  Some ED evaluations and interventions may be delayed as a result of limited staffing during the pandemic.*      Medical Decision Making: 58 yo F here with mild generalized weakness, n/v/d. This seems to be an ongoing, chronic issue per pt report and records, made worse by drinking iodinated contrast for recent CT scan. CT scan reviewed and is unremarkable. Her abd is soft, nT,  ND. Lab work is very reassuring. UA without UTI. No signs of AKI, hepatitis, or other acute abnormality. Feels improved after fluids and sx control. Reviewed recent stool studies as well which are unremarkable/negative. Of note, this did start after taking ABX. Though C. Diff neg, will advise her to take probiotics, rx symptomatic control, and f/u with PCP.  ____________________________________________  FINAL CLINICAL IMPRESSION(S) / ED DIAGNOSES  Final diagnoses:  Dehydration  Nausea vomiting and diarrhea     MEDICATIONS GIVEN DURING THIS VISIT:  Medications  lactated ringers bolus 1,000 mL (0 mLs Intravenous Stopped 12/02/18 2058)  metoCLOPramide (REGLAN) injection 10 mg (10 mg Intravenous Given 12/02/18 1843)  diphenhydrAMINE (BENADRYL) injection 25 mg (25 mg Intravenous Given 12/02/18 1841)  diphenoxylate-atropine (LOMOTIL) 2.5-0.025 MG per tablet 2 tablet (2 tablets Oral Given 12/02/18 1844)     ED Discharge Orders         Ordered    ondansetron (ZOFRAN ODT) 4 MG disintegrating tablet  Every 8 hours PRN     12/02/18 2050    diphenoxylate-atropine (LOMOTIL) 2.5-0.025 MG tablet  4 times daily PRN     12/02/18 2050           Note:  This document was prepared using Dragon voice recognition software and may include unintentional dictation errors.   Shaune PollackIsaacs, Elle Vezina, MD 12/03/18 [phone]

## 2018-12-03 LAB — SARS CORONAVIRUS 2 (TAT 6-24 HRS): SARS Coronavirus 2: NEGATIVE

## 2019-01-10 ENCOUNTER — Encounter: Payer: Self-pay | Admitting: Podiatry

## 2019-01-10 ENCOUNTER — Other Ambulatory Visit: Payer: Self-pay

## 2019-01-10 ENCOUNTER — Ambulatory Visit (INDEPENDENT_AMBULATORY_CARE_PROVIDER_SITE_OTHER): Payer: Medicare Other | Admitting: Podiatry

## 2019-01-10 DIAGNOSIS — E1142 Type 2 diabetes mellitus with diabetic polyneuropathy: Secondary | ICD-10-CM | POA: Diagnosis not present

## 2019-01-10 DIAGNOSIS — M79674 Pain in right toe(s): Secondary | ICD-10-CM

## 2019-01-10 DIAGNOSIS — B351 Tinea unguium: Secondary | ICD-10-CM

## 2019-01-10 DIAGNOSIS — M79675 Pain in left toe(s): Secondary | ICD-10-CM

## 2019-01-10 NOTE — Progress Notes (Signed)
Complaint:  Visit Type: Patient returns to my office for continued preventative foot care services. Complaint: Patient states" my nails have grown long and thick and become painful to walk and wear shoes" Patient has been diagnosed with DM with no foot complications. The patient presents for preventative foot care services. No changes to ROS.  Patient has not been seen for 9 months.  Podiatric Exam: Vascular: dorsalis pedis and posterior tibial pulses are palpable bilateral. Capillary return is immediate. Temperature gradient is WNL. Skin turgor WNL  Sensorium: Normal Semmes Weinstein monofilament test. Normal tactile sensation bilaterally. Nail Exam: Pt has thick disfigured discolored nails with subungual debris noted bilateral entire nail hallux through fifth toenails Ulcer Exam: There is no evidence of ulcer or pre-ulcerative changes or infection. Orthopedic Exam: Muscle tone and strength are WNL. No limitations in general ROM. No crepitus or effusions noted. Foot type and digits show no abnormalities. Bony prominences are unremarkable. Skin: No Porokeratosis. No infection or ulcers  Diagnosis:  Onychomycosis, , Pain in right toe, pain in left toes  Treatment & Plan Procedures and Treatment: Consent by patient was obtained for treatment procedures.   Debridement of mycotic and hypertrophic toenails, 1 through 5 bilateral and clearing of subungual debris. No ulceration, no infection noted.  Return Visit-Office Procedure: Patient instructed to return to the office for a follow up visit 3 months for continued evaluation and treatment.    Gardiner Barefoot DPM

## 2019-04-18 ENCOUNTER — Ambulatory Visit: Payer: Medicare Other | Admitting: Podiatry

## 2019-05-29 ENCOUNTER — Ambulatory Visit: Admit: 2019-05-29 | Payer: Medicare Other | Admitting: Internal Medicine

## 2019-05-29 SURGERY — ESOPHAGOGASTRODUODENOSCOPY (EGD) WITH PROPOFOL
Anesthesia: General

## 2019-07-15 ENCOUNTER — Ambulatory Visit (INDEPENDENT_AMBULATORY_CARE_PROVIDER_SITE_OTHER): Payer: Medicare Other | Admitting: Podiatry

## 2019-07-15 ENCOUNTER — Other Ambulatory Visit: Payer: Self-pay

## 2019-07-15 ENCOUNTER — Encounter: Payer: Self-pay | Admitting: Podiatry

## 2019-07-15 VITALS — Temp 98.1°F

## 2019-07-15 DIAGNOSIS — E1142 Type 2 diabetes mellitus with diabetic polyneuropathy: Secondary | ICD-10-CM

## 2019-07-15 DIAGNOSIS — M79674 Pain in right toe(s): Secondary | ICD-10-CM

## 2019-07-15 DIAGNOSIS — M79675 Pain in left toe(s): Secondary | ICD-10-CM

## 2019-07-15 DIAGNOSIS — B351 Tinea unguium: Secondary | ICD-10-CM

## 2019-07-15 NOTE — Progress Notes (Signed)

## 2019-07-22 DIAGNOSIS — R0602 Shortness of breath: Secondary | ICD-10-CM | POA: Insufficient documentation

## 2019-09-25 DIAGNOSIS — L750 Bromhidrosis: Secondary | ICD-10-CM | POA: Insufficient documentation

## 2019-10-17 ENCOUNTER — Ambulatory Visit: Payer: Medicare Other | Admitting: Podiatry

## 2019-10-24 ENCOUNTER — Ambulatory Visit (INDEPENDENT_AMBULATORY_CARE_PROVIDER_SITE_OTHER): Payer: Medicare Other | Admitting: Podiatry

## 2019-10-24 ENCOUNTER — Encounter: Payer: Self-pay | Admitting: Podiatry

## 2019-10-24 ENCOUNTER — Other Ambulatory Visit: Payer: Self-pay

## 2019-10-24 DIAGNOSIS — M79674 Pain in right toe(s): Secondary | ICD-10-CM | POA: Diagnosis not present

## 2019-10-24 DIAGNOSIS — M79675 Pain in left toe(s): Secondary | ICD-10-CM | POA: Diagnosis not present

## 2019-10-24 DIAGNOSIS — B351 Tinea unguium: Secondary | ICD-10-CM | POA: Diagnosis not present

## 2019-10-24 DIAGNOSIS — E1142 Type 2 diabetes mellitus with diabetic polyneuropathy: Secondary | ICD-10-CM

## 2019-10-24 NOTE — Progress Notes (Signed)

## 2020-02-06 ENCOUNTER — Ambulatory Visit: Payer: Medicare Other | Admitting: Podiatry

## 2020-03-11 ENCOUNTER — Emergency Department
Admission: EM | Admit: 2020-03-11 | Discharge: 2020-03-11 | Disposition: A | Discharge status: Home / Self Care | Payer: Medicare Other | Attending: Emergency Medicine | Admitting: Emergency Medicine

## 2020-03-11 ENCOUNTER — Emergency Department: Payer: Medicare Other

## 2020-03-11 ENCOUNTER — Other Ambulatory Visit: Payer: Self-pay

## 2020-03-11 ENCOUNTER — Encounter: Payer: Self-pay | Admitting: Emergency Medicine

## 2020-03-11 DIAGNOSIS — Z8673 Personal history of transient ischemic attack (TIA), and cerebral infarction without residual deficits: Secondary | ICD-10-CM | POA: Diagnosis not present

## 2020-03-11 DIAGNOSIS — F17210 Nicotine dependence, cigarettes, uncomplicated: Secondary | ICD-10-CM | POA: Diagnosis not present

## 2020-03-11 DIAGNOSIS — Z79899 Other long term (current) drug therapy: Secondary | ICD-10-CM | POA: Insufficient documentation

## 2020-03-11 DIAGNOSIS — Z7951 Long term (current) use of inhaled steroids: Secondary | ICD-10-CM | POA: Insufficient documentation

## 2020-03-11 DIAGNOSIS — I2510 Atherosclerotic heart disease of native coronary artery without angina pectoris: Secondary | ICD-10-CM | POA: Diagnosis not present

## 2020-03-11 DIAGNOSIS — E1122 Type 2 diabetes mellitus with diabetic chronic kidney disease: Secondary | ICD-10-CM | POA: Insufficient documentation

## 2020-03-11 DIAGNOSIS — Z20822 Contact with and (suspected) exposure to covid-19: Secondary | ICD-10-CM | POA: Diagnosis not present

## 2020-03-11 DIAGNOSIS — Z794 Long term (current) use of insulin: Secondary | ICD-10-CM | POA: Diagnosis not present

## 2020-03-11 DIAGNOSIS — J069 Acute upper respiratory infection, unspecified: Secondary | ICD-10-CM | POA: Diagnosis not present

## 2020-03-11 DIAGNOSIS — I129 Hypertensive chronic kidney disease with stage 1 through stage 4 chronic kidney disease, or unspecified chronic kidney disease: Secondary | ICD-10-CM | POA: Diagnosis not present

## 2020-03-11 DIAGNOSIS — N189 Chronic kidney disease, unspecified: Secondary | ICD-10-CM | POA: Diagnosis not present

## 2020-03-11 DIAGNOSIS — J449 Chronic obstructive pulmonary disease, unspecified: Secondary | ICD-10-CM | POA: Insufficient documentation

## 2020-03-11 DIAGNOSIS — Z7982 Long term (current) use of aspirin: Secondary | ICD-10-CM | POA: Diagnosis not present

## 2020-03-11 DIAGNOSIS — R059 Cough, unspecified: Secondary | ICD-10-CM | POA: Diagnosis present

## 2020-03-11 HISTORY — DX: Dependence on supplemental oxygen: Z99.81

## 2020-03-11 LAB — RESP PANEL BY RT-PCR (FLU A&B, COVID) ARPGX2
Influenza A by PCR: NEGATIVE
Influenza B by PCR: NEGATIVE
SARS Coronavirus 2 by RT PCR: NEGATIVE

## 2020-03-11 IMAGING — CR DG CHEST 2V
2 series · 2 of 2 positions shown · non-contrast
Comparison: [DATE] chest radiograph and CT angiogram chest

CLINICAL DATA: Shortness of breath with cough, chest pain, and
fever

EXAM:
CHEST - 2 VIEW

[chest pa]
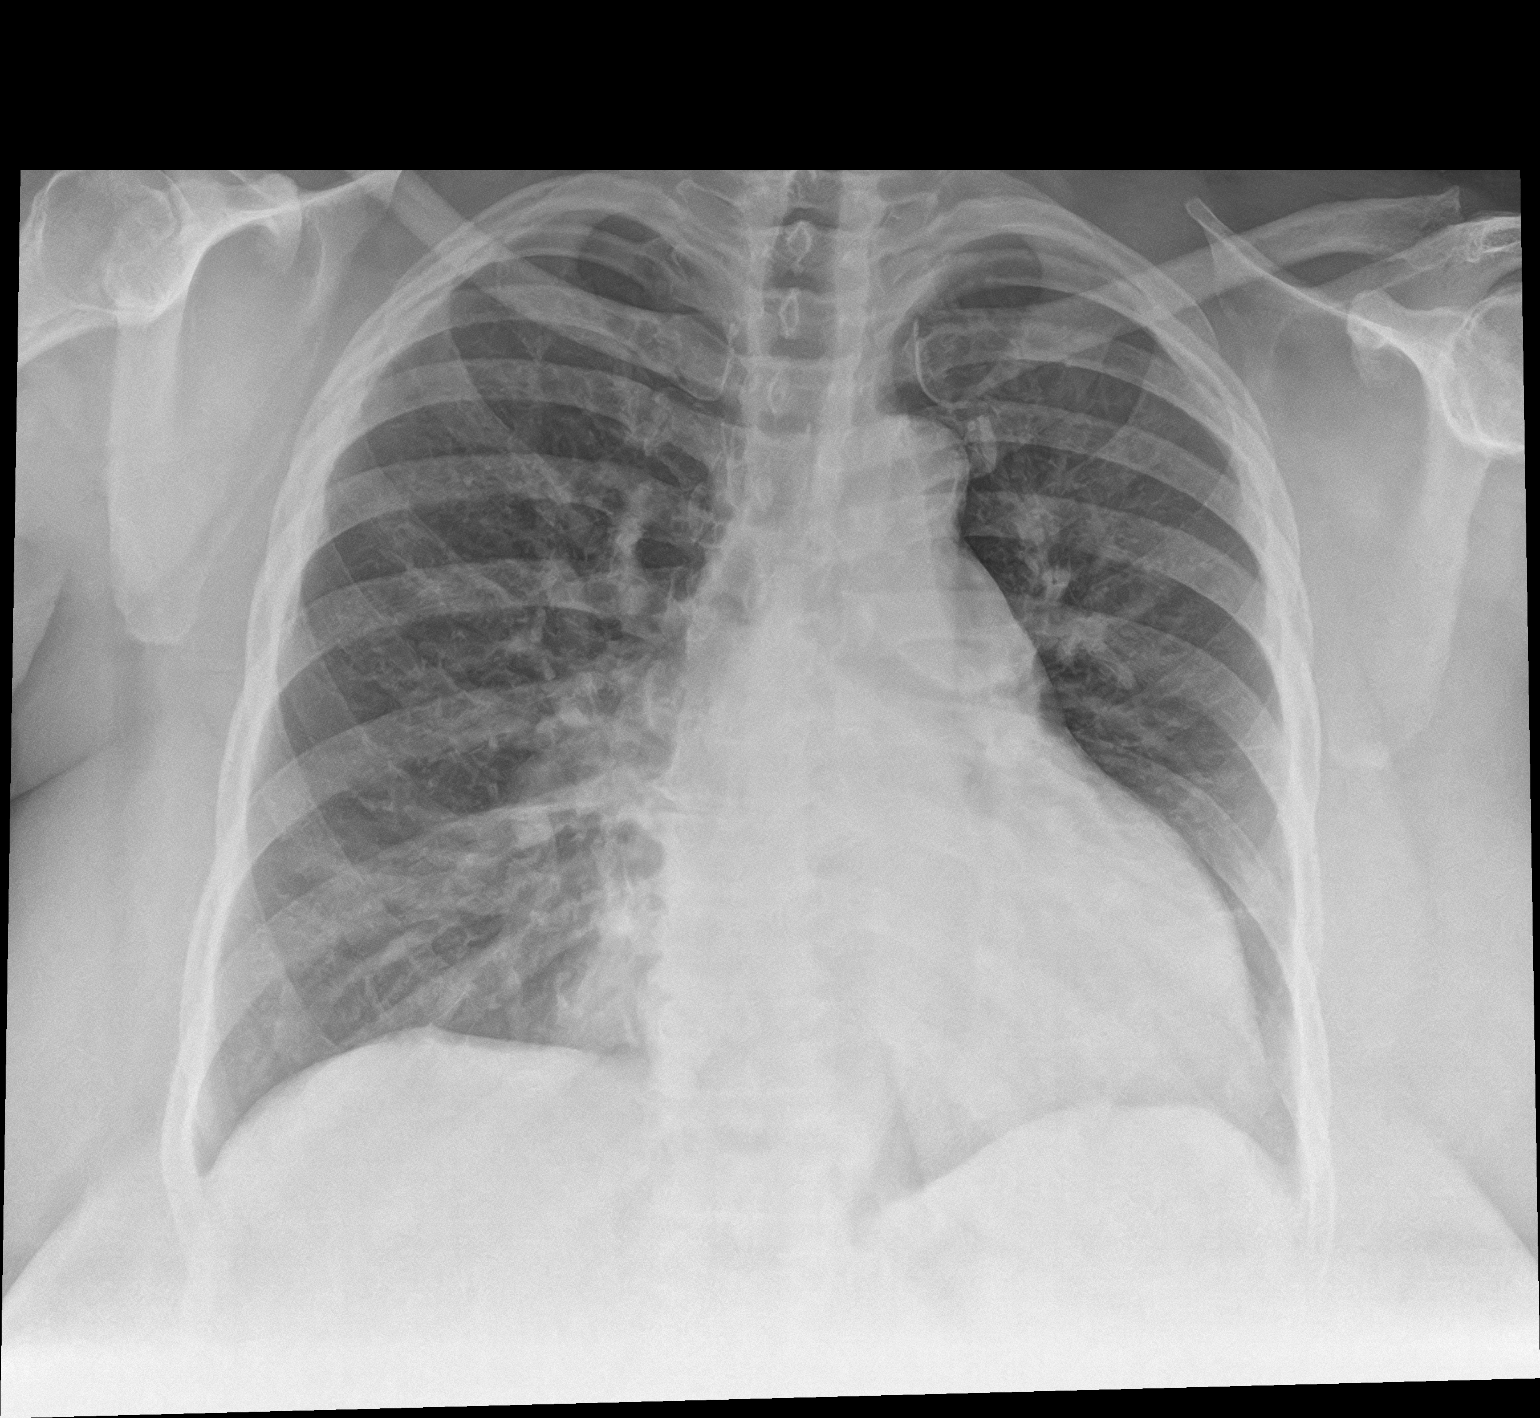

[chest lat]
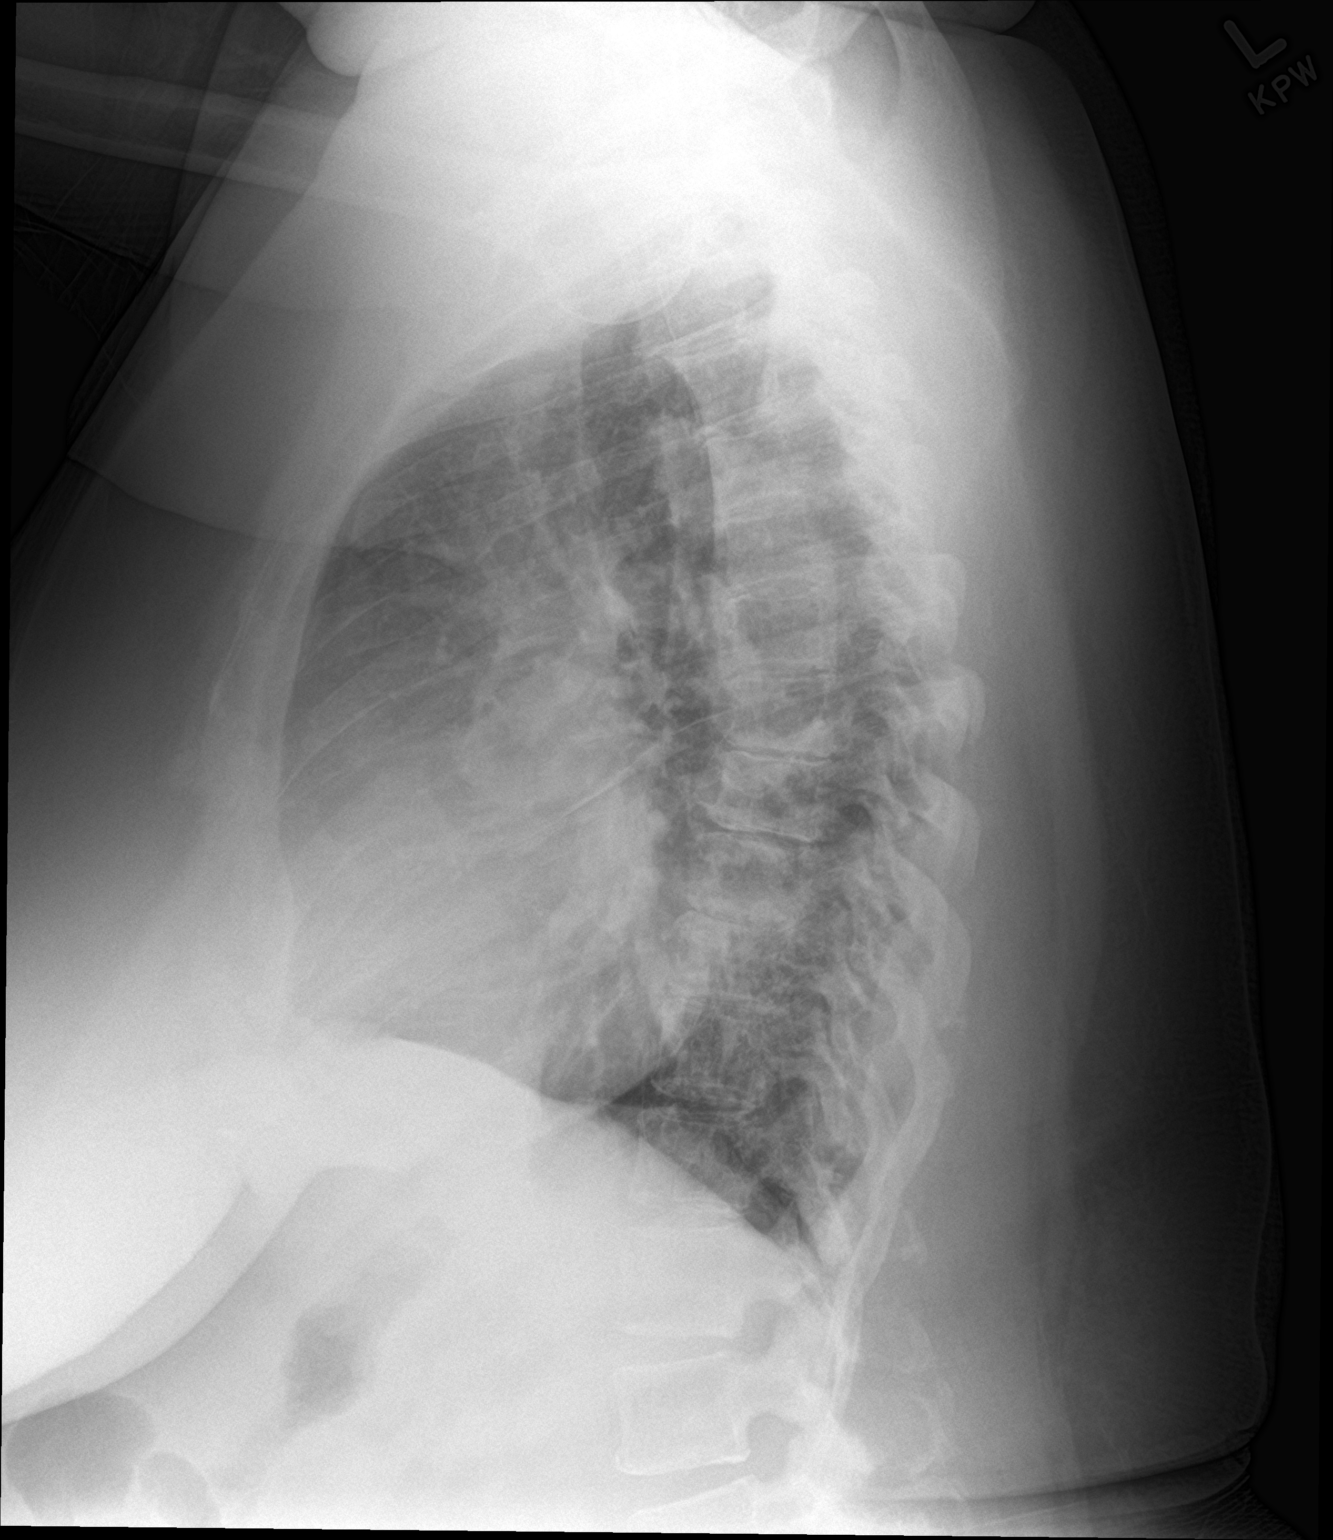

[2 of 2 positions shown; findings below may reference images not displayed]

FINDINGS: No edema or airspace opacity. Heart is mildly enlarged with
pulmonary vascularity normal. No adenopathy. There is aortic
atherosclerosis. There is degenerative change in the thoracic spine.
IMPRESSION: Heart mildly enlarged.  No edema or airspace opacity.

Aortic Atherosclerosis ([PQ]-[PQ]).

## 2020-03-11 MED ORDER — BENZONATATE 200 MG PO CAPS: 200.0000 mg | ORAL_CAPSULE | Freq: Three times a day (TID) | ORAL | 0 refills | Status: AC | PRN

## 2020-03-11 NOTE — ED Notes (Signed)
Registration at bedside with the patient.

## 2020-03-11 NOTE — ED Notes (Signed)
Pt transported to radiology.

## 2020-03-11 NOTE — Discharge Instructions (Addendum)
Follow-up with your primary care provider if any continued problems or concerns.  Continue your regular medication as prescribed by your primary care provider.  A prescription for Tessalon was sent to your pharmacy to help with cough.  Also increase fluids and stay hydrated.  If any severe worsening of your symptoms or urgent concerns return to the emergency department.  Your Covid and influenza test was negative.  Chest x-ray did not show any acute changes such as pneumonia or bronchitis.

## 2020-03-11 NOTE — ED Triage Notes (Signed)
C/O cough and cold symptoms x 5 days  AAOx3.  Skin warm and dry. NAD

## 2020-03-11 NOTE — ED Notes (Signed)
ED Provider at bedside. 

## 2020-03-11 NOTE — ED Provider Notes (Signed)
[facility] Emergency Department Provider Note   ____________________________________________   First MD Initiated Contact with Patient 03/11/20 0912     (approximate)  I have reviewed the triage vital signs and the nursing notes.   HISTORY  Chief Complaint Cough    HPI Teresa [name] is a 59 y.o. female presents to the ED with complaint of cold, cough, congestion, fever for approximately 5 days.  Patient states that she has some nausea this morning and also some diarrhea.  She also reports that her taste "comes and goes".  She is unaware of any known exposure to Covid but has not been vaccinated.  Patient is a smoker.         Past Medical History:  Diagnosis Date  . Anxiety disorder   . Arthritis   . Asthma   . Bilateral leg edema   . CAD (coronary artery disease)   . Chest pain   . Chronic back pain   . Chronic kidney disease   . COPD (chronic obstructive pulmonary disease) (HCC)   . DDD (degenerative disc disease), lumbar   . Depression   . Diabetes (HCC)   . Diabetic retinopathy (HCC)   . Genital herpes   . Heart burn   . Heart disease   . Heart murmur   . Hepatitis   . HLD (hyperlipidemia)   . Hypertension   . Impetigo   . Microscopic hematuria   . Obesity, morbid (HCC)   . On home oxygen therapy    wears 2l/ [place] at night  . Pancreatitis   . Peripheral neuropathy   . Sleep apnea   . Thyroid disease   . Urinary frequency   . Urinary incontinence   . Ventricular septal defect     Patient Active Problem List   Diagnosis Date Noted  . Pain due to onychomycosis of toenails of both feet 10/11/2018  . Morbid obesity (HCC) 10/08/2015  . Cystocele, midline 10/08/2015  . SUI (stress urinary incontinence, female) 10/08/2015  . Menopause 10/08/2015  . Urinary frequency 08/06/2015  . Microscopic hematuria 08/06/2015  . Cystocele, grade 2 08/06/2015  . Morbid (severe) obesity due to excess calories (HCC) 05/15/2015  . TIA  (transient ischemic attack) 01/01/2015  . Anterior chest wall pain 09/08/2014  . Nicotine addiction 08/19/2014  . Benign essential HTN 08/04/2014  . Combined fat and carbohydrate induced hyperlipemia 02/12/2014  . 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 Disease of lung 09/06/2013  . Heart valve disease 09/06/2013  . Chronic obstructive pulmonary disease (HCC) 02/19/2013  . Dependence on supplemental oxygen 02/19/2013  . Pulmonary hypertension (HCC) 02/19/2013  . Postprocedural state 02/19/2013  . Other specified postprocedural states 02/19/2013  . 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 Chronic pain 07/23/2012  . Diabetes mellitus (HCC) 07/23/2012  . Clinical depression 09/02/2009  . Major depressive disorder with single episode 09/02/2009  . Essential (primary) hypertension 06/01/2001    Past Surgical History:  Procedure Laterality Date  . CARDIAC SURGERY    . CYST EXCISION    . LUMBAR DISC SURGERY    . WRIST SURGERY Bilateral     Prior to Admission medications   Medication Sig Start Date End Date Taking? Authorizing Provider  acyclovir (ZOVIRAX) 400 MG tablet Take 400 mg by mouth 2 (two) times daily.    [provider]  albuterol (PROVENTIL HFA;VENTOLIN HFA) 108 (90 BASE) MCG/ACT inhaler Inhale 1-2 puffs into the lungs every 6 (six) hours as needed for wheezing or shortness  of breath.    [provider]  ALPRAZolam Prudy Feeler) 1 MG tablet Take 1 mg by mouth 3 (three) times daily as needed for anxiety. 3 to 4 times daily PRN    [provider]  amLODipine (NORVASC) 5 MG tablet Take 5 mg by mouth daily.    [provider]  aspirin 81 MG tablet Take 1 tablet (81 mg total) by mouth daily. 01/02/15   Altamese Dilling, MD  atorvastatin (LIPITOR) 20 MG tablet Take 20 mg by mouth at bedtime. 08/31/16   [provider]  benzonatate (TESSALON) 200 MG capsule Take 1 capsule (200 mg total) by mouth 3 (three) times daily as needed for  cough. 03/11/20 03/11/21  Tommi Rumps, PA-C  budesonide-formoterol (SYMBICORT) 160-4.5 MCG/ACT inhaler Inhale 2 puffs into the lungs 2 (two) times daily.  11/10/15   [provider]  cyclobenzaprine (FLEXERIL) 5 MG tablet Take by mouth. 06/24/19   [provider]  diphenoxylate-atropine (LOMOTIL) 2.5-0.025 MG tablet  01/24/19   [provider]  DULoxetine (CYMBALTA) 30 MG capsule Take by mouth. 12/22/17 12/22/18  [provider]  enalapril (VASOTEC) 5 MG tablet Take 5 mg by mouth daily.    [provider]  fluticasone (FLONASE) 50 MCG/ACT nasal spray Place 1 spray into both nostrils daily as needed.  06/04/15   [provider]  furosemide (LASIX) 40 MG tablet Take 40 mg by mouth daily.    [provider]  gabapentin (NEURONTIN) 300 MG capsule Take 300 mg by mouth 3 (three) times daily.  08/25/15   [provider]  glucose blood (ONETOUCH ULTRA) test strip Use to test blood sugars four times daily. E11.9 10/02/18   [provider]  ibuprofen (ADVIL) 600 MG tablet TAKE 1 TABLET(600 MG) BY MOUTH EVERY 8 HOURS AS NEEDED FOR PAIN 09/25/18   [provider]  insulin lispro (HUMALOG) 100 UNIT/ML injection Inject 4-10 Units into the skin 3 (three) times daily with meals. Sliding scale    [provider]  LANTUS SOLOSTAR 100 UNIT/ML Solostar Pen  01/06/18   [provider]  metoprolol succinate (TOPROL-XL) 25 MG 24 hr tablet Take 25 mg by mouth daily. 05/12/19   [provider]  metoprolol tartrate (LOPRESSOR) 25 MG tablet Take 25 mg by mouth 2 (two) times daily. Reported on 08/06/2015    [provider]  MYRBETRIQ 25 MG TB24 tablet Take 25 mg by mouth daily. 07/07/19   [provider]  NICODERM CQ 7 MG/24HR patch Place 7 mg onto the skin daily.  04/28/16   [provider]  NYSTATIN powder  01/06/18   [provider]  OXYGEN Inhale 2 Units into the lungs.      [provider]  OXYQUINOLONE SULFATE VAGINAL (TRIMO-SAN) 0.025 % GEL Place 0.025 Applicatorfuls vaginally once a week. 12/26/17   Defrancesco, Prentice Docker, MD  pantoprazole (PROTONIX) 40 MG tablet Take 40 mg by mouth daily. 05/30/19   [provider]  polyethylene glycol powder (GLYCOLAX/MIRALAX) powder Take 17 g by mouth daily as needed.  11/06/15   [provider]  Spacer/Aero-Holding Chambers (AEROCHAMBER MV) inhaler by Does not apply route. 09/13/18   [provider]  tiotropium (SPIRIVA) 18 MCG inhalation capsule Place 18 mcg into inhaler and inhale daily.     [provider]  traZODone (DESYREL) 100 MG tablet Take by mouth at bedtime as needed.     [provider]  TRIMO-SAN 0.025-0.01 % GEL SMARTSIG:1 Applicator Vaginal Once  a Week 04/25/19   [provider]    Allergies Other, Penicillins, Sulfa antibiotics, Amoxicillin, Duloxetine, and Morphine  Family History  Problem Relation Age of Onset  . Diabetes Other        2 Brother 2 Sister  . Hypertension Other   . Kidney disease Neg Hx   . Bladder Cancer Neg Hx   . Kidney cancer Neg Hx     Social History Social History   Tobacco Use  . Smoking status: Light Tobacco Smoker    Types: Cigarettes  . Smokeless tobacco: Never Used  Vaping Use  . Vaping Use: Never used  Substance Use Topics  . Alcohol use: No    Alcohol/week: 0.0 standard drinks  . Drug use: No    Review of Systems Constitutional: Positive subjective fever/chills Eyes: No visual changes. ENT: No sore throat.  Positive change in taste. Cardiovascular: Denies chest pain. Respiratory: Denies shortness of breath.  Positive cough. Gastrointestinal: No abdominal pain.  No nausea, no vomiting.  Positive diarrhea.  No constipation. Genitourinary: Negative for dysuria. Musculoskeletal: Negative for muscle skeletal pain. Skin: Negative for rash. Neurological: Negative for headaches, focal weakness or  numbness. Psychiatric:  Positive anxiety, depression. Endocrine:  Positive for diabetes. Allergic/Immunilogical: Positive allergies to penicillin, sulfa, morphine and Duloxetine.  ____________________________________________   PHYSICAL EXAM:  VITAL SIGNS: ED Triage Vitals  Enc Vitals Group     BP 03/11/20 0907 (!) 181/78     Pulse Rate 03/11/20 0907 87     Resp 03/11/20 0907 16     Temp 03/11/20 0907 98.6 F (37 C)     Temp Source 03/11/20 0907 Oral     SpO2 03/11/20 0907 94 %     Weight 03/11/20 0905 232 lb (105.2 kg)     Height 03/11/20 0905 5\' 2"  (1.575 m)     Head Circumference --      Peak Flow --      Pain Score 03/11/20 0905 0     Pain Loc --      Pain Edu? --      Excl. in GC? --     Constitutional: Alert and oriented. Well appearing and in no acute distress. Eyes: Conjunctivae are normal.  Head: Atraumatic. Nose: Mild congestion/rhinnorhea. Mouth/Throat: Mucous membranes are moist.  Oropharynx non-erythematous. Neck: No stridor.   Hematological/Lymphatic/Immunilogical: No cervical lymphadenopathy. Cardiovascular: Normal rate, regular rhythm. Grossly normal heart sounds.  Good peripheral circulation. Respiratory: Normal respiratory effort.  No retractions. Lungs CTAB. Gastrointestinal: Soft and nontender. No distention.  Bowel sounds normoactive x4 quadrants. Musculoskeletal: Patient is able move upper and lower extremities and no  difficulty normal gait was noted. Neurologic:  Normal speech and language. No gross focal neurologic deficits are appreciated.  Skin:  Skin is warm, dry and intact. No rash noted. Psychiatric: Mood and affect are normal. Speech and behavior are normal.  ____________________________________________   LABS (all labs ordered are listed, but only abnormal results are displayed)  Labs Reviewed  RESP PANEL BY RT-PCR (FLU A&B, COVID) [id number]    RADIOLOGY I, 14/01/21, personally viewed and evaluated these images (plain  radiographs) as part of my medical decision making, as well as reviewing the written report by the radiologist.  Official radiology report(s): DG Chest 2 View  Result Date: 03/11/2020 CLINICAL DATA:  Shortness of breath with cough, chest pain, and fever EXAM: CHEST - 2 VIEW COMPARISON:  August 09, 2018 chest radiograph and CT angiogram chest FINDINGS: No edema or airspace opacity.  Heart is mildly enlarged with pulmonary vascularity normal. No adenopathy. There is aortic atherosclerosis. There is degenerative change in the thoracic spine. IMPRESSION: Heart mildly enlarged.  No edema or airspace opacity. Aortic Atherosclerosis (ICD10-I70.0). Electronically Signed   By: Bretta Bang III M.D.   On: 03/11/2020 10:20    ____________________________________________   PROCEDURES  Procedure(s) performed (including Critical Care):  Procedures   ____________________________________________   INITIAL IMPRESSION / ASSESSMENT AND PLAN / ED COURSE  As part of my medical decision making, I reviewed the following data within the electronic MEDICAL RECORD NUMBER Notes from prior ED visits and [place] Controlled Substance Database  59 year old female presents to the ED with complaint of cold, cough, congestion and fever for the last 5 days.  Patient states she has not been vaccinated but has no known Covid exposure.  She also continues to smoke cigarettes.  Chest x-ray was negative for any acute changes and the viral respiratory swab was negative for Covid and influenza.  Patient was made aware.  She is to stay hydrated and a prescription for Tessalon Perles was sent to her pharmacy as needed for cough.  She is return to the emergency department if any severe worsening of her symptoms or urgent concerns otherwise she is to follow-up with her PCP.  ____________________________________________   FINAL CLINICAL IMPRESSION(S) / ED DIAGNOSES  Final diagnoses:  Viral URI with cough     ED Discharge Orders          Ordered    benzonatate (TESSALON) 200 MG capsule  3 times daily PRN        03/11/20 1044          *Please note:  Teresa [name] was evaluated in Emergency Department on 03/11/2020 for the symptoms described in the history of present illness. She was evaluated in the context of the global COVID-19 pandemic, which necessitated consideration that the patient might be at risk for infection with the SARS-CoV-2 virus that causes COVID-19. Institutional protocols and algorithms that pertain to the evaluation of patients at risk for COVID-19 are in a state of rapid change based on information released by regulatory bodies including the CDC and federal and state organizations. These policies and algorithms were followed during the patient's care in the ED.  Some ED evaluations and interventions may be delayed as a result of limited staffing during and the pandemic.*   Note:  This document was prepared using Dragon voice recognition software and may include unintentional dictation errors.    Tommi Rumps, PA-C 03/11/20 1247    Jene Every, MD 03/11/20 1259

## 2020-03-16 ENCOUNTER — Ambulatory Visit: Payer: Medicare Other | Admitting: Podiatry

## 2020-03-20 LAB — COLOGUARD: COLOGUARD: NEGATIVE

## 2020-04-16 ENCOUNTER — Ambulatory Visit (INDEPENDENT_AMBULATORY_CARE_PROVIDER_SITE_OTHER): Payer: Medicare Other | Admitting: Podiatry

## 2020-04-16 ENCOUNTER — Other Ambulatory Visit: Payer: Self-pay

## 2020-04-16 ENCOUNTER — Encounter: Payer: Self-pay | Admitting: Podiatry

## 2020-04-16 DIAGNOSIS — M79674 Pain in right toe(s): Secondary | ICD-10-CM | POA: Diagnosis not present

## 2020-04-16 DIAGNOSIS — M79675 Pain in left toe(s): Secondary | ICD-10-CM

## 2020-04-16 DIAGNOSIS — B351 Tinea unguium: Secondary | ICD-10-CM

## 2020-04-16 DIAGNOSIS — E1142 Type 2 diabetes mellitus with diabetic polyneuropathy: Secondary | ICD-10-CM

## 2020-04-16 NOTE — Progress Notes (Signed)

## 2020-07-16 ENCOUNTER — Ambulatory Visit: Payer: Medicare Other | Admitting: Podiatry

## 2020-07-23 ENCOUNTER — Other Ambulatory Visit: Payer: Self-pay

## 2020-07-23 ENCOUNTER — Encounter: Payer: Self-pay | Admitting: Podiatry

## 2020-07-23 ENCOUNTER — Ambulatory Visit (INDEPENDENT_AMBULATORY_CARE_PROVIDER_SITE_OTHER): Payer: Medicare Other | Admitting: Podiatry

## 2020-07-23 DIAGNOSIS — E1142 Type 2 diabetes mellitus with diabetic polyneuropathy: Secondary | ICD-10-CM

## 2020-07-23 DIAGNOSIS — B351 Tinea unguium: Secondary | ICD-10-CM | POA: Diagnosis not present

## 2020-07-23 DIAGNOSIS — M79675 Pain in left toe(s): Secondary | ICD-10-CM

## 2020-07-23 DIAGNOSIS — M79674 Pain in right toe(s): Secondary | ICD-10-CM

## 2020-07-23 NOTE — Progress Notes (Signed)

## 2020-07-28 ENCOUNTER — Emergency Department: Payer: Medicare Other

## 2020-07-28 ENCOUNTER — Emergency Department
Admission: EM | Admit: 2020-07-28 | Discharge: 2020-07-28 | Disposition: A | Discharge status: Home / Self Care | Payer: Medicare Other | Attending: Emergency Medicine | Admitting: Emergency Medicine

## 2020-07-28 ENCOUNTER — Other Ambulatory Visit: Payer: Self-pay

## 2020-07-28 DIAGNOSIS — E11649 Type 2 diabetes mellitus with hypoglycemia without coma: Secondary | ICD-10-CM | POA: Insufficient documentation

## 2020-07-28 DIAGNOSIS — I129 Hypertensive chronic kidney disease with stage 1 through stage 4 chronic kidney disease, or unspecified chronic kidney disease: Secondary | ICD-10-CM | POA: Diagnosis not present

## 2020-07-28 DIAGNOSIS — Z7982 Long term (current) use of aspirin: Secondary | ICD-10-CM | POA: Diagnosis not present

## 2020-07-28 DIAGNOSIS — E1122 Type 2 diabetes mellitus with diabetic chronic kidney disease: Secondary | ICD-10-CM | POA: Diagnosis not present

## 2020-07-28 DIAGNOSIS — F17210 Nicotine dependence, cigarettes, uncomplicated: Secondary | ICD-10-CM | POA: Diagnosis not present

## 2020-07-28 DIAGNOSIS — Z79899 Other long term (current) drug therapy: Secondary | ICD-10-CM | POA: Insufficient documentation

## 2020-07-28 DIAGNOSIS — R0789 Other chest pain: Secondary | ICD-10-CM | POA: Diagnosis not present

## 2020-07-28 DIAGNOSIS — Z794 Long term (current) use of insulin: Secondary | ICD-10-CM | POA: Diagnosis not present

## 2020-07-28 DIAGNOSIS — I2510 Atherosclerotic heart disease of native coronary artery without angina pectoris: Secondary | ICD-10-CM | POA: Diagnosis not present

## 2020-07-28 DIAGNOSIS — E11319 Type 2 diabetes mellitus with unspecified diabetic retinopathy without macular edema: Secondary | ICD-10-CM | POA: Diagnosis not present

## 2020-07-28 DIAGNOSIS — J45909 Unspecified asthma, uncomplicated: Secondary | ICD-10-CM | POA: Insufficient documentation

## 2020-07-28 DIAGNOSIS — R600 Localized edema: Secondary | ICD-10-CM | POA: Diagnosis not present

## 2020-07-28 DIAGNOSIS — E162 Hypoglycemia, unspecified: Secondary | ICD-10-CM

## 2020-07-28 DIAGNOSIS — N189 Chronic kidney disease, unspecified: Secondary | ICD-10-CM | POA: Diagnosis not present

## 2020-07-28 DIAGNOSIS — J449 Chronic obstructive pulmonary disease, unspecified: Secondary | ICD-10-CM | POA: Diagnosis not present

## 2020-07-28 DIAGNOSIS — R079 Chest pain, unspecified: Secondary | ICD-10-CM | POA: Diagnosis present

## 2020-07-28 LAB — CBC WITH DIFFERENTIAL/PLATELET
Abs Immature Granulocytes: 0.04 10*3/uL (ref 0.00–0.07)
Basophils Absolute: 0 10*3/uL (ref 0.0–0.1)
Basophils Relative: 0 %
Eosinophils Absolute: 0.2 10*3/uL (ref 0.0–0.5)
Eosinophils Relative: 2 %
HCT: 35.1 % — ABNORMAL LOW (ref 36.0–46.0)
Hemoglobin: 10.7 g/dL — ABNORMAL LOW (ref 12.0–15.0)
Immature Granulocytes: 0 %
Lymphocytes Relative: 31 %
Lymphs Abs: 3.4 10*3/uL (ref 0.7–4.0)
MCH: 28.8 pg (ref 26.0–34.0)
MCHC: 30.5 g/dL (ref 30.0–36.0)
MCV: 94.4 fL (ref 80.0–100.0)
Monocytes Absolute: 0.6 10*3/uL (ref 0.1–1.0)
Monocytes Relative: 6 %
Neutro Abs: 6.6 10*3/uL (ref 1.7–7.7)
Neutrophils Relative %: 61 %
Platelets: 327 10*3/uL (ref 150–400)
RBC: 3.72 MIL/uL — ABNORMAL LOW (ref 3.87–5.11)
RDW: 13.4 % (ref 11.5–15.5)
WBC: 10.8 10*3/uL — ABNORMAL HIGH (ref 4.0–10.5)
nRBC: 0 % (ref 0.0–0.2)

## 2020-07-28 LAB — COMPREHENSIVE METABOLIC PANEL
ALT: 8 U/L (ref 0–44)
AST: 16 U/L (ref 15–41)
Albumin: 3.7 g/dL (ref 3.5–5.0)
Alkaline Phosphatase: 59 U/L (ref 38–126)
Anion gap: 6 (ref 5–15)
BUN: 12 mg/dL (ref 6–20)
CO2: 29 mmol/L (ref 22–32)
Calcium: 9 mg/dL (ref 8.9–10.3)
Chloride: 107 mmol/L (ref 98–111)
Creatinine, Ser: 0.86 mg/dL (ref 0.44–1.00)
GFR, Estimated: >60 mL/min (ref >60)
Glucose, Bld: 54 mg/dL — ABNORMAL LOW (ref 70–99)
Potassium: 4.1 mmol/L (ref 3.5–5.1)
Sodium: 142 mmol/L (ref 135–145)
Total Bilirubin: 0.5 mg/dL (ref 0.3–1.2)
Total Protein: 7.2 g/dL (ref 6.5–8.1)

## 2020-07-28 LAB — CBG MONITORING, ED
Glucose-Capillary: 40 mg/dL — CL (ref 70–99)
Glucose-Capillary: 142 mg/dL — ABNORMAL HIGH (ref 70–99)
Glucose-Capillary: 125 mg/dL — ABNORMAL HIGH (ref 70–99)
Glucose-Capillary: 70 mg/dL (ref 70–99)
Glucose-Capillary: 103 mg/dL — ABNORMAL HIGH (ref 70–99)

## 2020-07-28 LAB — D-DIMER, QUANTITATIVE: D-Dimer, Quant: 0.5 ug/mL-FEU (ref 0.00–0.50)

## 2020-07-28 LAB — TROPONIN I (HIGH SENSITIVITY)
Troponin I (High Sensitivity): 3 ng/L (ref <18)
Troponin I (High Sensitivity): 3 ng/L (ref <18)

## 2020-07-28 LAB — PROCALCITONIN: Procalcitonin: <0.1 ng/mL

## 2020-07-28 LAB — BRAIN NATRIURETIC PEPTIDE: B Natriuretic Peptide: 33.1 pg/mL (ref 0.0–100.0)

## 2020-07-28 LAB — LIPASE, BLOOD: Lipase: 68 U/L — ABNORMAL HIGH (ref 11–51)

## 2020-07-28 IMAGING — DX DG CHEST 1V PORT
1 series · 1 of 1 positions shown · non-contrast
Comparison: [DATE]

CLINICAL DATA: Chest pain

EXAM:
PORTABLE CHEST 1 VIEW

[chest ap]
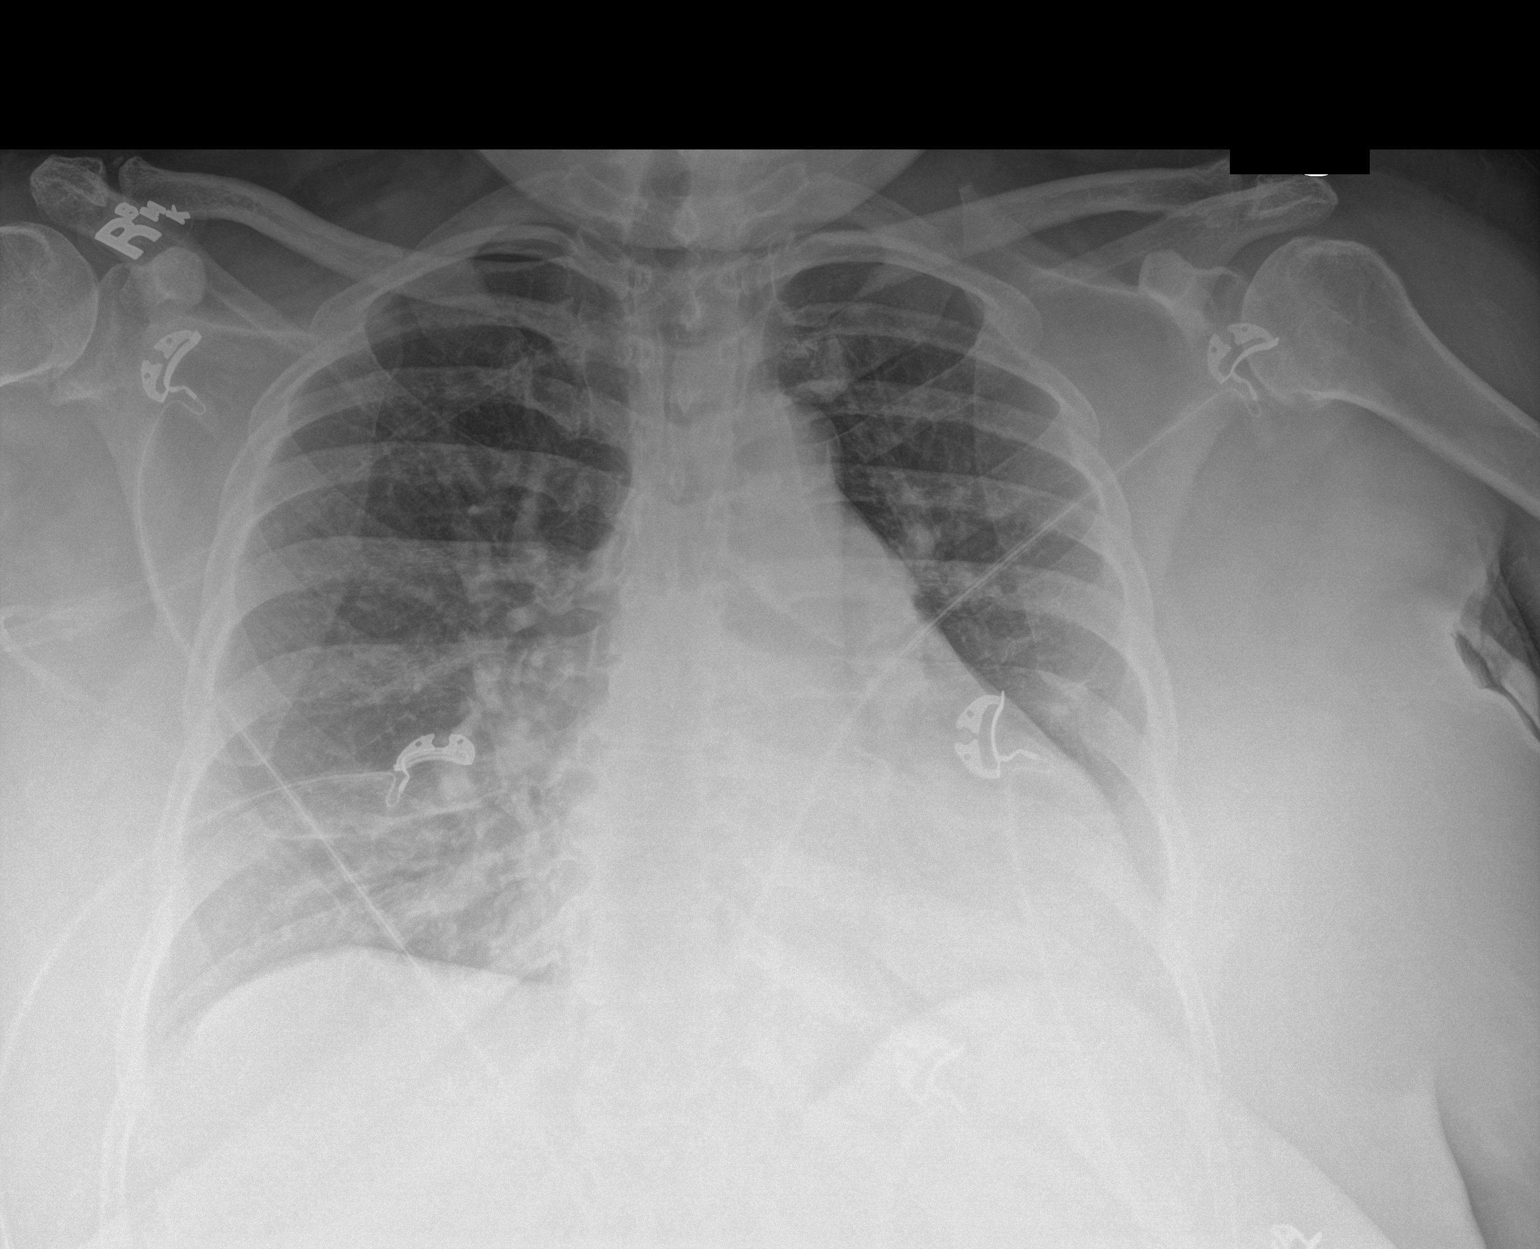

[1 of 1 positions shown; findings below may reference images not displayed]

FINDINGS: Unchanged mild cardiomegaly. Both lungs are clear. The visualized
skeletal structures are unremarkable.
IMPRESSION: Unchanged mild cardiomegaly without acute airspace disease.

## 2020-07-28 IMAGING — CT CT ANGIO CHEST-ABD-PELV FOR DISSECTION W/ AND WO/W CM
2 of 7 series · 15 of 46 positions shown, 17 images · IV contrast (APPLIED)
Comparison: Abdomen and pelvis CT [DATE]. Chest CTA [DATE]

CLINICAL DATA: Chest pain radiating to both arms in the back, rule
out aortic dissection

EXAM:
CT ANGIOGRAPHY CHEST, ABDOMEN AND PELVIS
TECHNIQUE: Non-contrast CT of the chest was initially obtained.

[Series 6: axial arterial · axial · arterial · 0.93mm/px · z∈[-979,-463]mm · 12 of 200 slices shown, 14 images]
[im 14/200  soft-tissue]
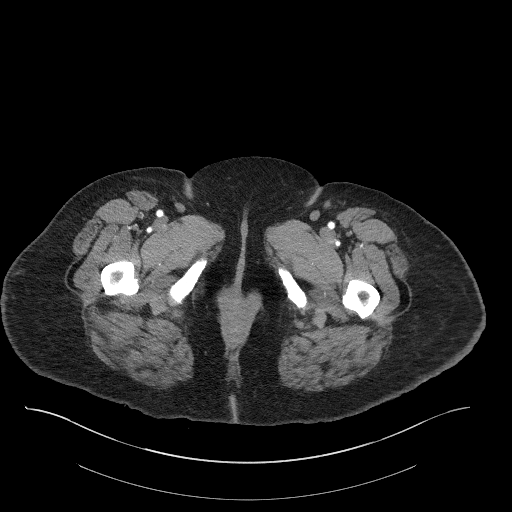
[im 14/200  bone]
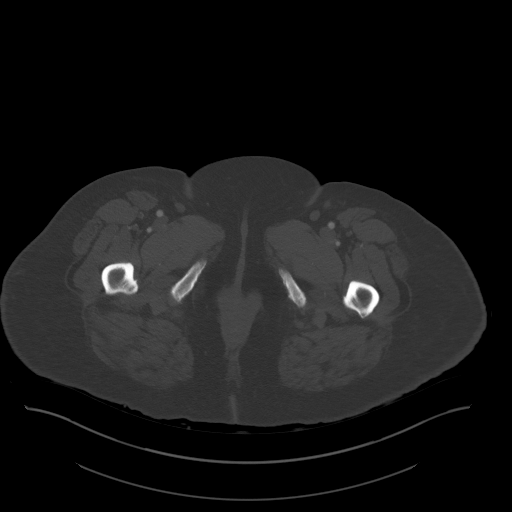
[im 27/200  soft-tissue]
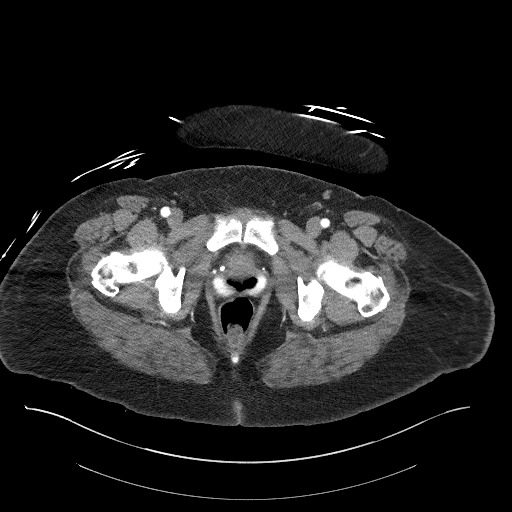
[im 40/200  soft-tissue]
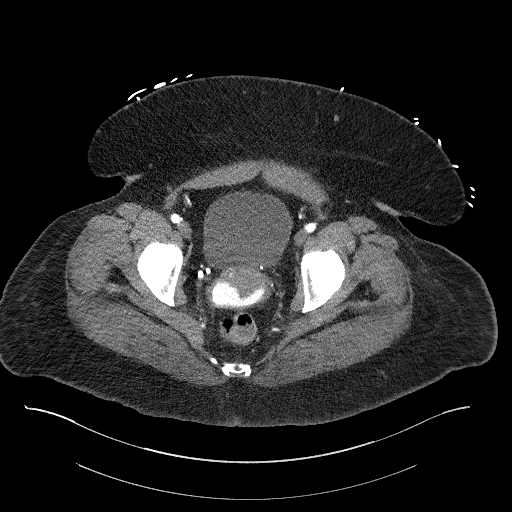
[im 67/200  soft-tissue]
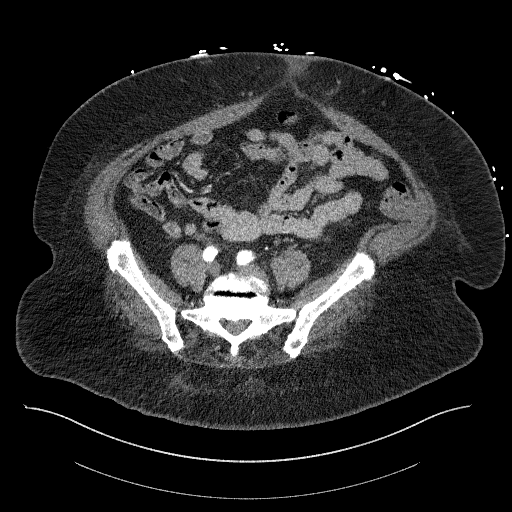
[im 80/200  soft-tissue]
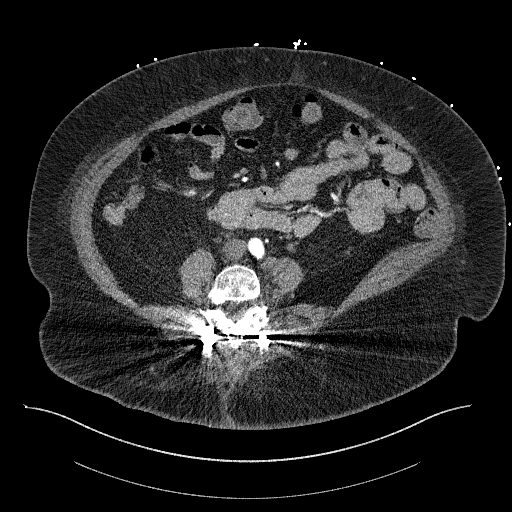
[im 93/200  soft-tissue]
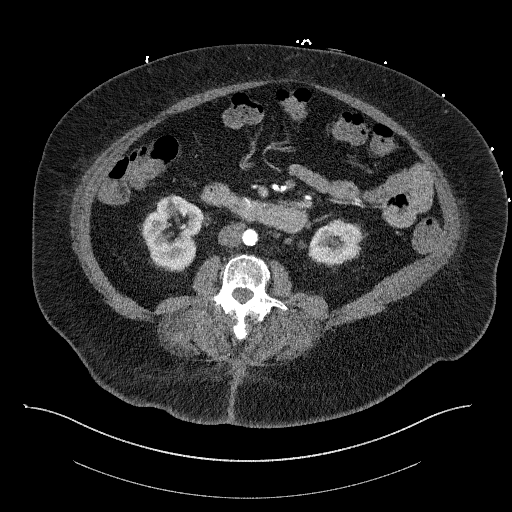
[im 107/200  soft-tissue]
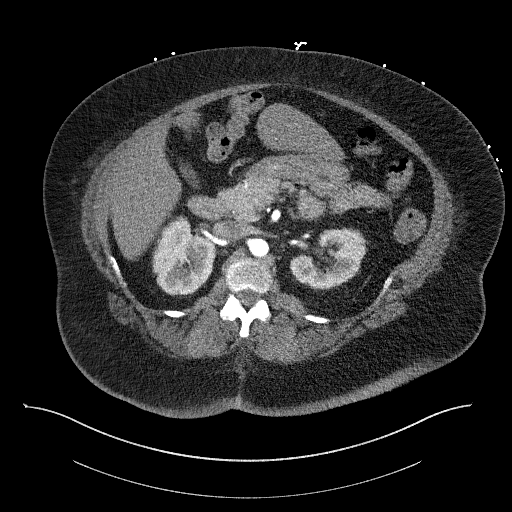
[im 120/200  soft-tissue]
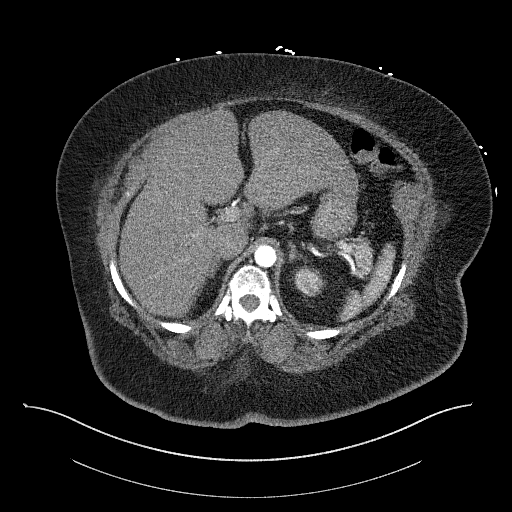
[im 133/200  soft-tissue]
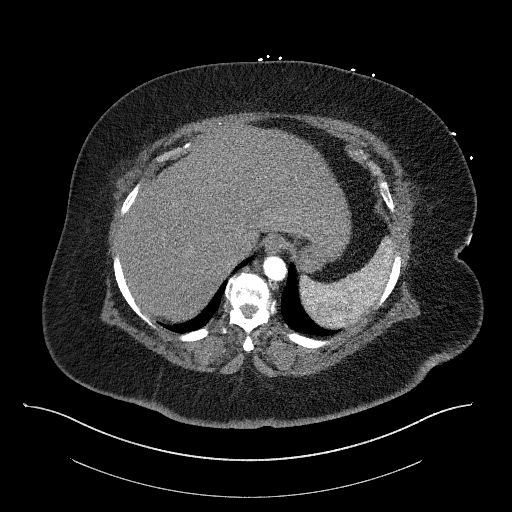
[im 133/200  bone]
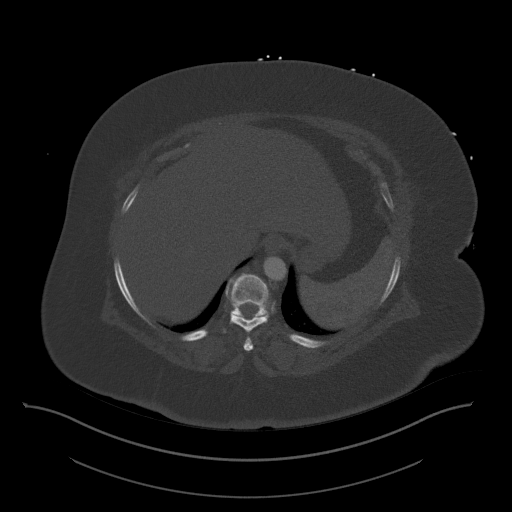
[im 160/200  soft-tissue]
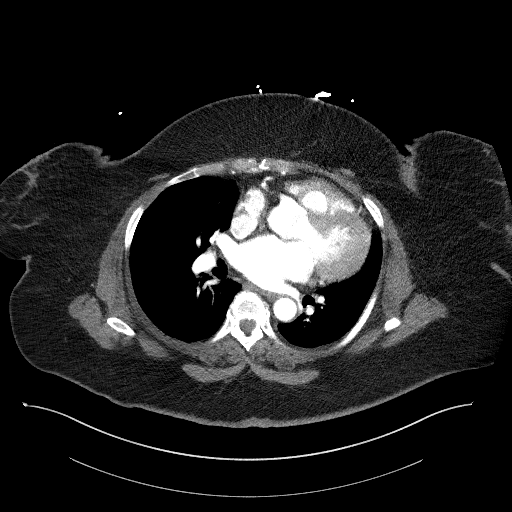
[im 173/200  soft-tissue]
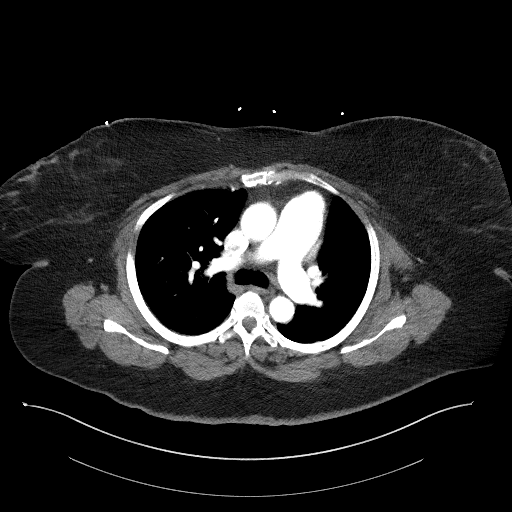
[im 186/200  soft-tissue]
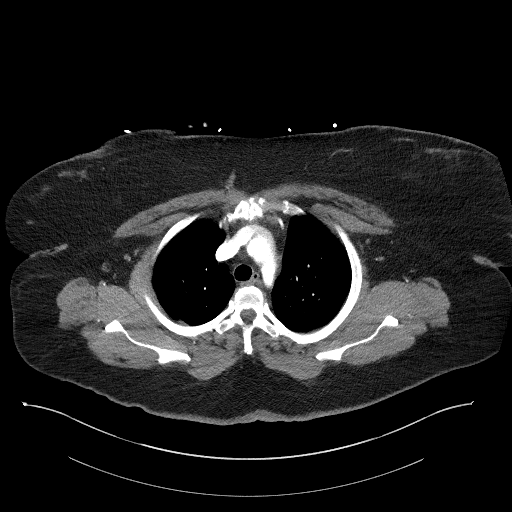

[Series 9: coronals · coronal · 0.82mm/px · 3 of 176 slices shown]
[im 44/176  soft-tissue]
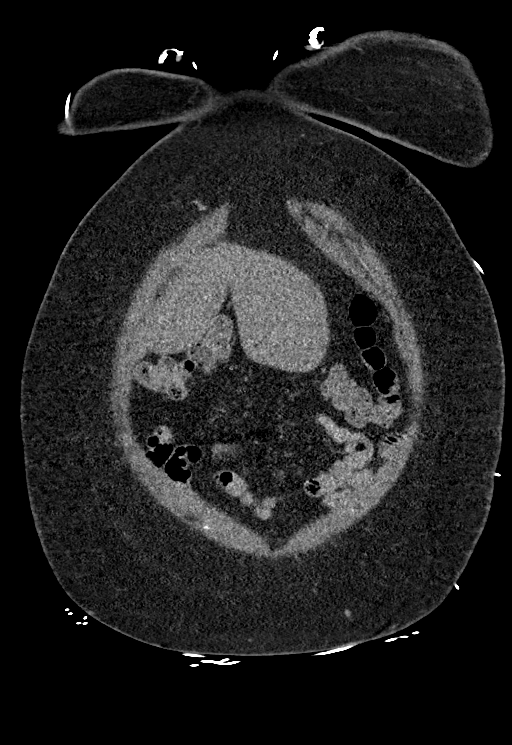
[im 88/176  soft-tissue]
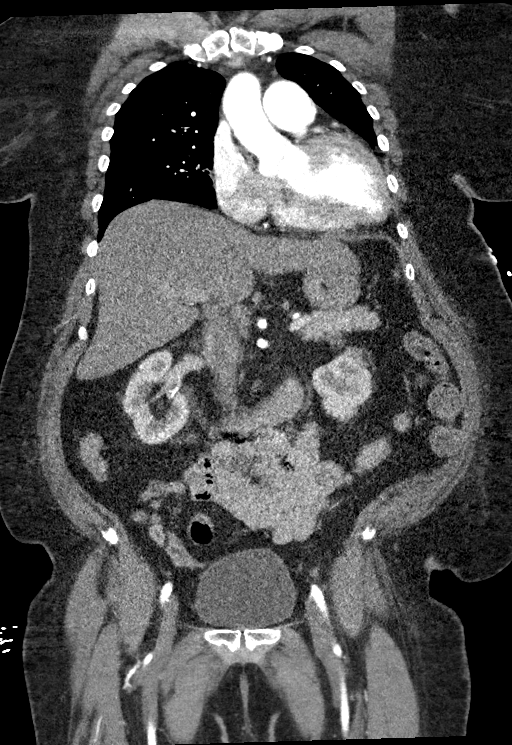
[im 132/176  soft-tissue]
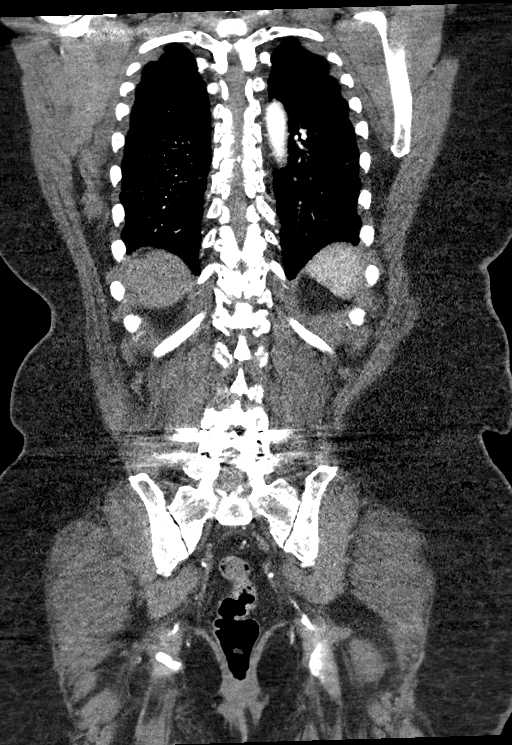

[15 of 46 positions shown; findings below may reference images not displayed]

Multidetector CT imaging through the chest, abdomen and pelvis was
performed using the standard protocol during bolus administration of
intravenous contrast. Multiplanar reconstructed images and MIPs were
obtained and reviewed to evaluate the vascular anatomy.

CONTRAST:  100mL OMNIPAQUE IOHEXOL 350 MG/ML SOLN
FINDINGS: CTA CHEST FINDINGS

Cardiovascular: Preferential opacification of the thoracic aorta. No
evidence of thoracic aortic aneurysm or dissection. Enlarged heart
size. No pericardial effusion. Large main pulmonary artery (36 mm in
diameter) suggesting pulmonary hypertension.

Mediastinum/Nodes: Nodular thyroid including left-sided nodule
measuring 2.1 cm, also present on prior based on left lobe size
previously.

Lungs/Pleura: Bilateral atelectasis. Medial segment right middle
lobe scarring. There is no edema, consolidation, effusion, or
pneumothorax.

Musculoskeletal: Thoracic spondylosis/disc degeneration. No acute
finding

Review of the MIP images confirms the above findings.

CTA ABDOMEN AND PELVIS FINDINGS

VASCULAR

Aorta: Atheromatous plaque.  No dissection or aneurysm

Celiac: Unremarkable

SMA: Unremarkable

Renals: Single bilateral renal artery with smooth contour and wide
patency

IMA: Patent

Inflow: Scattered calcified atheromatous calcification. No stenosis
or dissection

Veins: Unremarkable in the arterial phase

Review of the MIP images confirms the above findings.

NON-VASCULAR

Hepatobiliary: No focal liver abnormality.No evidence of biliary
obstruction or stone.

Pancreas: Unremarkable.

Spleen: Unremarkable.

Adrenals/Urinary Tract: Negative adrenals. No hydronephrosis or
stone. Unremarkable bladder.

Stomach/Bowel:  No obstruction. No appendicitis.

Vascular/Lymphatic: No acute vascular abnormality. No mass or
adenopathy.

Reproductive:Pessary in place

Other: No ascites or pneumoperitoneum. Fatty epigastric and
umbilical hernias in the midline.

Musculoskeletal: No acute abnormalities. Lumbar spine degeneration
with L3-4 anterolisthesis. L4-5 solid arthrodesis. Advanced L5-S1
disc degeneration with biforaminal narrowing.

Review of the MIP images confirms the above findings.
IMPRESSION: 1. No acute finding.  No sign of acute aortic syndrome.
2. Finding of pulmonary hypertension.
3.  Aortic Atherosclerosis ([0X]-[0X]).
4. Fatty epigastric and umbilical hernias.
5. 2.1 cm left thyroid nodule. Recommend thyroid US (ref: [HOSPITAL]. [DATE]): 143-50).

## 2020-07-28 MED ORDER — DEXTROSE 50 % IV SOLN
1.0000 | Freq: Once | INTRAVENOUS | Status: AC
  Administered 2020-07-28: 50 mL via INTRAVENOUS

## 2020-07-28 MED ORDER — IOHEXOL 350 MG/ML SOLN
100.0000 mL | Freq: Once | INTRAVENOUS | Status: AC | PRN
  Administered 2020-07-28: 100 mL via INTRAVENOUS

## 2020-07-28 NOTE — Discharge Instructions (Signed)
Make sure to decrease her insulin if you are not eating as well.  Check your blood glucose before taking insulin and if it is less than 120 hold the insulin.  Follow-up with your primary care doctor for further evaluation of your shoulder/chest pain.  Return to the emergency room for new or worsening pain, shortness of breath, fever, abdominal pain.  Otherwise you may take 1000 mg of ibuprofen every 8 hours for pain.

## 2020-07-28 NOTE — ED Triage Notes (Signed)
Pt to ED via [facility] with c/o chest pain that radiates to both arms, back, and neck. Pt states that it feels like muscle pain. Pain began several days ago but got worse tonight. Pt on 2L [place] at baseline. CBG 60 with EMS. Oral glucose given. Alert and oriented x4. NAD noted.

## 2020-07-28 NOTE — ED Notes (Signed)
  CBG 142  

## 2020-07-28 NOTE — ED Notes (Signed)
Pt given orange juice, crackers, and peanut butter to eat prior to discharge per Don Perking MD.

## 2020-07-28 NOTE — ED Notes (Signed)
Ed signature pad unavailable

## 2020-07-28 NOTE — ED Provider Notes (Signed)
[facility] Emergency Department Provider Note  ____________________________________________  Time seen: Approximately 3:41 AM  I have reviewed the triage vital signs and the nursing notes.   HISTORY  Chief Complaint Chest Pain and Hypoglycemia   HPI Teresa [name] is a 60 y.o. female history of COPD and asthma on 2 L nasal cannula, OSA on CPAP, CAD, bilateral leg edema, diabetes, VSD status postrepair, hypertension, hyperlipidemia who presents for evaluation of chest pain.  Patient reports several days of constant chest pain that she describes as sharp located in the center of her chest radiating to her back and down bilateral arms.  The pain has been constant but became worse this evening which made her call 911. Slightly more short of breath than normal. She reports that sometimes she feels like the pain starts in the shoulders and radiates across the arms/ chest/ back. No neck pain. She reports having tendinitis on the shoulders before which felt similar. She has had decreased appetite but continues to take her insulin.  She was found to have a CBG of 60 per EMS and was given some oral glucose.  She has had mild change in her chronic cough  but reports clear phlegm.  Has been using her CPAP and her oxygen as supposed to.  Denies fever body aches, leg pain or swelling, hemoptysis, history of PE or DVT, recent travel immobilization, leg pain or swelling, or exogenous hormones.  Denies any known sick contact exposures.  Past Medical History:  Diagnosis Date  . Anxiety disorder   . Arthritis   . Asthma   . Bilateral leg edema   . CAD (coronary artery disease)   . Chest pain   . Chronic back pain   . Chronic kidney disease   . COPD (chronic obstructive pulmonary disease) (HCC)   . DDD (degenerative disc disease), lumbar   . Depression   . Diabetes (HCC)   . Diabetic retinopathy (HCC)   . Genital herpes   . Heart burn   . Heart disease   . Heart murmur    . Hepatitis   . HLD (hyperlipidemia)   . Hypertension   . Impetigo   . Microscopic hematuria   . Obesity, morbid (HCC)   . On home oxygen therapy    wears 2l/ [place] at night  . Pancreatitis   . Peripheral neuropathy   . Sleep apnea   . Thyroid disease   . Urinary frequency   . Urinary incontinence   . Ventricular septal defect     Patient Active Problem List   Diagnosis Date Noted  . Pain due to onychomycosis of toenails of both feet 10/11/2018  . Morbid obesity (HCC) 10/08/2015  . Cystocele, midline 10/08/2015  . SUI (stress urinary incontinence, female) 10/08/2015  . Menopause 10/08/2015  . Urinary frequency 08/06/2015  . Microscopic hematuria 08/06/2015  . Cystocele, grade 2 08/06/2015  . Morbid (severe) obesity due to excess calories (HCC) 05/15/2015  . TIA (transient ischemic attack) 01/01/2015  . Anterior chest wall pain 09/08/2014  . Nicotine addiction 08/19/2014  . Benign essential HTN 08/04/2014  . Combined fat and carbohydrate induced hyperlipemia 02/12/2014  . 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 Disease of lung 09/06/2013  . Heart valve disease 09/06/2013  . Chronic obstructive pulmonary disease (HCC) 02/19/2013  . Dependence on supplemental oxygen 02/19/2013  . Pulmonary hypertension (HCC) 02/19/2013  . Postprocedural state 02/19/2013  . Other specified postprocedural states 02/19/2013  . 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  Chronic pain 07/23/2012  . Diabetes mellitus (HCC) 07/23/2012  . Clinical depression 09/02/2009  . Major depressive disorder with single episode 09/02/2009  . Essential (primary) hypertension 06/01/2001    Past Surgical History:  Procedure Laterality Date  . CARDIAC SURGERY    . CYST EXCISION    . LUMBAR DISC SURGERY    . WRIST SURGERY Bilateral     Prior to Admission medications   Medication Sig Start Date End Date Taking? Authorizing Provider  acyclovir (ZOVIRAX) 400 MG tablet Take 400 mg by mouth 2 (two)  times daily.    [provider]  albuterol (PROVENTIL HFA;VENTOLIN HFA) 108 (90 BASE) MCG/ACT inhaler Inhale 1-2 puffs into the lungs every 6 (six) hours as needed for wheezing or shortness of breath.    [provider]  ALPRAZolam Prudy Feeler) 1 MG tablet Take 1 mg by mouth 3 (three) times daily as needed for anxiety. 3 to 4 times daily PRN    [provider]  amLODipine (NORVASC) 5 MG tablet Take 5 mg by mouth daily.    [provider]  aspirin 81 MG tablet Take 1 tablet (81 mg total) by mouth daily. 01/02/15   Altamese Dilling, MD  atorvastatin (LIPITOR) 20 MG tablet Take 20 mg by mouth at bedtime. 08/31/16   [provider]  benzonatate (TESSALON) 200 MG capsule Take 1 capsule (200 mg total) by mouth 3 (three) times daily as needed for cough. 03/11/20 03/11/21  Tommi Rumps, PA-C  budesonide-formoterol (SYMBICORT) 160-4.5 MCG/ACT inhaler Inhale 2 puffs into the lungs 2 (two) times daily.  11/10/15   [provider]  cyclobenzaprine (FLEXERIL) 5 MG tablet Take by mouth. 06/24/19   [provider]  diphenoxylate-atropine (LOMOTIL) 2.5-0.025 MG tablet  01/24/19   [provider]  DULoxetine (CYMBALTA) 30 MG capsule Take by mouth. 12/22/17 12/22/18  [provider]  enalapril (VASOTEC) 5 MG tablet Take 5 mg by mouth daily.    [provider]  fluticasone (FLONASE) 50 MCG/ACT nasal spray Place 1 spray into both nostrils daily as needed.  06/04/15   [provider]  furosemide (LASIX) 40 MG tablet Take 40 mg by mouth daily.    [provider]  gabapentin (NEURONTIN) 300 MG capsule Take 300 mg by mouth 3 (three) times daily.  08/25/15   [provider]  glucose blood (ONETOUCH ULTRA) test strip Use to test blood sugars four times daily. E11.9 10/02/18   [provider]  ibuprofen (ADVIL) 600 MG tablet TAKE 1 TABLET(600 MG) BY MOUTH EVERY 8 HOURS AS NEEDED FOR PAIN 09/25/18   [provider]  insulin lispro (HUMALOG) 100 UNIT/ML injection Inject 4-10 Units into the skin 3 (three) times daily with meals. Sliding scale    [provider]  LANTUS SOLOSTAR 100 UNIT/ML Solostar Pen  01/06/18   [provider]  metoprolol succinate (TOPROL-XL) 25 MG 24 hr tablet Take 25 mg by mouth daily. 05/12/19   [provider]  metoprolol tartrate (LOPRESSOR) 25 MG tablet Take 25 mg by mouth 2 (two) times daily. Reported on 08/06/2015    [provider]  MYRBETRIQ 25 MG TB24 tablet Take 25 mg by mouth daily. 07/07/19   [provider]  NICODERM CQ 7 MG/24HR patch Place 7 mg onto the skin daily.  04/28/16   [provider]  NYSTATIN powder  01/06/18   [provider]  OXYGEN Inhale 2 Units into the lungs.     [provider]  OXYQUINOLONE SULFATE VAGINAL (TRIMO-SAN) 0.025 % GEL Place 0.025 Applicatorfuls vaginally once a week. 12/26/17   Defrancesco, Prentice DockerMartin A, MD  pantoprazole (PROTONIX) 40 MG tablet Take 40 mg by mouth daily. 05/30/19   [provider]  polyethylene glycol powder (GLYCOLAX/MIRALAX) powder Take 17 g by mouth daily as needed.  11/06/15   [provider]  Spacer/Aero-Holding Chambers (AEROCHAMBER MV) inhaler by Does not apply route. 09/13/18   [provider]  tiotropium (SPIRIVA) 18 MCG inhalation capsule Place 18 mcg into inhaler and inhale daily.     [provider]  traZODone (DESYREL) 100 MG tablet Take by mouth at bedtime as needed.     [provider]  TRIMO-SAN 0.025-0.01 % GEL SMARTSIG:1 Applicator Vaginal Once a Week 04/25/19   [provider]    Allergies Other, Penicillins, Sulfa antibiotics, Amoxicillin, Duloxetine, and Morphine  Family History  Problem Relation Age of Onset  . Diabetes Other        2 Brother 2 Sister  . Hypertension Other   . Kidney disease Neg Hx   . Bladder Cancer Neg Hx   . Kidney cancer Neg Hx     Social  History Social History   Tobacco Use  . Smoking status: Light Tobacco Smoker    Types: Cigarettes  . Smokeless tobacco: Never Used  Vaping Use  . Vaping Use: Never used  Substance Use Topics  . Alcohol use: No    Alcohol/week: 0.0 standard drinks  . Drug use: No    Review of Systems  Constitutional: Negative for fever. Eyes: Negative for visual changes. ENT: Negative for sore throat. Neck: No neck pain  Cardiovascular: + chest pain. Respiratory: + shortness of breath, cough Gastrointestinal: Negative for abdominal pain, vomiting or diarrhea. Genitourinary: Negative for dysuria. Musculoskeletal: Negative for back pain. Skin: Negative for rash. Neurological: Negative for headaches, weakness or numbness. Psych: No SI or HI  ____________________________________________   PHYSICAL EXAM:  VITAL SIGNS: ED Triage Vitals  Enc Vitals Group     BP 07/28/20 0304 139/68     Pulse Rate 07/28/20 0304 80     Resp 07/28/20 0304 20     Temp 07/28/20 0304 97.7 F (36.5 C)     Temp Source 07/28/20 0304 Oral     SpO2 07/28/20 0304 96 %     Weight 07/28/20 0319 250 lb (113.4 kg)     Height 07/28/20 0319 5\' 2"  (1.575 m)     Head Circumference --      Peak Flow --      Pain Score 07/28/20 0318 8     Pain Loc --      Pain Edu? --      Excl. in [place]? --     Constitutional: Alert and oriented, in no no apparent distress. HEENT:      Head: Normocephalic and atraumatic.         Eyes: Conjunctivae are normal. Sclera is non-icteric.       Mouth/Throat: Mucous membranes are moist.       Neck: Supple with no signs of meningismus. Cardiovascular: Regular rate and rhythm. No murmurs, gallops, or rubs. 2+ symmetrical distal pulses are present in all extremities. No JVD. Respiratory: Normal respiratory effort. Lungs are clear to auscultation bilaterally.  Gastrointestinal: Soft, non tender, and non distended. Musculoskeletal:  Trace pitting edema bilaterally Neurologic: Normal speech and  language. Face is symmetric. Moving all extremities. No gross focal neurologic deficits are appreciated. Skin: Skin is warm, dry and  intact. No rash noted. Psychiatric: Mood and affect are normal. Speech and behavior are normal.  ____________________________________________   LABS (all labs ordered are listed, but only abnormal results are displayed)  Labs Reviewed  CBC WITH DIFFERENTIAL/PLATELET - Abnormal; Notable for the following components:      Result Value   WBC 10.8 (*)    RBC 3.72 (*)    Hemoglobin 10.7 (*)    HCT 35.1 (*)    All other components within normal limits  COMPREHENSIVE METABOLIC PANEL - Abnormal; Notable for the following components:   Glucose, Bld 54 (*)    All other components within normal limits  LIPASE, BLOOD - Abnormal; Notable for the following components:   Lipase 68 (*)    All other components within normal limits  CBG MONITORING, ED - Abnormal; Notable for the following components:   Glucose-Capillary 40 (*)    All other components within normal limits  CBG MONITORING, ED - Abnormal; Notable for the following components:   Glucose-Capillary 142 (*)    All other components within normal limits  CBG MONITORING, ED - Abnormal; Notable for the following components:   Glucose-Capillary 125 (*)    All other components within normal limits  D-DIMER, QUANTITATIVE  PROCALCITONIN  BRAIN NATRIURETIC PEPTIDE  CBG MONITORING, ED  TROPONIN I (HIGH SENSITIVITY)  TROPONIN I (HIGH SENSITIVITY)   ____________________________________________  EKG  ED ECG REPORT I, Nita Sickle, the attending physician, personally viewed and interpreted this ECG.  Sinus rhythm with occasional PACs, rate of 81, normal QTC, normal intervals, normal axis, no ST elevations or depressions.  No significant changes when compared to prior from 2020. ____________________________________________  RADIOLOGY  I have personally reviewed the images performed during this visit and  I agree with the Radiologist's read.   Interpretation by Radiologist:  DG Chest Portable 1 View  Result Date: 07/28/2020 CLINICAL DATA:  Chest pain EXAM: PORTABLE CHEST 1 VIEW COMPARISON:  03/11/2020 FINDINGS: Unchanged mild cardiomegaly. Both lungs are clear. The visualized skeletal structures are unremarkable. IMPRESSION: Unchanged mild cardiomegaly without acute airspace disease. Electronically Signed   By: Deatra Robinson M.D.   On: 07/28/2020 03:57   CT Angio Chest/Abd/Pel for Dissection W and/or Wo Contrast  Result Date: 07/28/2020 CLINICAL DATA:  Chest pain radiating to both arms in the back, rule out aortic dissection EXAM: CT ANGIOGRAPHY CHEST, ABDOMEN AND PELVIS TECHNIQUE: Non-contrast CT of the chest was initially obtained. Multidetector CT imaging through the chest, abdomen and pelvis was performed using the standard protocol during bolus administration of intravenous contrast. Multiplanar reconstructed images and MIPs were obtained and reviewed to evaluate the vascular anatomy. CONTRAST:  OMNIPAQUE IOHEXOL 350 MG/ML SOLN COMPARISON:  Abdomen and pelvis CT 11/30/2018. Chest CTA 08/09/2018 FINDINGS: CTA CHEST FINDINGS Cardiovascular: Preferential opacification of the thoracic aorta. No evidence of thoracic aortic aneurysm or dissection. Enlarged heart size. No pericardial effusion. Large main pulmonary artery (36 mm in diameter) suggesting pulmonary hypertension. Mediastinum/Nodes: Nodular thyroid including left-sided nodule measuring 2.1 cm, also present on prior based on left lobe size previously. Lungs/Pleura: Bilateral atelectasis. Medial segment right middle lobe scarring. There is no edema, consolidation, effusion, or pneumothorax. Musculoskeletal: Thoracic spondylosis/disc degeneration. No acute finding Review of the MIP images confirms the above findings. CTA ABDOMEN AND PELVIS FINDINGS VASCULAR Aorta: Atheromatous plaque.  No dissection or aneurysm Celiac: Unremarkable SMA:  Unremarkable Renals: Single bilateral renal artery with smooth contour and wide patency IMA: Patent Inflow: Scattered calcified atheromatous calcification. No stenosis or dissection Veins: Unremarkable  in the arterial phase Review of the MIP images confirms the above findings. NON-VASCULAR Hepatobiliary: No focal liver abnormality.No evidence of biliary obstruction or stone. Pancreas: Unremarkable. Spleen: Unremarkable. Adrenals/Urinary Tract: Negative adrenals. No hydronephrosis or stone. Unremarkable bladder. Stomach/Bowel:  No obstruction. No appendicitis. Vascular/Lymphatic: No acute vascular abnormality. No mass or adenopathy. Reproductive:Pessary in place Other: No ascites or pneumoperitoneum. Fatty epigastric and umbilical hernias in the midline. Musculoskeletal: No acute abnormalities. Lumbar spine degeneration with L3-4 anterolisthesis. L4-5 solid arthrodesis. Advanced L5-S1 disc degeneration with biforaminal narrowing. Review of the MIP images confirms the above findings. IMPRESSION: 1. No acute finding.  No sign of acute aortic syndrome. 2. Finding of pulmonary hypertension. 3.  Aortic Atherosclerosis (ICD10-I70.0). 4. Fatty epigastric and umbilical hernias. 5. 2.1 cm left thyroid nodule. Recommend thyroid US (ref: J Am Coll Radiol. 2015 Feb;12(2): 143-50). Electronically Signed   By: Marnee Spring M.D.   On: 07/28/2020 06:03      ____________________________________________   PROCEDURES  Procedure(s) performed:yes .1-3 Lead EKG Interpretation Performed by: Nita Sickle, MD Authorized by: Nita Sickle, MD     Interpretation: non-specific     ECG rate assessment: normal     Rhythm: sinus rhythm     Ectopy: none     Conduction: normal     Critical Care performed: yes  CRITICAL CARE Performed by: Nita Sickle  ?  Total critical care time: 30 min  Critical care time was exclusive of separately billable procedures and treating other patients.  Critical care  was necessary to treat or prevent imminent or life-threatening deterioration.  Critical care was time spent personally by me on the following activities: development of treatment plan with patient and/or surrogate as well as nursing, discussions with consultants, evaluation of patient's response to treatment, examination of patient, obtaining history from patient or surrogate, ordering and performing treatments and interventions, ordering and review of laboratory studies, ordering and review of radiographic studies, pulse oximetry and re-evaluation of patient's condition.  ____________________________________________   INITIAL IMPRESSION / ASSESSMENT AND PLAN / ED COURSE   60 y.o. female history of COPD and asthma on 2 L nasal cannula, OSA on CPAP, CAD, bilateral leg edema, diabetes, VSD status postrepair, hypertension, hyperlipidemia who presents for evaluation of sharp chest pain radiating to between the shoulder blades and b/l arms associated with mild worsening of chronic cough and chronic SOB.  Patient is not in any distress, has normal work of breathing, normal sats on her baseline 2 L, lungs are clear to auscultation with no wheezing, crackles, and good air movement.  She has trace pitting edema but is symmetric bilaterally.  EKG with no signs of acute ischemia or dysrhythmias.   Ddx ACS, radicular pain, tendinitis, PNA, PE, dissection, pleurisy, pancreatitis, MSK, CHF. Full painless ROM of the joints with no signs of inflammatory/ septic joint. No midline cs-pine tenderness, no neuro deficits, strong equal pulses x 4.   Plan for imaging, labs including d-dimer, CXR.  Patient placed on telemetry for close monitoring of cardiorespiratory status.  Old medical records reviewed.  _________________________ 6:13 AM on 07/28/2020 -----------------------------------------  To high-sensitivity troponin negative, no signs of CHF exacerbation with no edema and a normal BNP.  No signs of pericarditis  or myocarditis.  No signs of pneumonia on imaging, PE or dissection.  Lipase slightly elevated but CT negative for pancreatitis.  Mild hypoglycemia which corrected after D50 and p.o. intake.  Negative procalcitonin making infection less likely.  At this time this is most likely neuropathy versus radicular  pain versus MSK.  Will discharge home on Tylenol 1000 mg 3 times daily and follow-up with PCP.  Discussed my standard return precautions.  We did talk about reducing her insulin amount if she has decreased appetite to due to concerns of hypoglycemia     _____________________________________________ Please note:  Patient was evaluated in Emergency Department today for the symptoms described in the history of present illness. Patient was evaluated in the context of the global COVID-19 pandemic, which necessitated consideration that the patient might be at risk for infection with the SARS-CoV-2 virus that causes COVID-19. Institutional protocols and algorithms that pertain to the evaluation of patients at risk for COVID-19 are in a state of rapid change based on information released by regulatory bodies including the CDC and federal and state organizations. These policies and algorithms were followed during the patient's care in the ED.  Some ED evaluations and interventions may be delayed as a result of limited staffing during the pandemic.   [place] Controlled Substance Database was reviewed by me. ____________________________________________   FINAL CLINICAL IMPRESSION(S) / ED DIAGNOSES   Final diagnoses:  Atypical chest pain  Hypoglycemia      NEW MEDICATIONS STARTED DURING THIS VISIT:  ED Discharge Orders    None       Note:  This document was prepared using Dragon voice recognition software and may include unintentional dictation errors.    Don Perking, [place], MD 07/28/20 [phone]

## 2020-07-28 NOTE — ED Notes (Signed)
Pt CBG 40 following oral glucose. Verbal order for dextrose 50 per Don Perking MD.

## 2020-09-16 DIAGNOSIS — I700 Atherosclerosis of aorta: Secondary | ICD-10-CM | POA: Insufficient documentation

## 2020-10-22 ENCOUNTER — Ambulatory Visit: Payer: Medicare Other | Admitting: Podiatry

## 2021-01-05 ENCOUNTER — Other Ambulatory Visit: Payer: Self-pay | Admitting: Internal Medicine

## 2021-01-05 DIAGNOSIS — Z1231 Encounter for screening mammogram for malignant neoplasm of breast: Secondary | ICD-10-CM

## 2021-02-11 ENCOUNTER — Ambulatory Visit (INDEPENDENT_AMBULATORY_CARE_PROVIDER_SITE_OTHER): Payer: 59 | Admitting: Podiatry

## 2021-02-11 ENCOUNTER — Encounter (INDEPENDENT_AMBULATORY_CARE_PROVIDER_SITE_OTHER): Payer: Self-pay

## 2021-02-11 ENCOUNTER — Other Ambulatory Visit: Payer: Self-pay

## 2021-02-11 ENCOUNTER — Encounter: Payer: Self-pay | Admitting: Podiatry

## 2021-02-11 DIAGNOSIS — B351 Tinea unguium: Secondary | ICD-10-CM | POA: Diagnosis not present

## 2021-02-11 DIAGNOSIS — E1142 Type 2 diabetes mellitus with diabetic polyneuropathy: Secondary | ICD-10-CM

## 2021-02-11 DIAGNOSIS — M79675 Pain in left toe(s): Secondary | ICD-10-CM

## 2021-02-11 DIAGNOSIS — M79674 Pain in right toe(s): Secondary | ICD-10-CM | POA: Diagnosis not present

## 2021-02-11 NOTE — Progress Notes (Signed)

## 2021-04-15 ENCOUNTER — Other Ambulatory Visit: Payer: Self-pay

## 2021-04-15 ENCOUNTER — Encounter: Payer: Self-pay | Admitting: Emergency Medicine

## 2021-04-15 ENCOUNTER — Emergency Department
Admission: EM | Admit: 2021-04-15 | Discharge: 2021-04-15 | Disposition: A | Discharge status: Home / Self Care | Payer: 59 | Attending: Emergency Medicine | Admitting: Emergency Medicine

## 2021-04-15 DIAGNOSIS — E119 Type 2 diabetes mellitus without complications: Secondary | ICD-10-CM | POA: Insufficient documentation

## 2021-04-15 DIAGNOSIS — N390 Urinary tract infection, site not specified: Secondary | ICD-10-CM

## 2021-04-15 DIAGNOSIS — R300 Dysuria: Secondary | ICD-10-CM | POA: Diagnosis present

## 2021-04-15 MED ORDER — CIPROFLOXACIN HCL 500 MG PO TABS: 500.0000 mg | ORAL_TABLET | Freq: Two times a day (BID) | ORAL | 0 refills | Status: AC

## 2021-04-15 NOTE — ED Provider Triage Note (Signed)
Emergency Medicine Provider Triage Evaluation Note  Teresa [name] , a 61 y.o. female  was evaluated in triage.  Pt complains of dysuria, questionable UTI.  Review of Systems  Positive: Burning with urination, hesitancy, frequency Negative: Abdominal pain, back pain  Physical Exam  BP (!) 159/72 (BP Location: Left Arm)    Pulse (!) 102    Temp 98.9 F (37.2 C) (Oral)    Resp 17    Ht 5\' 2"  (1.575 m)    Wt 113.4 kg    SpO2 93%    BMI 45.73 kg/m  Gen:   Awake, no distress   Resp:  Normal effort  MSK:   Moves extremities without difficulty  Other:    Medical Decision Making  Medically screening exam initiated at 11:35 AM.  Appropriate orders placed.  was informed that the remainder of the evaluation will be completed by another provider, this initial triage assessment does not replace that evaluation, and the importance of remaining in the ED until their evaluation is complete.     [name], PA-C 04/15/21 1135

## 2021-04-15 NOTE — ED Triage Notes (Signed)
Pt comes into the ED via POV c/o dysuria that started last week.  Pt states that when she wipes after urinating she sees some pink-tinges on the toilet paper.  Pt denies any abd pain with the dysuria, and just states the burning wont go away.

## 2021-04-15 NOTE — ED Provider Notes (Signed)
° °  [facility] Provider Note    Event Date/Time   First MD Initiated Contact with Patient 04/15/21 1156     (approximate)   History   Dysuria   HPI  Teresa [name] is a 61 y.o. female who presents with complaints of dysuria.  Patient reports burning with urination over the last 3 to 4 days.  She denies flank pain.  No fevers chills nausea or vomiting.  Has not taken anything for this.  Denies vaginal discharge     Physical Exam   Triage Vital Signs: ED Triage Vitals [04/15/21 1133]  Enc Vitals Group     BP (!) 159/72     Pulse Rate (!) 102     Resp 17     Temp 98.9 F (37.2 C)     Temp Source Oral     SpO2 93 %     Weight 113.4 kg (250 lb)     Height 1.575 m (5\' 2" )     Head Circumference      Peak Flow      Pain Score 10     Pain Loc      Pain Edu?      Excl. in [place]?     Most recent vital signs: Vitals:   04/15/21 1133  BP: (!) 159/72  Pulse: (!) 102  Resp: 17  Temp: 98.9 F (37.2 C)  SpO2: 93%     General: Awake, no distress.  CV:  Good peripheral perfusion.  Resp:  Normal effort.  Abd:  No distention.  No CVA tenderness    ED Results / Procedures / Treatments   Labs (all labs ordered are listed, but only abnormal results are displayed) Labs Reviewed  URINALYSIS, ROUTINE W REFLEX MICROSCOPIC     EKG     RADIOLOGY     PROCEDURES:  Critical Care performed:   Procedures   MEDICATIONS ORDERED IN ED: Medications - No data to display   IMPRESSION / MDM / [place] / ED COURSE  I reviewed the triage vital signs and the nursing notes.  Patient presents with dysuria times several days as described above, she does have a history of diabetes.  Overall she is well-appearing and in no acute distress.  No CVA tenderness.  We will start the patient on ciprofloxacin 500 mg 2 times per day  Recommend outpatient follow-up, no indication for admission at this time      FINAL CLINICAL IMPRESSION(S) /  ED DIAGNOSES   Final diagnoses:  Lower urinary tract infectious disease     Rx / DC Orders   ED Discharge Orders          Ordered    ciprofloxacin (CIPRO) 500 MG tablet  2 times daily        04/15/21 1144             Note:  This document was prepared using Dragon voice recognition software and may include unintentional dictation errors.   Lavonia Drafts, MD 04/15/21 [phone]

## 2021-05-05 ENCOUNTER — Other Ambulatory Visit: Payer: Self-pay | Admitting: Physician Assistant

## 2021-05-05 DIAGNOSIS — M5412 Radiculopathy, cervical region: Secondary | ICD-10-CM

## 2021-05-13 ENCOUNTER — Encounter: Payer: Self-pay | Admitting: Podiatry

## 2021-05-13 ENCOUNTER — Ambulatory Visit (INDEPENDENT_AMBULATORY_CARE_PROVIDER_SITE_OTHER): Payer: 59 | Admitting: Podiatry

## 2021-05-13 ENCOUNTER — Other Ambulatory Visit: Payer: Self-pay

## 2021-05-13 DIAGNOSIS — M79675 Pain in left toe(s): Secondary | ICD-10-CM

## 2021-05-13 DIAGNOSIS — E1142 Type 2 diabetes mellitus with diabetic polyneuropathy: Secondary | ICD-10-CM | POA: Diagnosis not present

## 2021-05-13 DIAGNOSIS — M79674 Pain in right toe(s): Secondary | ICD-10-CM | POA: Diagnosis not present

## 2021-05-13 DIAGNOSIS — B351 Tinea unguium: Secondary | ICD-10-CM | POA: Diagnosis not present

## 2021-05-13 NOTE — Progress Notes (Signed)

## 2021-05-19 ENCOUNTER — Other Ambulatory Visit: Payer: Self-pay

## 2021-05-19 ENCOUNTER — Ambulatory Visit
Admission: RE | Admit: 2021-05-19 | Discharge: 2021-05-19 | Disposition: A | Discharge status: Home / Self Care | Payer: 59 | Source: Ambulatory Visit | Attending: Physician Assistant | Admitting: Physician Assistant

## 2021-05-19 DIAGNOSIS — M5412 Radiculopathy, cervical region: Secondary | ICD-10-CM | POA: Diagnosis present

## 2021-05-19 IMAGING — MR MR CERVICAL SPINE W/O CM
5 series · 38 of 48 positions shown · non-contrast
Comparison: None.

CLINICAL DATA: Cervical radiculitis [61] ([61]-CM).

EXAM:
MRI CERVICAL SPINE WITHOUT CONTRAST
TECHNIQUE: Multiplanar, multisequence MR imaging of the cervical spine was
performed. No intravenous contrast was administered.

[Series 5: T2 · sagittal · 3.0mm · 0.62mm/px · 6 of 15 slices shown (1 of 2)]
[im 1/15]
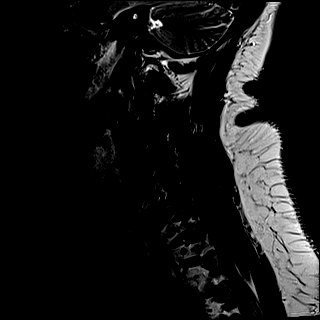
[im 3/15]
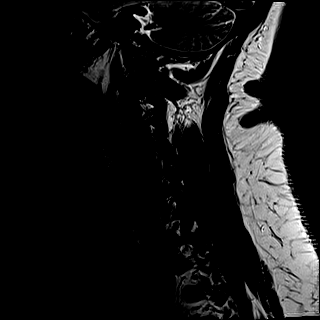
[im 6/15]
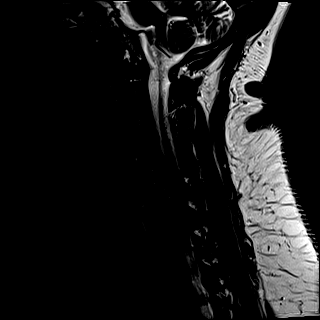
[im 9/15]
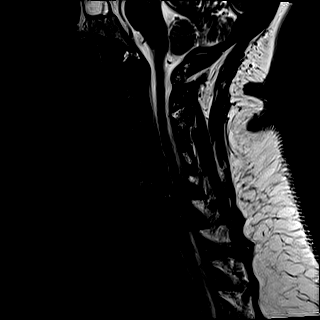
[im 12/15]
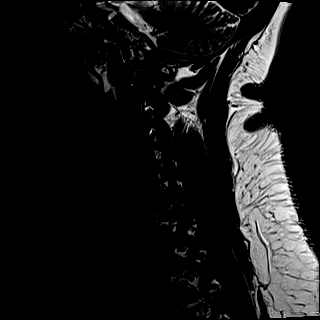
[im 15/15]
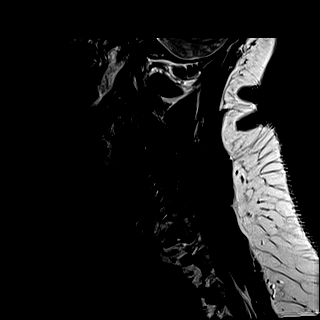

[Series 6: FLAIR · sagittal · 3.0mm · 0.78mm/px · 7 of 15 slices shown]
[im 1/15]
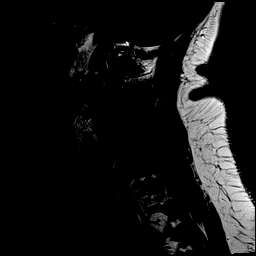
[im 3/15]
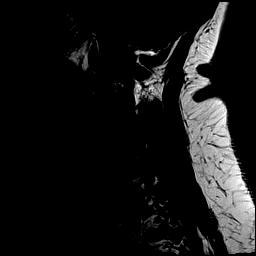
[im 5/15]
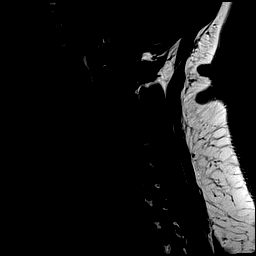
[im 8/15]
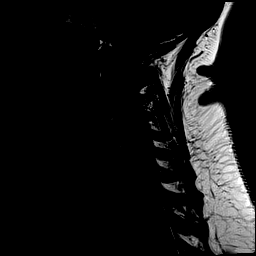
[im 10/15]
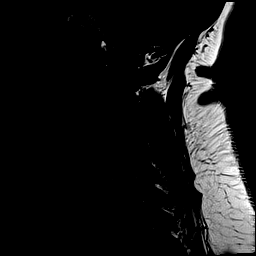
[im 12/15]
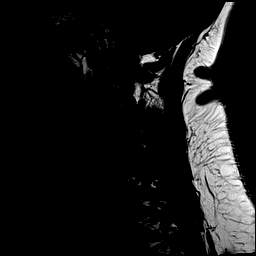
[im 15/15]
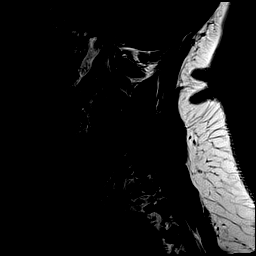

[Series 7: STIR · sagittal · 3.0mm · 0.62mm/px · 7 of 15 slices shown]
[im 1/15]
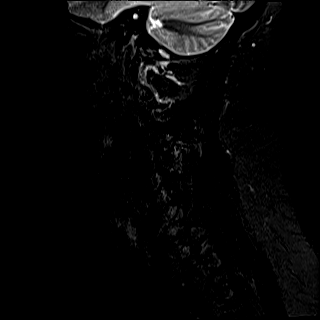
[im 3/15]
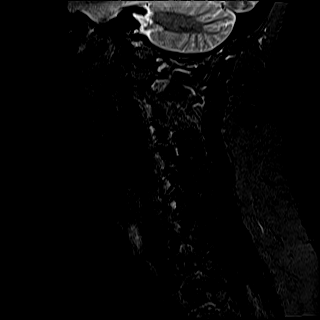
[im 5/15]
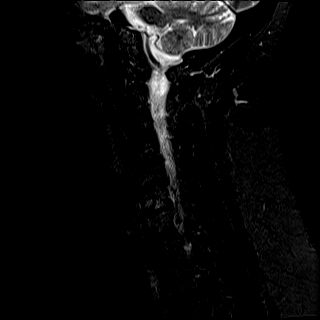
[im 8/15]
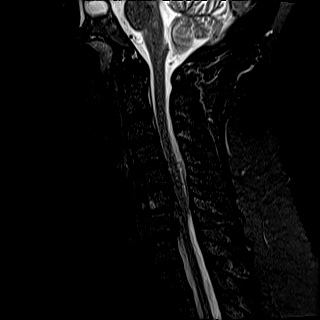
[im 10/15]
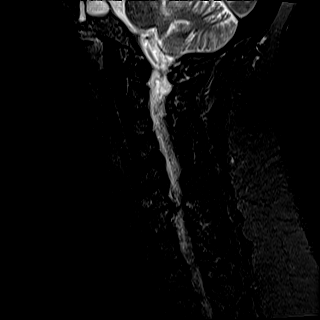
[im 12/15]
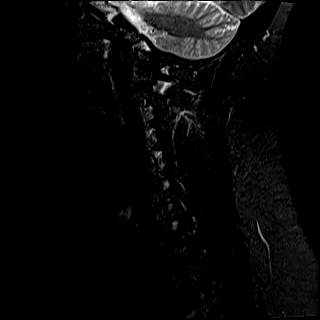
[im 15/15]
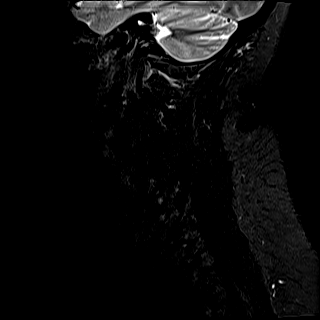

[Series 8: T2 · axial · 3.0mm · 0.70mm/px · z∈[-43,+54]mm · 10 of 29 slices shown (2 of 2)]
[im 1/29]
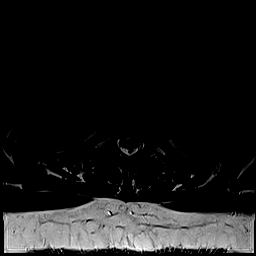
[im 3/29]
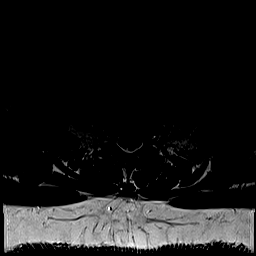
[im 5/29]
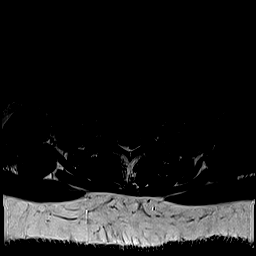
[im 7/29]
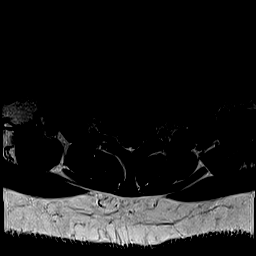
[im 9/29]
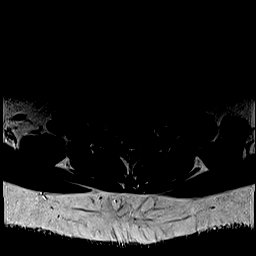
[im 13/29]
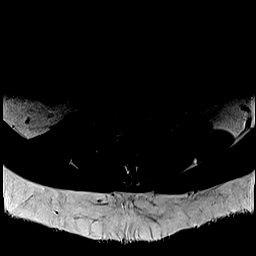
[im 16/29]
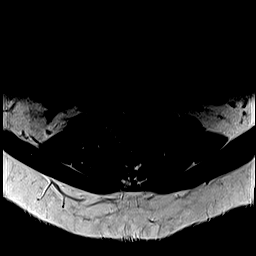
[im 20/29]
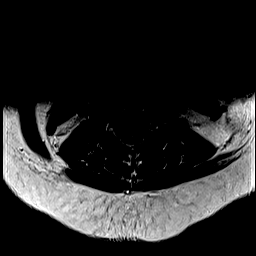
[im 24/29]
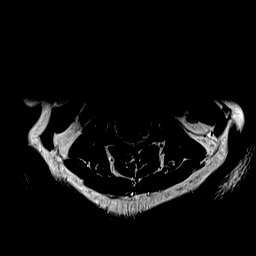
[im 29/29]
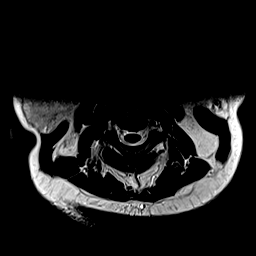

[Series 9: ax mpgr · axial · 3.0mm · 0.35mm/px · z∈[-43,+54]mm · 8 of 29 slices shown]
[im 1/29]
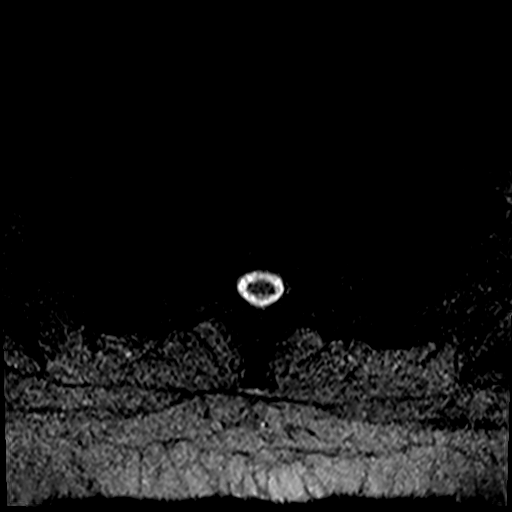
[im 5/29]
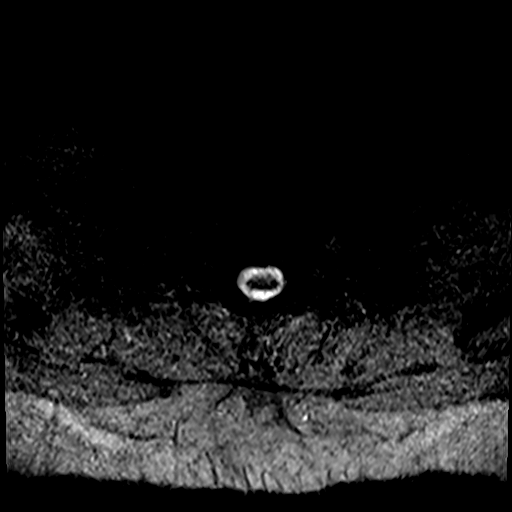
[im 9/29]
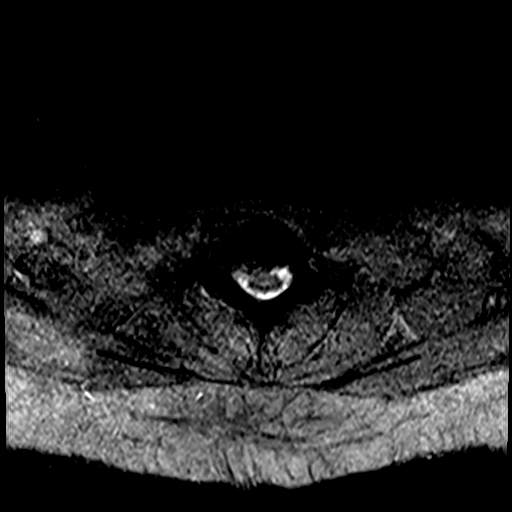
[im 13/29]
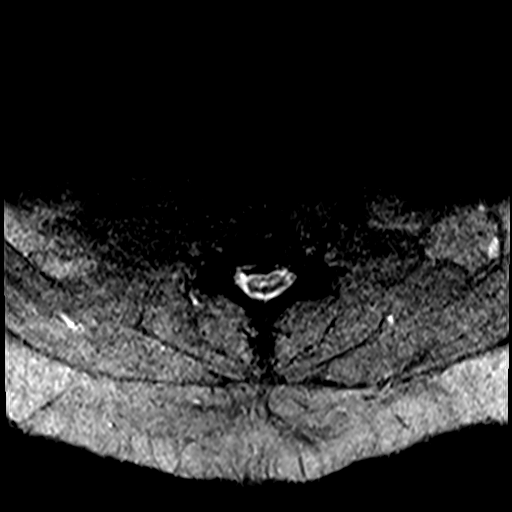
[im 16/29]
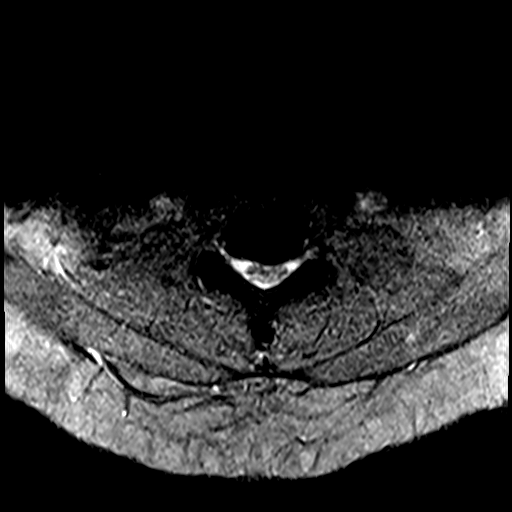
[im 20/29]
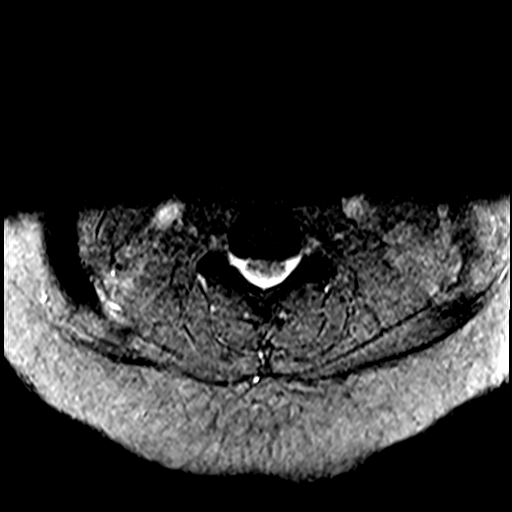
[im 24/29]
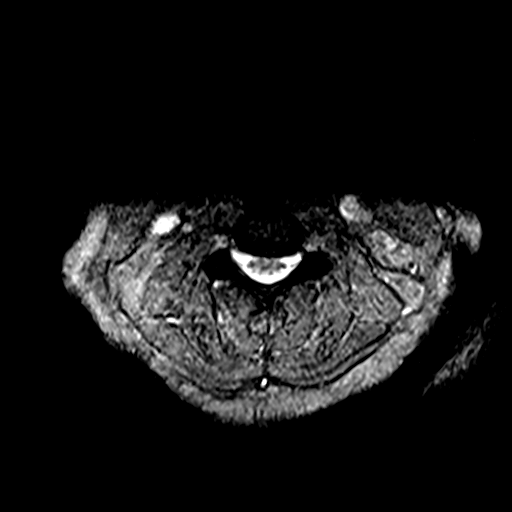
[im 29/29]
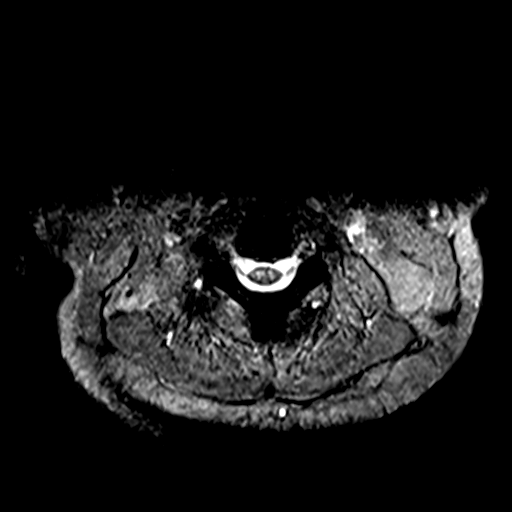

[38 of 48 positions shown; findings below may reference images not displayed]

FINDINGS: Alignment: Straightening of the cervical curvature.

Vertebrae: No fracture, evidence of discitis, or bone lesion.
Endplate degenerative changes at C5-6 and C6-7.

Cord: Mass effect on the cord at C5-6 and more pronounced at C6-7.
No clear cord signal abnormality.

Posterior Fossa, vertebral arteries, paraspinal tissues: Partial
empty sella incidentally noted.

Disc levels:

C2-3: Facet degenerative changes. No significant spinal canal or
neural foraminal stenosis.

C3-4: Small posterior disc protrusion without significant spinal
canal stenosis. Mild facet degenerative change without significant
neural foraminal narrowing.

C4-5: Posterior disc osteophyte complex without significant spinal
canal stenosis. Uncovertebral and facet degenerative changes without
significant neural foraminal narrowing.

C5-6: Posterior disc osteophyte complex asymmetric to the left
resulting in mild spinal canal stenosis with mild mass effect on the
cord. Uncovertebral and facet degenerative changes resulting in mild
bilateral neural foraminal narrowing.

C6-7: Posterior disc osteophyte complex with superimposed inferiorly
migrating right paracentral disc extrusion resulting in moderate
spinal canal stenosis mass effect on the cord. Uncovertebral and
facet degenerative changes contribute for severe right and mild left
neural foraminal narrowing.

C7-T1: Posterior disc protrusion resulting in mild spinal canal
stenosis. Facet degenerative changes without significant neural
foraminal narrowing.
IMPRESSION: Degenerative changes of the cervical spine more pronounced at C6-7
where there is superimposed right paracentral disc extrusion with
moderate spinal canal stenosis and mass effect on the cord.

## 2021-08-16 ENCOUNTER — Ambulatory Visit: Payer: 59 | Admitting: Podiatry

## 2021-08-26 ENCOUNTER — Other Ambulatory Visit: Payer: Self-pay | Admitting: Neurosurgery

## 2021-08-26 DIAGNOSIS — G8929 Other chronic pain: Secondary | ICD-10-CM

## 2021-09-10 ENCOUNTER — Other Ambulatory Visit: Payer: 59

## 2021-09-17 ENCOUNTER — Ambulatory Visit
Admission: RE | Admit: 2021-09-17 | Discharge: 2021-09-17 | Disposition: A | Discharge status: Home / Self Care | Payer: 59 | Source: Ambulatory Visit | Attending: Neurosurgery | Admitting: Neurosurgery

## 2021-09-17 DIAGNOSIS — G8929 Other chronic pain: Secondary | ICD-10-CM | POA: Diagnosis present

## 2021-09-17 DIAGNOSIS — M25512 Pain in left shoulder: Secondary | ICD-10-CM | POA: Insufficient documentation

## 2021-09-17 IMAGING — MR MR SHOULDER*L* W/O CM
4 of 5 series · 31 of 40 positions shown · non-contrast
Comparison: None Available.

CLINICAL DATA: Painful range of motion. Left shoulder numbness for
few months.

EXAM:
MRI OF THE LEFT SHOULDER WITHOUT CONTRAST
TECHNIQUE: Multiplanar, multisequence MR imaging of the shoulder was performed.
No intravenous contrast was administered.

[Series 8: T2 fat-sat · oblique · left · 4.0mm · 0.44mm/px · 8 of 26 slices shown (1 of 3)]
[im 1/26]
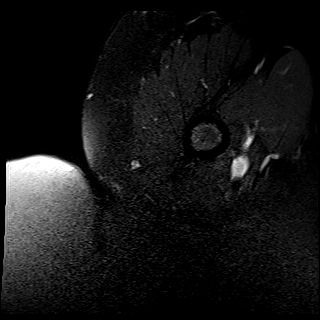
[im 4/26]
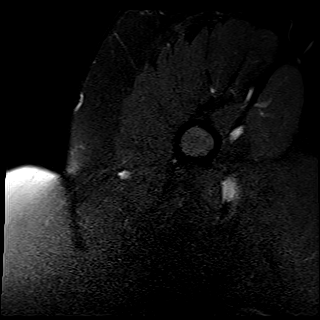
[im 8/26]
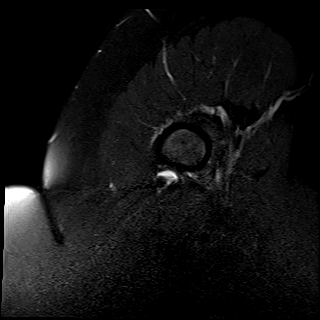
[im 11/26]
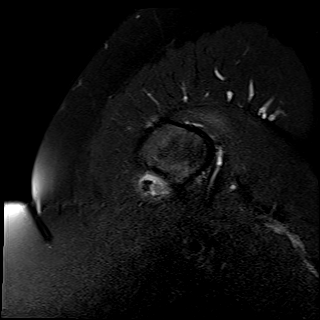
[im 15/26]
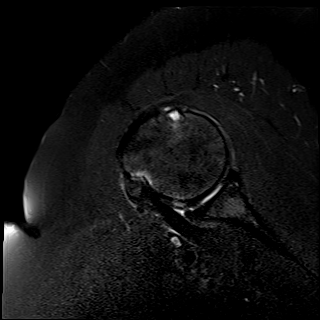
[im 18/26]
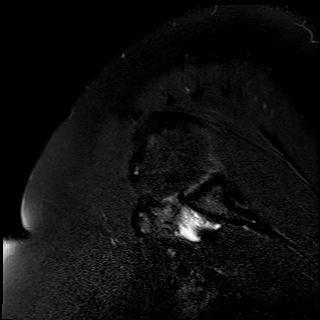
[im 22/26]
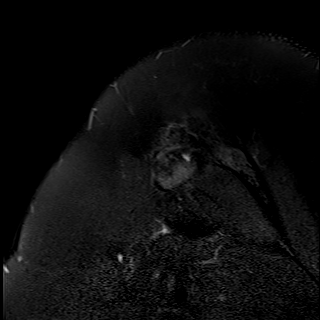
[im 26/26]
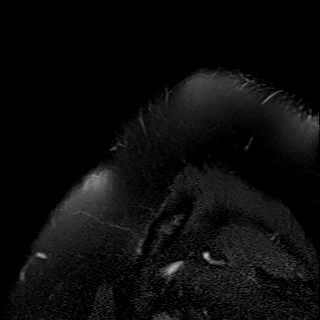

[Series 9: PD · oblique · left · 4.0mm · 0.44mm/px · 9 of 26 slices shown]
[im 1/26]
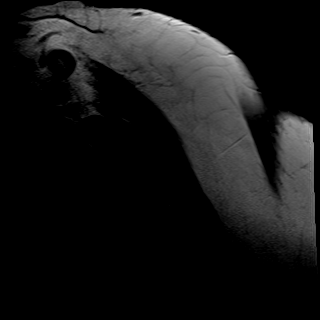
[im 4/26]
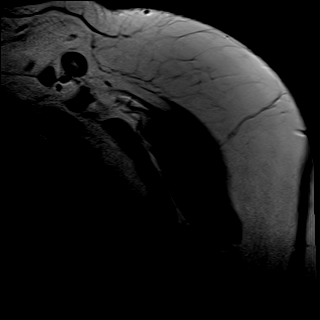
[im 7/26]
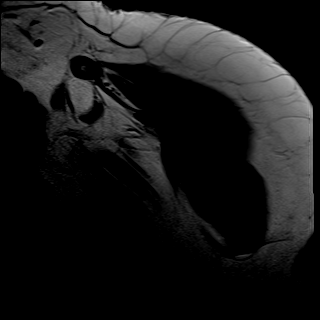
[im 10/26]
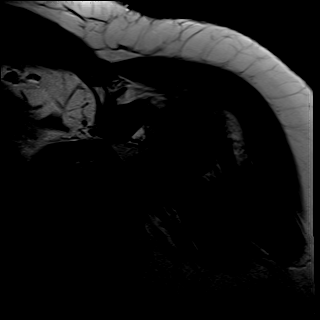
[im 13/26]
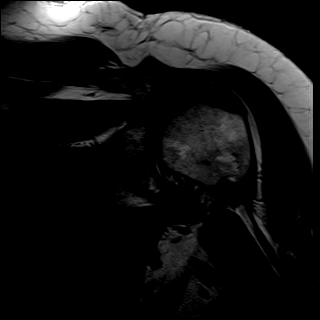
[im 16/26]
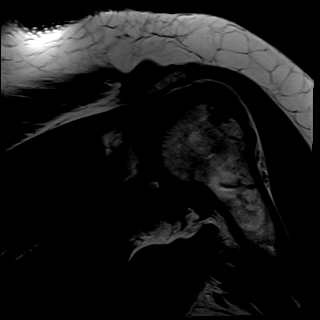
[im 19/26]
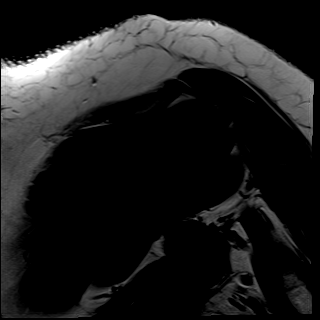
[im 22/26]
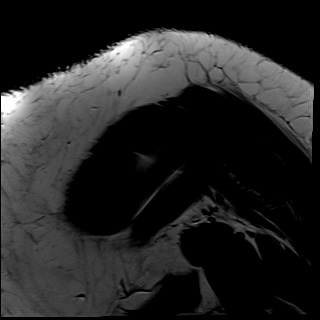
[im 26/26]
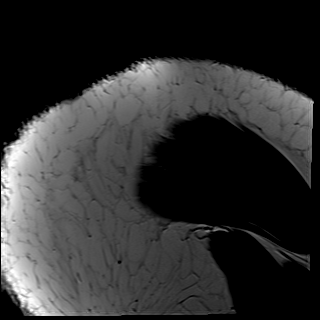

[Series 10: T2 fat-sat · oblique · left · 4.0mm · 0.44mm/px · 9 of 26 slices shown (2 of 3)]
[im 1/26]
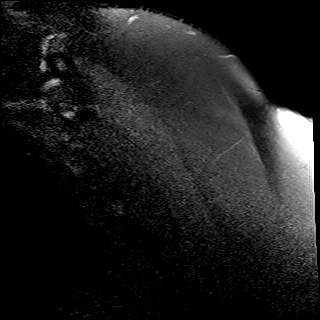
[im 4/26]
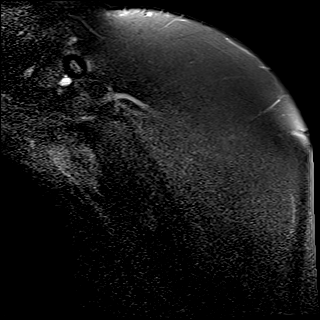
[im 7/26]
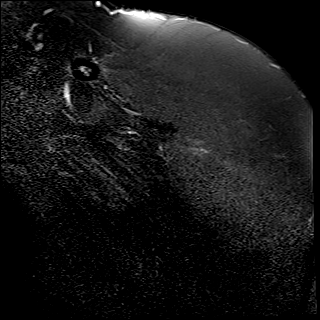
[im 10/26]
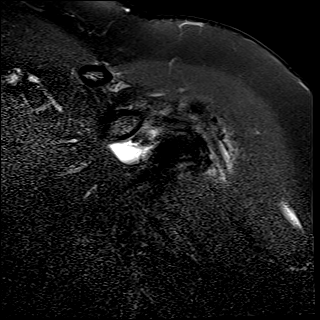
[im 13/26]
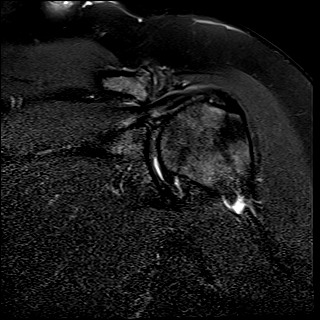
[im 16/26]
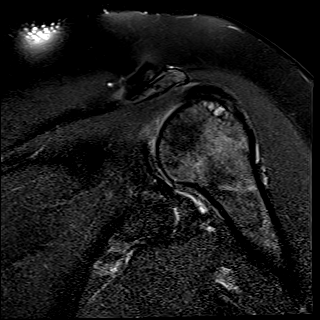
[im 19/26]
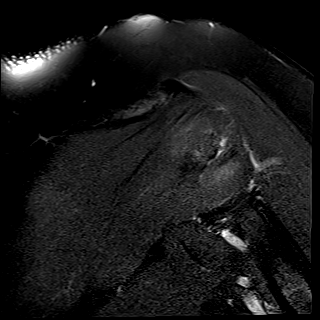
[im 22/26]
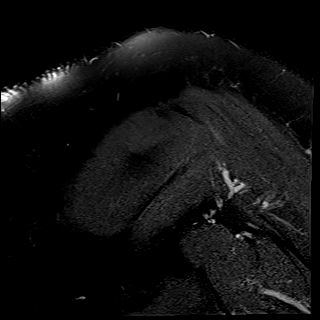
[im 26/26]
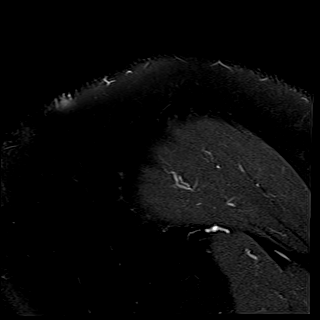

[Series 11: T2 fat-sat · oblique · left · 4.0mm · 0.22mm/px · 5 of 22 slices shown (3 of 3)]
[im 1/22]
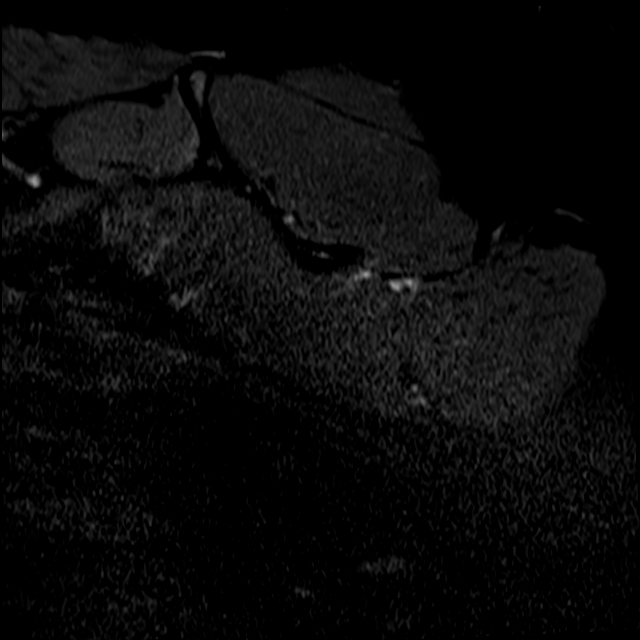
[im 4/22]
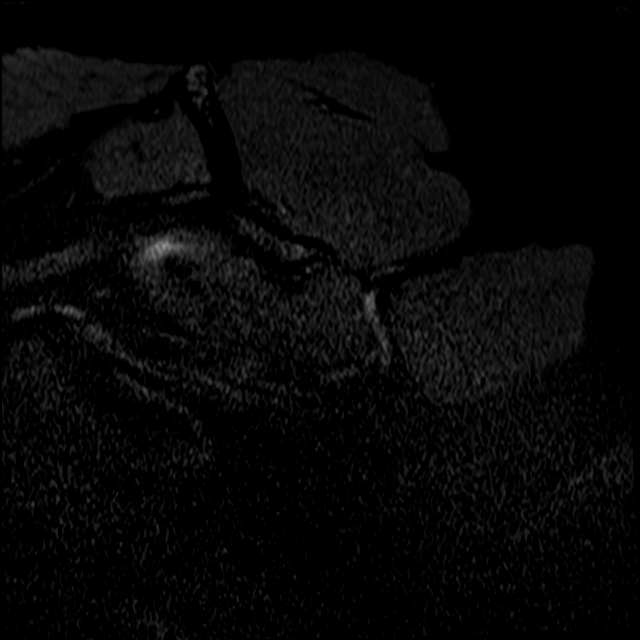
[im 8/22]
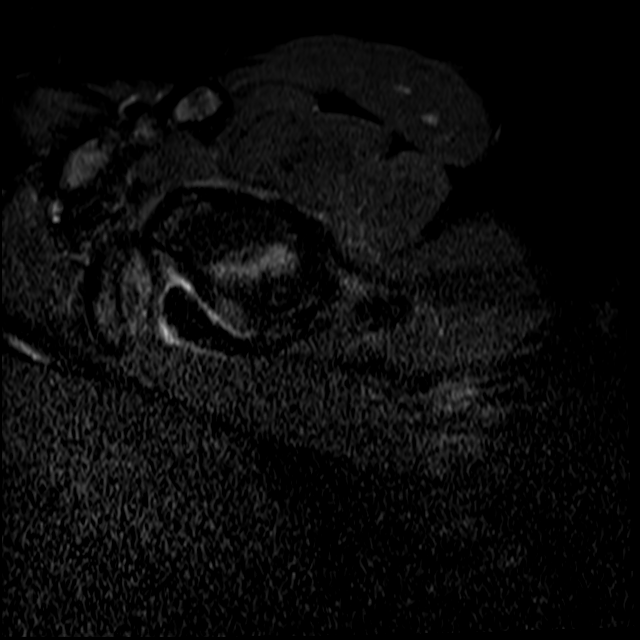
[im 11/22]
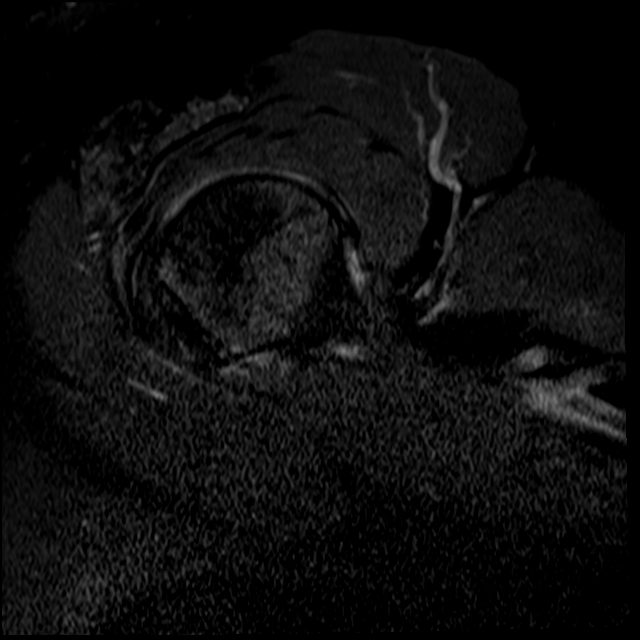
[im 18/22]
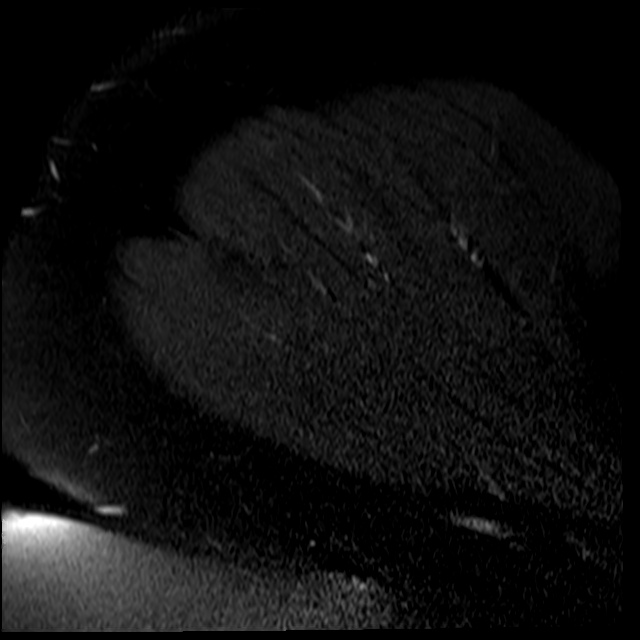

[31 of 40 positions shown; findings below may reference images not displayed]

FINDINGS: Rotator cuff: Moderate tendinosis of the supraspinatus tendon with a
tiny insertional interstitial tear. Mild tendinosis of the
infraspinatus tendon. Teres minor tendon is intact. Subscapularis
tendon is intact.

Muscles: No muscle atrophy or edema. No intramuscular fluid
collection or hematoma.

Biceps Long Head: Severe tendinosis of the intra-articular portion
and proximal extra-articular portion of the long head of the biceps
tendon. 10 mm loose body in the long head of the biceps tendon
sheath.

Acromioclavicular Joint: Mild arthropathy of the acromioclavicular
joint. No subacromial/subdeltoid bursal fluid.

Glenohumeral Joint: No joint effusion. High-grade partial-thickness
cartilage loss with small areas of full-thickness cartilage loss of
the glenohumeral joint.

Labrum: Superior labral degeneration.

Bones: No fracture or dislocation. No aggressive osseous lesion.
Mild subcortical reactive marrow changes at the infraspinatus
insertion.

Other: No fluid collection or hematoma.
IMPRESSION: 1. Moderate tendinosis of the supraspinatus tendon with a tiny
insertional interstitial tear.
2. Mild tendinosis of the infraspinatus tendon.
3. Severe tendinosis of the intra-articular portion and proximal
extra-articular portion of the long head of the biceps tendon. 10 mm
loose body in the long head of the biceps tendon sheath.
4. Moderate osteoarthritis of the glenohumeral joint.

## 2021-11-02 ENCOUNTER — Emergency Department: Payer: 59

## 2021-11-02 ENCOUNTER — Other Ambulatory Visit: Payer: Self-pay

## 2021-11-02 ENCOUNTER — Emergency Department
Admission: EM | Admit: 2021-11-02 | Discharge: 2021-11-02 | Disposition: A | Discharge status: Home / Self Care | Payer: 59 | Attending: Emergency Medicine | Admitting: Emergency Medicine

## 2021-11-02 ENCOUNTER — Encounter: Payer: Self-pay | Admitting: Emergency Medicine

## 2021-11-02 DIAGNOSIS — I10 Essential (primary) hypertension: Secondary | ICD-10-CM | POA: Insufficient documentation

## 2021-11-02 DIAGNOSIS — K859 Acute pancreatitis without necrosis or infection, unspecified: Secondary | ICD-10-CM | POA: Insufficient documentation

## 2021-11-02 DIAGNOSIS — R1013 Epigastric pain: Secondary | ICD-10-CM

## 2021-11-02 DIAGNOSIS — R748 Abnormal levels of other serum enzymes: Secondary | ICD-10-CM | POA: Diagnosis not present

## 2021-11-02 DIAGNOSIS — E860 Dehydration: Secondary | ICD-10-CM | POA: Diagnosis not present

## 2021-11-02 DIAGNOSIS — R1084 Generalized abdominal pain: Secondary | ICD-10-CM | POA: Diagnosis present

## 2021-11-02 LAB — URINALYSIS, ROUTINE W REFLEX MICROSCOPIC
Bilirubin Urine: NEGATIVE
Glucose, UA: NEGATIVE mg/dL
Hgb urine dipstick: NEGATIVE
Ketones, ur: NEGATIVE mg/dL
Leukocytes,Ua: NEGATIVE
Nitrite: NEGATIVE
Protein, ur: 100 mg/dL — AB
Specific Gravity, Urine: >1.046 — ABNORMAL HIGH (ref 1.005–1.030)
pH: 5 (ref 5.0–8.0)

## 2021-11-02 LAB — COMPREHENSIVE METABOLIC PANEL
ALT: 14 U/L (ref 0–44)
AST: 19 U/L (ref 15–41)
Albumin: 3.8 g/dL (ref 3.5–5.0)
Alkaline Phosphatase: 59 U/L (ref 38–126)
Anion gap: 11 (ref 5–15)
BUN: 8 mg/dL (ref 8–23)
CO2: 22 mmol/L (ref 22–32)
Calcium: 9.2 mg/dL (ref 8.9–10.3)
Chloride: 107 mmol/L (ref 98–111)
Creatinine, Ser: 0.87 mg/dL (ref 0.44–1.00)
GFR, Estimated: >60 mL/min (ref >60)
Glucose, Bld: 81 mg/dL (ref 70–99)
Potassium: 3.9 mmol/L (ref 3.5–5.1)
Sodium: 140 mmol/L (ref 135–145)
Total Bilirubin: 0.5 mg/dL (ref 0.3–1.2)
Total Protein: 7.2 g/dL (ref 6.5–8.1)

## 2021-11-02 LAB — CBC
HCT: 42.1 % (ref 36.0–46.0)
Hemoglobin: 12.7 g/dL (ref 12.0–15.0)
MCH: 27.9 pg (ref 26.0–34.0)
MCHC: 30.2 g/dL (ref 30.0–36.0)
MCV: 92.3 fL (ref 80.0–100.0)
Platelets: 322 10*3/uL (ref 150–400)
RBC: 4.56 MIL/uL (ref 3.87–5.11)
RDW: 13.9 % (ref 11.5–15.5)
WBC: 9.2 10*3/uL (ref 4.0–10.5)
nRBC: 0 % (ref 0.0–0.2)

## 2021-11-02 LAB — LIPASE, BLOOD: Lipase: 236 U/L — ABNORMAL HIGH (ref 11–51)

## 2021-11-02 MED ORDER — ONDANSETRON HCL 4 MG/2ML IJ SOLN
4.0000 mg | Freq: Once | INTRAMUSCULAR | Status: AC
Start: 2021-11-02 — End: 2021-11-02
  Administered 2021-11-02: 4 mg via INTRAVENOUS
  Filled 2021-11-02: qty 2

## 2021-11-02 MED ORDER — PANCRELIPASE (LIP-PROT-AMYL) 36000-114000 UNITS PO CPEP: 36000.0000 [IU] | ORAL_CAPSULE | Freq: Three times a day (TID) | ORAL | 0 refills | Status: AC

## 2021-11-02 MED ORDER — OXYCODONE-ACETAMINOPHEN 5-325 MG PO TABS: 1.0000 | ORAL_TABLET | Freq: Four times a day (QID) | ORAL | 0 refills | Status: DC | PRN

## 2021-11-02 MED ORDER — IOHEXOL 300 MG/ML  SOLN
100.0000 mL | Freq: Once | INTRAMUSCULAR | Status: AC | PRN
  Administered 2021-11-02: 100 mL via INTRAVENOUS

## 2021-11-02 MED ORDER — ONDANSETRON 4 MG PO TBDP: 4.0000 mg | ORAL_TABLET | Freq: Three times a day (TID) | ORAL | 0 refills | Status: DC | PRN

## 2021-11-02 MED ORDER — PANCRELIPASE (LIP-PROT-AMYL) 12000-38000 UNITS PO CPEP
24000.0000 [IU] | ORAL_CAPSULE | Freq: Once | ORAL | Status: AC
Start: 2021-11-02 — End: 2021-11-02
  Administered 2021-11-02: 24000 [IU] via ORAL
  Filled 2021-11-02: qty 2

## 2021-11-02 NOTE — ED Provider Notes (Signed)
[facility] Provider Note    Event Date/Time   First MD Initiated Contact with Patient 11/02/21 1136     (approximate)   History   Abdominal Pain   HPI  Teresa [name] is a 61 y.o. female here with diffuse abdominal pain.  The patient states that over the last week or so, she has had progressive, diffuse, cramp-like, abdominal pain.  She states that she ate a [employer] hamburger that was not cooked approximately 2 to 3 weeks ago.  The next week, she developed this diffuse crampy abdominal pain which has persisted.  She has had associated weakness, nausea, poor appetite.  She has had regular bowel movements.  She had intermittent cramp-like abdominal pain.  Seems to be constant throughout the day, but intermittently cramping and worse.  No specific leaving or aggravating factors.  No other complaints.  No fevers or chills.  No recent medication change.  No recent travel.  No recent antibiotic use.     Physical Exam   Triage Vital Signs: ED Triage Vitals  Enc Vitals Group     BP 11/02/21 1101 (!) 143/74     Pulse Rate 11/02/21 1101 89     Resp 11/02/21 1101 16     Temp 11/02/21 1101 98.8 F (37.1 C)     Temp Source 11/02/21 1101 Oral     SpO2 11/02/21 1101 97 %     Weight 11/02/21 1102 230 lb (104.3 kg)     Height 11/02/21 1102 5\' 2"  (1.575 m)     Head Circumference --      Peak Flow --      Pain Score 11/02/21 1102 8     Pain Loc --      Pain Edu? --      Excl. in GC? --     Most recent vital signs: Vitals:   11/02/21 1322 11/02/21 1539  BP: 140/70 138/72  Pulse: 88 88  Resp: 16 18  Temp:  98 F (36.7 C)  SpO2: 98% 98%     General: Awake, no distress.  CV:  Good peripheral perfusion.  Regular rate and rhythm. Resp:  Normal effort.  Lungs clear. Abd:  No distention.  Moderate, diffuse tenderness.  Mild distention.  No guarding or rebound. Other:  Moist mucous membranes.   ED Results / Procedures / Treatments   Labs (all labs  ordered are listed, but only abnormal results are displayed) Labs Reviewed  URINALYSIS, ROUTINE W REFLEX MICROSCOPIC - Abnormal; Notable for the following components:      Result Value   Color, Urine YELLOW (*)    APPearance HAZY (*)    Specific Gravity, Urine >1.046 (*)    Protein, ur 100 (*)    Bacteria, UA RARE (*)    All other components within normal limits  LIPASE, BLOOD - Abnormal; Notable for the following components:   Lipase 236 (*)    All other components within normal limits  CBC  COMPREHENSIVE METABOLIC PANEL     EKG    RADIOLOGY CT abdomen/pelvis: No acute abnormality, stable cardiomegaly   I also independently reviewed and agree with radiologist interpretations.   PROCEDURES:  Critical Care performed: No     MEDICATIONS ORDERED IN ED: Medications  iohexol (OMNIPAQUE) 300 MG/ML solution 100 mL (100 mLs Intravenous Contrast Given 11/02/21 1412)  ondansetron (ZOFRAN) injection 4 mg (4 mg Intravenous Given 11/02/21 1553)  lipase/protease/amylase (CREON) capsule 24,000 Units (24,000 Units Oral Given 11/02/21 1553)  IMPRESSION / MDM / ASSESSMENT AND PLAN / ED COURSE  I reviewed the triage vital signs and the nursing notes.                               The patient is on the cardiac monitor to evaluate for evidence of arrhythmia and/or significant heart rate changes.   Ddx:  Differential includes the following, with pertinent life- or limb-threatening emergencies considered:  Acute on chronic pancreatitis, colitis, diverticulitis, ileus, foodborne illness, IBS  Patient's presentation is most consistent with acute presentation with potential threat to life or bodily function.  MDM:  61 year old female with past medical history of hypertension, hyperlipidemia, chronic hypoxia, obesity, recurrent pancreatitis, here with abdominal pain, diffuse, and intermittent nausea.  Patient is overall nontoxic and well-appearing.  Abdomen is soft and nontender.   She is tolerating p.o.  Differential as above.  CBC shows no leukocytosis or anemia.  CMP shows normal renal function and LFTs.  Lipase is elevated to 36, consistent with acute on chronic pancreatitis.  UA shows dehydration.  She been given fluids.  CT and abdomen pelvis obtained, reviewed, shows no evidence of acute abnormality.  Specifically, no evidence of pseudocyst.  No evidence of obstruction.  Patient tolerating p.o. and feels well in the ED.  Suspect acute on chronic pancreatitis.  When a long discussion regarding symptomatic control and further management.  The patient will be given pancreatic enzyme supplementation, Zofran, and analgesia.  Return precautions given in detail.  She is aware and will follow pancreatitis eating plan.   MEDICATIONS GIVEN IN ED: Medications  iohexol (OMNIPAQUE) 300 MG/ML solution 100 mL (100 mLs Intravenous Contrast Given 11/02/21 1412)  ondansetron (ZOFRAN) injection 4 mg (4 mg Intravenous Given 11/02/21 1553)  lipase/protease/amylase (CREON) capsule 24,000 Units (24,000 Units Oral Given 11/02/21 1553)     Consults:     EMR reviewed       FINAL CLINICAL IMPRESSION(S) / ED DIAGNOSES   Final diagnoses:  Epigastric pain  Acute recurrent pancreatitis     Rx / DC Orders   ED Discharge Orders          Ordered    ondansetron (ZOFRAN-ODT) 4 MG disintegrating tablet  Every 8 hours PRN        11/02/21 1635    oxyCODONE-acetaminophen (PERCOCET) 5-325 MG tablet  Every 6 hours PRN        11/02/21 1635    lipase/protease/amylase (CREON) 36000 UNITS CPEP capsule  3 times daily before meals        11/02/21 1635             Note:  This document was prepared using Dragon voice recognition software and may include unintentional dictation errors.   Shaune Pollack, MD 11/02/21 [phone]

## 2021-11-02 NOTE — ED Triage Notes (Signed)
Pt here with generalized abd pain x3 weeks. Pt endorses nausea and a warm sensation. Pt denies diarrhea.

## 2021-11-29 ENCOUNTER — Telehealth: Payer: Self-pay

## 2021-11-29 DIAGNOSIS — G8929 Other chronic pain: Secondary | ICD-10-CM

## 2021-11-29 NOTE — Telephone Encounter (Signed)
I spoke with the patient about her Shoulder MRI results, she is still having pain in her shoulder and would like the referral to Ortho. I have placed this order.   She verbalized understanding on all.

## 2022-06-09 ENCOUNTER — Ambulatory Visit: Payer: 59 | Admitting: Podiatry

## 2022-08-01 ENCOUNTER — Emergency Department
Admission: EM | Admit: 2022-08-01 | Discharge: 2022-08-01 | Disposition: A | Discharge status: Home / Self Care | Payer: 59 | Attending: Emergency Medicine | Admitting: Emergency Medicine

## 2022-08-01 ENCOUNTER — Emergency Department: Payer: 59

## 2022-08-01 DIAGNOSIS — I110 Hypertensive heart disease with heart failure: Secondary | ICD-10-CM | POA: Diagnosis not present

## 2022-08-01 DIAGNOSIS — E119 Type 2 diabetes mellitus without complications: Secondary | ICD-10-CM | POA: Diagnosis not present

## 2022-08-01 DIAGNOSIS — I509 Heart failure, unspecified: Secondary | ICD-10-CM | POA: Diagnosis not present

## 2022-08-01 DIAGNOSIS — R0789 Other chest pain: Secondary | ICD-10-CM

## 2022-08-01 DIAGNOSIS — J449 Chronic obstructive pulmonary disease, unspecified: Secondary | ICD-10-CM | POA: Diagnosis not present

## 2022-08-01 LAB — CBC WITH DIFFERENTIAL/PLATELET
Abs Immature Granulocytes: 0.06 10*3/uL (ref 0.00–0.07)
Basophils Absolute: 0 10*3/uL (ref 0.0–0.1)
Basophils Relative: 0 %
Eosinophils Absolute: 0.2 10*3/uL (ref 0.0–0.5)
Eosinophils Relative: 2 %
HCT: 36.9 % (ref 36.0–46.0)
Hemoglobin: 11.4 g/dL — ABNORMAL LOW (ref 12.0–15.0)
Immature Granulocytes: 1 %
Lymphocytes Relative: 24 %
Lymphs Abs: 2.4 10*3/uL (ref 0.7–4.0)
MCH: 28.8 pg (ref 26.0–34.0)
MCHC: 30.9 g/dL (ref 30.0–36.0)
MCV: 93.2 fL (ref 80.0–100.0)
Monocytes Absolute: 0.7 10*3/uL (ref 0.1–1.0)
Monocytes Relative: 6 %
Neutro Abs: 7 10*3/uL (ref 1.7–7.7)
Neutrophils Relative %: 67 %
Platelets: 345 10*3/uL (ref 150–400)
RBC: 3.96 MIL/uL (ref 3.87–5.11)
RDW: 13.3 % (ref 11.5–15.5)
WBC: 10.3 10*3/uL (ref 4.0–10.5)
nRBC: 0 % (ref 0.0–0.2)

## 2022-08-01 LAB — COMPREHENSIVE METABOLIC PANEL
ALT: 10 U/L (ref 0–44)
AST: 18 U/L (ref 15–41)
Albumin: 3.7 g/dL (ref 3.5–5.0)
Alkaline Phosphatase: 54 U/L (ref 38–126)
Anion gap: 12 (ref 5–15)
BUN: 12 mg/dL (ref 8–23)
CO2: 24 mmol/L (ref 22–32)
Calcium: 8.7 mg/dL — ABNORMAL LOW (ref 8.9–10.3)
Chloride: 102 mmol/L (ref 98–111)
Creatinine, Ser: 0.83 mg/dL (ref 0.44–1.00)
GFR, Estimated: >60 mL/min (ref >60)
Glucose, Bld: 150 mg/dL — ABNORMAL HIGH (ref 70–99)
Potassium: 3.2 mmol/L — ABNORMAL LOW (ref 3.5–5.1)
Sodium: 138 mmol/L (ref 135–145)
Total Bilirubin: 0.5 mg/dL (ref 0.3–1.2)
Total Protein: 6.9 g/dL (ref 6.5–8.1)

## 2022-08-01 LAB — D-DIMER, QUANTITATIVE: D-Dimer, Quant: 0.58 ug/mL-FEU — ABNORMAL HIGH (ref 0.00–0.50)

## 2022-08-01 LAB — TROPONIN I (HIGH SENSITIVITY)
Troponin I (High Sensitivity): 3 ng/L (ref <18)
Troponin I (High Sensitivity): 3 ng/L (ref <18)

## 2022-08-01 MED ORDER — TRAMADOL HCL 50 MG PO TABS: 50.0000 mg | ORAL_TABLET | Freq: Three times a day (TID) | ORAL | 0 refills | Status: AC | PRN

## 2022-08-01 MED ORDER — TRAMADOL HCL 50 MG PO TABS
50.0000 mg | ORAL_TABLET | ORAL | Status: AC
  Administered 2022-08-01: 50 mg via ORAL
  Filled 2022-08-01: qty 1

## 2022-08-01 MED ORDER — POTASSIUM CHLORIDE CRYS ER 20 MEQ PO TBCR
40.0000 meq | EXTENDED_RELEASE_TABLET | Freq: Once | ORAL | Status: AC
  Administered 2022-08-01: 40 meq via ORAL
  Filled 2022-08-01: qty 2

## 2022-08-01 NOTE — Discharge Instructions (Signed)
No driving this evening or within 8 hours of use of tramadol.

## 2022-08-01 NOTE — ED Triage Notes (Signed)
Pt ambulatory to triage with steady gait without use of assistive devices.  Pt c/o chest pain, left side. 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  States Pain radiates down arm causing numbness and is now radiating across chest. Reports heavy lifting recently that she thinks has caused pain to worsen today.  Also c/o sob that has also worsened with chest pain exacerbation.  Hx of COPD

## 2022-08-01 NOTE — ED Provider Notes (Signed)
[facility] Provider Note    Event Date/Time   First MD Initiated Contact with Patient 08/01/22 1945     (approximate)   History   Chest Pain   HPI  Teresa [name] is a 62 y.o. female here for evaluation of left-sided chest pain.  She reports pain in her left upper chest that has been present off-and-on for several years.  She reports that sometimes comes and goes and is more severe than other times.  She reports it started after she had a heart surgery about 20 years ago.  Today she noticed it was more present after carrying bags out of a store with her left arm.  She also reports that it tends to get worse at times when she reminisces on the death of her mother a year ago  No shortness of breath.  Uses oxygen at home.  No fevers or chills no cough.  Reports an aching left upper sided chest pain is not radiate to the back  Previous cardiac surgery for a heart valve or other hole in the heart about 20 years ago  Review of primary care note from February 15 notes history of chronic oxygen use, congenital heart disease diabetes COPD pulmonary hypertension, depression overactive bladder TIA type 2 diabetes and VSD repair  No recent trauma or surgeries.  No history of blood clots.  No swelling in 1 leg.  Does not take any estrogen.  No sharp chest pain with breathing or exertion  Physical Exam   Triage Vital Signs: ED Triage Vitals  Enc Vitals Group     BP 08/01/22 1928 (!) 177/81     Pulse Rate 08/01/22 1928 89     Resp 08/01/22 1928 18     Temp 08/01/22 1928 98.1 F (36.7 C)     Temp Source 08/01/22 1928 Oral     SpO2 08/01/22 1928 97 %     Weight 08/01/22 1929 223 lb (101.2 kg)     Height 08/01/22 1929 5\' 2"  (1.575 m)     Head Circumference --      Peak Flow --      Pain Score 08/01/22 1929 8     Pain Loc --      Pain Edu? --      Excl. in GC? --     Most recent vital signs: Vitals:   08/01/22 2030 08/01/22 2230  BP: (!) 149/74 (!) 127/56   Pulse: 84 72  Resp: 18 (!) 22  Temp:    SpO2: 99% 99%     General: Awake, no distress.  Currently on 2 L nasal cannula.  She and her brother at the bedside both very pleasant. CV:  Good peripheral perfusion.  Normal tones and rate.  Evidence of old midline sternotomy.  Patient reports palpation does elicit discomfort along the left sternal border and across the superior pectoralis region.  No overlying lesion or swelling Resp:  Normal effort.  Clear lungs bilaterally Abd:  No distention.  Other:  No lower extremity edema venous cords or congestion.  Patient reports that the pain is fairly achy consistent intermittent for about 20 years.  She reports she has had previously evaluated and thought due to her previous surgery in some way shape or form   ED Results / Procedures / Treatments   Labs (all labs ordered are listed, but only abnormal results are displayed) Labs Reviewed  CBC WITH DIFFERENTIAL/PLATELET - Abnormal; Notable for the following components:  Result Value   Hemoglobin 11.4 (*)    All other components within normal limits  COMPREHENSIVE METABOLIC PANEL - Abnormal; Notable for the following components:   Potassium 3.2 (*)    Glucose, Bld 150 (*)    Calcium 8.7 (*)    All other components within normal limits  D-DIMER, QUANTITATIVE - Abnormal; Notable for the following components:   D-Dimer, Quant 0.58 (*)    All other components within normal limits  TROPONIN I (HIGH SENSITIVITY)  TROPONIN I (HIGH SENSITIVITY)     EKG  Interpreted by me at 1930 heart rate 90 QRS 90 QTc 500 Normal sinus rhythm, mild nonspecific T wave abnormality.  No evidence of frank ischemia.  Mild prolongation of QT interval   RADIOLOGY  Chest x-ray interpreted by me as negative for acute  DG Chest Portable 1 View  Result Date: 08/01/2022 CLINICAL DATA:  Left-sided chest pain.  Pain radiates to the arm. EXAM: PORTABLE CHEST 1 VIEW COMPARISON:  07/28/2020 FINDINGS: Heart size and  pulmonary vascularity are normal. Lungs are clear. No pleural effusions. No pneumothorax. Degenerative changes in the spine and shoulders. Calcification of the aorta. Mediastinal contours appear intact. IMPRESSION: No active disease. Electronically Signed   By: Burman Nieves M.D.   On: 08/01/2022 20:05      PROCEDURES:  Critical Care performed: No  Procedures   MEDICATIONS ORDERED IN ED: Medications  potassium chloride SA (KLOR-CON M) CR tablet 40 mEq (has no administration in time range)  traMADol (ULTRAM) tablet 50 mg (50 mg Oral Given 08/01/22 2115)     IMPRESSION / MDM / ASSESSMENT AND PLAN / ED COURSE  I reviewed the triage vital signs and the nursing notes.                              Differential diagnosis includes, but is not limited to, ACS, aortic dissection, pulmonary embolism, cardiac tamponade, pneumothorax, pneumonia, pericarditis, myocarditis, GI-related causes including esophagitis/gastritis, and musculoskeletal chest wall pain.     Patient's presentation is most consistent with acute complicated illness / injury requiring diagnostic workup.   The patient is on the cardiac monitor to evaluate for evidence of arrhythmia and/or significant heart rate changes.  Symptoms seem atypical of ACS reassuring EKG, troponin normal on 2 checks.  D-dimer within expected range for age.  No notable risk factors or history of thromboembolism.  I suspect atypical chest pain likely musculoskeletal and possibly in relation to her distant sternotomy which she reports is when this pain started it seems to come and go and I suspect was exacerbated by carrying groceries today.  Reassuring workup  No ripping tearing or moving pain to suggest dissection no infectious symptoms no pneumothorax.  She is on her baseline oxygen.  No wheezing or increased work of breathing   Clinical Course as of 08/01/22 2243  Mon Aug 01, 2022  2230 Age-adjusted D-dimer less than 0.61.  Unlikely to be  representative of thromboembolism.  Symptoms seem to be fairly chronic in nature.  Atypical of ACS.  Workup reassuring. [MQ]    Clinical Course User Index [MQ] Teresa Creamer, MD   Vitals:   08/01/22 2030 08/01/22 2230  BP: (!) 149/74 (!) 127/56  Pulse: 84 72  Resp: 18 (!) 22  Temp:    SpO2: 99% 99%    ----------------------------------------- 10:43 PM on 08/01/2022 ----------------------------------------- Resting comfortably.  Tramadol alleviating pain.  Still some mild lingering achiness but patient  reports quite chronic in nature.  Discussed follow-up recommend follow-up for PCP with Dr. Nemiah Commander.  Patient not driving this evening and will not drive while using tramadol.  Discussed safe use of tramadol  Patient advises that she is no longer using duloxetine and has not had oxycodone anytime recently   Return precautions and treatment recommendations and follow-up discussed with the patient who is agreeable with the plan.   FINAL CLINICAL IMPRESSION(S) / ED DIAGNOSES   Final diagnoses:  Atypical chest pain     Rx / DC Orders   ED Discharge Orders          Ordered    traMADol (ULTRAM) 50 MG tablet  Every 8 hours PRN        08/01/22 2242             Note:  This document was prepared using Dragon voice recognition software and may include unintentional dictation errors.   Teresa Creamer, MD 08/01/22 [phone]

## 2022-08-15 ENCOUNTER — Ambulatory Visit: Payer: 59 | Admitting: Podiatry

## 2022-08-29 ENCOUNTER — Ambulatory Visit (INDEPENDENT_AMBULATORY_CARE_PROVIDER_SITE_OTHER): Payer: 59 | Admitting: Podiatry

## 2022-08-29 VITALS — BP 159/69

## 2022-08-29 DIAGNOSIS — M79674 Pain in right toe(s): Secondary | ICD-10-CM | POA: Diagnosis not present

## 2022-08-29 DIAGNOSIS — E119 Type 2 diabetes mellitus without complications: Secondary | ICD-10-CM | POA: Diagnosis not present

## 2022-08-29 DIAGNOSIS — B351 Tinea unguium: Secondary | ICD-10-CM

## 2022-08-29 DIAGNOSIS — E1142 Type 2 diabetes mellitus with diabetic polyneuropathy: Secondary | ICD-10-CM | POA: Diagnosis not present

## 2022-08-29 DIAGNOSIS — M79675 Pain in left toe(s): Secondary | ICD-10-CM | POA: Diagnosis not present

## 2022-09-02 ENCOUNTER — Encounter: Payer: Self-pay | Admitting: Podiatry

## 2022-09-02 NOTE — Progress Notes (Signed)
ANNUAL DIABETIC FOOT EXAM  Subjective: Teresa [name] presents today annual diabetic foot exam.  Chief Complaint  Patient presents with   Nail Problem    DFC,PCP: Enid Baas, MD,LOV:04/24,A1C:6.5       Patient confirms h/o diabetes.  Patient denies any h/o foot wounds.  Risk factors: diabetes, neuropathy, h/o TIA, HTN, COPD, hyperlipemia, current tobacco user.  Enid Baas, MD is patient's PCP.  Past Medical History:  Diagnosis Date   Anxiety disorder    Arthritis    Asthma    Bilateral leg edema    CAD (coronary artery disease)    Chest pain    Chronic back pain    Chronic kidney disease    COPD (chronic obstructive pulmonary disease) (HCC)    DDD (degenerative disc disease), lumbar    Depression    Diabetes (HCC)    Diabetic retinopathy (HCC)    Genital herpes    Heart burn    Heart disease    Heart murmur    Hepatitis    HLD (hyperlipidemia)    Hypertension    Impetigo    Microscopic hematuria    Obesity, morbid (HCC)    On home oxygen therapy    wears 2l/ [place] at night   Pancreatitis    Peripheral neuropathy    Sleep apnea    Thyroid disease    Urinary frequency    Urinary incontinence    Ventricular septal defect    Patient Active Problem List   Diagnosis Date Noted   Aortic atherosclerosis (HCC) 09/16/2020   Bromhidrosis 09/25/2019   SOBOE (shortness of breath on exertion) 07/22/2019   OAB (overactive bladder) 11/21/2018   Pain due to onychomycosis of toenails of both feet 10/11/2018   Herpes simplex vulvovaginitis 07/18/2017   Bilateral leg edema 03/27/2017   Spinal stenosis of lumbar region with radiculopathy 04/18/2016   Morbid obesity (HCC) 10/08/2015   Cystocele, midline 10/08/2015   SUI (stress urinary incontinence, female) 10/08/2015   Menopause 10/08/2015   Urinary frequency 08/06/2015   Microscopic hematuria 08/06/2015   Cystocele, grade 2 08/06/2015   Morbid (severe) obesity due to excess calories (HCC)  05/15/2015   TIA (transient ischemic attack) 01/01/2015   Anterior chest wall pain 09/08/2014   Nicotine addiction 08/19/2014   Benign essential HTN 08/04/2014   Combined fat and carbohydrate induced hyperlipemia 02/12/2014   Cardiac anomaly, congenital 09/06/2013   Disease of lung 09/06/2013   Heart valve disease 09/06/2013   Chronic obstructive pulmonary disease (HCC) 02/19/2013   Dependence on supplemental oxygen 02/19/2013   Pulmonary hypertension (HCC) 02/19/2013   Postprocedural state 02/19/2013   Other specified postprocedural states 02/19/2013   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     acyclovir (ZOVIRAX) 400 MG tablet Take 1 tablet by mouth 2 (two) times daily.     albuterol (PROVENTIL HFA;VENTOLIN HFA) 108 (90 BASE) MCG/ACT inhaler Inhale 1-2  puffs into the lungs every 6 (six) hours as needed for wheezing or shortness of breath.     ALPRAZolam (XANAX) 1 MG tablet Take 1 mg by mouth 3 (three) times daily as needed for anxiety. 3 to 4 times daily PRN     amLODipine (NORVASC) 5 MG tablet Take 5 mg by mouth daily.     aspirin 81 MG EC tablet Take 1 tablet by mouth daily.     aspirin 81 MG tablet Take 1 tablet (81 mg total) by mouth daily. 30 tablet 0   atorvastatin (LIPITOR) 20 MG tablet Take 20 mg by mouth at bedtime.  0   budesonide-formoterol (SYMBICORT) 160-4.5 MCG/ACT inhaler Inhale 2 puffs into  the lungs 2 (two) times daily.      ciprofloxacin (CIPRO) 500 MG tablet Take 1 tablet (500 mg total) by mouth 2 (two) times daily. 14 tablet 0   cyclobenzaprine (FLEXERIL) 10 MG tablet Take 10 mg by mouth 2 (two) times daily as needed.     cyclobenzaprine (FLEXERIL) 5 MG tablet Take by mouth.     diphenoxylate-atropine (LOMOTIL) 2.5-0.025 MG tablet      enalapril (VASOTEC) 20 MG tablet Take 20 mg by mouth 2 (two) times daily.     enalapril (VASOTEC) 5 MG tablet Take 5 mg by mouth daily.     fluticasone (FLONASE) 50 MCG/ACT nasal spray Place 1 spray into both nostrils daily as needed.   0   furosemide (LASIX) 40 MG tablet Take 40 mg by mouth daily.     gabapentin (NEURONTIN) 300 MG capsule Take 300 mg by mouth 3 (three) times daily.   0   gabapentin (NEURONTIN) 300 MG capsule Take 1 capsule by mouth 3 (three) times daily.     glucose blood (ONETOUCH ULTRA) test strip Use to test blood sugars four times daily. E11.9     ibuprofen (ADVIL) 600 MG tablet TAKE 1 TABLET(600 MG) BY MOUTH EVERY 8 HOURS AS NEEDED FOR PAIN     insulin lispro (HUMALOG) 100 UNIT/ML injection Inject 4-10 Units into the skin 3 (three) times daily with meals. Sliding scale     LANTUS SOLOSTAR 100 UNIT/ML Solostar Pen      metFORMIN (GLUCOPHAGE) 500 MG tablet Take 1,000 mg by mouth 2 (two) times daily.     metoprolol succinate (TOPROL-XL) 25 MG 24 hr tablet Take 25 mg by mouth daily.     metoprolol tartrate (LOPRESSOR) 25 MG tablet Take 25 mg by mouth 2 (two) times daily. Reported on 08/06/2015     MYRBETRIQ 25 MG TB24 tablet Take 25 mg by mouth daily.     NICODERM CQ 7 MG/24HR patch Place 7 mg onto the skin daily.   0   NYSTATIN powder      ondansetron (ZOFRAN-ODT) 4 MG disintegrating tablet Take 1 tablet (4 mg total) by mouth every 8 (eight) hours as needed for nausea or vomiting. 20 tablet 0   OXYGEN Inhale 2 Units into the lungs.      OXYQUINOLONE SULFATE VAGINAL (TRIMO-SAN) 0.025 % GEL Place 0.025 Applicatorfuls vaginally  once a week. 113.4 g 3   pantoprazole (PROTONIX) 40 MG tablet Take 40 mg by mouth daily.     polyethylene glycol powder (GLYCOLAX/MIRALAX) powder Take 17 g by mouth daily as needed.      Spacer/Aero-Holding Chambers (AEROCHAMBER MV) inhaler by Does not apply route.     tiotropium (SPIRIVA) 18 MCG inhalation capsule Place 18 mcg into inhaler and inhale daily.  traMADol (ULTRAM) 50 MG tablet Take 1 tablet (50 mg total) by mouth every 8 (eight) hours as needed for moderate pain. 12 tablet 0   traZODone (DESYREL) 100 MG tablet Take by mouth at bedtime as needed.      TRIMO-SAN 0.025-0.01 % GEL SMARTSIG:1 Applicator Vaginal Once a Week     buPROPion (WELLBUTRIN XL) 300 MG 24 hr tablet Take by mouth.     Current Facility-Administered Medications on File Prior to Visit  Medication Dose Route Frequency Provider Last Rate Last Admin   triamcinolone acetonide (KENALOG) 10 MG/ML injection 10 mg  10 mg Other Once Vivi Barrack, DPM        Allergies  Allergen Reactions   Other Swelling   Penicillins Other (See Comments), Hives, Rash and Shortness Of Breath    Body shakes Body shakes nervousness    Sulfa Antibiotics Shortness Of Breath   Amoxicillin Other (See Comments)    Body shakes   Duloxetine    Morphine Other (See Comments)    Hallucinations Hallucinations   Social History   Occupational History   Not on file  Tobacco Use   Smoking status: Light Smoker    Types: Cigarettes   Smokeless tobacco: Never  Vaping Use   Vaping Use: Never used  Substance and Sexual Activity   Alcohol use: No    Alcohol/week: 0.0 standard drinks of alcohol   Drug use: No   Sexual activity: Yes   Family History  Problem Relation Age of Onset   Diabetes Other        2 Brother 2 Sister   Hypertension Other    Kidney disease Neg Hx    Bladder Cancer Neg Hx    Kidney cancer Neg Hx    Immunization History  Administered Date(s) Administered   Hepatitis B, ADULT 05/06/2011   Influenza Whole  12/06/2012   Influenza, Seasonal, Injecte, Preservative Fre 01/17/2007, 01/08/2008, 01/20/2009, 05/25/2010, 11/25/2011, 12/27/2011, 05/30/2012, 06/11/2013   Influenza,inj,Quad PF,6+ Mos 04/17/2015, 11/11/2015, 12/15/2016, 04/24/2017, 01/23/2018, 11/20/2018   Influenza-Unspecified 11/19/2012, 01/11/2013, 12/17/2013, 04/17/2015, 11/11/2015, 12/15/2016, 01/23/2018, 01/06/2020   Pneumococcal Polysaccharide-23 01/15/2009, 04/14/2016   Pneumococcal-Unspecified 01/06/2020   Tdap 07/08/2020     Review of Systems: Negative except as noted in the HPI.   Objective: Vitals:   08/29/22 1420  BP: (!) 159/69   Teresa [name] is a pleasant 62 y.o. female in NAD. AAO X 3.  Vascular Examination: Capillary refill time immediate b/l. Vascular status intact b/l with palpable pedal pulses. Pedal hair present b/l. No edema. No pain with calf compression b/l. Skin temperature gradient WNL b/l.   Neurological Examination: Sensation grossly intact b/l with 10 gram monofilament. Vibratory sensation intact b/l. Pt has subjective symptoms of neuropathy.  Dermatological Examination: Pedal skin with normal turgor, texture and tone b/l.  No open wounds. No interdigital macerations.   Toenails 1-5 b/l thick, discolored, elongated with subungual debris and pain on dorsal palpation.   No hyperkeratotic nor porokeratotic lesions present on today's visit.  Musculoskeletal Examination: Normal muscle strength 5/5 to all lower extremity muscle groups bilaterally. No pain, crepitus or joint limitation noted with ROM b/l LE. No gross bony pedal deformities b/l. Patient ambulates independently without assistive aids.  Radiographs: None  Last A1c:       No data to display         Lab Results  Component Value Date   HGBA1C 6.5 (H) 09/08/2012   ADA Risk Categorization: Low Risk :  Patient has all of  the following: Intact protective sensation No prior foot ulcer  No severe deformity Pedal pulses  present  Assessment: 1. Pain due to onychomycosis of toenails of both feet   2. Type 2 diabetes mellitus with diabetic polyneuropathy, without long-term current use of insulin (HCC)   3. Encounter for diabetic foot exam [facility])     Plan: -Patient was evaluated and treated. All patient's and/or POA's questions/concerns answered on today's visit. -Diabetic foot examination performed today. -Continue diabetic foot care principles: inspect feet daily, monitor glucose as recommended by PCP and/or Endocrinologist, and follow prescribed diet per PCP, Endocrinologist and/or dietician. -Patient to continue soft, supportive shoe gear daily. -Toenails 1-5 b/l were debrided in length and girth with sterile nail nippers and dremel without iatrogenic bleeding.  -Patient/POA to call should there be question/concern in the interim. Return in about 3 months (around 11/29/2022).  Freddie Breech, DPM

## 2022-10-10 ENCOUNTER — Emergency Department: Payer: 59

## 2022-10-10 ENCOUNTER — Emergency Department
Admission: EM | Admit: 2022-10-10 | Discharge: 2022-10-10 | Disposition: A | Discharge status: Home / Self Care | Payer: 59 | Attending: Emergency Medicine | Admitting: Emergency Medicine

## 2022-10-10 ENCOUNTER — Encounter: Payer: Self-pay | Admitting: Radiology

## 2022-10-10 ENCOUNTER — Other Ambulatory Visit: Payer: Self-pay

## 2022-10-10 DIAGNOSIS — K859 Acute pancreatitis without necrosis or infection, unspecified: Secondary | ICD-10-CM

## 2022-10-10 DIAGNOSIS — R1013 Epigastric pain: Secondary | ICD-10-CM | POA: Diagnosis present

## 2022-10-10 LAB — COMPREHENSIVE METABOLIC PANEL
ALT: 9 U/L (ref 0–44)
AST: 15 U/L (ref 15–41)
Albumin: 4.3 g/dL (ref 3.5–5.0)
Alkaline Phosphatase: 60 U/L (ref 38–126)
Anion gap: 10 (ref 5–15)
BUN: 11 mg/dL (ref 8–23)
CO2: 25 mmol/L (ref 22–32)
Calcium: 9 mg/dL (ref 8.9–10.3)
Chloride: 102 mmol/L (ref 98–111)
Creatinine, Ser: 0.94 mg/dL (ref 0.44–1.00)
GFR, Estimated: >60 mL/min (ref >60)
Glucose, Bld: 106 mg/dL — ABNORMAL HIGH (ref 70–99)
Potassium: 3.5 mmol/L (ref 3.5–5.1)
Sodium: 137 mmol/L (ref 135–145)
Total Bilirubin: 0.7 mg/dL (ref 0.3–1.2)
Total Protein: 7.8 g/dL (ref 6.5–8.1)

## 2022-10-10 LAB — URINALYSIS, ROUTINE W REFLEX MICROSCOPIC
Bilirubin Urine: NEGATIVE
Glucose, UA: NEGATIVE mg/dL
Hgb urine dipstick: NEGATIVE
Ketones, ur: NEGATIVE mg/dL
Leukocytes,Ua: NEGATIVE
Nitrite: NEGATIVE
Protein, ur: 30 mg/dL — AB
Specific Gravity, Urine: 1.009 (ref 1.005–1.030)
pH: 6 (ref 5.0–8.0)

## 2022-10-10 LAB — CBC
HCT: 37.7 % (ref 36.0–46.0)
Hemoglobin: 11.6 g/dL — ABNORMAL LOW (ref 12.0–15.0)
MCH: 28.8 pg (ref 26.0–34.0)
MCHC: 30.8 g/dL (ref 30.0–36.0)
MCV: 93.5 fL (ref 80.0–100.0)
Platelets: 350 10*3/uL (ref 150–400)
RBC: 4.03 MIL/uL (ref 3.87–5.11)
RDW: 13.5 % (ref 11.5–15.5)
WBC: 7.4 10*3/uL (ref 4.0–10.5)
nRBC: 0 % (ref 0.0–0.2)

## 2022-10-10 LAB — TROPONIN I (HIGH SENSITIVITY): Troponin I (High Sensitivity): 4 ng/L (ref <18)

## 2022-10-10 LAB — CBG MONITORING, ED
Glucose-Capillary: 24 mg/dL — CL (ref 70–99)
Glucose-Capillary: 33 mg/dL — CL (ref 70–99)
Glucose-Capillary: 132 mg/dL — ABNORMAL HIGH (ref 70–99)
Glucose-Capillary: 85 mg/dL (ref 70–99)
Glucose-Capillary: 88 mg/dL (ref 70–99)

## 2022-10-10 LAB — LIPASE, BLOOD: Lipase: 60 U/L — ABNORMAL HIGH (ref 11–51)

## 2022-10-10 MED ORDER — OXYCODONE-ACETAMINOPHEN 5-325 MG PO TABS: 1.0000 | ORAL_TABLET | Freq: Four times a day (QID) | ORAL | 0 refills | Status: AC | PRN

## 2022-10-10 MED ORDER — FAMOTIDINE 20 MG PO TABS: 20.0000 mg | ORAL_TABLET | Freq: Every day | ORAL | 0 refills | Status: AC

## 2022-10-10 MED ORDER — ONDANSETRON 4 MG PO TBDP: 4.0000 mg | ORAL_TABLET | Freq: Three times a day (TID) | ORAL | 0 refills | Status: AC | PRN

## 2022-10-10 MED ORDER — OXYCODONE HCL 5 MG PO TABS
5.0000 mg | ORAL_TABLET | Freq: Once | ORAL | Status: AC
  Administered 2022-10-10: 5 mg via ORAL
  Filled 2022-10-10: qty 1

## 2022-10-10 MED ORDER — DEXTROSE 50 % IV SOLN
INTRAVENOUS | Status: AC
  Administered 2022-10-10: 50 mL via INTRAVENOUS
  Filled 2022-10-10: qty 50

## 2022-10-10 MED ORDER — KETOROLAC TROMETHAMINE 30 MG/ML IJ SOLN
15.0000 mg | Freq: Once | INTRAMUSCULAR | Status: AC
  Administered 2022-10-10: 15 mg via INTRAVENOUS
  Filled 2022-10-10: qty 1

## 2022-10-10 MED ORDER — DEXTROSE 50 % IV SOLN: 1.0000 | Freq: Once | INTRAVENOUS | Status: AC

## 2022-10-10 MED ORDER — OXYCODONE HCL 5 MG PO TABS
5.0000 mg | ORAL_TABLET | Freq: Once | ORAL | Status: DC
  Filled 2022-10-10: qty 1

## 2022-10-10 MED ORDER — ALUM & MAG HYDROXIDE-SIMETH 200-200-20 MG/5ML PO SUSP
30.0000 mL | Freq: Once | ORAL | Status: AC
  Administered 2022-10-10: 30 mL via ORAL
  Filled 2022-10-10: qty 30

## 2022-10-10 MED ORDER — IOHEXOL 300 MG/ML  SOLN
100.0000 mL | Freq: Once | INTRAMUSCULAR | Status: AC | PRN
  Administered 2022-10-10: 100 mL via INTRAVENOUS

## 2022-10-10 NOTE — ED Provider Notes (Signed)
[facility] Provider Note    Event Date/Time   First MD Initiated Contact with Patient 10/10/22 1002     (approximate)   History   Abdominal Pain   HPI  Teresa [name] is a 62 y.o. female  here with abdominal and chest pain. Pt reports her main complaint is epigastric and left sided abd pain x 2 weeks. She has had associated nausea, loose BMs. She has a h/o pancreatitis with similar sx. She also notes she has had a cough and occasional substernal chest pain, as well as SOB. No fevers. No chills. No urinary sx.        Physical Exam   Triage Vital Signs: ED Triage Vitals  Enc Vitals Group     BP 10/10/22 0944 (!) 154/71     Pulse Rate 10/10/22 0944 (!) 101     Resp 10/10/22 0944 18     Temp 10/10/22 0943 98.2 F (36.8 C)     Temp src --      SpO2 10/10/22 0944 97 %     Weight 10/10/22 0945 219 lb (99.3 kg)     Height 10/10/22 0945 5\' 2"  (1.575 m)     Head Circumference --      Peak Flow --      Pain Score 10/10/22 0945 7     Pain Loc --      Pain Edu? --      Excl. in GC? --     Most recent vital signs: Vitals:   10/10/22 1431 10/10/22 1530  BP: 130/85 (!) 153/75  Pulse: 82 83  Resp: 16 (!) 21  Temp: (!) 97.1 F (36.2 C)   SpO2: 95% 94%     General: Awake, no distress.  CV:  Good peripheral perfusion. RRR. Resp:  Normal work of breathing. Lungs clear. Abd:  No distention. Moderate diffuse TTP, worst in epigastric and LUQ. Other:  No significant edema. Well appearing in NAD.   ED Results / Procedures / Treatments   Labs (all labs ordered are listed, but only abnormal results are displayed) Labs Reviewed  LIPASE, BLOOD - Abnormal; Notable for the following components:      Result Value   Lipase 60 (*)    All other components within normal limits  COMPREHENSIVE METABOLIC PANEL - Abnormal; Notable for the following components:   Glucose, Bld 106 (*)    All other components within normal limits  CBC - Abnormal; Notable for  the following components:   Hemoglobin 11.6 (*)    All other components within normal limits  URINALYSIS, ROUTINE W REFLEX MICROSCOPIC - Abnormal; Notable for the following components:   Color, Urine STRAW (*)    APPearance CLEAR (*)    Protein, ur 30 (*)    Bacteria, UA RARE (*)    All other components within normal limits  CBG MONITORING, ED - Abnormal; Notable for the following components:   Glucose-Capillary 24 (*)    All other components within normal limits  CBG MONITORING, ED - Abnormal; Notable for the following components:   Glucose-Capillary 33 (*)    All other components within normal limits  CBG MONITORING, ED - Abnormal; Notable for the following components:   Glucose-Capillary 132 (*)    All other components within normal limits  CBG MONITORING, ED  CBG MONITORING, ED  TROPONIN I (HIGH SENSITIVITY)     EKG Normal sinus rhythm. VR 88. PR 192, QRS 82, QTc 583. No acute ST elevations  or depressions. No ischemia or infarct.   RADIOLOGY CXR: Clear CT A/P: No acute abnormality   I also independently reviewed and agree with radiologist interpretations.   PROCEDURES:  Critical Care performed: No  .1-3 Lead EKG Interpretation  Performed by: Shaune Pollack, MD Authorized by: Shaune Pollack, MD     Interpretation: non-specific     ECG rate:  90-110   ECG rate assessment: normal     Rhythm: sinus rhythm     Ectopy: none     Conduction: normal   Comments:     Indication: chest/abdominal pain     MEDICATIONS ORDERED IN ED: Medications  iohexol (OMNIPAQUE) 300 MG/ML solution 100 mL (100 mLs Intravenous Contrast Given 10/10/22 1156)  dextrose 50 % solution 50 mL (50 mLs Intravenous Given by Other 10/10/22 1231)  ketorolac (TORADOL) 30 MG/ML injection 15 mg (15 mg Intravenous Given 10/10/22 1307)  alum & mag hydroxide-simeth (MAALOX/MYLANTA) 200-200-20 MG/5ML suspension 30 mL (30 mLs Oral Given 10/10/22 1305)  oxyCODONE (Oxy IR/ROXICODONE) immediate release tablet  5 mg (5 mg Oral Given 10/10/22 1428)     IMPRESSION / MDM / ASSESSMENT AND PLAN / ED COURSE  I reviewed the triage vital signs and the nursing notes.                              Differential diagnosis includes, but is not limited to, atypical chest pain/MSK chest pain, gastritis, GERD, colitis, diverticulitis, recurrent pancreatitis, UTI, IBS, basilar PNA, PE  Patient's presentation is most consistent with acute presentation with potential threat to life or bodily function.  The patient is on the cardiac monitor to evaluate for evidence of arrhythmia and/or significant heart rate changes  62 yo F here with abdominal pain, nausea. Likely mild acute on chronic pancreatitis versus IBS. Pt is well appearing after fluids, sx control and is tolerating PO. Work up very reassuring. CMP unremarkable with normal LFTs. CBC without leukocytosis. UA shows no signs of UTI. EKG nonischemic and trop negative. CT A/P obtained, reviewed, and shows no acute abnormality. Lipase minimally elevated at 60  and she has a h/o recurrent pancreatitis. Suspect this is etiology of her sx, no other emergent pathology identified. Will tx symptomatically, give supportive care and dc home. She had mild transient hypoglycemia in ED which I think was 2/2 taking her insulin then not eating for her w/u - she was given food, tolerated it well and has had stable Glu since then.     FINAL CLINICAL IMPRESSION(S) / ED DIAGNOSES   Final diagnoses:  Acute on chronic pancreatitis (HCC)  Epigastric pain     Rx / DC Orders   ED Discharge Orders          Ordered    oxyCODONE-acetaminophen (PERCOCET) 5-325 MG tablet  Every 6 hours PRN        10/10/22 1414    ondansetron (ZOFRAN-ODT) 4 MG disintegrating tablet  Every 8 hours PRN        10/10/22 1414    famotidine (PEPCID) 20 MG tablet  Daily        10/10/22 1414             Note:  This document was prepared using Dragon voice recognition software and may include  unintentional dictation errors.   Shaune Pollack, MD 10/10/22 1950

## 2022-10-10 NOTE — ED Notes (Signed)
Pt tolerated eating sandwich tray and drinking ginger ale.

## 2022-10-10 NOTE — ED Notes (Signed)
Pt transported to CT ?

## 2022-10-10 NOTE — ED Triage Notes (Signed)
Pt to ED for circumferential abd pain x2 weeks. +nausea. Hx pancreatitis.  States wears 2 L [place] chronic. Pt is currently97% on RA, came in not wearing O2 but has tank with her.

## 2022-12-02 ENCOUNTER — Ambulatory Visit: Payer: 59 | Admitting: Podiatry

## 2022-12-10 ENCOUNTER — Other Ambulatory Visit: Payer: Self-pay

## 2022-12-10 ENCOUNTER — Emergency Department
Admission: EM | Admit: 2022-12-10 | Discharge: 2022-12-10 | Disposition: A | Discharge status: Home / Self Care | Payer: 59 | Attending: Emergency Medicine | Admitting: Emergency Medicine

## 2022-12-10 ENCOUNTER — Emergency Department: Payer: 59

## 2022-12-10 DIAGNOSIS — J441 Chronic obstructive pulmonary disease with (acute) exacerbation: Secondary | ICD-10-CM | POA: Insufficient documentation

## 2022-12-10 DIAGNOSIS — R0602 Shortness of breath: Secondary | ICD-10-CM | POA: Diagnosis present

## 2022-12-10 DIAGNOSIS — Z1152 Encounter for screening for COVID-19: Secondary | ICD-10-CM | POA: Diagnosis not present

## 2022-12-10 DIAGNOSIS — I10 Essential (primary) hypertension: Secondary | ICD-10-CM | POA: Insufficient documentation

## 2022-12-10 LAB — URINALYSIS, MICROSCOPIC (REFLEX)

## 2022-12-10 LAB — TROPONIN I (HIGH SENSITIVITY): Troponin I (High Sensitivity): 4 ng/L (ref <18)

## 2022-12-10 LAB — URINALYSIS, ROUTINE W REFLEX MICROSCOPIC
Glucose, UA: NEGATIVE mg/dL
Hgb urine dipstick: NEGATIVE
Ketones, ur: 15 mg/dL — AB
Leukocytes,Ua: NEGATIVE
Nitrite: NEGATIVE
Protein, ur: 100 mg/dL — AB
Specific Gravity, Urine: >1.03 — ABNORMAL HIGH (ref 1.005–1.030)
pH: 5.5 (ref 5.0–8.0)

## 2022-12-10 LAB — CBC
HCT: 35.5 % — ABNORMAL LOW (ref 36.0–46.0)
Hemoglobin: 10.8 g/dL — ABNORMAL LOW (ref 12.0–15.0)
MCH: 28.4 pg (ref 26.0–34.0)
MCHC: 30.4 g/dL (ref 30.0–36.0)
MCV: 93.4 fL (ref 80.0–100.0)
Platelets: 328 10*3/uL (ref 150–400)
RBC: 3.8 MIL/uL — ABNORMAL LOW (ref 3.87–5.11)
RDW: 13.2 % (ref 11.5–15.5)
WBC: 10.8 10*3/uL — ABNORMAL HIGH (ref 4.0–10.5)
nRBC: 0 % (ref 0.0–0.2)

## 2022-12-10 LAB — BASIC METABOLIC PANEL
Anion gap: 13 (ref 5–15)
BUN: 13 mg/dL (ref 8–23)
CO2: 25 mmol/L (ref 22–32)
Calcium: 9.2 mg/dL (ref 8.9–10.3)
Chloride: 101 mmol/L (ref 98–111)
Creatinine, Ser: 0.99 mg/dL (ref 0.44–1.00)
GFR, Estimated: >60 mL/min (ref >60)
Glucose, Bld: 75 mg/dL (ref 70–99)
Potassium: 3.9 mmol/L (ref 3.5–5.1)
Sodium: 139 mmol/L (ref 135–145)

## 2022-12-10 LAB — SARS CORONAVIRUS 2 BY RT PCR: SARS Coronavirus 2 by RT PCR: NEGATIVE

## 2022-12-10 MED ORDER — ALBUTEROL SULFATE (2.5 MG/3ML) 0.083% IN NEBU
2.5000 mg | INHALATION_SOLUTION | Freq: Once | RESPIRATORY_TRACT | Status: AC
  Administered 2022-12-10: 2.5 mg via RESPIRATORY_TRACT
  Filled 2022-12-10: qty 3

## 2022-12-10 MED ORDER — PREDNISONE 20 MG PO TABS: 60.0000 mg | ORAL_TABLET | Freq: Every day | ORAL | 0 refills | Status: AC

## 2022-12-10 MED ORDER — METHYLPREDNISOLONE SODIUM SUCC 125 MG IJ SOLR
125.0000 mg | Freq: Once | INTRAMUSCULAR | Status: AC
  Administered 2022-12-10: 125 mg via INTRAVENOUS
  Filled 2022-12-10: qty 2

## 2022-12-10 NOTE — ED Provider Notes (Signed)
[facility] Provider Note    Event Date/Time   First MD Initiated Contact with Patient 12/10/22 1952     (approximate)   History   Shortness of Breath   HPI  Teresa [name] is a 62 y.o. female with a history of COPD, hypertension, hyperlipidemia, neuropathy, GERD, chronic pain, and anxiety who presents with increased shortness of breath over the last several days.  The patient states that for the last week she has had some nasal congestion, rhinorrhea, cough, thought she may have a viral infection.  Then over the last 2 days she developed increased cough which is productive of clear sputum.  Associated with tightness in her chest and shortness of breath on exertion.  She is chronically on 2 L of O2 and uses CPAP at night.  She denies any fever or chills.  She has no vomiting or diarrhea.  I reviewed the past medical records per the patient's most recent outpatient encounter was on 8/20 with Dr. Nemiah Commander from internal medicine for follow-up of her chronic conditions.  She was noted to have chronic respiratory failure secondary to COPD at that time and was to continue on home oxygen.   Physical Exam   Triage Vital Signs: ED Triage Vitals [12/10/22 1706]  Encounter Vitals Group     BP 132/69     Systolic BP Percentile      Diastolic BP Percentile      Pulse Rate 91     Resp 20     Temp 98.5 F (36.9 C)     Temp src      SpO2 98 %     Weight      Height      Head Circumference      Peak Flow      Pain Score 8     Pain Loc      Pain Education      Exclude from Growth Chart     Most recent vital signs: Vitals:   12/10/22 1706 12/10/22 2200  BP: 132/69 (!) 156/75  Pulse: 91 79  Resp: 20   Temp: 98.5 F (36.9 C)   SpO2: 98% 100%     General: Awake, no distress.  CV:  Good peripheral perfusion.  Resp:  Normal effort.  Faint wheezes bilaterally. Abd:  No distention.  Other:  No peripheral edema.   ED Results / Procedures / Treatments    Labs (all labs ordered are listed, but only abnormal results are displayed) Labs Reviewed  CBC - Abnormal; Notable for the following components:      Result Value   WBC 10.8 (*)    RBC 3.80 (*)    Hemoglobin 10.8 (*)    HCT 35.5 (*)    All other components within normal limits  URINALYSIS, ROUTINE W REFLEX MICROSCOPIC - Abnormal; Notable for the following components:   Color, Urine STRAW (*)    APPearance HAZY (*)    Specific Gravity, Urine >1.030 (*)    Bilirubin Urine SMALL (*)    Ketones, ur 15 (*)    Protein, ur 100 (*)    All other components within normal limits  URINALYSIS, MICROSCOPIC (REFLEX) - Abnormal; Notable for the following components:   Bacteria, UA RARE (*)    All other components within normal limits  SARS CORONAVIRUS 2 BY RT PCR  BASIC METABOLIC PANEL  TROPONIN I (HIGH SENSITIVITY)     EKG  ED ECG REPORT I, Dionne Bucy, the attending  physician, personally viewed and interpreted this ECG.  Date: 12/10/2022 EKG Time: 1709 Rate: 86 Rhythm: normal sinus rhythm QRS Axis: normal Intervals: normal ST/T Wave abnormalities: Nonspecific T wave abnormality Narrative Interpretation: no evidence of acute ischemia: No significant change when compared to EKG of 10/10/2022    RADIOLOGY  Chest x-ray: I independently viewed and interpreted the images; there is no focal consolidation or edema   PROCEDURES:  Critical Care performed: No  Procedures   MEDICATIONS ORDERED IN ED: Medications  albuterol (PROVENTIL) (2.5 MG/3ML) 0.083% nebulizer solution 2.5 mg (2.5 mg Nebulization Given 12/10/22 2144)  albuterol (PROVENTIL) (2.5 MG/3ML) 0.083% nebulizer solution 2.5 mg (2.5 mg Nebulization Given 12/10/22 2144)  methylPREDNISolone sodium succinate (SOLU-MEDROL) 125 mg/2 mL injection 125 mg (125 mg Intravenous Given 12/10/22 2143)     IMPRESSION / MDM / ASSESSMENT AND PLAN / ED COURSE  I reviewed the triage vital signs and the nursing  notes.  62 year old female with PMH as noted above presents with increased shortness of breath, chest tightness, and productive cough after having had URI symptoms for several days before this.  On exam, her O2 saturation is in the mid 90s on her normal 2 L.  Other vital signs are normal.  She has no acute respiratory distress but lung exam does reveal bilateral wheezing.  Differential diagnosis includes, but is not limited to, COPD exacerbation, acute bronchitis, COVID-19, other viral infection, bacterial pneumonia, less likely cardiac etiology.  Chest x-ray shows no acute abnormalities.  CBC shows no leukocytosis or anemia.  Troponin is negative.  I have added on a COVID swab.  We will give bronchodilators, steroid, and reassess.  Patient's presentation is most consistent with exacerbation of chronic illness.  The patient is on the cardiac monitor to evaluate for evidence of arrhythmia and/or significant heart rate changes.   ----------------------------------------- 10:20 PM on 12/10/2022 -----------------------------------------  COVID swab is negative.  The patient is feeling better after the bronchodilators and steroid.  I did consider whether she may benefit from inpatient admission, however she is not requiring more oxygen than normal, has no respiratory distress, and has a reassuring workup.  The patient would prefer to go home.  I counseled the patient on the results of the workup and plan of care.  I have prescribed a course of prednisone.  She will use her own albuterol that she already has.  Return precautions given, and she expresses understanding.  FINAL CLINICAL IMPRESSION(S) / ED DIAGNOSES   Final diagnoses:  COPD exacerbation (HCC)     Rx / DC Orders   ED Discharge Orders          Ordered    predniSONE (DELTASONE) 20 MG tablet  Daily        12/10/22 2219             Note:  This document was prepared using Dragon voice recognition software and may include  unintentional dictation errors.    Dionne Bucy, MD 12/10/22 2325

## 2022-12-10 NOTE — Discharge Instructions (Signed)
Take the prednisone as prescribed starting tomorrow.  Use your albuterol inhaler up to every 4-6 hours as needed for shortness of breath or chest tightness.  Continue to use your Symbicort as prescribed.  Follow-up with your primary care provider in the next week.  Return to the ER for new, worsening, or persistent severe shortness of breath, chest pain, fever, weakness, or any other new or worsening symptoms that concern you.

## 2022-12-10 NOTE — ED Triage Notes (Addendum)
Pt to Ed via [facility] from home. Pt reports has been getting over a virus and has become increasingly SOB the last 2 days. Pt reports woke up this morning and felt worse. Pt wears 2L [place] chronically and current smoker. Pt hx COPD. Pt also endorses chest pressure. Pt has 20g LH

## 2022-12-10 NOTE — ED Notes (Signed)
First Nurse Note: Pt to ED via [facility] from home for sudden onset chest pressure and shortness of breath, congestion, dizziness, and nausea. Pt reports that she has had congestion and cough for the last several days. Per EMS upon their arrival to the ED pts dexcom alerted her that her CBG was 80. Pt was given 4 oz of juice by ED staff. Pt has 20 G IV in her left hand and was given 4 mg of Zofran in route.  Pt is on chronic O2 at 2 LMP.

## 2022-12-24 ENCOUNTER — Emergency Department
Admission: EM | Admit: 2022-12-24 | Discharge: 2022-12-25 | Disposition: A | Discharge status: Home / Self Care | Payer: 59 | Attending: Emergency Medicine | Admitting: Emergency Medicine

## 2022-12-24 ENCOUNTER — Other Ambulatory Visit: Payer: Self-pay

## 2022-12-24 DIAGNOSIS — R109 Unspecified abdominal pain: Secondary | ICD-10-CM | POA: Diagnosis present

## 2022-12-24 DIAGNOSIS — R1010 Upper abdominal pain, unspecified: Secondary | ICD-10-CM | POA: Insufficient documentation

## 2022-12-24 DIAGNOSIS — D72829 Elevated white blood cell count, unspecified: Secondary | ICD-10-CM | POA: Insufficient documentation

## 2022-12-24 LAB — COMPREHENSIVE METABOLIC PANEL
ALT: 12 U/L (ref 0–44)
AST: 14 U/L — ABNORMAL LOW (ref 15–41)
Albumin: 3.6 g/dL (ref 3.5–5.0)
Alkaline Phosphatase: 52 U/L (ref 38–126)
Anion gap: 11 (ref 5–15)
BUN: 12 mg/dL (ref 8–23)
CO2: 26 mmol/L (ref 22–32)
Calcium: 8.9 mg/dL (ref 8.9–10.3)
Chloride: 102 mmol/L (ref 98–111)
Creatinine, Ser: 0.91 mg/dL (ref 0.44–1.00)
GFR, Estimated: >60 mL/min (ref >60)
Glucose, Bld: 134 mg/dL — ABNORMAL HIGH (ref 70–99)
Potassium: 3.3 mmol/L — ABNORMAL LOW (ref 3.5–5.1)
Sodium: 139 mmol/L (ref 135–145)
Total Bilirubin: 0.5 mg/dL (ref 0.3–1.2)
Total Protein: 6.9 g/dL (ref 6.5–8.1)

## 2022-12-24 LAB — CBC
HCT: 34.5 % — ABNORMAL LOW (ref 36.0–46.0)
Hemoglobin: 10.7 g/dL — ABNORMAL LOW (ref 12.0–15.0)
MCH: 29 pg (ref 26.0–34.0)
MCHC: 31 g/dL (ref 30.0–36.0)
MCV: 93.5 fL (ref 80.0–100.0)
Platelets: 289 10*3/uL (ref 150–400)
RBC: 3.69 MIL/uL — ABNORMAL LOW (ref 3.87–5.11)
RDW: 13.3 % (ref 11.5–15.5)
WBC: 10.9 10*3/uL — ABNORMAL HIGH (ref 4.0–10.5)
nRBC: 0 % (ref 0.0–0.2)

## 2022-12-24 LAB — CBG MONITORING, ED: Glucose-Capillary: 92 mg/dL (ref 70–99)

## 2022-12-24 LAB — LIPASE, BLOOD: Lipase: 48 U/L (ref 11–51)

## 2022-12-24 MED ORDER — OXYCODONE-ACETAMINOPHEN 5-325 MG PO TABS: 1.0000 | ORAL_TABLET | Freq: Four times a day (QID) | ORAL | 0 refills | Status: AC | PRN

## 2022-12-24 MED ORDER — ALUM & MAG HYDROXIDE-SIMETH 200-200-20 MG/5ML PO SUSP
30.0000 mL | Freq: Once | ORAL | Status: AC
  Administered 2022-12-24: 30 mL via ORAL
  Filled 2022-12-24: qty 30

## 2022-12-24 MED ORDER — ONDANSETRON 4 MG PO TBDP
4.0000 mg | ORAL_TABLET | Freq: Once | ORAL | Status: AC | PRN
  Administered 2022-12-24: 4 mg via ORAL
  Filled 2022-12-24: qty 1

## 2022-12-24 MED ORDER — POTASSIUM CHLORIDE CRYS ER 20 MEQ PO TBCR
40.0000 meq | EXTENDED_RELEASE_TABLET | Freq: Once | ORAL | Status: AC
  Administered 2022-12-25: 40 meq via ORAL
  Filled 2022-12-24: qty 2

## 2022-12-24 MED ORDER — SODIUM CHLORIDE 0.9 % IV BOLUS
1000.0000 mL | Freq: Once | INTRAVENOUS | Status: AC
  Administered 2022-12-24: 1000 mL via INTRAVENOUS

## 2022-12-24 MED ORDER — OXYCODONE-ACETAMINOPHEN 5-325 MG PO TABS
1.0000 | ORAL_TABLET | Freq: Once | ORAL | Status: AC
  Administered 2022-12-24: 1 via ORAL
  Filled 2022-12-24: qty 1

## 2022-12-24 MED ORDER — ONDANSETRON HCL 4 MG PO TABS: 4.0000 mg | ORAL_TABLET | Freq: Four times a day (QID) | ORAL | 0 refills | Status: AC | PRN

## 2022-12-24 NOTE — Discharge Instructions (Addendum)
You were seen in the emergency room today for evaluation of abdominal pain.  Your testing was overall reassuring, but as we discussed you can sometimes have a chronic pancreatitis flare with normal blood testing.  I sent a prescription for pain medicine and nausea medicine to your pharmacy that you can take as needed.  Do not drive or operate machinery after taking the pain medicine.  Follow-up your primary care doctor for further evaluation.  Return to the ER for new or worsening symptoms.

## 2022-12-24 NOTE — ED Provider Notes (Signed)
[facility] Provider Note    Event Date/Time   First MD Initiated Contact with Patient 12/24/22 2215     (approximate)   History   Abdominal Pain   HPI  Teresa [name] is a 62 year old female presenting to the emergency department for evaluation of abdominal pain.  Patient reports that for the past few days she has had upper abdominal pain radiating from one side to the other.  Says she has had multiple episodes of pancreatitis previously and this feels identical.  She reports nausea without vomiting.  Having regular bowel movements.  Denies lower abdominal pain or chest pain.  Reports that she has episodes like this a couple times a year.  Most recent episode was in July of this year.   I did review her ER visit from that time.  She had a slightly elevated lipase at that time, otherwise reassuring lab work and was discharged with symptomatic treatment.  Similar visit in July 2023 as well.     Physical Exam   Triage Vital Signs: ED Triage Vitals  Encounter Vitals Group     BP 12/24/22 1937 (!) 166/71     Systolic BP Percentile --      Diastolic BP Percentile --      Pulse Rate 12/24/22 1937 86     Resp 12/24/22 1937 20     Temp 12/24/22 1937 99.9 F (37.7 C)     Temp Source 12/24/22 1937 Oral     SpO2 12/24/22 1937 95 %     Weight 12/24/22 1938 221 lb (100.2 kg)     Height 12/24/22 1938 5\' 2"  (1.575 m)     Head Circumference --      Peak Flow --      Pain Score 12/24/22 1938 9     Pain Loc --      Pain Education --      Exclude from Growth Chart --     Most recent vital signs: Vitals:   12/24/22 1937 12/24/22 2224  BP: (!) 166/71 (!) 140/73  Pulse: 86 72  Resp: 20 17  Temp: 99.9 F (37.7 C)   SpO2: 95% 100%     General: Awake, interactive  CV:  Regular rate, good peripheral perfusion.  Resp:  Lungs clear, unlabored respirations.  Abd:  Soft, nondistended, mild tenderness in the upper abdomen, remainder of abdomen nontender.  No  rebound or guarding. Neuro:  Symmetric facial movement, fluid speech   ED Results / Procedures / Treatments   Labs (all labs ordered are listed, but only abnormal results are displayed) Labs Reviewed  COMPREHENSIVE METABOLIC PANEL - Abnormal; Notable for the following components:      Result Value   Potassium 3.3 (*)    Glucose, Bld 134 (*)    AST 14 (*)    All other components within normal limits  CBC - Abnormal; Notable for the following components:   WBC 10.9 (*)    RBC 3.69 (*)    Hemoglobin 10.7 (*)    HCT 34.5 (*)    All other components within normal limits  LIPASE, BLOOD  URINALYSIS, ROUTINE W REFLEX MICROSCOPIC  CBG MONITORING, ED     EKG EKG independently reviewed interpreted by myself (ER attending) demonstrates:  EKG demonstrates normal sinus rhythm at a rate of 81, PR 158, QRS 86, QTc 436, no acute ST changes  RADIOLOGY Imaging independently reviewed and interpreted by myself demonstrates:    PROCEDURES:  Critical Care  performed: No  Procedures   MEDICATIONS ORDERED IN ED: Medications  ondansetron (ZOFRAN-ODT) disintegrating tablet 4 mg (4 mg Oral Given 12/24/22 1941)  sodium chloride 0.9 % bolus 1,000 mL (1,000 mLs Intravenous New Bag/Given 12/24/22 2249)  alum & mag hydroxide-simeth (MAALOX/MYLANTA) 200-200-20 MG/5ML suspension 30 mL (30 mLs Oral Given 12/24/22 2247)  oxyCODONE-acetaminophen (PERCOCET/ROXICET) 5-325 MG per tablet 1 tablet (1 tablet Oral Given 12/24/22 2247)     IMPRESSION / MDM / ASSESSMENT AND PLAN / ED COURSE  I reviewed the triage vital signs and the nursing notes.  Differential diagnosis includes, but is not limited to, pancreatitis flare, gastritis, IBS, overall lower suspicion for significant acute intra-abdominal process given reassuring abdominal exam and multiple prior evaluations for similar symptoms without alternative etiology  Patient's presentation is most consistent with acute presentation with potential threat to  life or bodily function.  62 year old female presenting with abdominal pain. Labs from triage with mild leukocytosis, stable anemia.  Lipase at upper limit of normal at 48.  CMP with mild hypokalemia.  I did discuss the possibility of a repeat CT imaging.  Patient reports this feels identical to her prior episodes.  Through shared decision making, she did wish to hold off on CT which I do think is reasonable.  Patient treated symptomatically with IV fluids, Zofran, GI cocktail, and pain control.  On reevaluation, patient does report some improvement in her symptoms.  Consideration for possible chronic pancreatitis based on clinical history.  Patient is comfortable with discharge home with symptomatic treatment.  Strict return precautions provided.  Patient discharged in stable condition.     FINAL CLINICAL IMPRESSION(S) / ED DIAGNOSES   Final diagnoses:  Pain of upper abdomen     Rx / DC Orders   ED Discharge Orders          Ordered    oxyCODONE-acetaminophen (PERCOCET) 5-325 MG tablet  Every 6 hours PRN        12/24/22 2337    ondansetron (ZOFRAN) 4 MG tablet  Every 6 hours PRN        12/24/22 2337             Note:  This document was prepared using Dragon voice recognition software and may include unintentional dictation errors.   Trinna Post, MD 12/24/22 [phone]

## 2022-12-24 NOTE — ED Triage Notes (Signed)
Pt presents to the from home with complaints with abdominal pain LUQ that radiates to her back and RUQ region. Pt reports history of pancreatitis and was seen for the same about 2 weeks ago. Pt reports nausea but denies vomiting. Pt talks in complete sentences. Pt reports last BM this morning.

## 2022-12-24 NOTE — ED Notes (Signed)
Pt requested a CBG check. Results were 92.

## 2022-12-25 DIAGNOSIS — R1010 Upper abdominal pain, unspecified: Secondary | ICD-10-CM | POA: Diagnosis not present

## 2022-12-25 LAB — CBG MONITORING, ED
Glucose-Capillary: 174 mg/dL — ABNORMAL HIGH (ref 70–99)
Glucose-Capillary: 36 mg/dL — CL (ref 70–99)
Glucose-Capillary: 74 mg/dL (ref 70–99)

## 2022-12-25 NOTE — ED Notes (Signed)
Bg improved, md aware.  Pt okay to discharge.

## 2022-12-25 NOTE — ED Notes (Addendum)
The patient states she does not feel well.  Requested blood glucose check.  Bg 36 - MD aware. Offered food and drink.

## 2023-02-03 ENCOUNTER — Ambulatory Visit (INDEPENDENT_AMBULATORY_CARE_PROVIDER_SITE_OTHER): Payer: 59 | Admitting: Podiatry

## 2023-02-03 DIAGNOSIS — Z91199 Patient's noncompliance with other medical treatment and regimen due to unspecified reason: Secondary | ICD-10-CM

## 2023-02-03 NOTE — Progress Notes (Signed)
1. No-show for appointment     

## 2023-02-20 ENCOUNTER — Ambulatory Visit: Payer: 59 | Admitting: Podiatry

## 2023-03-16 ENCOUNTER — Ambulatory Visit (INDEPENDENT_AMBULATORY_CARE_PROVIDER_SITE_OTHER): Payer: 59 | Admitting: Podiatry

## 2023-03-16 ENCOUNTER — Encounter: Payer: Self-pay | Admitting: Podiatry

## 2023-03-16 DIAGNOSIS — B351 Tinea unguium: Secondary | ICD-10-CM | POA: Diagnosis not present

## 2023-03-16 DIAGNOSIS — M79675 Pain in left toe(s): Secondary | ICD-10-CM | POA: Diagnosis not present

## 2023-03-16 DIAGNOSIS — E1142 Type 2 diabetes mellitus with diabetic polyneuropathy: Secondary | ICD-10-CM | POA: Diagnosis not present

## 2023-03-16 DIAGNOSIS — M79674 Pain in right toe(s): Secondary | ICD-10-CM

## 2023-03-19 ENCOUNTER — Encounter: Payer: Self-pay | Admitting: Podiatry

## 2023-03-19 NOTE — Progress Notes (Signed)
  Subjective:  Patient ID: Teresa [name], female    DOB: [birth year],  MRN: [id number]  62 y.o. female presents to clinic with  at risk foot care with history of diabetic neuropathy and painful thick toenails that are difficult to trim. Pain interferes with ambulation. Aggravating factors include wearing enclosed shoe gear. Pain is relieved with periodic professional debridement.  Chief Complaint  Patient presents with   Diabetes    "Cut my toenails."     New problem(s): None   PCP is Enid Baas, MD.  Allergies  Allergen Reactions   Other Swelling   Penicillins Other (See Comments), Hives, Rash and Shortness Of Breath    Body shakes Body shakes nervousness    Sulfa Antibiotics Shortness Of Breath   Amoxicillin Other (See Comments)    Body shakes   Duloxetine    Morphine Other (See Comments)    Hallucinations Hallucinations   Review of Systems: Negative except as noted in the HPI.   Objective:  Teresa [name] is a pleasant 62 y.o. female obese in NAD.Marland Kitchen AAO x 3.  Vascular Examination: Vascular status intact b/l with palpable pedal pulses. CFT immediate b/l. No edema. No pain with calf compression b/l. Skin temperature gradient WNL b/l.   Neurological Examination: Sensation grossly intact b/l with 10 gram monofilament. Vibratory sensation intact b/l. Pt has subjective symptoms of neuropathy.  Dermatological Examination: Pedal skin with normal turgor, texture and tone b/l. Toenails 1-5 b/l thick, discolored, elongated with subungual debris and pain on dorsal palpation. No hyperkeratotic lesions noted b/l.   Musculoskeletal Examination: Muscle strength 5/5 to b/l LE. No pain, crepitus or joint limitation noted with ROM bilateral LE. No gross bony deformities bilaterally.  Radiographs: None  Last A1c:       No data to display           Assessment:   1. Pain due to onychomycosis of toenails of both feet   2. Type 2 diabetes mellitus with diabetic  polyneuropathy, without long-term current use of insulin (HCC)     Plan:  Patient was evaluated and treated. All patient's and/or POA's questions/concerns addressed on today's visit. Mycotic toenails 1-5 debrided in length and girth without incident. Continue soft, supportive shoe gear daily. Report any pedal injuries to medical professional. Call office if there are any quesitons/concerns. -Continue foot and shoe inspections daily. Monitor blood glucose per PCP/Endocrinologist's recommendations. -Patient/POA to call should there be question/concern in the interim.  Return in about 3 months (around 06/14/2023).  Freddie Breech, DPM      [facility] LOCATION: [address], [place] [address]                   Office [phone]   [facility] LOCATION: [address] [place], [place] [address] Office [phone]

## 2023-05-31 ENCOUNTER — Other Ambulatory Visit: Payer: Self-pay | Admitting: Internal Medicine

## 2023-05-31 DIAGNOSIS — Z1231 Encounter for screening mammogram for malignant neoplasm of breast: Secondary | ICD-10-CM

## 2023-06-02 ENCOUNTER — Other Ambulatory Visit: Payer: Self-pay | Admitting: Internal Medicine

## 2023-06-02 DIAGNOSIS — R140 Abdominal distension (gaseous): Secondary | ICD-10-CM

## 2023-06-07 ENCOUNTER — Ambulatory Visit
Admission: RE | Admit: 2023-06-07 | Discharge: 2023-06-07 | Disposition: A | Discharge status: Home / Self Care | Payer: 59 | Source: Ambulatory Visit | Attending: Internal Medicine | Admitting: Internal Medicine

## 2023-06-07 ENCOUNTER — Ambulatory Visit: Payer: 59

## 2023-06-07 DIAGNOSIS — R140 Abdominal distension (gaseous): Secondary | ICD-10-CM | POA: Insufficient documentation

## 2023-06-15 ENCOUNTER — Ambulatory Visit (INDEPENDENT_AMBULATORY_CARE_PROVIDER_SITE_OTHER): Payer: 59 | Admitting: Podiatry

## 2023-06-15 DIAGNOSIS — Z91199 Patient's noncompliance with other medical treatment and regimen due to unspecified reason: Secondary | ICD-10-CM

## 2023-06-15 NOTE — Progress Notes (Signed)
 1. No-show for appointment

## 2023-06-19 LAB — EXTERNAL GENERIC LAB PROCEDURE: COLOGUARD: NEGATIVE

## 2023-06-19 LAB — COLOGUARD: COLOGUARD: NEGATIVE

## 2023-07-17 ENCOUNTER — Ambulatory Visit (INDEPENDENT_AMBULATORY_CARE_PROVIDER_SITE_OTHER): Admitting: Podiatry

## 2023-07-17 DIAGNOSIS — Z91198 Patient's noncompliance with other medical treatment and regimen for other reason: Secondary | ICD-10-CM

## 2023-07-17 NOTE — Progress Notes (Signed)
 1. Failure to attend appointment with reason given    Patient not feeling well. Will call back t reschedule.

## 2023-09-02 ENCOUNTER — Emergency Department

## 2023-09-02 ENCOUNTER — Emergency Department
Admission: EM | Admit: 2023-09-02 | Discharge: 2023-09-02 | Disposition: A | Discharge status: Home / Self Care | Attending: Emergency Medicine | Admitting: Emergency Medicine

## 2023-09-02 ENCOUNTER — Other Ambulatory Visit: Payer: Self-pay

## 2023-09-02 DIAGNOSIS — E119 Type 2 diabetes mellitus without complications: Secondary | ICD-10-CM | POA: Diagnosis not present

## 2023-09-02 DIAGNOSIS — R109 Unspecified abdominal pain: Secondary | ICD-10-CM | POA: Diagnosis present

## 2023-09-02 LAB — URINALYSIS, ROUTINE W REFLEX MICROSCOPIC
Bilirubin Urine: NEGATIVE
Glucose, UA: NEGATIVE mg/dL
Hgb urine dipstick: NEGATIVE
Ketones, ur: NEGATIVE mg/dL
Leukocytes,Ua: NEGATIVE
Nitrite: NEGATIVE
Protein, ur: NEGATIVE mg/dL
Specific Gravity, Urine: 1.004 — ABNORMAL LOW (ref 1.005–1.030)
pH: 7 (ref 5.0–8.0)

## 2023-09-02 LAB — COMPREHENSIVE METABOLIC PANEL WITH GFR
ALT: 11 U/L (ref 0–44)
AST: 15 U/L (ref 15–41)
Albumin: 3.9 g/dL (ref 3.5–5.0)
Alkaline Phosphatase: 55 U/L (ref 38–126)
Anion gap: 12 (ref 5–15)
BUN: 11 mg/dL (ref 8–23)
CO2: 25 mmol/L (ref 22–32)
Calcium: 9.3 mg/dL (ref 8.9–10.3)
Chloride: 103 mmol/L (ref 98–111)
Creatinine, Ser: 1 mg/dL (ref 0.44–1.00)
GFR, Estimated: >60 mL/min (ref >60)
Glucose, Bld: 81 mg/dL (ref 70–99)
Potassium: 3.7 mmol/L (ref 3.5–5.1)
Sodium: 140 mmol/L (ref 135–145)
Total Bilirubin: 0.4 mg/dL (ref 0.0–1.2)
Total Protein: 7.6 g/dL (ref 6.5–8.1)

## 2023-09-02 LAB — CBC WITH DIFFERENTIAL/PLATELET
Abs Immature Granulocytes: 0.08 10*3/uL — ABNORMAL HIGH (ref 0.00–0.07)
Basophils Absolute: 0 10*3/uL (ref 0.0–0.1)
Basophils Relative: 0 %
Eosinophils Absolute: 0.2 10*3/uL (ref 0.0–0.5)
Eosinophils Relative: 2 %
HCT: 37.4 % (ref 36.0–46.0)
Hemoglobin: 11.5 g/dL — ABNORMAL LOW (ref 12.0–15.0)
Immature Granulocytes: 1 %
Lymphocytes Relative: 22 %
Lymphs Abs: 2.1 10*3/uL (ref 0.7–4.0)
MCH: 28.3 pg (ref 26.0–34.0)
MCHC: 30.7 g/dL (ref 30.0–36.0)
MCV: 91.9 fL (ref 80.0–100.0)
Monocytes Absolute: 0.5 10*3/uL (ref 0.1–1.0)
Monocytes Relative: 6 %
Neutro Abs: 6.5 10*3/uL (ref 1.7–7.7)
Neutrophils Relative %: 69 %
Platelets: 374 10*3/uL (ref 150–400)
RBC: 4.07 MIL/uL (ref 3.87–5.11)
RDW: 13.3 % (ref 11.5–15.5)
WBC: 9.4 10*3/uL (ref 4.0–10.5)
nRBC: 0 % (ref 0.0–0.2)

## 2023-09-02 LAB — LIPASE, BLOOD: Lipase: 35 U/L (ref 11–51)

## 2023-09-02 LAB — TROPONIN I (HIGH SENSITIVITY): Troponin I (High Sensitivity): 4 ng/L (ref <18)

## 2023-09-02 MED ORDER — CYCLOBENZAPRINE HCL 5 MG PO TABS: 5.0000 mg | ORAL_TABLET | Freq: Three times a day (TID) | ORAL | 0 refills | Status: AC | PRN

## 2023-09-02 MED ORDER — LIDOCAINE 5 % EX PTCH: 1.0000 | MEDICATED_PATCH | CUTANEOUS | 0 refills | Status: AC

## 2023-09-02 MED ORDER — KETOROLAC TROMETHAMINE 15 MG/ML IJ SOLN
15.0000 mg | Freq: Once | INTRAMUSCULAR | Status: AC
  Administered 2023-09-02: 15 mg via INTRAVENOUS
  Filled 2023-09-02: qty 1

## 2023-09-02 MED ORDER — HYDROMORPHONE HCL 1 MG/ML IJ SOLN
0.5000 mg | Freq: Once | INTRAMUSCULAR | Status: AC
  Administered 2023-09-02: 0.5 mg via INTRAVENOUS
  Filled 2023-09-02: qty 0.5

## 2023-09-02 MED ORDER — IOHEXOL 300 MG/ML  SOLN
100.0000 mL | Freq: Once | INTRAMUSCULAR | Status: AC | PRN
  Administered 2023-09-02: 100 mL via INTRAVENOUS

## 2023-09-02 MED ORDER — ONDANSETRON HCL 4 MG/2ML IJ SOLN
4.0000 mg | Freq: Once | INTRAMUSCULAR | Status: AC
  Administered 2023-09-02: 4 mg via INTRAVENOUS
  Filled 2023-09-02: qty 2

## 2023-09-02 MED ORDER — LIDOCAINE 5 % EX PTCH
1.0000 | MEDICATED_PATCH | CUTANEOUS | Status: DC
  Administered 2023-09-02: 1 via TRANSDERMAL
  Filled 2023-09-02: qty 1

## 2023-09-02 MED ORDER — SODIUM CHLORIDE 0.9 % IV BOLUS
1000.0000 mL | Freq: Once | INTRAVENOUS | Status: AC
  Administered 2023-09-02: 1000 mL via INTRAVENOUS

## 2023-09-02 NOTE — ED Provider Notes (Addendum)
 [facility] Provider Note    Event Date/Time   First MD Initiated Contact with Patient 09/02/23 1030     (approximate)   History   Flank Pain   HPI  Teresa [name] is a 63 y.o. female with history of pancreatitis, pessary drain, diabetes who comes in with concerns for abdominal pain.  Patient reports 1 week of left-sided abdominal pain radiating into her left lower abdomen.  She denies any chest pain or shortness of breath.  She reports being on 2 L of oxygen at baseline but reports being stable from that standpoint.  She states that this is not entirely feel like when she has had pancreatitis before.  She does report a little bit of decreased p.o. intake but does report recent only having some teeth removed and so she thinks it could just be related to that.  Physical Exam   Triage Vital Signs: ED Triage Vitals  Encounter Vitals Group     BP 09/02/23 1022 (!) 143/72     Systolic BP Percentile --      Diastolic BP Percentile --      Pulse Rate 09/02/23 1022 88     Resp 09/02/23 1022 16     Temp 09/02/23 1022 98.4 F (36.9 C)     Temp Source 09/02/23 1022 Oral     SpO2 09/02/23 1022 100 %     Weight 09/02/23 1022 220 lb 14.4 oz (100.2 kg)     Height --      Head Circumference --      Peak Flow --      Pain Score 09/02/23 1021 8     Pain Loc --      Pain Education --      Exclude from Growth Chart --     Most recent vital signs: Vitals:   09/02/23 1022  BP: (!) 143/72  Pulse: 88  Resp: 16  Temp: 98.4 F (36.9 C)  SpO2: 100%     General: Awake, no distress.  CV:  Good peripheral perfusion.  Resp:  Normal effort.  Abd:  No distention.  Tender on the left side of her abdomen without any rebound, guarding Other:  Equal strength in both legs.  She does report some pain in her back with lifting up the left leg.  She is got sensation intact. Able to flex and extend the toes.  She denies any saddle anesthesia or urination or defication  concerns    ED Results / Procedures / Treatments   Labs (all labs ordered are listed, but only abnormal results are displayed) Labs Reviewed  CBC WITH DIFFERENTIAL/PLATELET  COMPREHENSIVE METABOLIC PANEL WITH GFR  LIPASE, BLOOD  URINALYSIS, ROUTINE W REFLEX MICROSCOPIC  TROPONIN I (HIGH SENSITIVITY)     EKG  My interpretation of EKG:  Normal sinus rate of 79 without any ST elevation or T wave inversions, normal intervals  RADIOLOGY I have reviewed the ct personally and interpreted no kidney stones    PROCEDURES:  Critical Care performed: No  .1-3 Lead EKG Interpretation  Performed by: Lubertha Rush, MD Authorized by: Lubertha Rush, MD     Interpretation: normal     ECG rate:  80   ECG rate assessment: normal     Rhythm: sinus rhythm     Ectopy: none     Conduction: normal      MEDICATIONS ORDERED IN ED: Medications  HYDROmorphone (DILAUDID) injection 0.5 mg (has no administration in time  range)  ondansetron  (ZOFRAN ) injection 4 mg (has no administration in time range)     IMPRESSION / MDM / ASSESSMENT AND PLAN / ED COURSE  I reviewed the triage vital signs and the nursing notes.   Patient's presentation is most consistent with acute presentation with potential threat to life or bodily function.    Will treat patient with some Dilaudid, Zofran .  Will get workup to evaluate for ACS, pancreatitis and CT scan to evaluate for diverticulitis, obstruction, perforation or other acute pathology She denies any shortness of breath, chest pain but I did get an EKG, cardiac markers given the pain is in the left upper abdomen to ensure no ACS equivalent.  She denies any new shortness of breath to suggest pulmonary embolism and she is on her baseline 2 L of oxygen.  Urine without evidence of UTI CBC shows stable hemoglobin.  CMP shows normal LFTs lipase is normal. Trop negative and symptoms for a few days  IMPRESSION: 1. No acute abnormality related to the clinical  history of abdominal pain and pancreatitis. 2. Stable ventral hernias containing fat without bowel.  Reevaluated patient.  We discussed reassuring CT scan and if she reports that her pain is worse with movement.  I do suspect that this is most likely musculoskeletal.  She had no CTL spine tenderness.  Discussed no signs of cord compression we discussed rectal exam but patient declined as she has no other signs of cord compression.  No bladder retention noted on CT imaging.  I suspect this is all musculoskeletal in nature we discussed lidocaine  patches, ibuprofen, muscle relaxers and follow-up outpatient with orthopedics.  She expressed understanding and felt comfortable with discharge home.   The patient is on the cardiac monitor to evaluate for evidence of arrhythmia and/or significant heart rate changes.      FINAL CLINICAL IMPRESSION(S) / ED DIAGNOSES   Final diagnoses:  Flank pain     Rx / DC Orders   ED Discharge Orders          Ordered    lidocaine  (LIDODERM ) 5 %  Every 24 hours        09/02/23 1348    cyclobenzaprine (FLEXERIL) 5 MG tablet  3 times daily PRN        09/02/23 1348             Note:  This document was prepared using Dragon voice recognition software and may include unintentional dictation errors.   Lubertha Rush, MD 09/02/23 1349    Lubertha Rush, MD 09/02/23 1350

## 2023-09-02 NOTE — Discharge Instructions (Addendum)
 Take Tylenol  1 g every 8 hours with ibuprofen 600 every 8 hours with food for 1 week to help with pain.  Muscle relaxers but do not drive or work while on these and the pain patches.  Please follow-up with orthopedics to discuss your pain I suspect however this is most likely musculoskeletal given your reassuring CT scan   IMPRESSION: 1. No acute abnormality related to the clinical history of abdominal pain and pancreatitis. 2. Stable ventral hernias containing fat without bowel.

## 2023-09-02 NOTE — ED Triage Notes (Signed)
 Arrives with C/O left side pain x 1 week.  Also c/o nausea. Has history of pancreatitis.  Also some diarrhea.

## 2023-12-23 ENCOUNTER — Other Ambulatory Visit: Payer: Self-pay

## 2023-12-23 ENCOUNTER — Emergency Department
Admission: EM | Admit: 2023-12-23 | Discharge: 2023-12-23 | Disposition: A | Discharge status: Home / Self Care | Attending: Emergency Medicine | Admitting: Emergency Medicine

## 2023-12-23 DIAGNOSIS — R7309 Other abnormal glucose: Secondary | ICD-10-CM | POA: Insufficient documentation

## 2023-12-23 DIAGNOSIS — M5442 Lumbago with sciatica, left side: Secondary | ICD-10-CM | POA: Insufficient documentation

## 2023-12-23 DIAGNOSIS — M5430 Sciatica, unspecified side: Secondary | ICD-10-CM

## 2023-12-23 DIAGNOSIS — M545 Low back pain, unspecified: Secondary | ICD-10-CM | POA: Diagnosis present

## 2023-12-23 DIAGNOSIS — R300 Dysuria: Secondary | ICD-10-CM | POA: Diagnosis not present

## 2023-12-23 DIAGNOSIS — M549 Dorsalgia, unspecified: Secondary | ICD-10-CM

## 2023-12-23 DIAGNOSIS — M5441 Lumbago with sciatica, right side: Secondary | ICD-10-CM | POA: Diagnosis not present

## 2023-12-23 LAB — URINALYSIS, ROUTINE W REFLEX MICROSCOPIC
Bilirubin Urine: NEGATIVE
Glucose, UA: NEGATIVE mg/dL
Hgb urine dipstick: NEGATIVE
Ketones, ur: NEGATIVE mg/dL
Leukocytes,Ua: NEGATIVE
Nitrite: NEGATIVE
Protein, ur: 30 mg/dL — AB
Specific Gravity, Urine: 1.015 (ref 1.005–1.030)
pH: 5 (ref 5.0–8.0)

## 2023-12-23 LAB — CBG MONITORING, ED
Glucose-Capillary: 41 mg/dL — CL (ref 70–99)
Glucose-Capillary: 73 mg/dL (ref 70–99)

## 2023-12-23 MED ORDER — ACETAMINOPHEN 500 MG PO TABS
1000.0000 mg | ORAL_TABLET | Freq: Once | ORAL | Status: AC
  Administered 2023-12-23: 1000 mg via ORAL
  Filled 2023-12-23: qty 2

## 2023-12-23 MED ORDER — IBUPROFEN 600 MG PO TABS: 600.0000 mg | ORAL_TABLET | Freq: Three times a day (TID) | ORAL | 0 refills | Status: AC | PRN

## 2023-12-23 MED ORDER — KETOROLAC TROMETHAMINE 30 MG/ML IJ SOLN
30.0000 mg | Freq: Once | INTRAMUSCULAR | Status: AC
  Administered 2023-12-23: 30 mg via INTRAMUSCULAR
  Filled 2023-12-23: qty 1

## 2023-12-23 MED ORDER — LIDOCAINE 5 % EX PTCH: 1.0000 | MEDICATED_PATCH | CUTANEOUS | 0 refills | Status: AC

## 2023-12-23 MED ORDER — LIDOCAINE 5 % EX PTCH
1.0000 | MEDICATED_PATCH | CUTANEOUS | Status: DC
  Administered 2023-12-23: 1 via TRANSDERMAL
  Filled 2023-12-23: qty 1

## 2023-12-23 NOTE — Discharge Instructions (Addendum)
 [facility]  Orthopedic Clinic 0.3 mi  [address]  [id number]   Take Tylenol  1 g every 8 hours and rotate with the ibuprofen  and make sure you eat eat some food with the ibuprofen .  Do this for 1 week.  Use the lidocaine  patches to help with pain.  Please follow-up with [facility] to discuss your back pain.  You may need outpatient MRI to ensure everything looks good with your prior surgery.  If you develop any difficulties with urinating, numbness in your rectum area, weakness in 1 leg or any other concerns you should return to the ER for repeat evaluation.

## 2023-12-23 NOTE — ED Notes (Signed)
 Dr Ernest informed. Pt given food to raise glucose.

## 2023-12-23 NOTE — ED Triage Notes (Signed)
 Pt complains of sciatic pain on both sides of lower back radiates down both legs and around to groin. Pt has history of sciatic and this feels same. Pain has been going on for 2 weeks. OTC tylenol , lidocaine  patches and creams used with no relief. Denies any bowel or bladder issues. Denies any falls.

## 2023-12-23 NOTE — ED Provider Notes (Signed)
 [facility] Provider Note    Event Date/Time   First MD Initiated Contact with Patient 12/23/23 1350     (approximate)   History   Back Pain   HPI  Teresa [name] is a 63 y.o. female who comes in with low back pain rating down both legs.  Reports a history of sciatica and this feels similar.  Pains been going on for 2 weeks.  No falls.  Patient reports that the pain she had had previously years ago and then she had a lumbar surgery at [facility].  She reports that the pain is in her lower back area but could have rotates what side it is and then radiates down into her groins and down her legs.  She denies any urinating or defecating on herself or any other concerns.  She denies any chest pain, shortness of breath.  She reports taking Tylenol , lidocaine  patches and creams without any relief.  On review of records I have seen patient previously in the ER for some flank pain and gotten CT imaging that has been reassuring.  She has known ventral hernia.  No evidence of any aortic aneurysm.   Physical Exam   Triage Vital Signs: ED Triage Vitals [12/23/23 1250]  Encounter Vitals Group     BP 130/65     Girls Systolic BP Percentile      Girls Diastolic BP Percentile      Boys Systolic BP Percentile      Boys Diastolic BP Percentile      Pulse Rate 97     Resp 18     Temp 98.9 F (37.2 C)     Temp Source Oral     SpO2 97 %     Weight 220 lb 7.4 oz (100 kg)     Height 5' 2 (1.575 m)     Head Circumference      Peak Flow      Pain Score      Pain Loc      Pain Education      Exclude from Growth Chart     Most recent vital signs: Vitals:   12/23/23 1250  BP: 130/65  Pulse: 97  Resp: 18  Temp: 98.9 F (37.2 C)  SpO2: 97%     General: Awake, no distress.  CV:  Good peripheral perfusion.  Resp:  Normal effort.  Abd:  No distention.  Other:  Patient is able to lift up both legs.  Sensation intact bilaterally able to plantarflex and dorsiflex.  No  saddle anesthesia rectal tone is normal.  Abdomen is soft and nontender.   ED Results / Procedures / Treatments   Labs (all labs ordered are listed, but only abnormal results are displayed) Labs Reviewed  URINALYSIS, ROUTINE W REFLEX MICROSCOPIC - Abnormal; Notable for the following components:      Result Value   Color, Urine YELLOW (*)    APPearance CLOUDY (*)    Protein, ur 30 (*)    Bacteria, UA MANY (*)    All other components within normal limits  CBG MONITORING, ED - Abnormal; Notable for the following components:   Glucose-Capillary 41 (*)    All other components within normal limits  URINE CULTURE  CBG MONITORING, ED       RADIOLOGY none   PROCEDURES:  Critical Care performed: No  Procedures   MEDICATIONS ORDERED IN ED: Medications  lidocaine  (LIDODERM ) 5 % 1 patch (1 patch Transdermal Patch Applied 12/23/23  1437)  ketorolac  (TORADOL ) 30 MG/ML injection 30 mg (30 mg Intramuscular Given 12/23/23 1436)  acetaminophen  (TYLENOL ) tablet 1,000 mg (1,000 mg Oral Given 12/23/23 1435)     IMPRESSION / MDM / ASSESSMENT AND PLAN / ED COURSE  I reviewed the triage vital signs and the nursing notes.   Patient's presentation is most consistent with acute, uncomplicated illness.    Patient comes in with intermittent back pain.  This seems most consistent with musculoskeletal pain versus known sciatic pain.  No evidence of cord compression on examination.  Will treat patient with Tylenol , Toradol , lidocaine  patch.  No indication for x-rays today as she denies any recent falls.  She has had CT imaging of her abdomen that showed a prior L4-L5 posterior lumbar interbody fusion.  Discussed with patient that she will need to follow-up with [facility] for outpatient MRI but no indication today for inpatient MRI due to no evidence of cord compression.  We discussed control of her pain and following up with [facility] or her [facility] doctor.  She expressed understanding and felt  comfortable with this plan.  Patient did receive a urine sample without any RBCs to suggest kidney stones and her urine does not have any obvious signs for infection she has got significant of squamous cells on it which she is going to make it not a great sample she denies any obvious symptoms at this time will send for urine culture given it is a little bit difficult to interpret with the many bacteria but if it does grow something they can call patient to see if she is having any symptoms at that time.  Postvoid bladder scan was normal.  4:26 PM repeat evaluation patient is feeling much improved she is ambulatory around the room.  Her abdomen is soft nontender she denies any symptoms.  She does report that her glucose meter is reading low and she has not really eaten anything today and she is on insulin  and her sugars are read as low.  We did recheck it here and her sugars were 40.  Patient was given thing to eat and a recheck sugar was reassuring.  Discussed with patient continue to monitor her sugars but she states that she just thinks this was related to not eating a lot had been here in the emergency room.   Repeat sugar was 73 and she is asymptomatic.  Patient reports that she can to continue to monitor her sugars at home and will eat more food when she gets home.  She is comfortable with discharge home.  Reports that she will get some pain out of the bed imaging before she drives home and she denies any symptoms.    FINAL CLINICAL IMPRESSION(S) / ED DIAGNOSES   Final diagnoses:  Back pain, unspecified back location, unspecified back pain laterality, unspecified chronicity  Sciatica, unspecified laterality     Rx / DC Orders   ED Discharge Orders          Ordered    ibuprofen  (ADVIL ) 600 MG tablet  Every 8 hours PRN        12/23/23 1628    lidocaine  (LIDODERM ) 5 %  Every 24 hours        12/23/23 1628             Note:  This document was prepared using Dragon voice recognition  software and may include unintentional dictation errors.   Ernest Ronal BRAVO, MD 12/23/23 [phone]

## 2023-12-24 LAB — URINE CULTURE: Culture: <10000 — AB

## 2024-01-18 ENCOUNTER — Encounter: Payer: Self-pay | Admitting: Podiatry

## 2024-01-18 ENCOUNTER — Ambulatory Visit (INDEPENDENT_AMBULATORY_CARE_PROVIDER_SITE_OTHER): Admitting: Podiatry

## 2024-01-18 DIAGNOSIS — B351 Tinea unguium: Secondary | ICD-10-CM

## 2024-01-18 DIAGNOSIS — M79675 Pain in left toe(s): Secondary | ICD-10-CM

## 2024-01-18 DIAGNOSIS — M79674 Pain in right toe(s): Secondary | ICD-10-CM

## 2024-01-18 DIAGNOSIS — E1142 Type 2 diabetes mellitus with diabetic polyneuropathy: Secondary | ICD-10-CM | POA: Diagnosis not present

## 2024-01-25 NOTE — Progress Notes (Signed)
  Subjective:  Patient ID: Teresa [name], female    DOB: May 15, 1960,  MRN: [id number]  Teresa [name] presents to clinic today for at risk foot care with history of diabetic neuropathy and painful, discolored, thick toenails which interfere with daily activities  Chief Complaint  Patient presents with   Toe Pain    Diabetic foot care. Dr. Sherial is her PCP. Her last A1c was 5.4. She has an appointment with her PCP next week   New problem(s): None.   PCP is Sherial Bail, MD. Teresa [name] 10/27/2023.  Allergies  Allergen Reactions   Other Swelling   Penicillins Other (See Comments), Hives, Rash and Shortness Of Breath    Body shakes Body shakes nervousness    Sulfa Antibiotics Shortness Of Breath   Amoxicillin Other (See Comments)    Body shakes   Duloxetine    Morphine Other (See Comments)    Hallucinations Hallucinations    Review of Systems: Negative except as noted in the HPI.  Objective: No changes noted in today's physical examination. There were no vitals filed for this visit. Teresa [name] is a pleasant 63 y.o. female in NAD. AAO x 3.   Diabetic foot exam was performed with the following findings:   No deformities, ulcerations, or other skin breakdown Normal sensation of 10g monofilament Intact posterior tibialis and dorsalis pedis pulses Vascular Examination: Capillary refill time immediate b/l. Palpable pedal pulses. Pedal hair present b/l. Pedal edema absent. No pain with calf compression b/l. Skin temperature gradient WNL b/l. No cyanosis or clubbing b/l. No ischemia or gangrene noted b/l.   Neurological Examination: Sensation grossly intact b/l with 10 gram monofilament. Vibratory sensation intact b/l. Pt has subjective symptoms of neuropathy.  Dermatological Examination: Pedal skin with normal turgor, texture and tone b/l.  No open wounds. No interdigital macerations.   Toenails 1-5 b/l thick, discolored, elongated with subungual debris and pain on  dorsal palpation.   No corns, calluses, nor porokeratotic lesions.  Musculoskeletal Examination: Muscle strength 5/5 to all lower extremity muscle groups bilaterally. No pain, crepitus or joint limitation noted with ROM bilateral LE. No gross bony deformities bilaterally.  Radiographs: None     Assessment/Plan: 1. Pain due to onychomycosis of toenails of both feet   2. Type 2 diabetes mellitus with diabetic polyneuropathy, without long-term current use of insulin  (HCC)      Diabetic foot examination performed today. All patient's and/or POA's questions/concerns addressed on today's visit. Toenails 1-5 debrided in length and girth without incident. Continue foot and shoe inspections daily. Monitor blood glucose per PCP/Endocrinologist's recommendations. Continue soft, supportive shoe gear daily. Report any pedal injuries to medical professional. Call office if there are any questions/concerns. -Patient/POA to call should there be question/concern in the interim.   Return in about 3 months (around 04/19/2024).  Teresa [name], DPM      [place] LOCATION: [address], [place] [address]                   Office [phone]   [facility] LOCATION: [address] [place], [place] [address] Office [phone]

## 2024-02-19 ENCOUNTER — Other Ambulatory Visit: Payer: Self-pay

## 2024-02-19 ENCOUNTER — Emergency Department
Admission: EM | Admit: 2024-02-19 | Discharge: 2024-02-20 | Disposition: A | Discharge status: Home / Self Care | Attending: Emergency Medicine | Admitting: Emergency Medicine

## 2024-02-19 DIAGNOSIS — M5442 Lumbago with sciatica, left side: Secondary | ICD-10-CM | POA: Diagnosis present

## 2024-02-19 DIAGNOSIS — M5441 Lumbago with sciatica, right side: Secondary | ICD-10-CM | POA: Insufficient documentation

## 2024-02-19 MED ORDER — ACETAMINOPHEN 500 MG PO TABS
1000.0000 mg | ORAL_TABLET | Freq: Once | ORAL | Status: AC
  Administered 2024-02-19: 1000 mg via ORAL
  Filled 2024-02-19: qty 2

## 2024-02-19 MED ORDER — KETOROLAC TROMETHAMINE 15 MG/ML IJ SOLN
30.0000 mg | Freq: Once | INTRAMUSCULAR | Status: AC
  Administered 2024-02-19: 30 mg via INTRAMUSCULAR
  Filled 2024-02-19: qty 2

## 2024-02-19 MED ORDER — LIDOCAINE 5 % EX PTCH
1.0000 | MEDICATED_PATCH | CUTANEOUS | Status: DC
  Administered 2024-02-19: 1 via TRANSDERMAL
  Filled 2024-02-19: qty 1

## 2024-02-19 MED ORDER — CYCLOBENZAPRINE HCL 10 MG PO TABS
10.0000 mg | ORAL_TABLET | Freq: Once | ORAL | Status: AC
  Administered 2024-02-19: 10 mg via ORAL
  Filled 2024-02-19: qty 1

## 2024-02-19 NOTE — ED Notes (Signed)
 ED Provider at bedside.

## 2024-02-19 NOTE — ED Triage Notes (Signed)
 Pt to ED via EMS from home, pt reports hx of sciatica and is having flare up, pt had surgery for same aprox 7 years ago.

## 2024-02-19 NOTE — ED Provider Notes (Signed)
 [facility] Provider Note    Event Date/Time   First MD Initiated Contact with Patient 02/19/24 2300     (approximate)   History   Back Pain   HPI  Teresa [name] is a 63 year old female with history of sciatica presenting to the emergency department for evaluation of low back pain.  Reports that with the weather change this exacerbates her back pain.  Worsened over the past few days.  No falls or recent trauma.  No new bowel or bladder symptoms.  No fevers.  Reports lumbar surgery about 7 years ago at [facility].   Reviewed ER visit from 12/23/2023.  Presented with low back pain with bilateral sciatica at that time.  Reassuring exam, improved with medication.  Patient reports that she did not have any leftover medication from a prior back pain, but does report that previously use medications have been effective for her.       Physical Exam   Triage Vital Signs: ED Triage Vitals  Encounter Vitals Group     BP 02/19/24 2121 (!) 151/68     Girls Systolic BP Percentile --      Girls Diastolic BP Percentile --      Boys Systolic BP Percentile --      Boys Diastolic BP Percentile --      Pulse Rate 02/19/24 2121 84     Resp 02/19/24 2121 18     Temp 02/19/24 2123 98.1 F (36.7 C)     Temp src --      SpO2 02/19/24 2121 99 %     Weight 02/19/24 2120 219 lb (99.3 kg)     Height 02/19/24 2120 5' 2 (1.575 m)     Head Circumference --      Peak Flow --      Pain Score 02/19/24 2120 8     Pain Loc --      Pain Education --      Exclude from Growth Chart --     Most recent vital signs: Vitals:   02/19/24 2123 02/20/24 0040  BP:  (!) 154/71  Pulse:  79  Resp:  20  Temp: 98.1 F (36.7 C)   SpO2:  99%     General: Awake, interactive  CV:  Good peripheral perfusion Resp:  Unlabored respirations Abd:  Nondistended.  Neuro:  Symmetric facial movement, fluid speech, 5 out of 5 strength in the bilateral lower extremities with intact sensation Other:    Tenderness to palpation throughout the lower back without specific area of midline tenderness   ED Results / Procedures / Treatments   Labs (all labs ordered are listed, but only abnormal results are displayed) Labs Reviewed - No data to display   EKG EKG independently reviewed and interpreted by myself demonstrates:    RADIOLOGY Imaging independently reviewed and interpreted by myself demonstrates:   Formal Radiology Read:  No results found.  PROCEDURES:  Critical Care performed: No  Procedures   MEDICATIONS ORDERED IN ED: Medications  lidocaine  (LIDODERM ) 5 % 1 patch (1 patch Transdermal Patch Applied 02/19/24 2331)  ketorolac  (TORADOL ) 15 MG/ML injection 30 mg (30 mg Intramuscular Given 02/19/24 2328)  acetaminophen  (TYLENOL ) tablet 1,000 mg (1,000 mg Oral Given 02/19/24 2327)  cyclobenzaprine  (FLEXERIL ) tablet 10 mg (10 mg Oral Given 02/19/24 2328)     IMPRESSION / MDM / ASSESSMENT AND PLAN / ED COURSE  I reviewed the triage vital signs and the nursing notes.  Differential diagnosis includes, but  is not limited to, musculoskeletal back pain, sciatica, no bowel or bladder symptoms, fevers, other symptoms suggestive of cauda equina or other spinal cord compression  Patient's presentation is most consistent with acute illness / injury with system symptoms.  Patient presents with low back pain with bilateral sciatica, history of similar in the past.  No bowel or bladder symptoms or other history suggestive of acute spinal cord compression.  No indication for labs or imaging.  Will treat symptomatically with multimodal pain control with Toradol , Tylenol , lidocaine  patch, and Flexeril .  Clinical Course as of 02/20/24 0538  Tue Feb 20, 2024  0021 Patient reassessed.  Reports significantly improved pain.  She is comfortable with discharge home.  Will DC with prescriptions for Naprosyn , lidocaine  patches, and Flexeril . [NR]    Clinical Course User Index [NR] Levander Slate,  MD     FINAL CLINICAL IMPRESSION(S) / ED DIAGNOSES   Final diagnoses:  Acute bilateral low back pain with bilateral sciatica     Rx / DC Orders   ED Discharge Orders          Ordered    cyclobenzaprine  (FLEXERIL ) 10 MG tablet  3 times daily PRN        02/20/24 0023    lidocaine  (LIDODERM ) 5 %  Every 24 hours        02/20/24 0023    naproxen  (NAPROSYN ) 500 MG tablet  2 times daily with meals        02/20/24 0023             Note:  This document was prepared using Dragon voice recognition software and may include unintentional dictation errors.   Levander Slate, MD 02/20/24 [phone]

## 2024-02-20 MED ORDER — NAPROXEN 500 MG PO TABS: 500.0000 mg | ORAL_TABLET | Freq: Two times a day (BID) | ORAL | 0 refills | Status: AC

## 2024-02-20 MED ORDER — CYCLOBENZAPRINE HCL 10 MG PO TABS: 10.0000 mg | ORAL_TABLET | Freq: Three times a day (TID) | ORAL | 0 refills | Status: AC | PRN

## 2024-02-20 MED ORDER — LIDOCAINE 5 % EX PTCH: 1.0000 | MEDICATED_PATCH | CUTANEOUS | 0 refills | Status: AC

## 2024-02-20 NOTE — Discharge Instructions (Addendum)
 You were seen in the emergency department today for evaluation of your back pain. Fortunately, your exam here is reassuring. You can take Tylenol  to help with your pain, unless there is another reason you should not take this.  I have also sent a prescription for an anti-inflammatory called naproxen .  I sent a prescription for lidocaine  patches, but these are also available over-the-counter and works similarly if that is cheaper.  We will send a prescription for a muscle relaxer to your pharmacy. Do not drive or operate machinery when taking this medication. Follow-up with your primary care provider within a few days for reevaluation. Return to the ER for any worsening symptoms including numbness, tingling, weakness, bowel or bladder incontinence, or any other new or concerning symptoms.

## 2024-04-08 ENCOUNTER — Other Ambulatory Visit: Payer: Self-pay | Admitting: Internal Medicine

## 2024-04-08 DIAGNOSIS — M5442 Lumbago with sciatica, left side: Secondary | ICD-10-CM

## 2024-04-08 DIAGNOSIS — G8929 Other chronic pain: Secondary | ICD-10-CM

## 2024-04-15 ENCOUNTER — Ambulatory Visit
Admission: RE | Admit: 2024-04-15 | Discharge: 2024-04-15 | Disposition: A | Discharge status: Home / Self Care | Source: Ambulatory Visit | Attending: Internal Medicine | Admitting: Internal Medicine

## 2024-04-15 DIAGNOSIS — M5442 Lumbago with sciatica, left side: Secondary | ICD-10-CM | POA: Insufficient documentation

## 2024-04-15 DIAGNOSIS — G8929 Other chronic pain: Secondary | ICD-10-CM | POA: Diagnosis present

## 2024-04-16 ENCOUNTER — Ambulatory Visit

## 2024-04-22 ENCOUNTER — Ambulatory Visit (INDEPENDENT_AMBULATORY_CARE_PROVIDER_SITE_OTHER): Admitting: Podiatry

## 2024-04-22 DIAGNOSIS — Z91198 Patient's noncompliance with other medical treatment and regimen for other reason: Secondary | ICD-10-CM

## 2024-04-22 NOTE — Progress Notes (Signed)
 1. Failure to attend appointment with reason given    Patient canceled due to back issues.

## 2024-04-30 ENCOUNTER — Other Ambulatory Visit: Payer: Self-pay

## 2024-04-30 ENCOUNTER — Emergency Department
Admission: EM | Admit: 2024-04-30 | Discharge: 2024-04-30 | Disposition: A | Discharge status: Home / Self Care | Attending: Emergency Medicine | Admitting: Emergency Medicine

## 2024-04-30 DIAGNOSIS — M549 Dorsalgia, unspecified: Secondary | ICD-10-CM | POA: Diagnosis present

## 2024-04-30 DIAGNOSIS — E119 Type 2 diabetes mellitus without complications: Secondary | ICD-10-CM | POA: Insufficient documentation

## 2024-04-30 DIAGNOSIS — J449 Chronic obstructive pulmonary disease, unspecified: Secondary | ICD-10-CM | POA: Insufficient documentation

## 2024-04-30 DIAGNOSIS — M5416 Radiculopathy, lumbar region: Secondary | ICD-10-CM | POA: Diagnosis not present

## 2024-04-30 MED ORDER — KETOROLAC TROMETHAMINE 15 MG/ML IJ SOLN
15.0000 mg | Freq: Once | INTRAMUSCULAR | Status: AC
  Administered 2024-04-30: 15 mg via INTRAMUSCULAR
  Filled 2024-04-30: qty 1

## 2024-04-30 MED ORDER — LIDOCAINE 5 % EX PTCH: 1.0000 | MEDICATED_PATCH | CUTANEOUS | 0 refills | Status: AC

## 2024-04-30 MED ORDER — LIDOCAINE 5 % EX PTCH
1.0000 | MEDICATED_PATCH | CUTANEOUS | Status: DC
  Administered 2024-04-30: 1 via TRANSDERMAL
  Filled 2024-04-30: qty 1

## 2024-04-30 MED ORDER — HYDROCODONE-ACETAMINOPHEN 5-325 MG PO TABS
1.0000 | ORAL_TABLET | Freq: Once | ORAL | Status: AC
  Administered 2024-04-30: 1 via ORAL
  Filled 2024-04-30: qty 1

## 2024-04-30 MED ORDER — HYDROCODONE-ACETAMINOPHEN 5-325 MG PO TABS: 1.0000 | ORAL_TABLET | Freq: Four times a day (QID) | ORAL | 0 refills | Status: AC | PRN

## 2024-04-30 NOTE — ED Notes (Signed)
 See triage note  Presents with lower back pain  and states pain is moving into left leg

## 2024-04-30 NOTE — ED Provider Notes (Signed)
 "  [facility] Provider Note    Event Date/Time   First MD Initiated Contact with Patient 04/30/24 1309     (approximate)   History   Back Pain   HPI  Teresa [name] is a 64 y.o. female with PMH of diabetes, COPD, obesity, degenerative disc disease, among other things listed in chart presents for evaluation of sciatic nerve pain.  Patient has been evaluated by this by her PCP and recently had an MRI.  Patient has already been prescribed tizanidine, naproxen , gabapentin.  Patient is here for pain control.  No new falls or injuries.  Patient endorses pain, numbness and tingling that radiates from her left buttock down her leg.     Physical Exam   Triage Vital Signs: ED Triage Vitals [04/30/24 1241]  Encounter Vitals Group     BP 116/75     Girls Systolic BP Percentile      Girls Diastolic BP Percentile      Boys Systolic BP Percentile      Boys Diastolic BP Percentile      Pulse Rate 87     Resp 17     Temp 98.4 F (36.9 C)     Temp Source Oral     SpO2 95 %     Weight 230 lb (104.3 kg)     Height 5' 2 (1.575 m)     Head Circumference      Peak Flow      Pain Score 7     Pain Loc      Pain Education      Exclude from Growth Chart     Most recent vital signs: Vitals:   04/30/24 1241  BP: 116/75  Pulse: 87  Resp: 17  Temp: 98.4 F (36.9 C)  SpO2: 95%   General: Awake, no distress.  CV:  Good peripheral perfusion.  RRR. Resp:  Normal effort.  CTAB Abd:  No distention.  Other:  Dorsalis pedis pulse 2+ and regular, sensation maintained in bilateral lower extremities, strength is equal in bilateral lower extremities, positive straight leg raise on the left, tender to palpation across the paraspinal muscles of the left lower back.   ED Results / Procedures / Treatments   Labs (all labs ordered are listed, but only abnormal results are displayed) Labs Reviewed - No data to display  PROCEDURES:  Critical Care performed:  No  Procedures   MEDICATIONS ORDERED IN ED: Medications  lidocaine  (LIDODERM ) 5 % 1 patch (has no administration in time range)  HYDROcodone -acetaminophen  (NORCO/VICODIN) 5-325 MG per tablet 1 tablet (has no administration in time range)  ketorolac  (TORADOL ) 15 MG/ML injection 15 mg (has no administration in time range)     IMPRESSION / MDM / ASSESSMENT AND PLAN / ED COURSE  I reviewed the triage vital signs and the nursing notes.                             64 year old female presents for evaluation of sciatic nerve pain.  Vital signs are stable patient uncomfortable appearing on exam.  Differential diagnosis includes, but is not limited to, chronic back pain, lumbar radiculopathy, muscle strain, spinal stenosis, arthritis.  Patient's presentation is most consistent with acute, uncomplicated illness.  Given the patient has not had any new falls or trauma do not feel that x-ray is indicated.  Patient did not have point tenderness over the spine.  Patient does not present  with any red flag signs or symptoms concerning for acute spinal cord compression and has actually already had an MRI so do not feel that MRI is indicated today.  I did review patient's MRI results which showed a disc bulge at L2/3 with left foraminal extrusion extending superiorly.  Suspect that this is the cause of patient's pain.  I reviewed the MRI results with the patient as she was unaware.  Will give her follow-up information for neurosurgery as patient states that her PCP had planned to send her to a neurosurgeon once they got the MRI results.  In regards to plan for patient's pain management, recommended she continue to take the naproxen  twice a day, the muscle relaxer as needed and the gabapentin as previously prescribed.  Encouraged her to also begin taking Tylenol , 650 every 6 hours.  Will give her prescription for lidocaine  patches.  Since patient's pain has not been well-managed with this combination of  medications we will give her a short course of narcotic pain medication.  I explained to patient that we cannot prescribe several days of this medication and if she needs further pain management she will need to see her PCP.  Patient made aware that she cannot drive when taking these medications.  She voiced understanding, all questions were answered and she was stable at discharge.      FINAL CLINICAL IMPRESSION(S) / ED DIAGNOSES   Final diagnoses:  Lumbar radiculopathy     Rx / DC Orders   ED Discharge Orders          Ordered    HYDROcodone -acetaminophen  (NORCO/VICODIN) 5-325 MG tablet  Every 6 hours PRN        04/30/24 1419    lidocaine  (LIDODERM ) 5 %  Every 24 hours        04/30/24 1419             Note:  This document was prepared using Dragon voice recognition software and may include unintentional dictation errors.   Cleaster Tinnie LABOR, PA-C 04/30/24 1422  "

## 2024-04-30 NOTE — ED Triage Notes (Signed)
 Pt sts that she is having lower left back pain. Pt sts that she recently had a MRI and her doctor has not told her the results of that test.

## 2024-04-30 NOTE — Discharge Instructions (Addendum)
 Please follow-up with neurosurgery whose information is attached.  Please continue to take your gabapentin, naproxen  and tizanidine as previously prescribed.  You can take 650 mg of Tylenol  every 6 hours as needed for pain. You can use ice, heat, muscle creams and other topical pain relievers as well.  I have sent lidocaine  patches to the pharmacy.  These can be worn for 12 hours at a time.  After 12 hours please remove it for 12 hours before replacing.  I have sent a strong pain medication called Norco to the pharmacy.  This medication can be taken every 4-6 hours as needed for severe or breakthrough pain.  This medication can cause dependency so only take if you are unable to control your pain with other medications.  It will make you sleepy so do not drive or operate heavy machinery after taking it.  Do not drink alcohol while taking this medication.  This medication can also cause constipation so please take an over-the-counter stool softener like MiraLAX or Colace while taking it.  This medication contains both hydrocodone  and acetaminophen .  Do not take Tylenol  at the same time.  You can take the Norco or Tylenol  but not both.

## 2024-05-09 ENCOUNTER — Ambulatory Visit: Admitting: Physician Assistant
